# Patient Record
Sex: Male | Born: 1945
Health system: Southern US, Community
[De-identification: ages and names within clinical notes are randomized; demographics above are authoritative.]

## PROBLEM LIST (undated history)

## (undated) DIAGNOSIS — F419 Anxiety disorder, unspecified: Secondary | ICD-10-CM

## (undated) DIAGNOSIS — G4700 Insomnia, unspecified: Secondary | ICD-10-CM

## (undated) DIAGNOSIS — I10 Essential (primary) hypertension: Secondary | ICD-10-CM

## (undated) DIAGNOSIS — I509 Heart failure, unspecified: Secondary | ICD-10-CM

## (undated) DIAGNOSIS — I499 Cardiac arrhythmia, unspecified: Secondary | ICD-10-CM

## (undated) DIAGNOSIS — G4730 Sleep apnea, unspecified: Secondary | ICD-10-CM

## (undated) DIAGNOSIS — E785 Hyperlipidemia, unspecified: Secondary | ICD-10-CM

## (undated) DIAGNOSIS — R0601 Orthopnea: Secondary | ICD-10-CM

## (undated) DIAGNOSIS — E119 Type 2 diabetes mellitus without complications: Secondary | ICD-10-CM

## (undated) DIAGNOSIS — E0590 Thyrotoxicosis, unspecified without thyrotoxic crisis or storm: Secondary | ICD-10-CM

## (undated) DIAGNOSIS — K219 Gastro-esophageal reflux disease without esophagitis: Secondary | ICD-10-CM

## (undated) DIAGNOSIS — Z972 Presence of dental prosthetic device (complete) (partial): Secondary | ICD-10-CM

## (undated) DIAGNOSIS — F32A Depression, unspecified: Secondary | ICD-10-CM

## (undated) DIAGNOSIS — F329 Major depressive disorder, single episode, unspecified: Secondary | ICD-10-CM

## (undated) DIAGNOSIS — G709 Myoneural disorder, unspecified: Secondary | ICD-10-CM

## (undated) DIAGNOSIS — M1990 Unspecified osteoarthritis, unspecified site: Secondary | ICD-10-CM

## (undated) HISTORY — DX: Depression, unspecified: F32.A

## (undated) HISTORY — DX: Major depressive disorder, single episode, unspecified: F32.9

## (undated) HISTORY — DX: Anxiety disorder, unspecified: F41.9

## (undated) HISTORY — DX: Insomnia, unspecified: G47.00

## (undated) HISTORY — DX: Type 2 diabetes mellitus without complications: E11.9

## (undated) HISTORY — DX: Gastro-esophageal reflux disease without esophagitis: K21.9

## (undated) HISTORY — DX: Essential (primary) hypertension: I10

## (undated) HISTORY — PX: TONSILLECTOMY: SUR1361

## (undated) HISTORY — PX: CARDIAC CATHETERIZATION: SHX172

## (undated) HISTORY — DX: Hyperlipidemia, unspecified: E78.5

---

## 2005-07-29 ENCOUNTER — Emergency Department: Payer: Self-pay | Admitting: Emergency Medicine

## 2005-07-29 ENCOUNTER — Other Ambulatory Visit: Payer: Self-pay

## 2005-07-30 IMAGING — CT CT HEAD WITHOUT CONTRAST
2 series · 16 of 30 positions shown, 20 images · non-contrast
Comparison: none

REASON FOR EXAM: Dizziness rm1
COMMENTS:

[Series 2: without · axial · non-contrast · 0.39mm/px · z∈[+144,+270]mm · 13 of 31 slices shown, 17 images]
[im 3/31  brain]
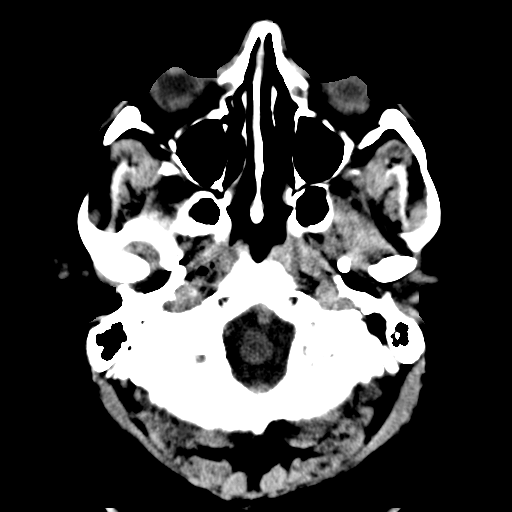
[im 3/31  bone]
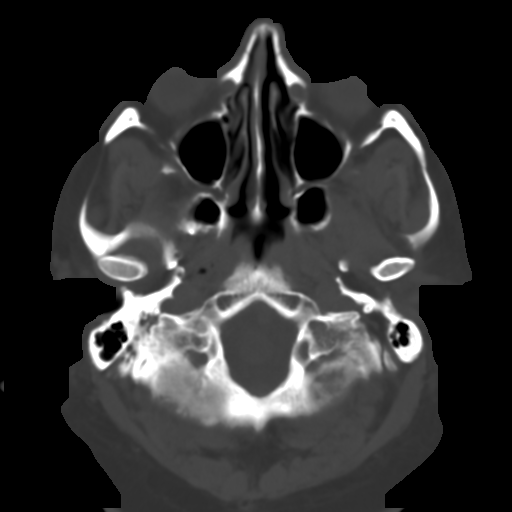
[im 5/31  brain]
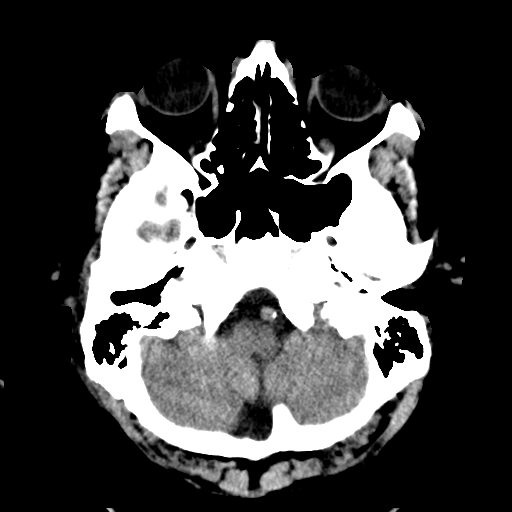
[im 7/31  brain]
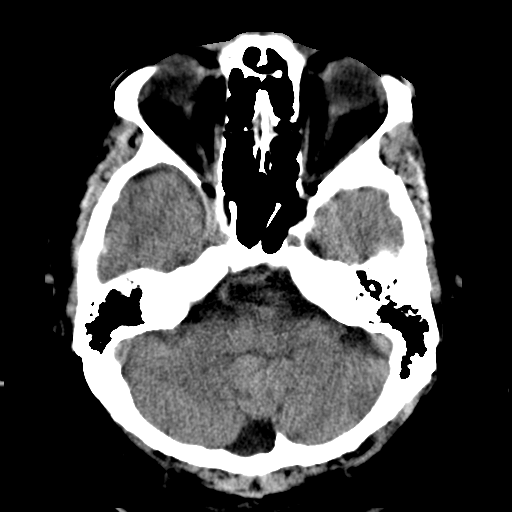
[im 9/31  brain]
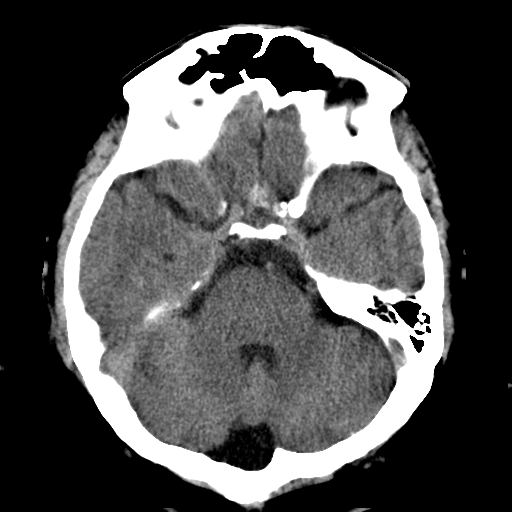
[im 11/31  brain]
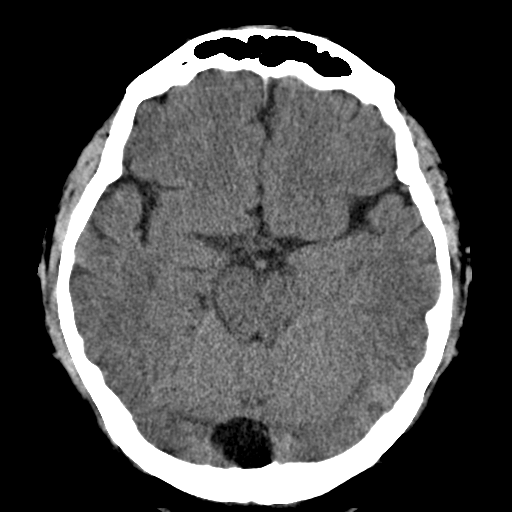
[im 11/31  bone]
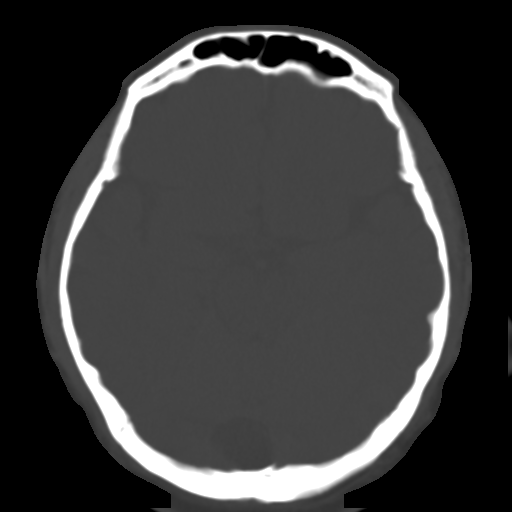
[im 13/31  brain]
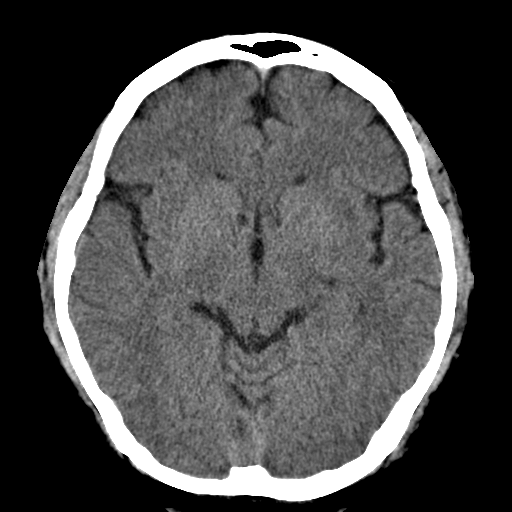
[im 16/31  brain]
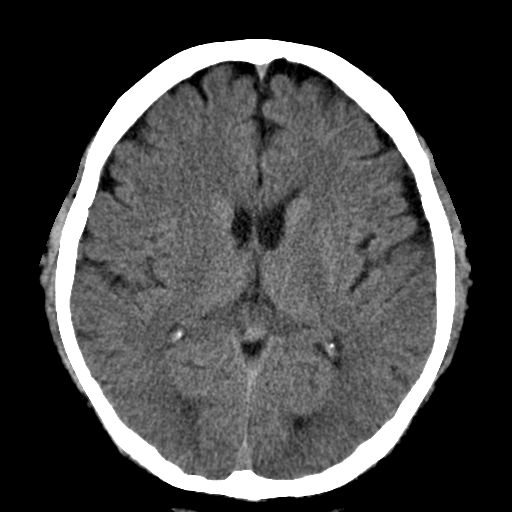
[im 18/31  brain]
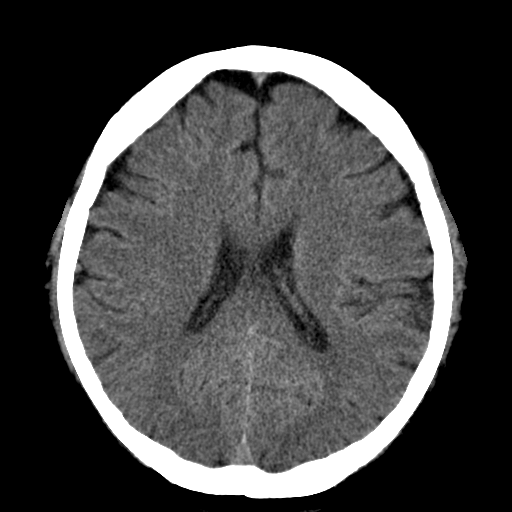
[im 20/31  brain]
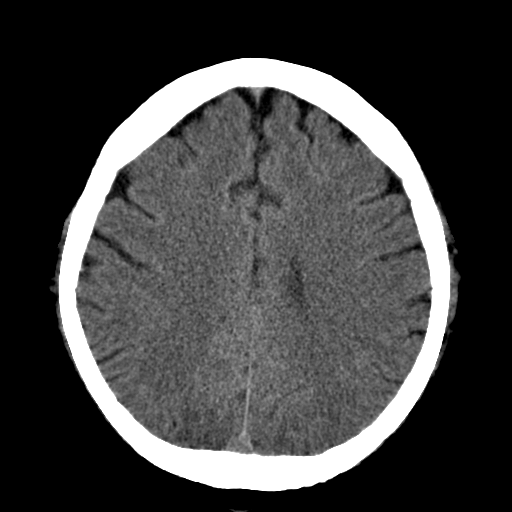
[im 20/31  bone]
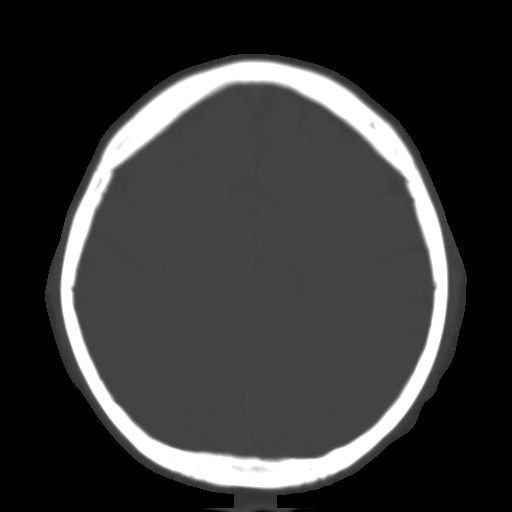
[im 22/31  brain]
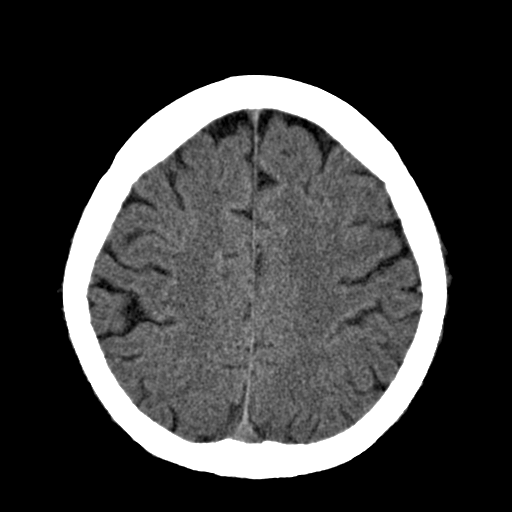
[im 24/31  brain]
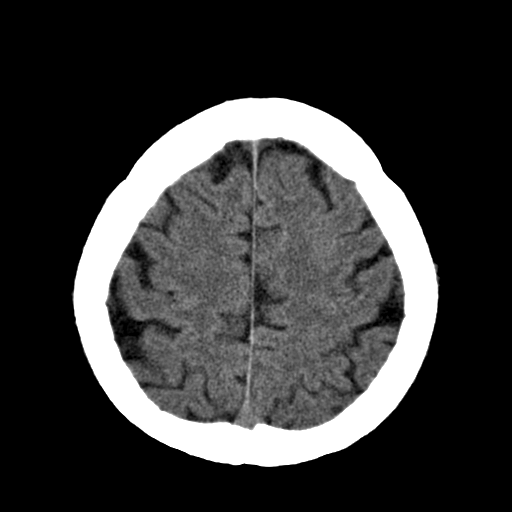
[im 26/31  brain]
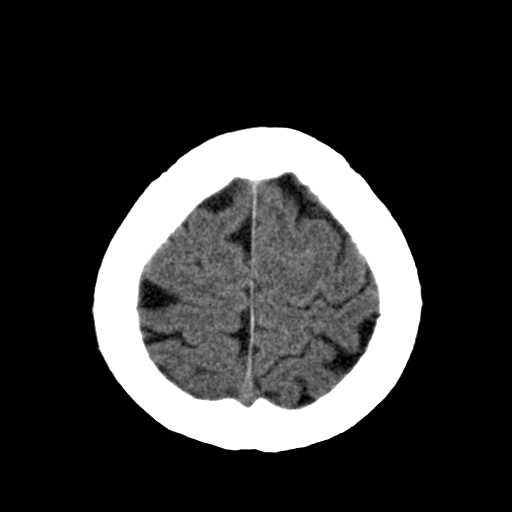
[im 28/31  brain]
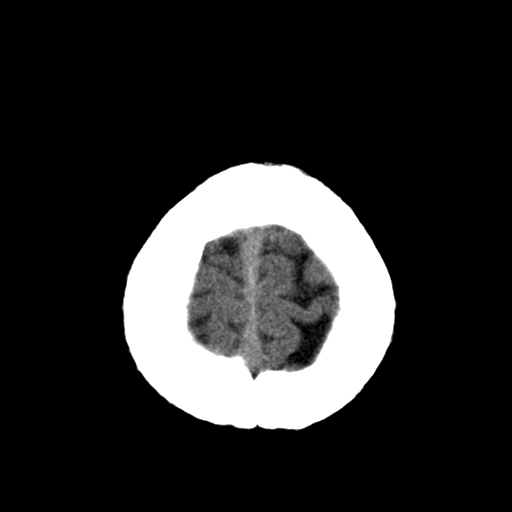
[im 28/31  bone]
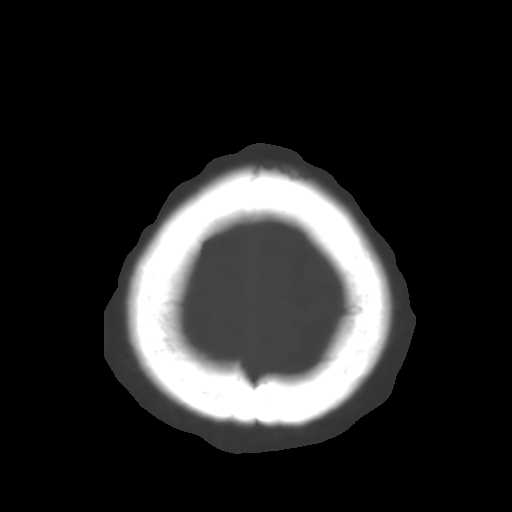

[Series 3: bone · axial · 0.39mm/px · z∈[+144,+184]mm · 3 of 31 slices shown]
[im 3/31  bone]
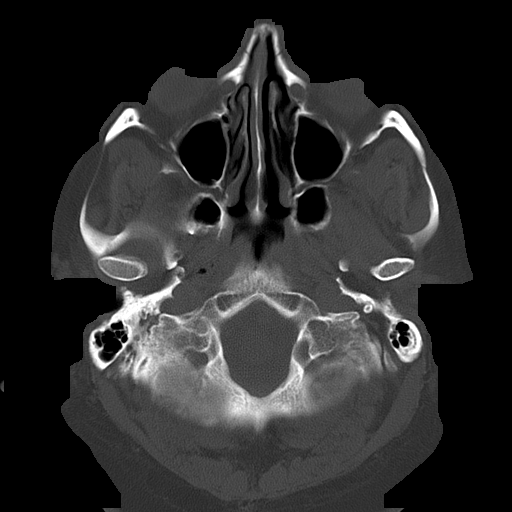
[im 7/31  bone]
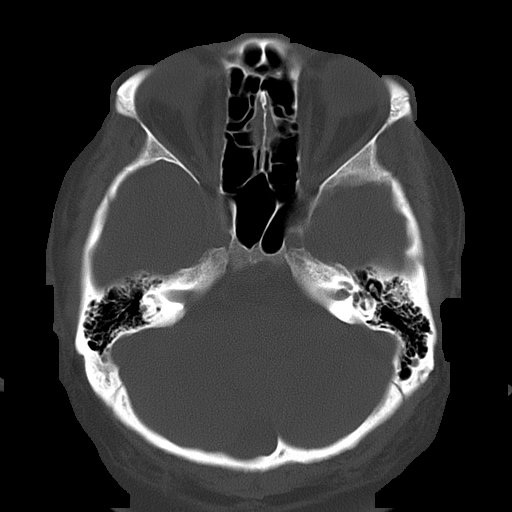
[im 11/31  bone]
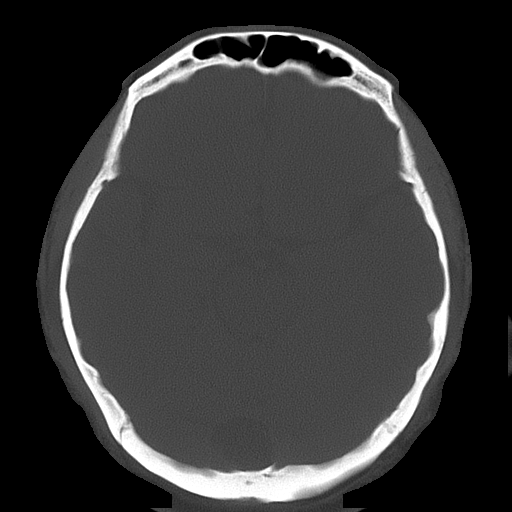

[16 of 30 positions shown; findings below may reference images not displayed]

PROCEDURE:     CT  - CT HEAD WITHOUT CONTRAST  - [DATE]  [DATE]

RESULT:       The patient has unexplained vertigo and hearing loss on the
RIGHT as well as tinnitus.

The ventricles are normal in size and position.  There is a mega cisterna
magna which is a normal variant.   I see no evidence of a mass nor mass
effect.  There is no intracranial hemorrhage.  At bone window settings, the
visualized portions of the paranasal sinuses are normal.  The mastoid air
cells appear well pneumatized.
IMPRESSION: I see no acute intracranial abnormality.   Specific attention to the
cerebellum and brainstem reveals no acute abnormality either.

A preliminary report was sent to the Emergency Room at the conclusion of the
study.

## 2005-08-09 ENCOUNTER — Ambulatory Visit: Payer: Self-pay

## 2006-12-21 IMAGING — US ABDOMEN ULTRASOUND
1 series · 17 of 25 positions shown · non-contrast
Comparison: none

REASON FOR EXAM: Epigastric pain
COMMENTS:

[Series 1: abdomen ultrasound · 17 of 63 slices shown]
[im 1/63]
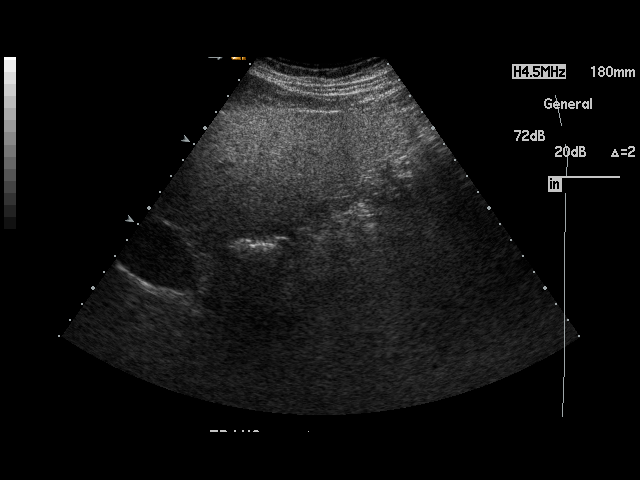
[im 6/63]
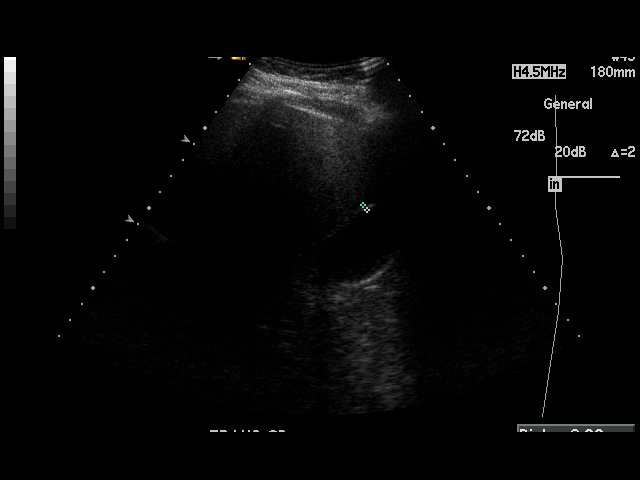
[im 8/63]
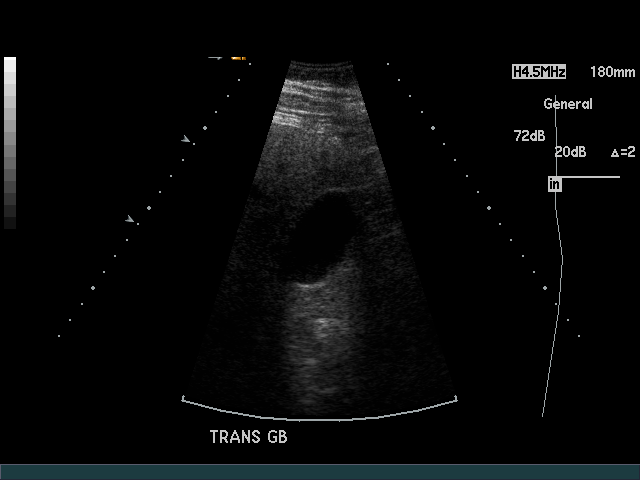
[im 13/63]
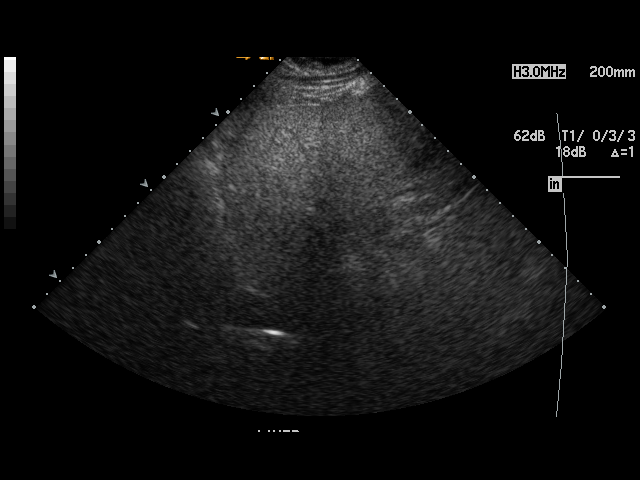
[im 16/63]
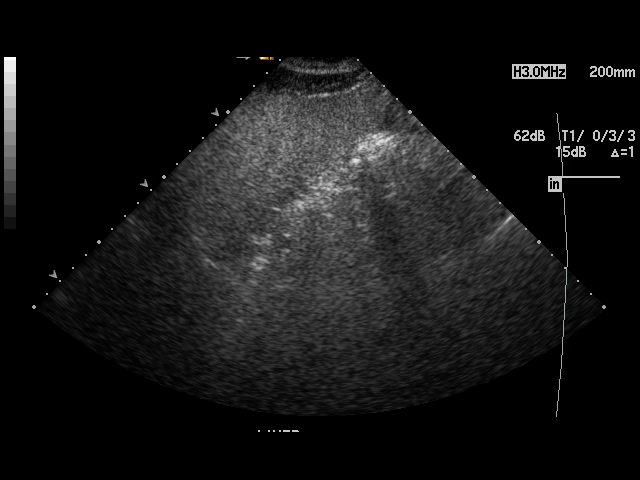
[im 21/63]
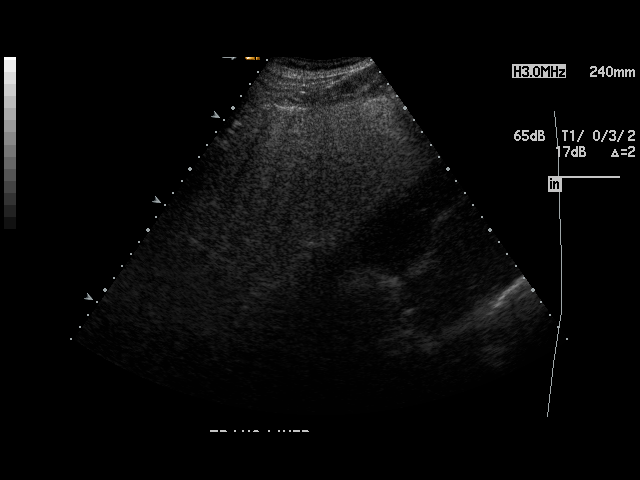
[im 24/63]
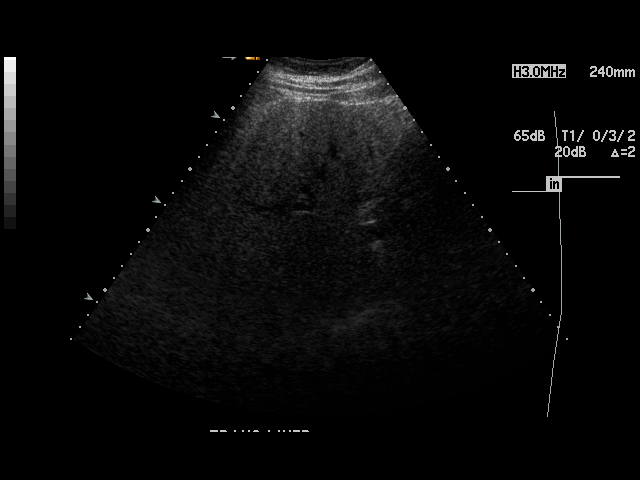
[im 29/63]
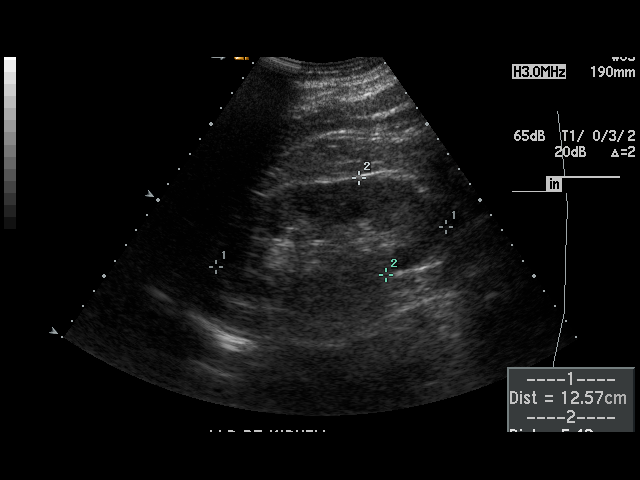
[im 32/63]
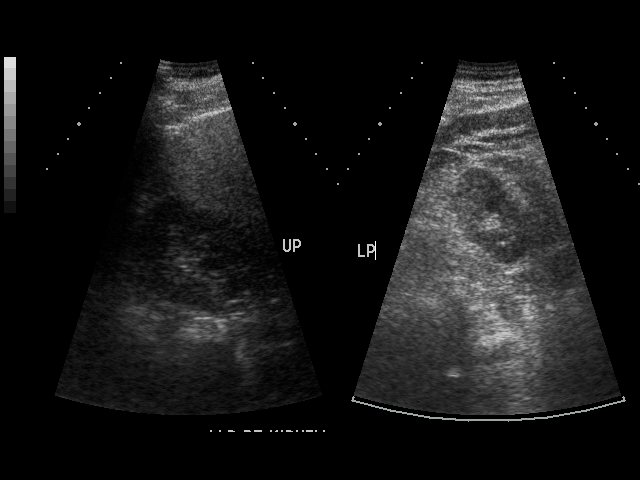
[im 34/63]
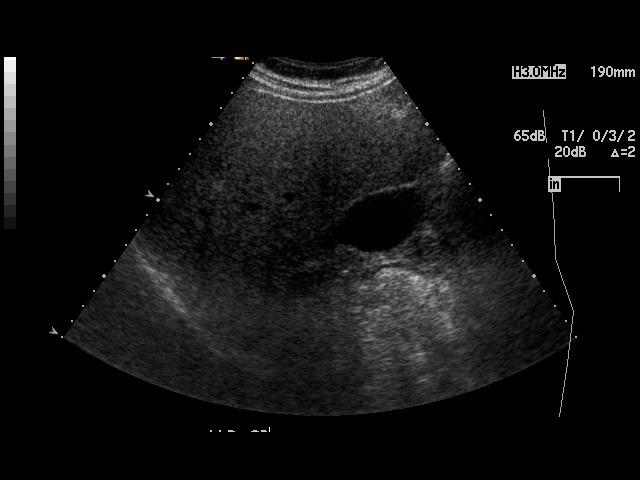
[im 39/63]
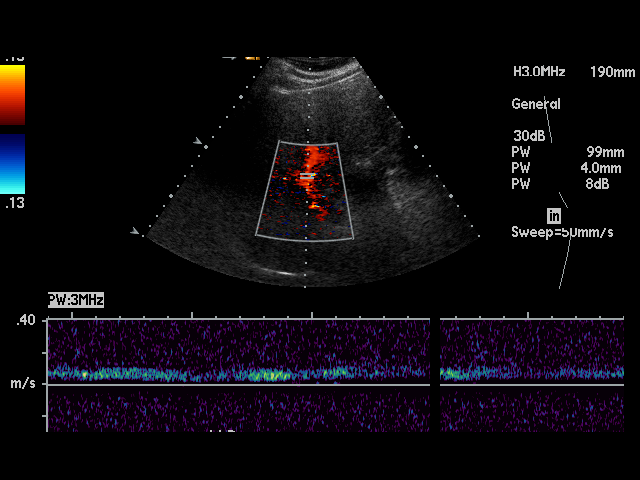
[im 42/63]
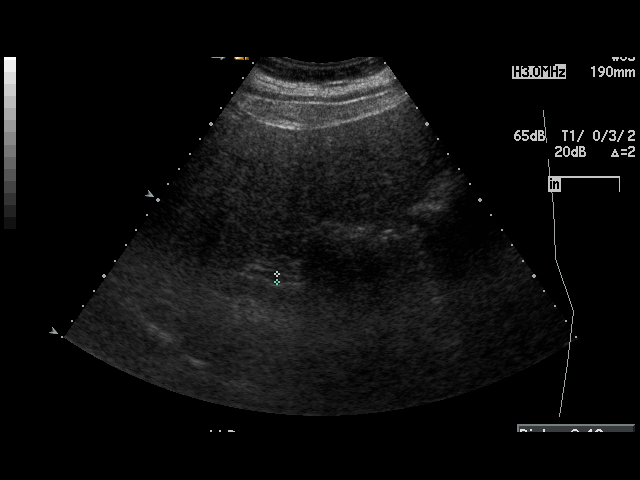
[im 47/63]
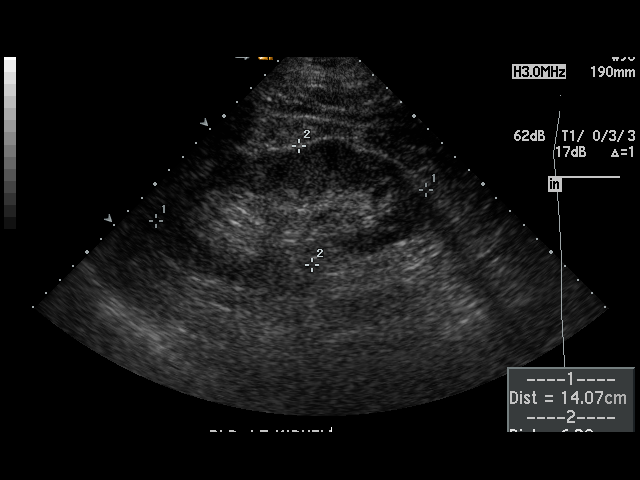
[im 50/63]
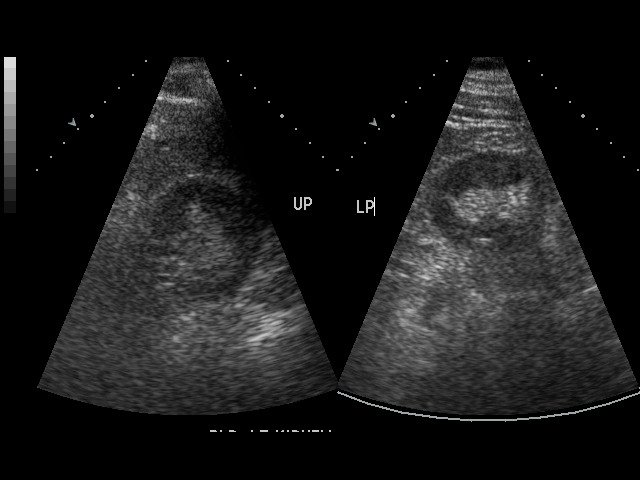
[im 55/63]
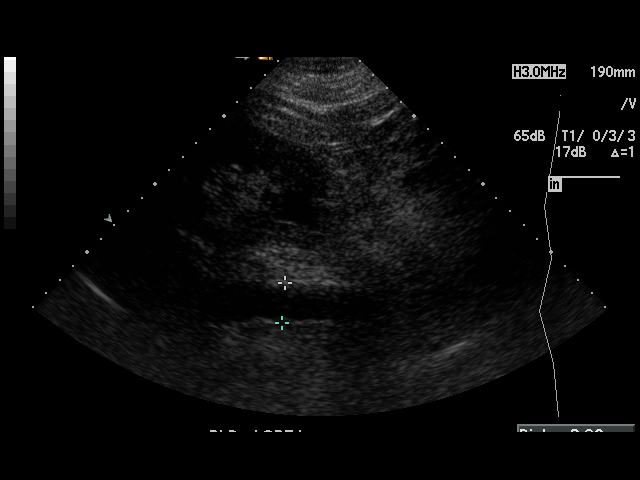
[im 57/63]
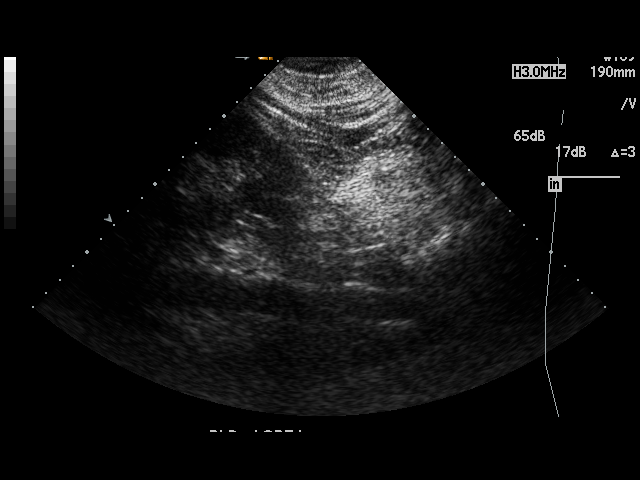
[im 63/63]
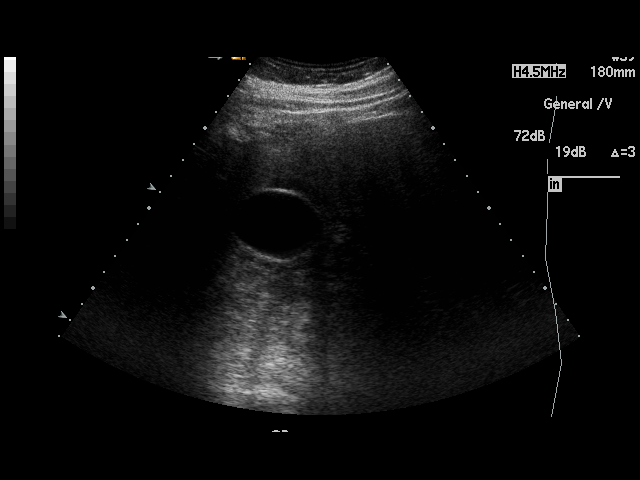

[17 of 25 positions shown; findings below may reference images not displayed]

PROCEDURE:     US  - US ABDOMEN GENERAL SURVEY  - [DATE]  [DATE]

RESULT:     The liver is dense, suspicious for fatty infiltration. No focal
hepatic mass lesions are seen. Spleen size is normal. The pancreas is not
visualized adequately for evaluation on this exam. No gallstones are seen.
No thickening of the gallbladder wall is noted. The common bile duct
measures 5.1 mm in diameter which is within normal limits. The kidneys show
no hydronephrosis. There is no ascites.
IMPRESSION: 1. Possible fatty infiltration the liver.
2. No gallstones or other acute changes identified.

## 2006-12-22 ENCOUNTER — Ambulatory Visit: Payer: Self-pay

## 2011-03-05 LAB — HM COLONOSCOPY: HM COLON: NORMAL

## 2013-08-15 ENCOUNTER — Ambulatory Visit: Payer: Self-pay | Admitting: Family Medicine

## 2013-08-15 IMAGING — CR RIGHT KNEE - 3 VIEW
1 series · 4 of 4 positions shown · non-contrast
Comparison: None.

CLINICAL DATA: Right knee pain

EXAM:
RIGHT KNEE - 3 VIEW

[Series 1: ap · 0.17mm/px · 4 of 4 slices shown]
[im 1/4]
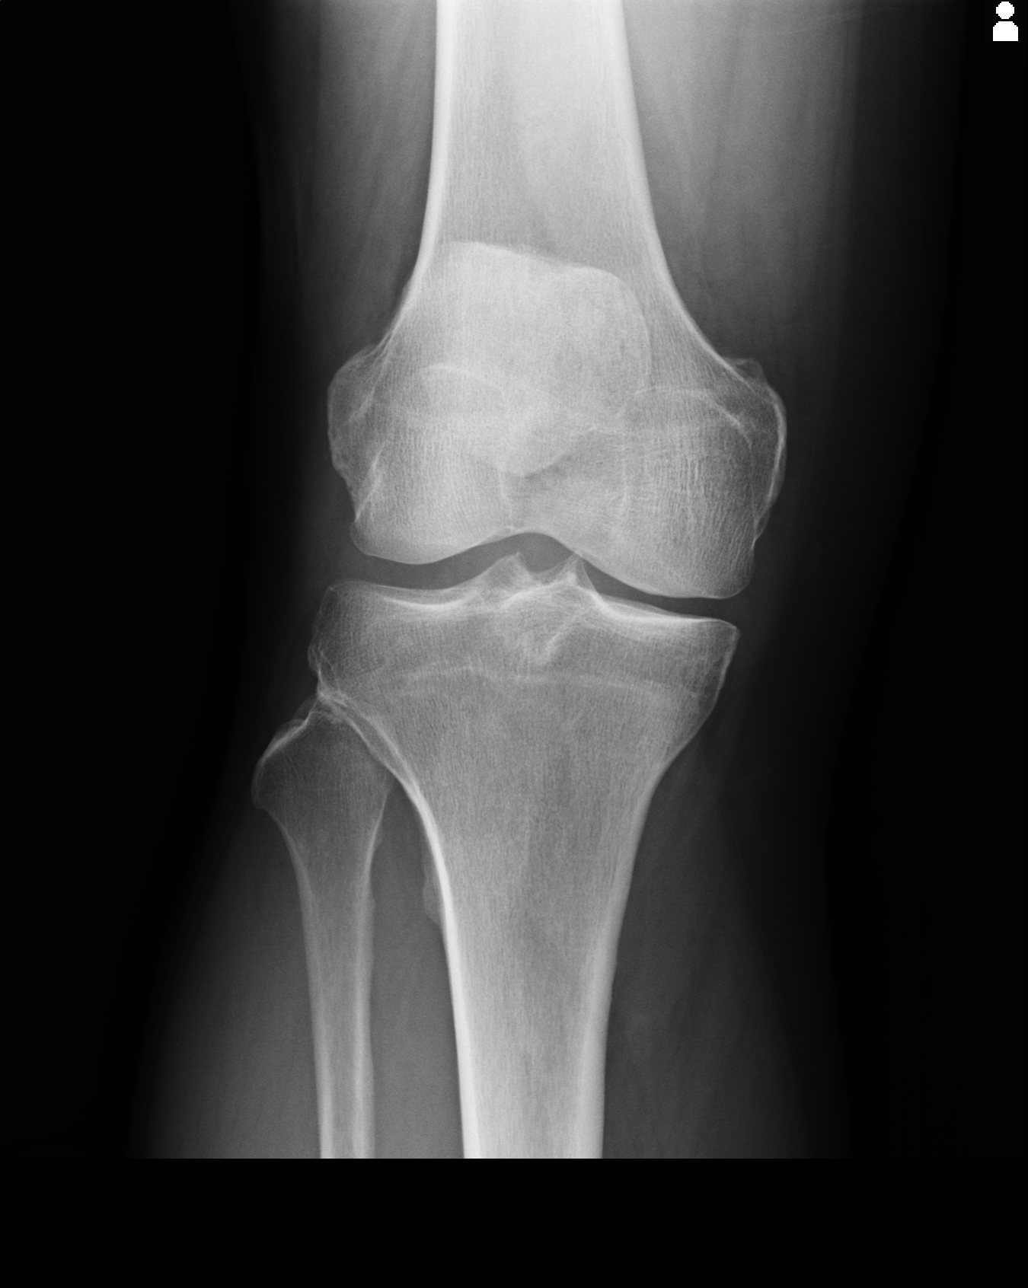
[im 2/4]
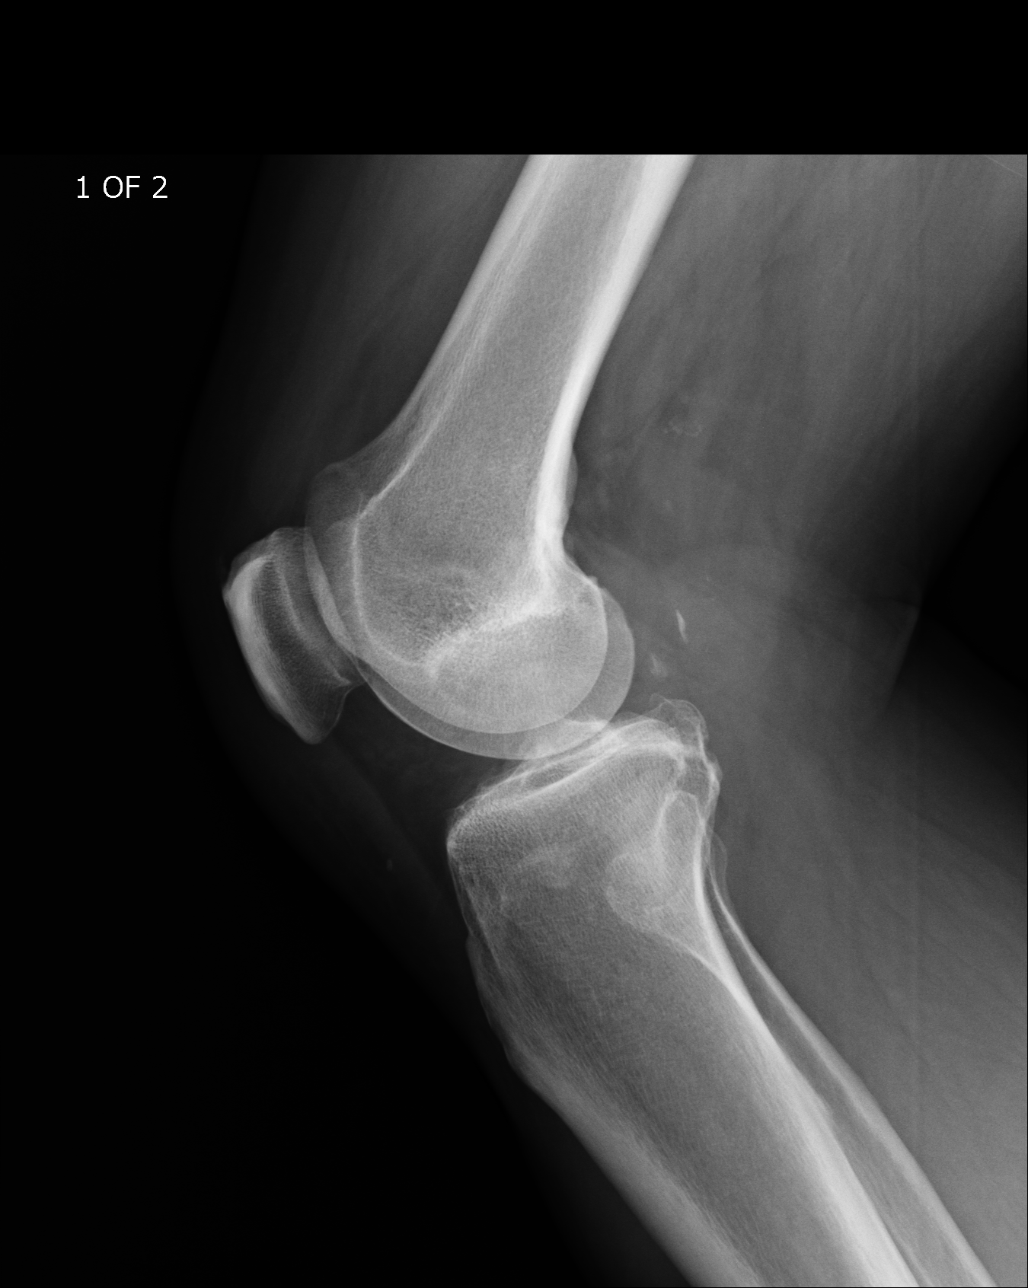
[im 3/4]
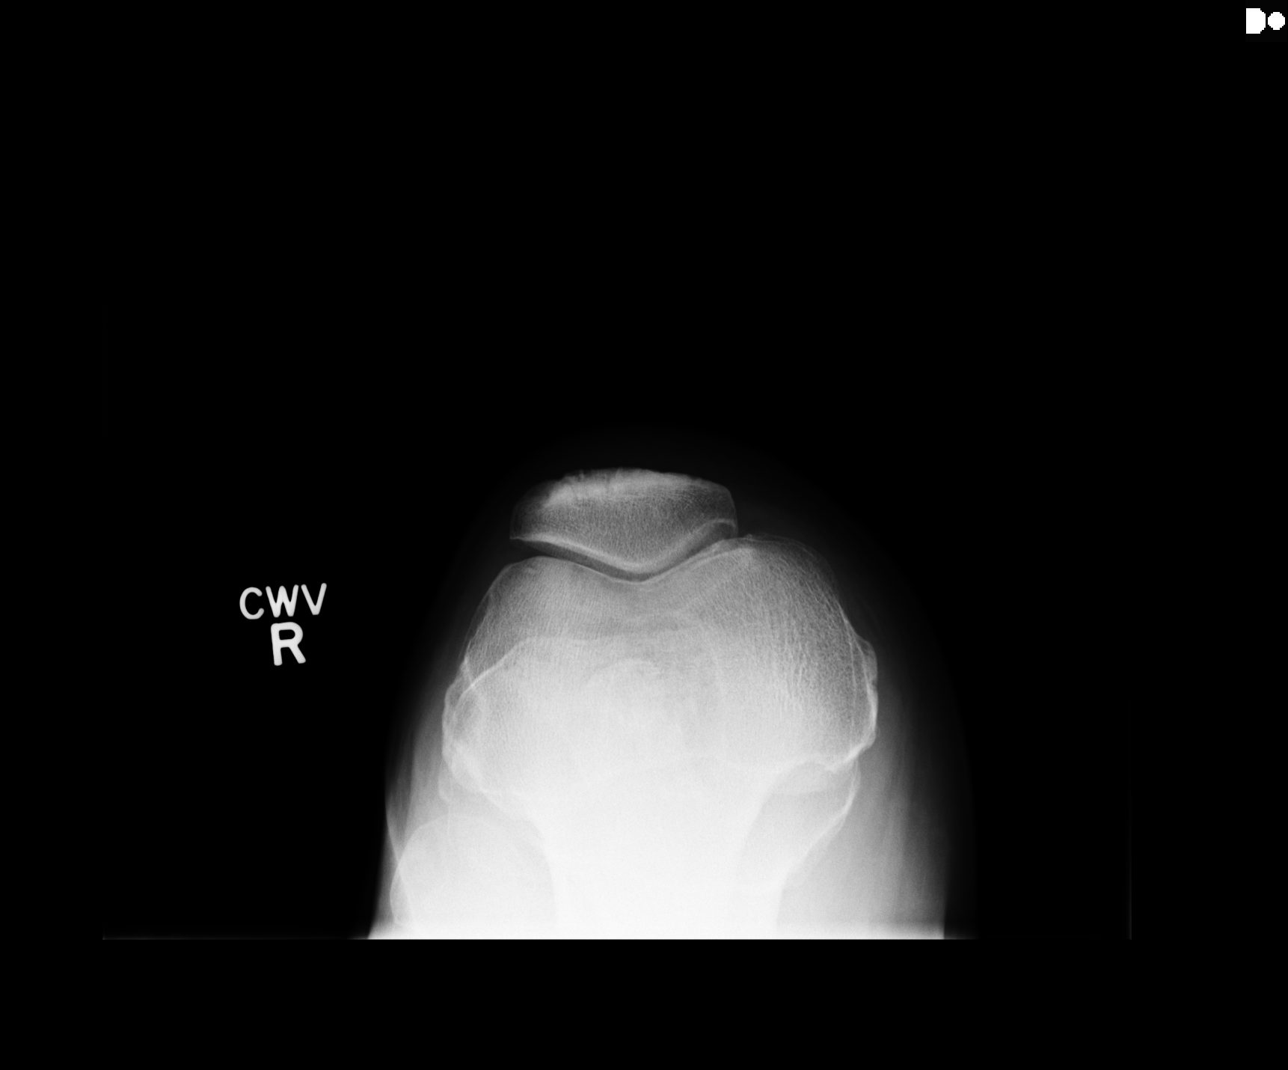
[im 4/4]
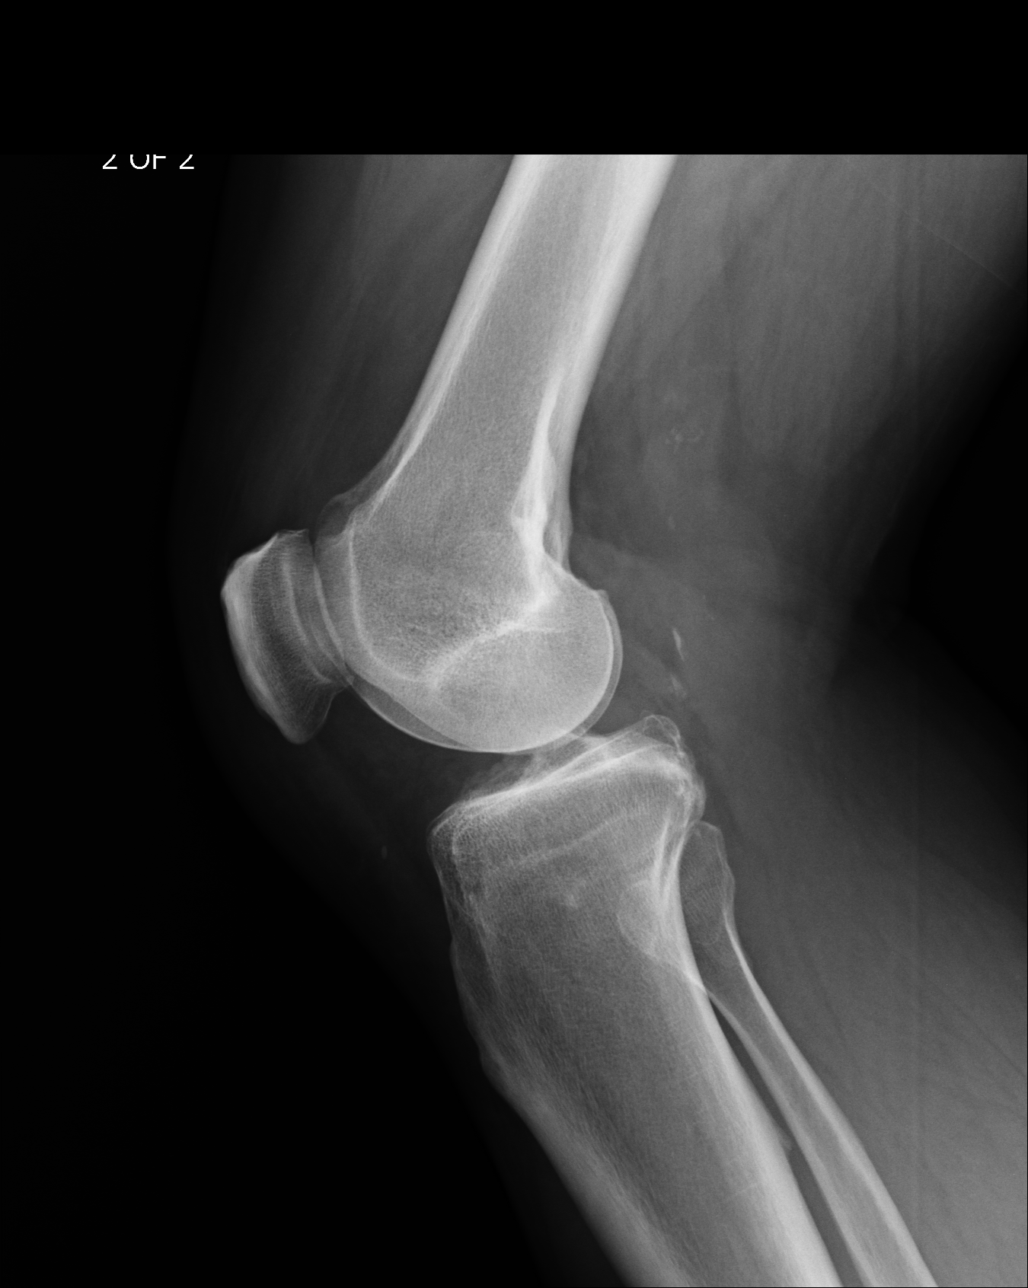

[4 of 4 positions shown; findings below may reference images not displayed]

FINDINGS: No fracture dislocation right knee. No joint effusion. Joint spaces
are well maintained. Minimal osteophytosis of the patella. .
IMPRESSION: No acute findings of the right knee. No effusion. Minimal
arthropathy.

## 2014-01-01 ENCOUNTER — Emergency Department: Payer: Self-pay | Admitting: Emergency Medicine

## 2014-01-01 LAB — BASIC METABOLIC PANEL
ANION GAP: 13 (ref 7–16)
BUN: 25 mg/dL — ABNORMAL HIGH (ref 7–18)
CREATININE: 1.47 mg/dL — AB (ref 0.60–1.30)
Calcium, Total: 8.9 mg/dL (ref 8.5–10.1)
Chloride: 99 mmol/L (ref 98–107)
Co2: 23 mmol/L (ref 21–32)
EGFR (African American): 57 — ABNORMAL LOW
EGFR (Non-African Amer.): 49 — ABNORMAL LOW
GLUCOSE: 235 mg/dL — AB (ref 65–99)
Osmolality: 282 (ref 275–301)
Potassium: 3 mmol/L — ABNORMAL LOW (ref 3.5–5.1)
Sodium: 135 mmol/L — ABNORMAL LOW (ref 136–145)

## 2014-01-01 LAB — URINALYSIS, COMPLETE
Bacteria: NONE SEEN
Bilirubin,UR: NEGATIVE
Blood: NEGATIVE
Glucose,UR: >=500 mg/dL (ref 0–75)
Hyaline Cast: 3
LEUKOCYTE ESTERASE: NEGATIVE
NITRITE: NEGATIVE
Ph: 5 (ref 4.5–8.0)
Protein: 30
RBC, UR: NONE SEEN /HPF (ref 0–5)
Specific Gravity: 1.013 (ref 1.003–1.030)
WBC UR: NONE SEEN /HPF (ref 0–5)

## 2014-01-01 LAB — CBC
HCT: 46.9 % (ref 40.0–52.0)
HGB: 15.8 g/dL (ref 13.0–18.0)
MCH: 29.9 pg (ref 26.0–34.0)
MCHC: 33.8 g/dL (ref 32.0–36.0)
MCV: 89 fL (ref 80–100)
Platelet: 144 10*3/uL — ABNORMAL LOW (ref 150–440)
RBC: 5.28 10*6/uL (ref 4.40–5.90)
RDW: 13.3 % (ref 11.5–14.5)
WBC: 9.3 10*3/uL (ref 3.8–10.6)

## 2014-01-01 LAB — TROPONIN I: Troponin-I: <0.02 ng/mL

## 2014-01-01 LAB — MAGNESIUM: Magnesium: 1.2 mg/dL — ABNORMAL LOW

## 2014-01-01 LAB — D-DIMER(ARMC): D-DIMER: 418 ng/mL

## 2014-01-01 IMAGING — CR DG CHEST 1V PORT
1 series · 1 of 1 positions shown · non-contrast
Comparison: None.

CLINICAL DATA: Chest pain

EXAM:
PORTABLE CHEST - 1 VIEW

[ap]
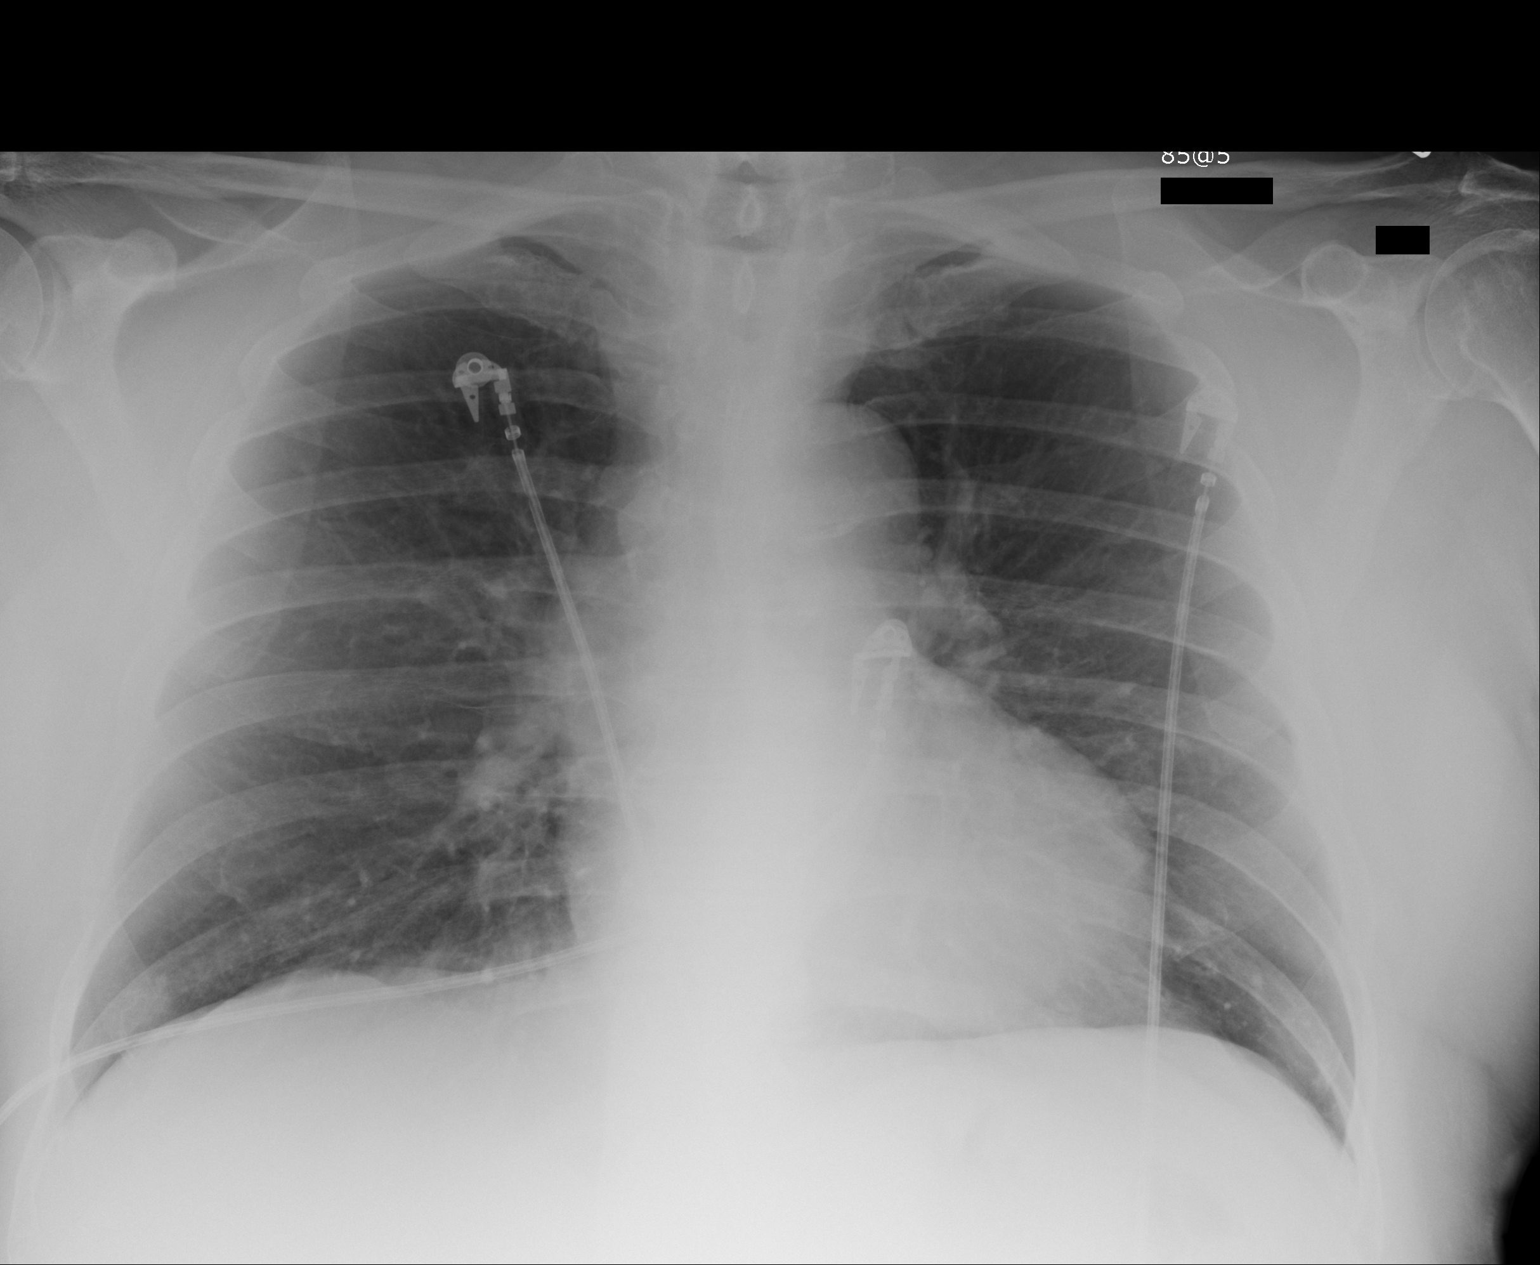

[1 of 1 positions shown; findings below may reference images not displayed]

FINDINGS: The heart size and mediastinal contours are within normal limits.
Both lungs are clear. The visualized skeletal structures are
unremarkable.
IMPRESSION: No active disease.

## 2014-09-26 ENCOUNTER — Ambulatory Visit: Payer: Self-pay | Admitting: Family Medicine

## 2014-09-26 IMAGING — US ABDOMEN ULTRASOUND
1 series · 14 of 25 positions shown · non-contrast
Comparison: None.

CLINICAL DATA: Right upper quadrant pain. Nausea, vomiting.
Symptoms since [REDACTED].

EXAM:
ULTRASOUND ABDOMEN COMPLETE

[Series 1: abdomen ultrasound · 0.31mm/px · 14 of 103 slices shown]
[im 1/103]
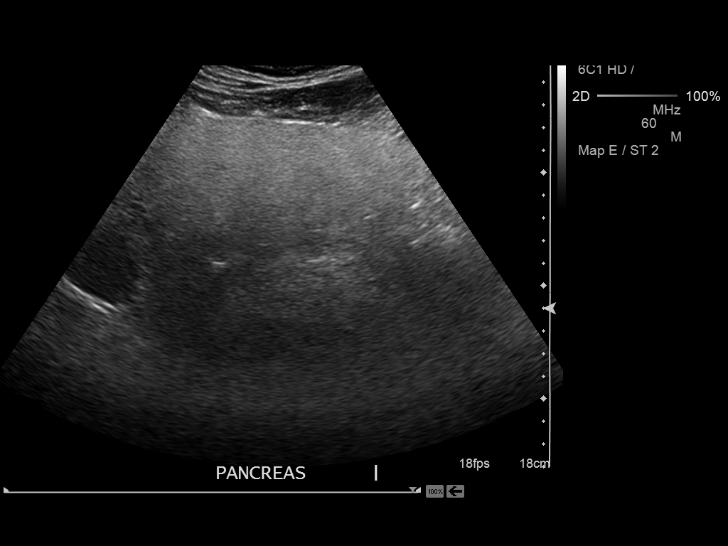
[im 9/103]
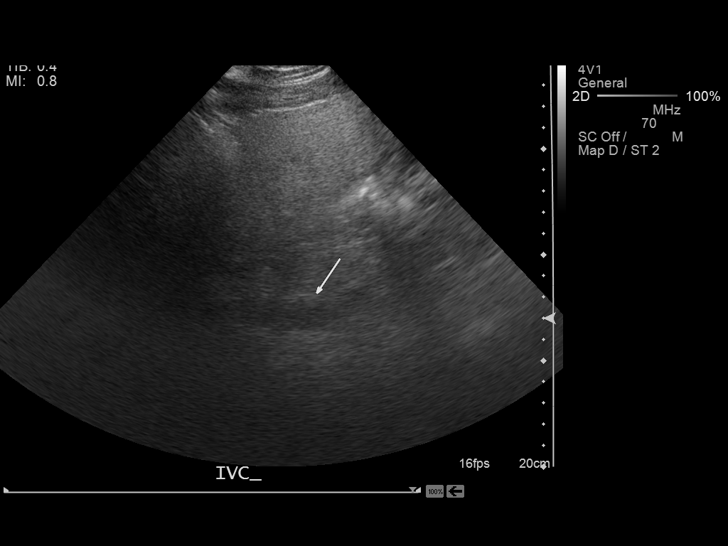
[im 18/103]
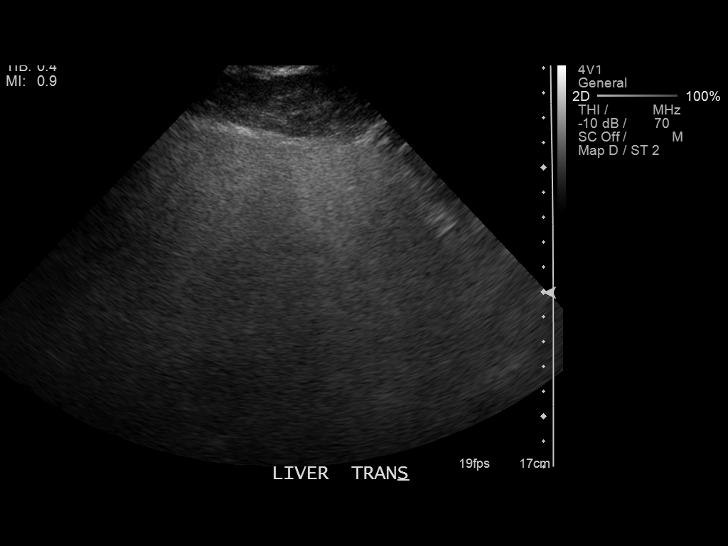
[im 26/103]
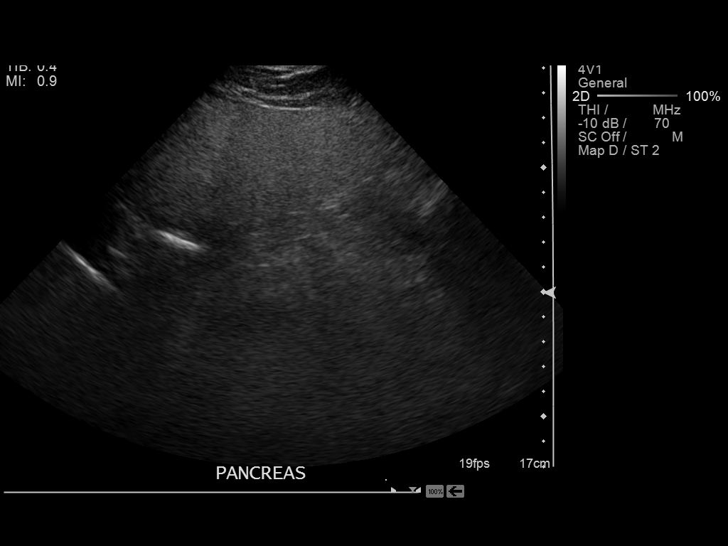
[im 35/103]
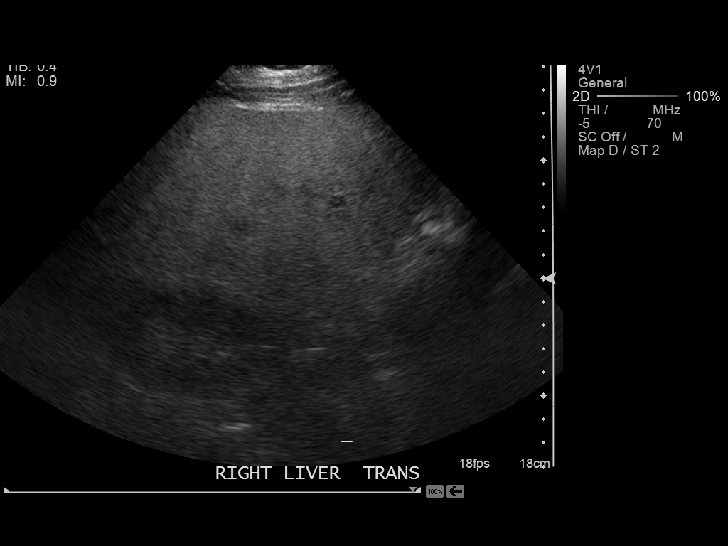
[im 39/103]
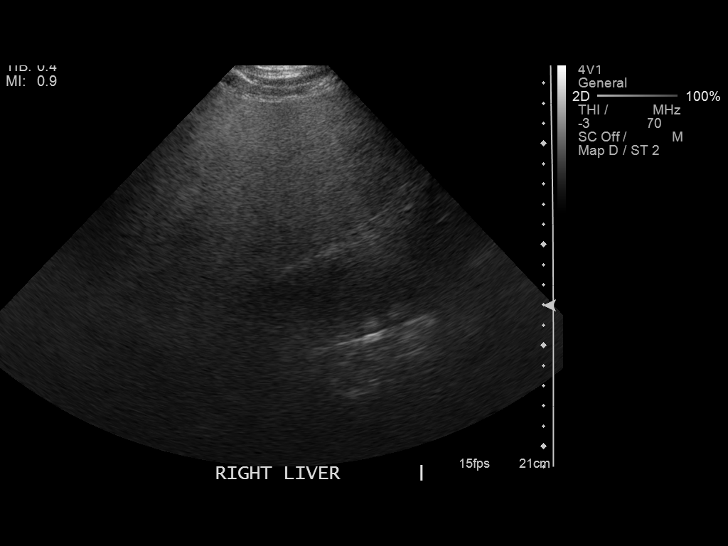
[im 47/103]
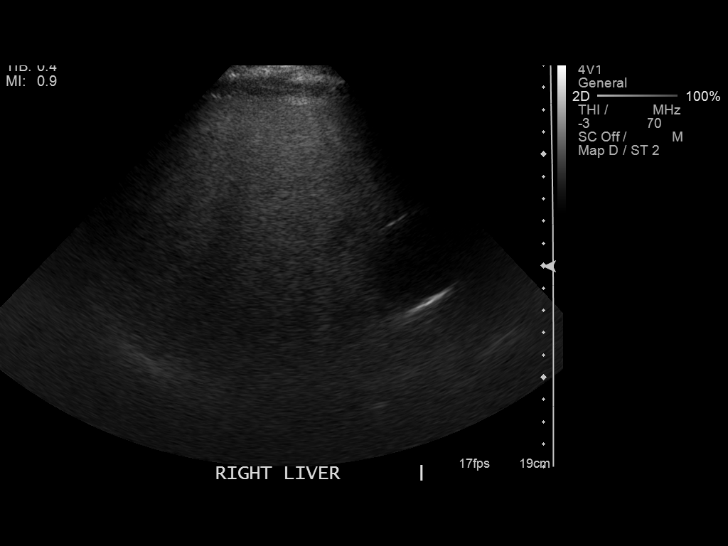
[im 56/103]
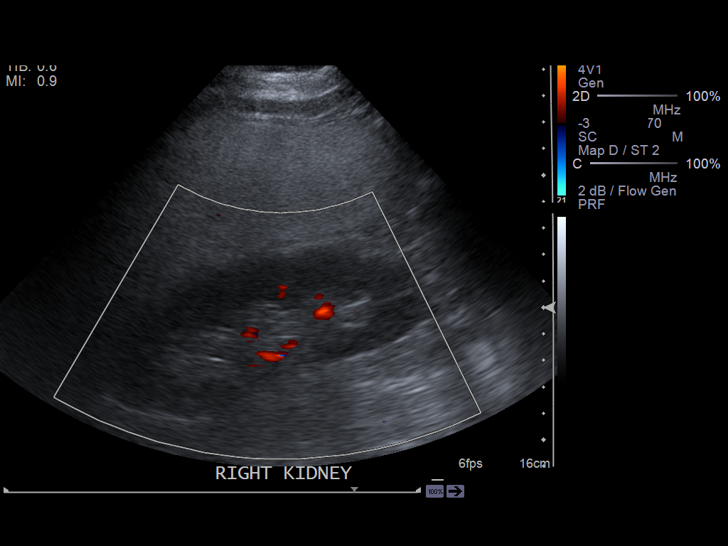
[im 64/103]
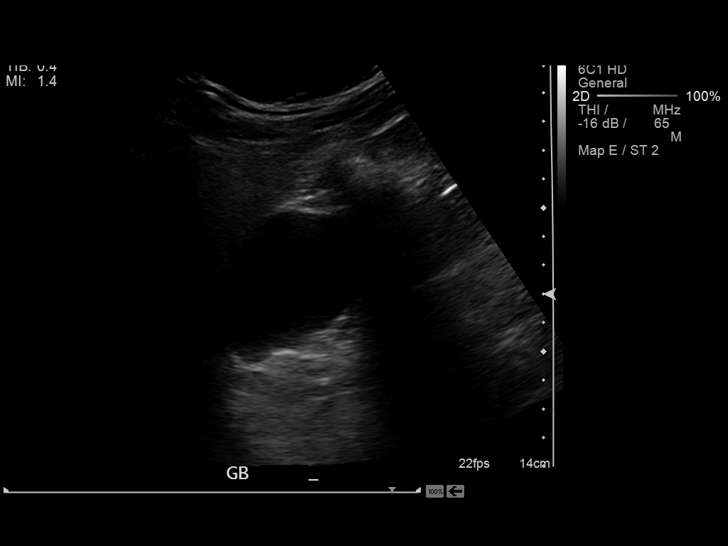
[im 69/103]
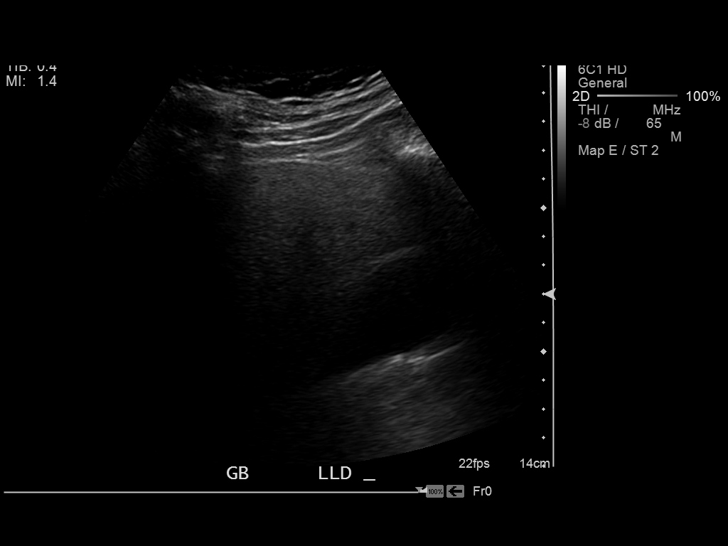
[im 77/103]
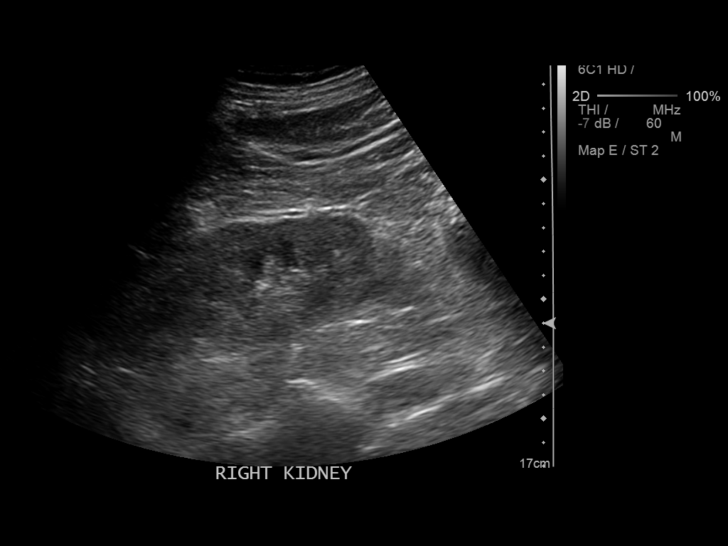
[im 86/103]
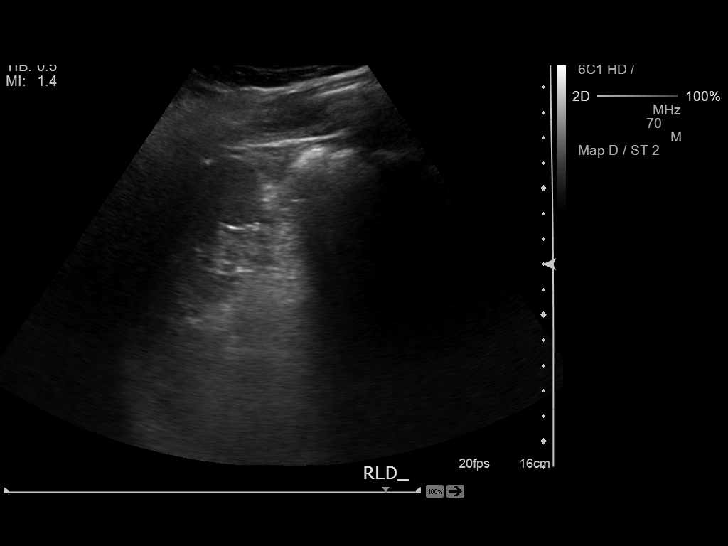
[im 94/103]
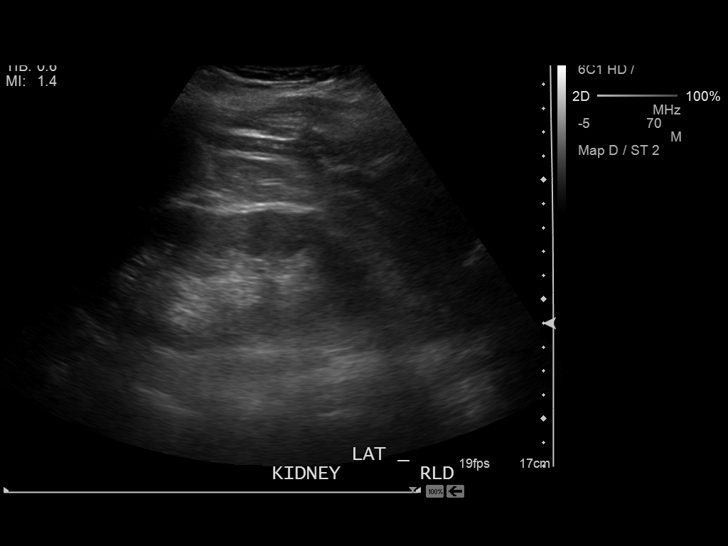
[im 103/103]
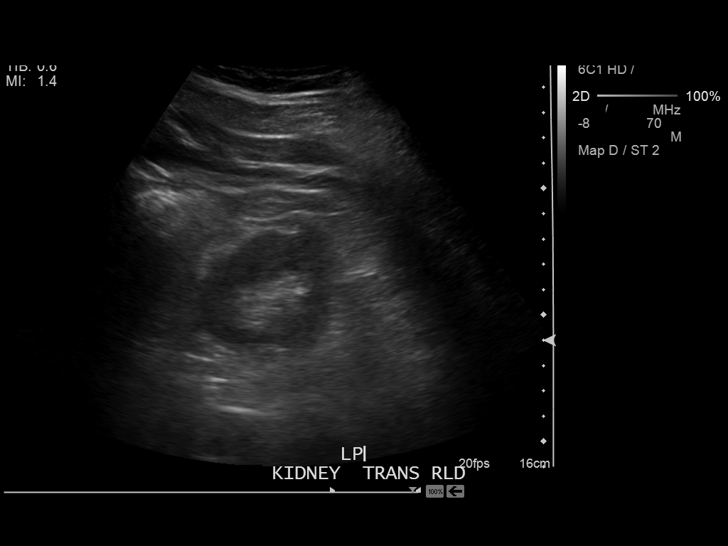

[14 of 25 positions shown; findings below may reference images not displayed]

FINDINGS: Gallbladder: No gallstones or wall thickening visualized. No
sonographic Murphy sign noted.

Common bile duct: Diameter: Difficult to visualize. Visualized
portion is normal caliber, 5 mm.

Liver: Increased echotexture compatible with fatty infiltration. No
visible focal abnormality

IVC: No abnormality visualized.

Pancreas: Visualized portion unremarkable.

Spleen: Size and appearance within normal limits.

Right Kidney: Length: 13.3 cm. Echogenicity within normal limits. No
mass or hydronephrosis visualized.

Left Kidney: Length: 13.5 cm. Echogenicity within normal limits. No
mass or hydronephrosis visualized.

Abdominal aorta: No aneurysm visualized.

Other findings: None.
IMPRESSION: Unremarkable ultrasound.

## 2015-01-31 LAB — LIPID PANEL
Cholesterol: 156 mg/dL (ref 0–200)
HDL: 50 mg/dL (ref 35–70)
LDL CALC: 72 mg/dL
Triglycerides: 170 mg/dL — AB (ref 40–160)

## 2015-01-31 LAB — BASIC METABOLIC PANEL
BUN: 20 mg/dL (ref 4–21)
Creatinine: 1 mg/dL (ref 0.6–1.3)
GLUCOSE: 220 mg/dL
Potassium: 4.7 mmol/L (ref 3.4–5.3)
Sodium: 139 mmol/L (ref 137–147)

## 2015-01-31 LAB — HEMOGLOBIN A1C: HEMOGLOBIN A1C: 9.3 % — AB (ref 4.0–6.0)

## 2015-01-31 LAB — PSA: PSA: 2

## 2015-01-31 LAB — HEPATIC FUNCTION PANEL
ALT: 28 U/L (ref 10–40)
AST: 24 U/L (ref 14–40)

## 2015-03-27 ENCOUNTER — Ambulatory Visit: Payer: Self-pay | Admitting: Family Medicine

## 2015-05-16 ENCOUNTER — Other Ambulatory Visit: Payer: Self-pay | Admitting: Family Medicine

## 2015-05-16 DIAGNOSIS — E119 Type 2 diabetes mellitus without complications: Secondary | ICD-10-CM

## 2015-05-16 MED ORDER — METFORMIN HCL 1000 MG PO TABS: 1000.0000 mg | ORAL_TABLET | Freq: Two times a day (BID) | ORAL | Status: DC

## 2015-05-16 MED ORDER — PIOGLITAZONE HCL 30 MG PO TABS: 30.0000 mg | ORAL_TABLET | Freq: Every day | ORAL | Status: DC

## 2015-05-16 NOTE — Telephone Encounter (Signed)
LMTCB  Thanks,  -Joseline 

## 2015-05-16 NOTE — Telephone Encounter (Signed)
Prescription refilled. His diabetes doctor should be taking over his diabetes medication and refills in the future.

## 2015-05-16 NOTE — Telephone Encounter (Signed)
Patient called off requesting refill for Pioglitazone HCI 30mg . Patient states that his pharmacy sent over request earlier this week and he has not heard back. Patient was last prescribed Rx on 10/29/14, patient request that rx be sent to [facility] on [address]. KW

## 2015-05-19 ENCOUNTER — Other Ambulatory Visit: Payer: Self-pay | Admitting: Family Medicine

## 2015-05-19 DIAGNOSIS — F329 Major depressive disorder, single episode, unspecified: Secondary | ICD-10-CM

## 2015-05-19 DIAGNOSIS — F419 Anxiety disorder, unspecified: Secondary | ICD-10-CM

## 2015-05-19 DIAGNOSIS — R790 Abnormal level of blood mineral: Secondary | ICD-10-CM

## 2015-05-19 DIAGNOSIS — F32A Depression, unspecified: Secondary | ICD-10-CM

## 2015-05-19 MED ORDER — SERTRALINE HCL 50 MG PO TABS: 50.0000 mg | ORAL_TABLET | Freq: Every day | ORAL | Status: DC

## 2015-05-19 MED ORDER — ALPRAZOLAM 0.5 MG PO TABS: 0.5000 mg | ORAL_TABLET | Freq: Three times a day (TID) | ORAL | Status: DC | PRN

## 2015-05-19 MED ORDER — MAGNESIUM OXIDE 400 MG PO CAPS: 400.0000 mg | ORAL_CAPSULE | Freq: Two times a day (BID) | ORAL | Status: DC

## 2015-05-19 NOTE — Telephone Encounter (Signed)
I have printed out the Xanax. If he can't pick it up we can try to call it in.

## 2015-05-19 NOTE — Telephone Encounter (Signed)
Patient has been notified about prescriptions

## 2015-05-19 NOTE — Telephone Encounter (Signed)
Patient walked in office and was advised, he is also requesting refills on the following medications.  1) Sertraline HCL 50mg  2) Mag oxide 400mg  3) Alprazolam 0.5mg  Patient request prescriptions be sent to [facility] on [address], contact number to reach patient is [phone]

## 2015-05-20 ENCOUNTER — Encounter: Payer: Self-pay | Admitting: Family Medicine

## 2015-05-20 ENCOUNTER — Ambulatory Visit (INDEPENDENT_AMBULATORY_CARE_PROVIDER_SITE_OTHER): Payer: Medicare Other | Admitting: Family Medicine

## 2015-05-20 VITALS — BP 94/66 | HR 70 | Temp 97.4°F | Resp 16 | Wt 226.2 lb

## 2015-05-20 DIAGNOSIS — M1710 Unilateral primary osteoarthritis, unspecified knee: Secondary | ICD-10-CM | POA: Insufficient documentation

## 2015-05-20 DIAGNOSIS — R0602 Shortness of breath: Secondary | ICD-10-CM | POA: Insufficient documentation

## 2015-05-20 DIAGNOSIS — F32A Depression, unspecified: Secondary | ICD-10-CM

## 2015-05-20 DIAGNOSIS — K219 Gastro-esophageal reflux disease without esophagitis: Secondary | ICD-10-CM

## 2015-05-20 DIAGNOSIS — F419 Anxiety disorder, unspecified: Secondary | ICD-10-CM | POA: Insufficient documentation

## 2015-05-20 DIAGNOSIS — R519 Headache, unspecified: Secondary | ICD-10-CM

## 2015-05-20 DIAGNOSIS — Z794 Long term (current) use of insulin: Secondary | ICD-10-CM | POA: Insufficient documentation

## 2015-05-20 DIAGNOSIS — E119 Type 2 diabetes mellitus without complications: Secondary | ICD-10-CM

## 2015-05-20 DIAGNOSIS — K853 Drug induced acute pancreatitis without necrosis or infection: Secondary | ICD-10-CM

## 2015-05-20 DIAGNOSIS — F329 Major depressive disorder, single episode, unspecified: Secondary | ICD-10-CM | POA: Insufficient documentation

## 2015-05-20 DIAGNOSIS — M1712 Unilateral primary osteoarthritis, left knee: Secondary | ICD-10-CM

## 2015-05-20 DIAGNOSIS — R7989 Other specified abnormal findings of blood chemistry: Secondary | ICD-10-CM

## 2015-05-20 DIAGNOSIS — R51 Headache: Secondary | ICD-10-CM

## 2015-05-20 DIAGNOSIS — N529 Male erectile dysfunction, unspecified: Secondary | ICD-10-CM

## 2015-05-20 DIAGNOSIS — I10 Essential (primary) hypertension: Secondary | ICD-10-CM

## 2015-05-20 DIAGNOSIS — G4700 Insomnia, unspecified: Secondary | ICD-10-CM

## 2015-05-20 DIAGNOSIS — E785 Hyperlipidemia, unspecified: Secondary | ICD-10-CM

## 2015-05-20 DIAGNOSIS — M179 Osteoarthritis of knee, unspecified: Secondary | ICD-10-CM | POA: Insufficient documentation

## 2015-05-20 DIAGNOSIS — E782 Mixed hyperlipidemia: Secondary | ICD-10-CM | POA: Insufficient documentation

## 2015-05-20 NOTE — Progress Notes (Signed)
Subjective:     Patient ID: Tom Moore, male   DOB: November 17, 1945, 69 y.o.   MRN: [id number]  HPI  Chief Complaint  Patient presents with  . Cough    Patient comes in office today with concerns of cough fore the past 3 weeks. Patient states that cough began when he traveled to [place].   States  That the cough resolved with the use of antihistamines. Since he returned from [place] he has developed a tickle at night with subsequent dry cough. Denies cold sx or feeling sick.Reports compliance with omeprazole taken at night for GERD.   Review of Systems  Constitutional: Negative for fever and chills.  HENT: Negative for congestion and sore throat.        Objective:   Physical Exam  Constitutional: He appears well-developed and well-nourished. No distress.  Pulmonary/Chest: Breath sounds normal.  Abdominal: Soft. There is no tenderness.       Assessment:    1. Gastroesophageal reflux disease, esophagitis presence not specified     Plan:    Try HOB elevation. Consider adding H2 blocker.

## 2015-05-20 NOTE — Patient Instructions (Signed)
Discussed elevating the head of your bed 6 inches on blocks to decrease reflux symptoms. Avoid extra pillows. Continue omeprazole.

## 2015-07-04 ENCOUNTER — Ambulatory Visit: Payer: Medicare Other | Admitting: Family Medicine

## 2015-07-08 ENCOUNTER — Ambulatory Visit (INDEPENDENT_AMBULATORY_CARE_PROVIDER_SITE_OTHER): Payer: Medicare Other | Admitting: Family Medicine

## 2015-07-08 ENCOUNTER — Encounter: Payer: Self-pay | Admitting: Family Medicine

## 2015-07-08 VITALS — BP 142/70 | HR 77 | Temp 97.8°F | Resp 16 | Wt 227.8 lb

## 2015-07-08 DIAGNOSIS — N529 Male erectile dysfunction, unspecified: Secondary | ICD-10-CM | POA: Diagnosis not present

## 2015-07-08 DIAGNOSIS — M25562 Pain in left knee: Secondary | ICD-10-CM

## 2015-07-08 MED ORDER — MELOXICAM 15 MG PO TABS: 15.0000 mg | ORAL_TABLET | Freq: Every day | ORAL | Status: DC

## 2015-07-08 MED ORDER — TADALAFIL 20 MG PO TABS: 20.0000 mg | ORAL_TABLET | Freq: Every day | ORAL | Status: DC | PRN

## 2015-07-08 NOTE — Patient Instructions (Signed)
Let me know how your knee is doing on meloxicam.

## 2015-07-08 NOTE — Progress Notes (Signed)
Subjective:     Patient ID: Tom Moore, male   DOB: 11/18/1945, 69 y.o.   MRN: [id number]  HPI  Chief Complaint  Patient presents with  . Knee Pain    Left  States this has been hurting him for a week with w.b.and when getting up from a sitting position. Denies specific injury but walks daily for exercise and glucose control. States he will be going to [place] November 8 and is planning to seek out electrical stimulation treatments which helped his right knee in the past.He wishes pain medication in the interim but not further workup. Also wishes Cialis 20 mg. E.D.   Review of Systems  Musculoskeletal:       Right knee x-ray 2014 with mild patellar arthritic changes.       Objective:   Physical Exam  Constitutional: He appears well-developed and well-nourished. No distress.  Musculoskeletal:  Right knee without erythema or effusion. Increased pain with flexion > 90 degrees. Mild patellar crepitus with ranging appreciated. KF/KE 5/5. Ligaments stable.       Assessment:    1. Knee pain, acute, left - meloxicam (MOBIC) 15 MG tablet; Take 1 tablet (15 mg total) by mouth daily.  Dispense: 30 tablet; Refill: 0  2. Erectile dysfunction, unspecified erectile dysfunction type - tadalafil (CIALIS) 20 MG tablet; Take 1 tablet (20 mg total) by mouth daily as needed for erectile dysfunction.  Dispense: 5 tablet; Refill: 0    Plan:    He will call if nsaid not relieving pain prior to his trip.

## 2015-07-30 ENCOUNTER — Other Ambulatory Visit: Payer: Self-pay | Admitting: Family Medicine

## 2015-07-30 NOTE — Telephone Encounter (Signed)
Patient came into office today because pharmacy would not let him fill his two scripts for Pioglitazone and Lisinopril. He stated that pharmacy said it was soon to fill and that he can get Rx filled on 08/17/15. Patient stated that he is leaving out of country on 08/05/15 for 4 months and will not be able to get his scripts, patient is afraid that he will be without medication, he request that we contact pharmacy and authorize to have medications filled. Please advise. KW

## 2015-07-31 ENCOUNTER — Telehealth: Payer: Self-pay | Admitting: Family Medicine

## 2015-07-31 ENCOUNTER — Other Ambulatory Visit: Payer: Self-pay | Admitting: Family Medicine

## 2015-07-31 MED ORDER — LISINOPRIL 20 MG PO TABS: 20.0000 mg | ORAL_TABLET | Freq: Every day | ORAL | Status: DC

## 2015-07-31 MED ORDER — PIOGLITAZONE HCL 30 MG PO TABS: 30.0000 mg | ORAL_TABLET | Freq: Every day | ORAL | Status: DC

## 2015-07-31 NOTE — Telephone Encounter (Signed)
Pt is returning call.  WU#[phone]

## 2015-07-31 NOTE — Telephone Encounter (Signed)
Pt is returning call.  HN#[phone]

## 2015-07-31 NOTE — Telephone Encounter (Signed)
Notified patient that scripts have been sent in. Tom Moore

## 2015-07-31 NOTE — Telephone Encounter (Signed)
Patient has been advised of scripts available for pick up. KW

## 2015-08-01 NOTE — Telephone Encounter (Signed)
Spoke with patient and advised prescriptions sent to pharmacy. KW

## 2015-11-27 ENCOUNTER — Other Ambulatory Visit: Payer: Self-pay | Admitting: Family Medicine

## 2015-12-01 ENCOUNTER — Ambulatory Visit (INDEPENDENT_AMBULATORY_CARE_PROVIDER_SITE_OTHER): Payer: Medicare Other | Admitting: Family Medicine

## 2015-12-01 ENCOUNTER — Encounter: Payer: Self-pay | Admitting: Family Medicine

## 2015-12-01 VITALS — BP 128/80 | HR 78 | Temp 98.4°F | Resp 16 | Wt 241.6 lb

## 2015-12-01 DIAGNOSIS — R0600 Dyspnea, unspecified: Secondary | ICD-10-CM | POA: Diagnosis not present

## 2015-12-01 DIAGNOSIS — R002 Palpitations: Secondary | ICD-10-CM | POA: Diagnosis not present

## 2015-12-01 LAB — EKG 12-LEAD

## 2015-12-01 NOTE — Progress Notes (Signed)
Subjective:     Patient ID: Tom Moore, male   DOB: Aug 24, 1946, 70 y.o.   MRN: [id number]  HPI  Chief Complaint  Patient presents with  . Follow-up    Patient comes into office today for hospital follow up. Patient reports that he has been in [place] for the past 4 months and on 10/22/15 he was seen in ER with complaints of shortness of breath. Patient reports that his labs and EKG were normal, chest x-ray was done and he states that doctor informed he he had fluid in his lungs. Patient was prescribed Furosemida 40mg  1/2 po qd, patient reports that he was taking a whole tablet daily and symptoms seem to improve but now shortness of breath has returned.   States he has been sitting up at night to sleep but still feels short of breath in the office today. He is to schedule f/u with endocrinology now that he has returned from [place]. He is not aware that is heart is racing on exam.   Review of Systems  Cardiovascular: Negative for chest pain.       Objective:   Physical Exam  Constitutional: He appears well-developed and well-nourished. No distress.  Cardiovascular:  Irregular rhythm; tachycardia.  Pulmonary/Chest: Breath sounds normal. No respiratory distress.  Musculoskeletal: He exhibits no edema (no edema of lower extremities).       Assessment:    1. Dyspnea - CBC with Differential/Platelet - B Nat Peptide  2. Palpitations - EKG 12-Lead - Comprehensive metabolic panel - TSH - T4, free - Magnesium    Plan:    Stop pioglitzone (Actos). Consider cardiology referral.

## 2015-12-01 NOTE — Patient Instructions (Signed)
Stop Actos (pioglitazone). We will call you with the lab results tomorrow.

## 2015-12-02 ENCOUNTER — Telehealth: Payer: Self-pay

## 2015-12-02 ENCOUNTER — Other Ambulatory Visit: Payer: Self-pay | Admitting: Family Medicine

## 2015-12-02 DIAGNOSIS — R0600 Dyspnea, unspecified: Secondary | ICD-10-CM

## 2015-12-02 LAB — COMPREHENSIVE METABOLIC PANEL
ALBUMIN: 4.4 g/dL (ref 3.6–4.8)
ALK PHOS: 85 IU/L (ref 39–117)
ALT: 29 IU/L (ref 0–44)
AST: 21 IU/L (ref 0–40)
Albumin/Globulin Ratio: 1.8 (ref 1.1–2.5)
BUN / CREAT RATIO: 21 (ref 10–22)
BUN: 23 mg/dL (ref 8–27)
Bilirubin Total: 0.5 mg/dL (ref 0.0–1.2)
CO2: 23 mmol/L (ref 18–29)
CREATININE: 1.1 mg/dL (ref 0.76–1.27)
Calcium: 9.4 mg/dL (ref 8.6–10.2)
Chloride: 100 mmol/L (ref 96–106)
GFR, EST AFRICAN AMERICAN: 79 mL/min/{1.73_m2} (ref >59)
GFR, EST NON AFRICAN AMERICAN: 69 mL/min/{1.73_m2} (ref >59)
GLOBULIN, TOTAL: 2.4 g/dL (ref 1.5–4.5)
Glucose: 275 mg/dL — ABNORMAL HIGH (ref 65–99)
Potassium: 4.7 mmol/L (ref 3.5–5.2)
SODIUM: 142 mmol/L (ref 134–144)
Total Protein: 6.8 g/dL (ref 6.0–8.5)

## 2015-12-02 LAB — CBC WITH DIFFERENTIAL/PLATELET
Basophils Absolute: 0 10*3/uL (ref 0.0–0.2)
Basos: 0 %
EOS (ABSOLUTE): 0.2 10*3/uL (ref 0.0–0.4)
EOS: 3 %
HEMATOCRIT: 44.3 % (ref 37.5–51.0)
HEMOGLOBIN: 14.6 g/dL (ref 12.6–17.7)
Immature Grans (Abs): 0 10*3/uL (ref 0.0–0.1)
Immature Granulocytes: 0 %
LYMPHS ABS: 1.8 10*3/uL (ref 0.7–3.1)
Lymphs: 25 %
MCH: 30.7 pg (ref 26.6–33.0)
MCHC: 33 g/dL (ref 31.5–35.7)
MCV: 93 fL (ref 79–97)
MONOCYTES: 5 %
Monocytes Absolute: 0.4 10*3/uL (ref 0.1–0.9)
NEUTROS ABS: 4.7 10*3/uL (ref 1.4–7.0)
Neutrophils: 67 %
Platelets: 148 10*3/uL — ABNORMAL LOW (ref 150–379)
RBC: 4.75 x10E6/uL (ref 4.14–5.80)
RDW: 14.4 % (ref 12.3–15.4)
WBC: 7.1 10*3/uL (ref 3.4–10.8)

## 2015-12-02 LAB — MAGNESIUM: Magnesium: 1.8 mg/dL (ref 1.6–2.3)

## 2015-12-02 LAB — TSH: TSH: 0.204 u[IU]/mL — AB (ref 0.450–4.500)

## 2015-12-02 LAB — T4, FREE: Free T4: 1.34 ng/dL (ref 0.82–1.77)

## 2015-12-02 LAB — BRAIN NATRIURETIC PEPTIDE: BNP: 463.8 pg/mL — ABNORMAL HIGH (ref 0.0–100.0)

## 2015-12-02 NOTE — Telephone Encounter (Signed)
-----   Message from Carmon Ginsberg, [place] sent at 12/02/2015  7:58 AM EST ----- Labs look ok so far-I am waiting for one more to come in. Would like to know how you feel today off pioglitazone.( Note to Kat: his first phone number did not accept incoming calls and the second was no longer his number)

## 2015-12-02 NOTE — Telephone Encounter (Signed)
Patient has been notified. KW

## 2015-12-02 NOTE — Telephone Encounter (Signed)
Spoke with patient on the phone ( I have updated number in chart) he states that he hasn't noticed any difference this morning since stopping Pioglitazone, I advised patient of lab report he wanted me to inform you that he still did not sleep well last night.

## 2015-12-02 NOTE — Telephone Encounter (Signed)
-----   Message from Carmon Ginsberg, [place] sent at 12/02/2015 12:00 PM EST ----- We have received the last lab and forwarded it to Dr. Clayborn Bigness. It suggests that you may have a component of heart failure which he will evaluate. Once again pioglitazone may have been playing a part in excess fluid.

## 2015-12-02 NOTE — Telephone Encounter (Signed)
I will start a referral to cardiology. It will take at least one day for the pioglitazone to wear off.

## 2015-12-12 ENCOUNTER — Other Ambulatory Visit: Payer: Self-pay | Admitting: Family Medicine

## 2015-12-17 ENCOUNTER — Encounter: Payer: Self-pay | Admitting: Family Medicine

## 2015-12-18 ENCOUNTER — Emergency Department: Payer: Medicare Other

## 2015-12-18 ENCOUNTER — Encounter: Payer: Self-pay | Admitting: Emergency Medicine

## 2015-12-18 ENCOUNTER — Ambulatory Visit (INDEPENDENT_AMBULATORY_CARE_PROVIDER_SITE_OTHER): Payer: Medicare Other | Admitting: Family Medicine

## 2015-12-18 ENCOUNTER — Encounter: Payer: Self-pay | Admitting: Family Medicine

## 2015-12-18 ENCOUNTER — Emergency Department
Admission: EM | Admit: 2015-12-18 | Discharge: 2015-12-18 | Disposition: A | Discharge status: Home / Self Care | Payer: Medicare Other | Attending: Emergency Medicine | Admitting: Emergency Medicine

## 2015-12-18 VITALS — BP 118/84 | HR 121 | Temp 98.3°F | Resp 20 | Wt 239.8 lb

## 2015-12-18 DIAGNOSIS — E119 Type 2 diabetes mellitus without complications: Secondary | ICD-10-CM | POA: Diagnosis not present

## 2015-12-18 DIAGNOSIS — I10 Essential (primary) hypertension: Secondary | ICD-10-CM | POA: Insufficient documentation

## 2015-12-18 DIAGNOSIS — R0600 Dyspnea, unspecified: Secondary | ICD-10-CM

## 2015-12-18 DIAGNOSIS — Z7984 Long term (current) use of oral hypoglycemic drugs: Secondary | ICD-10-CM | POA: Insufficient documentation

## 2015-12-18 DIAGNOSIS — I509 Heart failure, unspecified: Secondary | ICD-10-CM | POA: Insufficient documentation

## 2015-12-18 DIAGNOSIS — Z87891 Personal history of nicotine dependence: Secondary | ICD-10-CM | POA: Diagnosis not present

## 2015-12-18 DIAGNOSIS — Z79899 Other long term (current) drug therapy: Secondary | ICD-10-CM | POA: Insufficient documentation

## 2015-12-18 DIAGNOSIS — Z791 Long term (current) use of non-steroidal anti-inflammatories (NSAID): Secondary | ICD-10-CM | POA: Diagnosis not present

## 2015-12-18 DIAGNOSIS — R0789 Other chest pain: Secondary | ICD-10-CM

## 2015-12-18 DIAGNOSIS — R0602 Shortness of breath: Secondary | ICD-10-CM | POA: Diagnosis present

## 2015-12-18 LAB — CBC
HEMATOCRIT: 43.7 % (ref 40.0–52.0)
HEMOGLOBIN: 14.6 g/dL (ref 13.0–18.0)
MCH: 29.9 pg (ref 26.0–34.0)
MCHC: 33.3 g/dL (ref 32.0–36.0)
MCV: 89.8 fL (ref 80.0–100.0)
Platelets: 149 10*3/uL — ABNORMAL LOW (ref 150–440)
RBC: 4.87 MIL/uL (ref 4.40–5.90)
RDW: 14.1 % (ref 11.5–14.5)
WBC: 6.8 10*3/uL (ref 3.8–10.6)

## 2015-12-18 LAB — BASIC METABOLIC PANEL
ANION GAP: 8 (ref 5–15)
BUN: 23 mg/dL — ABNORMAL HIGH (ref 6–20)
CO2: 25 mmol/L (ref 22–32)
Calcium: 9.4 mg/dL (ref 8.9–10.3)
Chloride: 104 mmol/L (ref 101–111)
Creatinine, Ser: 1 mg/dL (ref 0.61–1.24)
GFR calc Af Amer: >60 mL/min (ref >60)
GLUCOSE: 127 mg/dL — AB (ref 65–99)
POTASSIUM: 3.6 mmol/L (ref 3.5–5.1)
Sodium: 137 mmol/L (ref 135–145)

## 2015-12-18 LAB — BRAIN NATRIURETIC PEPTIDE: B NATRIURETIC PEPTIDE 5: 462 pg/mL — AB (ref 0.0–100.0)

## 2015-12-18 LAB — TROPONIN I: Troponin I: <0.03 ng/mL (ref <0.031)

## 2015-12-18 IMAGING — CR DG CHEST 2V
2 series · 2 of 2 positions shown · non-contrast
Comparison: [DATE]

CLINICAL DATA: Short of breath

EXAM:
CHEST  2 VIEW

[chest pa]
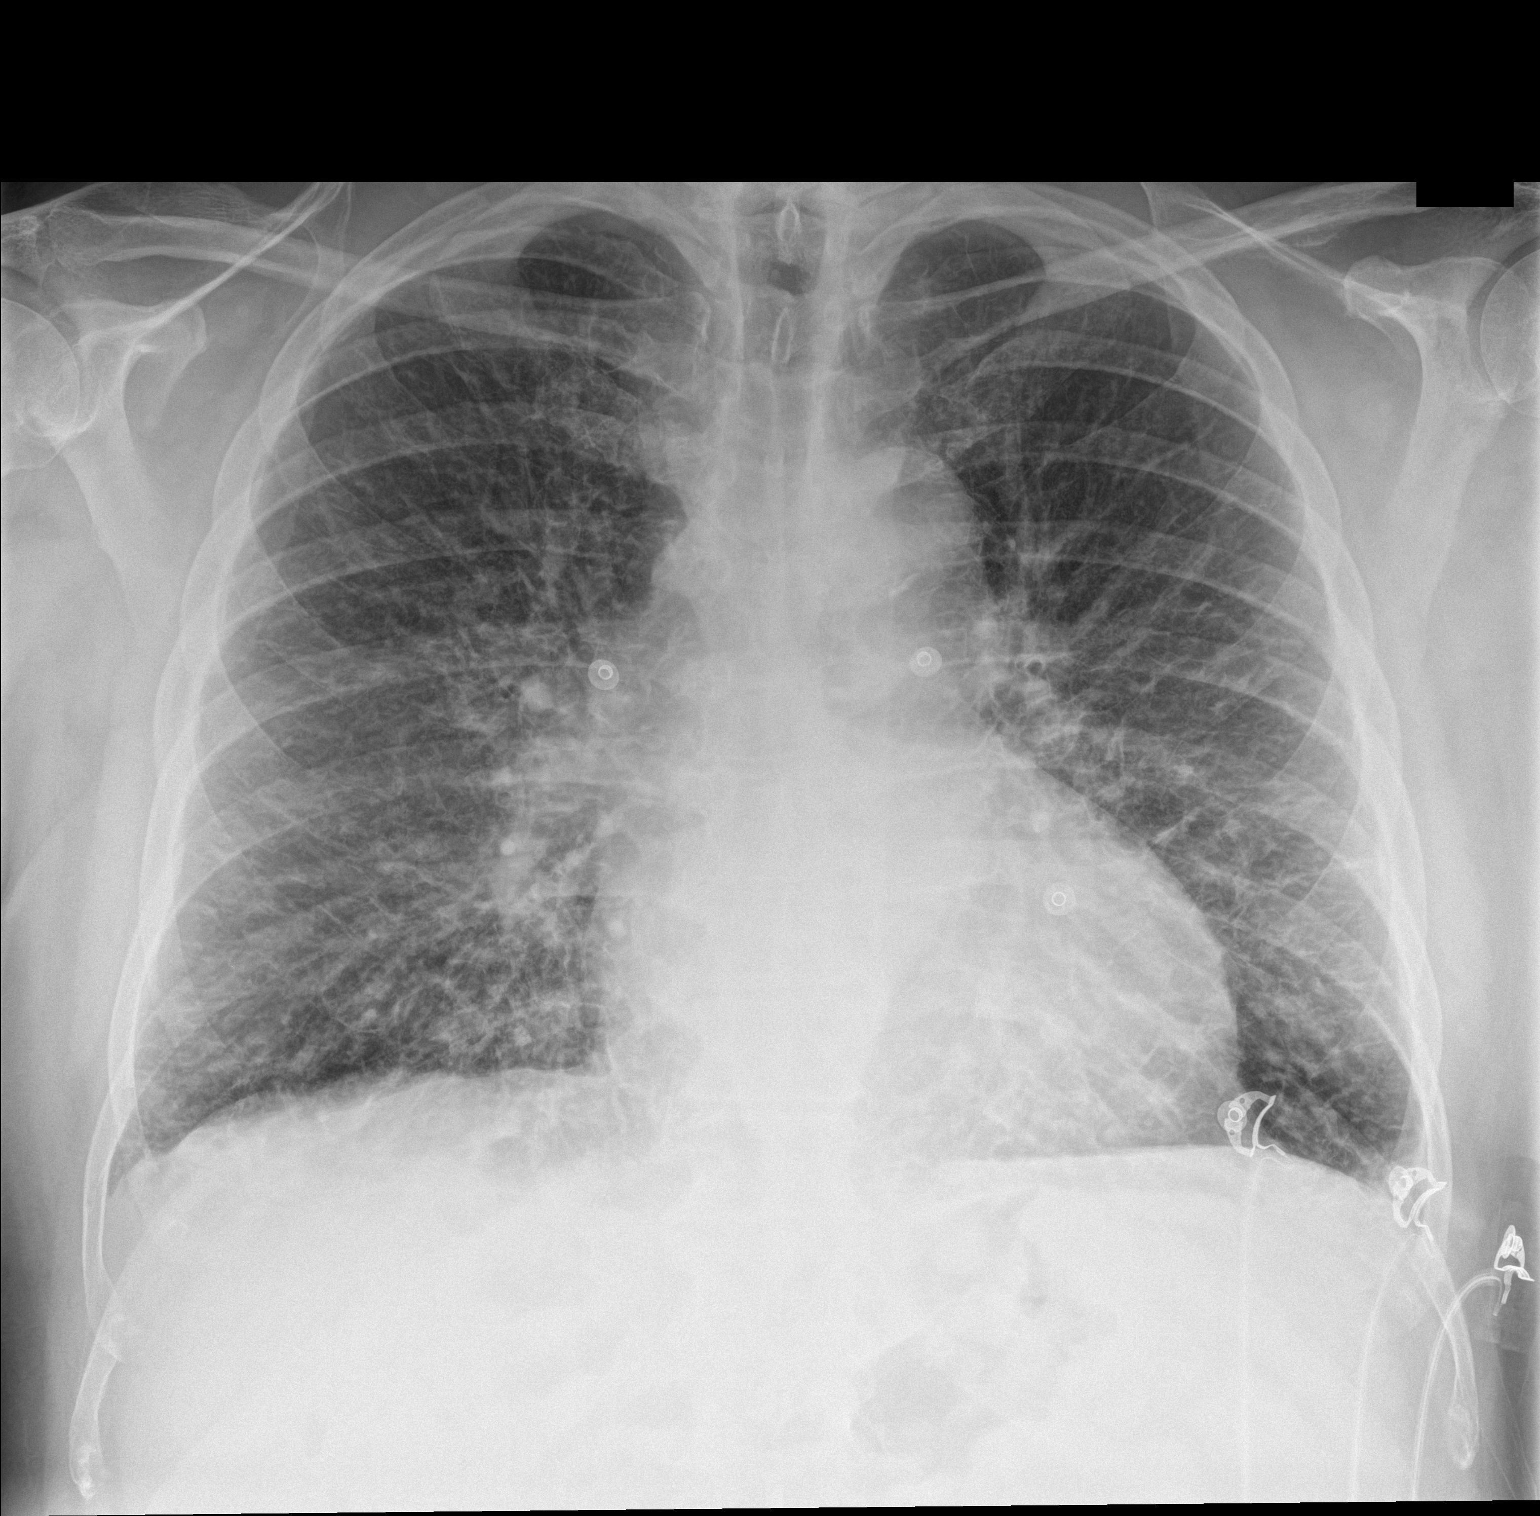

[chest lat]
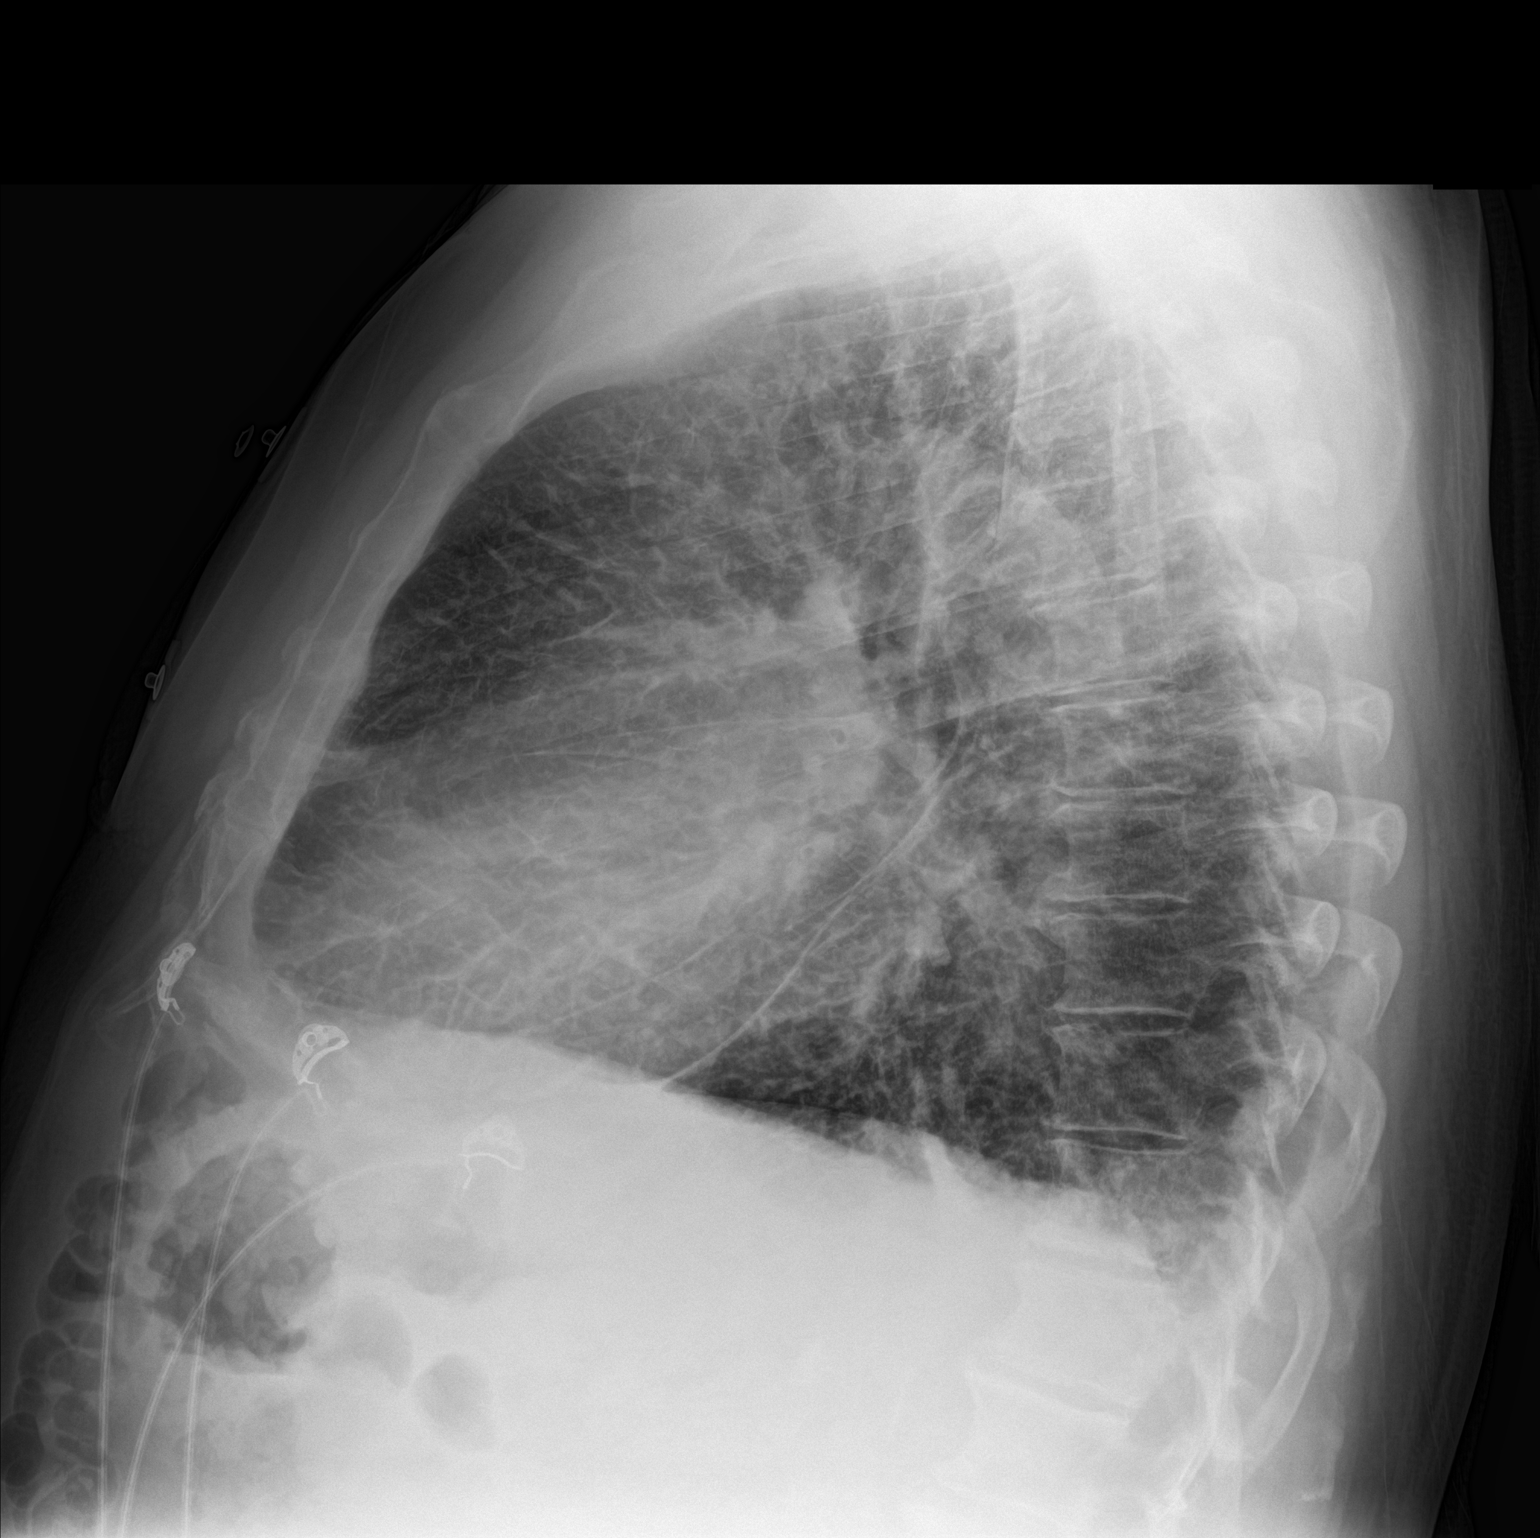

[2 of 2 positions shown; findings below may reference images not displayed]

FINDINGS: Normal heart size. Diffuse interstitial edema. No pneumothorax. No
pleural effusion.
IMPRESSION: Diffuse interstitial edema.  Normal heart size.

## 2015-12-18 MED ORDER — FUROSEMIDE 40 MG PO TABS: 40.0000 mg | ORAL_TABLET | Freq: Two times a day (BID) | ORAL | Status: DC

## 2015-12-18 MED ORDER — POTASSIUM CHLORIDE ER 10 MEQ PO TBCR: 10.0000 meq | EXTENDED_RELEASE_TABLET | Freq: Every day | ORAL | Status: DC

## 2015-12-18 NOTE — ED Notes (Signed)
Patient transported to X-ray 

## 2015-12-18 NOTE — ED Provider Notes (Signed)
[facility] Emergency Department Provider Note   ____________________________________________  Time seen: I have reviewed the triage vital signs and the triage nursing note.  HISTORY  Chief Complaint Shortness of Breath   Historian Patient  HPI Tom Moore is a 70 y.o. male who is recently been diagnosed with pulmonary edema, several months ago when he was in [place] visiting he visited in emergency Department due to shortness of breath and was placed on furosemide. After short course he had return of symptoms and when he got back to the [place] he was laced back on Lasix. Per his medication list he is on Lasix 40 mg once daily.  Patient had an appointment with Dr. Clayborn Bigness, and was evaluated for arrhythmia which it sounds like came out negative. He was scheduled for a stress test on the treadmill tomorrow.  Over the past 3-4 days he has had increasing shortness of breath with any exertion, without chest pain. No palpitations. He has shortness of breath of the past 2 nights when he is lying flat. He's had to sit upright in a chair overnight. No fever or coughing or sputum production. No pleuritic chest pain.       Past Medical History  Diagnosis Date  . Insomnia   . Depression   . Hyperlipidemia   . Anxiety   . Diabetes mellitus without complication ([place])   . Hypertension   . GERD (gastroesophageal reflux disease)     Patient Active Problem List   Diagnosis Date Noted  . Insomnia 05/20/2015  . Depression 05/20/2015  . Hyperlipidemia 05/20/2015  . Anxiety 05/20/2015  . Erectile dysfunction 05/20/2015  . Low TSH level 05/20/2015  . Hypertension 05/20/2015  . Diabetes mellitus ([place]) 05/20/2015  . GERD (gastroesophageal reflux disease) 05/20/2015  . Osteoarthritis, knee 05/20/2015    History reviewed. No pertinent past surgical history.  Current Outpatient Rx  Name  Route  Sig  Dispense  Refill  . ALPRAZolam (XANAX) 0.5 MG tablet    Oral   Take 1 tablet (0.5 mg total) by mouth 3 (three) times daily as needed for anxiety.   12 tablet   0   . furosemide (LASIX) 40 MG tablet   Oral   Take 1 tablet (40 mg total) by mouth 2 (two) times daily.   6 tablet   0   . glipiZIDE (GLUCOTROL) 10 MG tablet      TAKE ONE TABLET BY MOUTH THREE TIMES DAILY BEFORE MEALS   270 tablet   0   . hydrochlorothiazide (HYDRODIURIL) 25 MG tablet      TAKE ONE TABLET BY MOUTH ONCE DAILY   90 tablet   0   . LANTUS SOLOSTAR 100 UNIT/ML Solostar Pen            0     Dispense as written.   Marland Kitchen lisinopril (PRINIVIL,ZESTRIL) 20 MG tablet   Oral   Take 1 tablet (20 mg total) by mouth daily.   90 tablet   1   . Magnesium Oxide 400 MG CAPS   Oral   Take 1 capsule (400 mg total) by mouth 2 (two) times daily.   180 capsule   3   . meloxicam (MOBIC) 15 MG tablet   Oral   Take 1 tablet (15 mg total) by mouth daily.   30 tablet   0   . metFORMIN (GLUCOPHAGE) 1000 MG tablet   Oral   Take 1 tablet (1,000 mg total) by mouth 2 (two)  times daily with a meal.   180 tablet   3   . niacin (NIASPAN) 500 MG CR tablet      TAKE ONE TABLET BY MOUTH ONCE DAILY   90 tablet   0   . omeprazole (PRILOSEC) 40 MG capsule      TAKE ONE CAPSULE BY MOUTH ONCE DAILY   90 capsule   0   . potassium chloride (K-DUR) 10 MEQ tablet   Oral   Take 1 tablet (10 mEq total) by mouth daily.   3 tablet   0   . pravastatin (PRAVACHOL) 40 MG tablet      TAKE ONE TABLET BY MOUTH AT BEDTIME   90 tablet   0   . sertraline (ZOLOFT) 50 MG tablet   Oral   Take 1 tablet (50 mg total) by mouth daily.   90 tablet   3   . tadalafil (CIALIS) 20 MG tablet   Oral   Take 1 tablet (20 mg total) by mouth daily as needed for erectile dysfunction.   5 tablet   0     Allergies Zyrtec allergy  Family History  Problem Relation Age of Onset  . Hypertension Mother   . Heart disease Mother     Social History Social History  Substance Use Topics   . Smoking status: Former Research scientist (life sciences)  . Smokeless tobacco: None  . Alcohol Use: Yes     Comment: Ocassional alcohol use, Drinks wine.    Review of Systems  Constitutional: Negative for fever. Eyes: Negative for visual changes. ENT: Negative for sore throat. Cardiovascular: Negative for chest pain. Respiratory: Positive for shortness of breath. Gastrointestinal: Negative for abdominal pain, vomiting and diarrhea. Genitourinary: Negative for dysuria. Musculoskeletal: Negative for back pain. Skin: Negative for rash. Neurological: Negative for headache. 10 point Review of Systems otherwise negative ____________________________________________   PHYSICAL EXAM:  VITAL SIGNS: ED Triage Vitals  Enc Vitals Group     BP 12/18/15 1322 114/103 mmHg     Pulse Rate 12/18/15 1322 75     Resp 12/18/15 1322 18     Temp 12/18/15 1322 98.3 F (36.8 C)     Temp Source 12/18/15 1322 Oral     SpO2 12/18/15 1322 96 %     Weight 12/18/15 1322 239 lb (108.41 kg)     Height --      Head Cir --      Peak Flow --      Pain Score 12/18/15 1344 0     Pain Loc --      Pain Edu? --      Excl. in [place]? --      Constitutional: Alert and oriented. Well appearing and in no distress. HEENT   Head: Normocephalic and atraumatic.      Eyes: Conjunctivae are normal. PERRL. Normal extraocular movements.      Ears:         Nose: No congestion/rhinnorhea.   Mouth/Throat: Mucous membranes are moist.   Neck: No stridor. Cardiovascular/Chest: Normal rate, regular rhythm.  No murmurs, rubs, or gallops. Respiratory: Normal respiratory effort without tachypnea nor retractions. Breath sounds are clear and equal bilaterally. Mild decreased breath sounds at bases bilaterally. No wheezing.  Gastrointestinal: Soft. No distention, no guarding, no rebound. Nontender.    Genitourinary/rectal:Deferred Musculoskeletal: Nontender with normal range of motion in all extremities. No joint effusions.  No lower extremity  tenderness.  Mild/trace lower extremity edema bilaterally. Neurologic:  Normal speech and language. No gross or  focal neurologic deficits are appreciated. Skin:  Skin is warm, dry and intact. No rash noted. Psychiatric: Mood and affect are normal. Speech and behavior are normal. Patient exhibits appropriate insight and judgment.  ____________________________________________   EKG I, Lisa Roca, MD, the attending physician have personally viewed and interpreted all ECGs.  100 bpm. Sinus tachycardia with occasional PVCs. Nonspecific intraventricular conduction delay. Normal axis. Nonspecific T-wave with T-wave inversion laterally. ____________________________________________  LABS (pertinent positives/negatives)  Basic metabolic panel without significant abnormalities BNP 462 Troponin less than 0.03 White blood count 6.8, hemoglobin 14.6, platelet 149  ____________________________________________  RADIOLOGY All Xrays were viewed by me. Imaging interpreted by Radiologist.  Chest two-view: Diffuse interstitial edema. Normal heart size __________________________________________  PROCEDURES  Procedure(s) performed: None  Critical Care performed: None  ____________________________________________   ED COURSE / ASSESSMENT AND PLAN  Pertinent labs & imaging results that were available during my care of the patient were reviewed by me and considered in my medical decision making (see chart for details).   This patient sounds like he's been recently diagnosed with CHF, and has been on Lasix 40 mg daily, but has not completed his workup with the cardiologist Dr. Clayborn Bigness.  He is experiencing symptoms of a CHF exacerbation with pulmonary edema.  O2 sat is 98% on room air.  I will increase his lasix to 40mg  bid x 3 days with kcl. I was able to speak with Dr. Clayborn Bigness, but did speak with his office and canceled his stress test for tomorrow. Patient will follow next week with Dr.  Clayborn Bigness, and to reschedule his stress test.  CONSULTATIONS:   none   Patient / Family / Caregiver informed of clinical course, medical decision-making process, and agree with plan.   I discussed return precautions, follow-up instructions, and discharged instructions with patient and/or family.   ___________________________________________   FINAL CLINICAL IMPRESSION(S) / ED DIAGNOSES   Final diagnoses:  Acute on chronic congestive heart failure, unspecified congestive heart failure type [facility])              Note: This dictation was prepared with Dragon dictation. Any transcriptional errors that result from this process are unintentional   Lisa Roca, MD 12/18/15 1645

## 2015-12-18 NOTE — ED Notes (Signed)
Vital signs stable. 

## 2015-12-18 NOTE — Progress Notes (Signed)
Subjective:     Patient ID: Tom Moore, male   DOB: [birth year], 70 y.o.   MRN: [id number]  HPI  Chief Complaint  Patient presents with  . Shortness of Breath    Patient returns back to office today to address shortness of breath, patient was last seen in office 3/6. Physical exam at last visiyt showed irregular heart rhythm and tachycardia, labs were ordered and referral for cardiology was made. Patient reports that he is scheduled for stress test tomorrow but is unsure if he can do it due to the dyspnea, he states shortness of breath is making it difficult to sleep.   Recent Holter monitor c/w paroxysmal atrial fibrillation. Felt to also have component of CHF pending further evaluation: "I can't sleep lying or sitting up." Reports chest pressure over the last two days in association with his other symptoms.Review of Systems     Objective:   Physical Exam  Constitutional: He appears well-developed and well-nourished. He appears distressed (anxious).  Cardiovascular:  Tachycardic with irregular rhythm.  Pulmonary/Chest: Breath sounds normal.  Musculoskeletal: He exhibits no edema (of lower extremties).       Assessment:    1. Dyspnea: ? Ischemic EKG changes - EKG 12-Lead  2. Chest pressure    Plan:    Transport to ER via EMS.

## 2015-12-18 NOTE — Patient Instructions (Signed)
F/u with cardiology as directed.

## 2015-12-18 NOTE — Discharge Instructions (Signed)
You were evaluated for shortness of breath and found to have fluid on the lungs from presumed congestive heart failure.  You are going to increase your furosemide to 40mg  twice per day for the next 3 days and take a potassium supplement.  Return to the emergency room for any chest pain, trouble breathing, fever, passing out, or any other symptoms concerning to you. Follow-up with Dr. Clayborn Bigness.   Heart Failure Heart failure is a condition in which the heart has trouble pumping blood. This means your heart does not pump blood efficiently for your body to work well. In some cases of heart failure, fluid may back up into your lungs or you may have swelling (edema) in your lower legs. Heart failure is usually a long-term (chronic) condition. It is important for you to take good care of yourself and follow your health care provider's treatment plan. CAUSES  Some health conditions can cause heart failure. Those health conditions include:  High blood pressure (hypertension). Hypertension causes the heart muscle to work harder than normal. When pressure in the blood vessels is high, the heart needs to pump (contract) with more force in order to circulate blood throughout the body. High blood pressure eventually causes the heart to become stiff and weak.  Coronary artery disease (CAD). CAD is the buildup of cholesterol and fat (plaque) in the arteries of the heart. The blockage in the arteries deprives the heart muscle of oxygen and blood. This can cause chest pain and may lead to a heart attack. High blood pressure can also contribute to CAD.  Heart attack (myocardial infarction). A heart attack occurs when one or more arteries in the heart become blocked. The loss of oxygen damages the muscle tissue of the heart. When this happens, part of the heart muscle dies. The injured tissue does not contract as well and weakens the heart's ability to pump blood.  Abnormal heart valves. When the heart valves do not  open and close properly, it can cause heart failure. This makes the heart muscle pump harder to keep the blood flowing.  Heart muscle disease (cardiomyopathy or myocarditis). Heart muscle disease is damage to the heart muscle from a variety of causes. These can include drug or alcohol abuse, infections, or unknown reasons. These can increase the risk of heart failure.  Lung disease. Lung disease makes the heart work harder because the lungs do not work properly. This can cause a strain on the heart, leading it to fail.  Diabetes. Diabetes increases the risk of heart failure. High blood sugar contributes to high fat (lipid) levels in the blood. Diabetes can also cause slow damage to tiny blood vessels that carry important nutrients to the heart muscle. When the heart does not get enough oxygen and food, it can cause the heart to become weak and stiff. This leads to a heart that does not contract efficiently.  Other conditions can contribute to heart failure. These include abnormal heart rhythms, thyroid problems, and low blood counts (anemia). Certain unhealthy behaviors can increase the risk of heart failure, including:  Being overweight.  Smoking or chewing tobacco.  Eating foods high in fat and cholesterol.  Abusing illicit drugs or alcohol.  Lacking physical activity. SYMPTOMS  Heart failure symptoms may vary and can be hard to detect. Symptoms may include:  Shortness of breath with activity, such as climbing stairs.  Persistent cough.  Swelling of the feet, ankles, legs, or abdomen.  Unexplained weight gain.  Difficulty breathing when lying flat (orthopnea).  Waking from sleep because of the need to sit up and get more air.  Rapid heartbeat.  Fatigue and loss of energy.  Feeling light-headed, dizzy, or close to fainting.  Loss of appetite.  Nausea.  Increased urination during the night (nocturia). DIAGNOSIS  A diagnosis of heart failure is based on your history,  symptoms, physical examination, and diagnostic tests. Diagnostic tests for heart failure may include:  Echocardiography.  Electrocardiography.  Chest X-ray.  Blood tests.  Exercise stress test.  Cardiac angiography.  Radionuclide scans. TREATMENT  Treatment is aimed at managing the symptoms of heart failure. Medicines, behavioral changes, or surgical intervention may be necessary to treat heart failure.  Medicines to help treat heart failure may include:  Angiotensin-converting enzyme (ACE) inhibitors. This type of medicine blocks the effects of a blood protein called angiotensin-converting enzyme. ACE inhibitors relax (dilate) the blood vessels and help lower blood pressure.  Angiotensin receptor blockers (ARBs). This type of medicine blocks the actions of a blood protein called angiotensin. Angiotensin receptor blockers dilate the blood vessels and help lower blood pressure.  Water pills (diuretics). Diuretics cause the kidneys to remove salt and water from the blood. The extra fluid is removed through urination. This loss of extra fluid lowers the volume of blood the heart pumps.  Beta blockers. These prevent the heart from beating too fast and improve heart muscle strength.  Digitalis. This increases the force of the heartbeat.  Healthy behavior changes include:  Obtaining and maintaining a healthy weight.  Stopping smoking or chewing tobacco.  Eating heart-healthy foods.  Limiting or avoiding alcohol.  Stopping illicit drug use.  Physical activity as directed by your health care provider.  Surgical treatment for heart failure may include:  A procedure to open blocked arteries, repair damaged heart valves, or remove damaged heart muscle tissue.  A pacemaker to improve heart muscle function and control certain abnormal heart rhythms.  An internal cardioverter defibrillator to treat certain serious abnormal heart rhythms.  A left ventricular assist device (LVAD)  to assist the pumping ability of the heart. HOME CARE INSTRUCTIONS   Take medicines only as directed by your health care provider. Medicines are important in reducing the workload of your heart, slowing the progression of heart failure, and improving your symptoms.  Do not stop taking your medicine unless directed by your health care provider.  Do not skip any dose of medicine.  Refill your prescriptions before you run out of medicine. Your medicines are needed every day.  Engage in moderate physical activity if directed by your health care provider. Moderate physical activity can benefit some people. The elderly and people with severe heart failure should consult with a health care provider for physical activity recommendations.  Eat heart-healthy foods. Food choices should be free of trans fat and low in saturated fat, cholesterol, and salt (sodium). Healthy choices include fresh or frozen fruits and vegetables, fish, lean meats, legumes, fat-free or low-fat dairy products, and whole grain or high fiber foods. Talk to a dietitian to learn more about heart-healthy foods.  Limit sodium if directed by your health care provider. Sodium restriction may reduce symptoms of heart failure in some people. Talk to a dietitian to learn more about heart-healthy seasonings.  Use healthy cooking methods. Healthy cooking methods include roasting, grilling, broiling, baking, poaching, steaming, or stir-frying. Talk to a dietitian to learn more about healthy cooking methods.  Limit fluids if directed by your health care provider. Fluid restriction may reduce symptoms of heart failure  in some people.  Weigh yourself every day. Daily weights are important in the early recognition of excess fluid. You should weigh yourself every morning after you urinate and before you eat breakfast. Wear the same amount of clothing each time you weigh yourself. Record your daily weight. Provide your health care provider with your  weight record.  Monitor and record your blood pressure if directed by your health care provider.  Check your pulse if directed by your health care provider.  Lose weight if directed by your health care provider. Weight loss may reduce symptoms of heart failure in some people.  Stop smoking or chewing tobacco. Nicotine makes your heart work harder by causing your blood vessels to constrict. Do not use nicotine gum or patches before talking to your health care provider.  Keep all follow-up visits as directed by your health care provider. This is important.  Limit alcohol intake to no more than 1 drink per day for nonpregnant women and 2 drinks per day for men. One drink equals 12 ounces of beer, 5 ounces of wine, or 1 ounces of hard liquor. Drinking more than that is harmful to your heart. Tell your health care provider if you drink alcohol several times a week. Talk with your health care provider about whether alcohol is safe for you. If your heart has already been damaged by alcohol or you have severe heart failure, drinking alcohol should be stopped completely.  Stop illicit drug use.  Stay up-to-date with immunizations. It is especially important to prevent respiratory infections through current pneumococcal and influenza immunizations.  Manage other health conditions such as hypertension, diabetes, thyroid disease, or abnormal heart rhythms as directed by your health care provider.  Learn to manage stress.  Plan rest periods when fatigued.  Learn strategies to manage high temperatures. If the weather is extremely hot:  Avoid vigorous physical activity.  Use air conditioning or fans or seek a cooler location.  Avoid caffeine and alcohol.  Wear loose-fitting, lightweight, and light-colored clothing.  Learn strategies to manage cold temperatures. If the weather is extremely cold:  Avoid vigorous physical activity.  Layer clothes.  Wear mittens or gloves, a hat, and a scarf  when going outside.  Avoid alcohol.  Obtain ongoing education and support as needed.  Participate in or seek rehabilitation as needed to maintain or improve independence and quality of life. SEEK MEDICAL CARE IF:   You have a rapid weight gain.  You have increasing shortness of breath that is unusual for you.  You are unable to participate in your usual physical activities.  You tire easily.  You cough more than normal, especially with physical activity.  You have any or more swelling in areas such as your hands, feet, ankles, or abdomen.  You are unable to sleep because it is hard to breathe.  You feel like your heart is beating fast (palpitations).  You become dizzy or light-headed upon standing up. SEEK IMMEDIATE MEDICAL CARE IF:   You have difficulty breathing.  There is a change in mental status such as decreased alertness or difficulty with concentration.  You have a pain or discomfort in your chest.  You have an episode of fainting (syncope). MAKE SURE YOU:   Understand these instructions.  Will watch your condition.  Will get help right away if you are not doing well or get worse.   This information is not intended to replace advice given to you by your health care provider. Make sure you discuss  any questions you have with your health care provider.   Document Released: 09/13/2005 Document Revised: 01/28/2015 Document Reviewed: 10/13/2012 Elsevier Interactive Patient Education [employer].

## 2015-12-18 NOTE — ED Notes (Signed)
Shortness of breath, orthopnea.  Sent over from physicians office for eval.  Has history of fluid on lungs and having to take lasix.  Has stress test ordered Monday but doesn't think he can wait.  Dr. Etta Quill patient.

## 2016-01-28 ENCOUNTER — Ambulatory Visit: Payer: Medicare Other | Admitting: Family Medicine

## 2016-03-28 ENCOUNTER — Other Ambulatory Visit: Payer: Self-pay | Admitting: Family Medicine

## 2016-04-27 ENCOUNTER — Other Ambulatory Visit: Payer: Self-pay | Admitting: Family Medicine

## 2016-04-27 DIAGNOSIS — E119 Type 2 diabetes mellitus without complications: Secondary | ICD-10-CM

## 2016-11-30 ENCOUNTER — Encounter: Payer: Self-pay | Admitting: Family Medicine

## 2016-11-30 ENCOUNTER — Ambulatory Visit (INDEPENDENT_AMBULATORY_CARE_PROVIDER_SITE_OTHER): Payer: Medicare Other | Admitting: Family Medicine

## 2016-11-30 VITALS — BP 112/64 | HR 76 | Temp 98.1°F | Resp 16 | Wt 230.6 lb

## 2016-11-30 DIAGNOSIS — H60542 Acute eczematoid otitis externa, left ear: Secondary | ICD-10-CM | POA: Diagnosis not present

## 2016-11-30 DIAGNOSIS — I480 Paroxysmal atrial fibrillation: Secondary | ICD-10-CM | POA: Diagnosis not present

## 2016-11-30 DIAGNOSIS — E0590 Thyrotoxicosis, unspecified without thyrotoxic crisis or storm: Secondary | ICD-10-CM

## 2016-11-30 DIAGNOSIS — I5022 Chronic systolic (congestive) heart failure: Secondary | ICD-10-CM | POA: Diagnosis not present

## 2016-11-30 DIAGNOSIS — R143 Flatulence: Secondary | ICD-10-CM | POA: Diagnosis not present

## 2016-11-30 DIAGNOSIS — I5020 Unspecified systolic (congestive) heart failure: Secondary | ICD-10-CM | POA: Insufficient documentation

## 2016-11-30 MED ORDER — TRIAMCINOLONE ACETONIDE 0.1 % EX CREA: TOPICAL_CREAM | CUTANEOUS | 1 refills | Status: DC

## 2016-11-30 NOTE — Progress Notes (Signed)
Subjective:     Patient ID: Tom Moore, male   DOB: 1945-12-21, 71 y.o.   MRN: [id number]  HPI  Chief Complaint  Patient presents with  . GI Problem    Patient comes in office today with complaints of frequent gas for the past two weeks, paitent denies eating certain foods that could cause gas. Patient denies bloating but states tat times he has slight discomfort around his  upper abdominal area. Patient has not tried any otc medication to relieve symptoms.   . Ear Pain    Patient complains of a bump in his inner left ear that has been present for a week patient reports area is tender to the touch.   Hx of ear eczema previously treated with TAC cream. States ear began itching then became tender after scratching frequently. Reports daily formed to occasionally loose bowel movements. Reports increased flatulence. Currently followed by [facility] cardiology, [facility] endocrinology, and [facility] endocrinology.   Review of Systems     Objective:   Physical Exam  Constitutional: He appears well-developed and well-nourished. No distress.  HENT:  Left external ear with two denuded areas c/w itch/scratch cycle. Small amount of scaling noted.  Abdominal: Soft. There is no tenderness. There is no guarding.       Assessment:    1. Eczema of external ear, left: TAC cream refilled  2. Flatulence    Plan:    Provided with FODMAP food choice sheet. Discussed eliminating all foods on the high FODMAP side then if sx abate to slowly reintroduce foods one by one. May try otc products like Beano as well.

## 2016-11-30 NOTE — Patient Instructions (Addendum)
Have provided a list of foods that can promote gas. Give these food choices a try for 2 weeks. May also try Beano or similar.

## 2016-12-03 ENCOUNTER — Encounter: Payer: Self-pay | Admitting: Family Medicine

## 2016-12-03 ENCOUNTER — Ambulatory Visit (INDEPENDENT_AMBULATORY_CARE_PROVIDER_SITE_OTHER): Payer: Medicare Other | Admitting: Family Medicine

## 2016-12-03 VITALS — BP 110/56 | HR 66 | Temp 99.0°F | Resp 16 | Wt 227.6 lb

## 2016-12-03 DIAGNOSIS — K1379 Other lesions of oral mucosa: Secondary | ICD-10-CM | POA: Diagnosis not present

## 2016-12-03 DIAGNOSIS — B9789 Other viral agents as the cause of diseases classified elsewhere: Secondary | ICD-10-CM | POA: Diagnosis not present

## 2016-12-03 DIAGNOSIS — J069 Acute upper respiratory infection, unspecified: Secondary | ICD-10-CM | POA: Diagnosis not present

## 2016-12-03 DIAGNOSIS — F329 Major depressive disorder, single episode, unspecified: Secondary | ICD-10-CM | POA: Diagnosis not present

## 2016-12-03 DIAGNOSIS — F32A Depression, unspecified: Secondary | ICD-10-CM

## 2016-12-03 MED ORDER — VENLAFAXINE HCL ER 75 MG PO CP24: 75.0000 mg | ORAL_CAPSULE | Freq: Every day | ORAL | 0 refills | Status: DC

## 2016-12-03 NOTE — Patient Instructions (Addendum)
Discussed use of saline/hydrogen peroxide mouth wash (1/2 teaspoon of salt in 4 oz of warm water with few drops of hydrogen peroxide) 3-4 x day.Mucinex D or similar for sinus congestion. Delsym for cough.

## 2016-12-03 NOTE — Progress Notes (Signed)
Subjective:     Patient ID: Tom Moore, male   DOB: 1946/02/09, 71 y.o.   MRN: [id number]  HPI  Chief Complaint  Patient presents with  . Oral Pain    Patient comes in office today with complaints of bump under his tongue that he noticed yesterday. Patient states that pain is constant and he has pain radiating to right side of his face and neck. Patient states that he had broke a Aleve Capusle and placed it on area to ease pain.   . Depression    Patient would like to address his recent mood changes over the past several months. Patient states that it is hard for him to get out of bed and that he would stay in bed all day if he could. Patient states that he has very little interest in doing things and going out.   Was treated for depression at [facility] this past summer with Venlafaxine but did not return for followup/dose adjustment. Currently living with his daughter and grandchild who may be planning to move out. States he wears a prosthesis for a lower missing tooth but believes it irritated his gums. Also feels cold symptoms coming in with runny nose and sore throat.   Review of Systems     Objective:   Physical Exam  Constitutional: He appears well-developed and well-nourished.  HENT:  Tonsils absent Missing incisor in lower jaw. There is gum recession around the teeth but not swelling or drainage. He is tender in the socket area.  Lymphadenopathy:    He has no cervical adenopathy.       Assessment:    1. Depression, unspecified depression type: will increase medication - venlafaxine XR (EFFEXOR XR) 75 MG 24 hr capsule; Take 1 capsule (75 mg total) by mouth daily with breakfast.  Dispense: 30 capsule; Refill: 0  2. Oral pain  3. Viral upper respiratory tract infection    Plan:    Discussed sx treatment of cold sx. Oral rinses several x day with saline/H202 for oral irritation.

## 2016-12-17 ENCOUNTER — Ambulatory Visit (INDEPENDENT_AMBULATORY_CARE_PROVIDER_SITE_OTHER): Payer: Medicare Other | Admitting: Family Medicine

## 2016-12-17 ENCOUNTER — Encounter: Payer: Self-pay | Admitting: Family Medicine

## 2016-12-17 VITALS — BP 108/66 | HR 80 | Temp 97.8°F | Resp 16 | Wt 229.4 lb

## 2016-12-17 DIAGNOSIS — F32A Depression, unspecified: Secondary | ICD-10-CM

## 2016-12-17 DIAGNOSIS — F329 Major depressive disorder, single episode, unspecified: Secondary | ICD-10-CM | POA: Diagnosis not present

## 2016-12-17 MED ORDER — VENLAFAXINE HCL ER 150 MG PO CP24: 150.0000 mg | ORAL_CAPSULE | Freq: Every day | ORAL | 1 refills | Status: DC

## 2016-12-17 NOTE — Patient Instructions (Signed)
Let me know if you can't tolerate the medication. You can take two of the left over 75 mg. At the same time to use them up if you desire.

## 2016-12-17 NOTE — Progress Notes (Signed)
Subjective:     Patient ID: Tom Moore, male   DOB: December 17, 1945, 71 y.o.   MRN: [id number]  HPI  Chief Complaint  Patient presents with  . Depression    Patient returns to office today for two week follow up, last office visit was 12/03/16. At last visit Venlafaxine was increased to 75mg  qd. Patient reports that symptoms are still unchanged.  . Fatigue    Patient reports that he will be returning back to work on 12/27/16 and would like to discuss medication options that will help him have more energy.   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   Review of Systems     Objective:   Physical Exam  Constitutional: He appears well-developed and well-nourished. No distress.  Psychiatric: He has a normal mood and affect. His behavior is normal.       Assessment:    1. Depression, unspecified depression type: increase dose of medication - venlafaxine XR (EFFEXOR-XR) 150 MG 24 hr capsule; Take 1 capsule (150 mg total) by mouth daily with breakfast.  Dispense: 30 capsule; Refill: 1    Plan:    Call if not tolerating medication pending f/u visit. Consider psych.referral if not improving.

## 2016-12-20 LAB — HEMOGLOBIN A1C: HEMOGLOBIN A1C: 7.3

## 2016-12-22 ENCOUNTER — Other Ambulatory Visit: Payer: Self-pay | Admitting: Family Medicine

## 2016-12-22 DIAGNOSIS — E119 Type 2 diabetes mellitus without complications: Secondary | ICD-10-CM

## 2016-12-24 ENCOUNTER — Other Ambulatory Visit: Payer: Self-pay | Admitting: Family Medicine

## 2016-12-31 ENCOUNTER — Ambulatory Visit (INDEPENDENT_AMBULATORY_CARE_PROVIDER_SITE_OTHER): Payer: Medicare Other | Admitting: Family Medicine

## 2016-12-31 ENCOUNTER — Encounter: Payer: Self-pay | Admitting: Family Medicine

## 2016-12-31 VITALS — BP 120/76 | HR 68 | Temp 98.0°F | Resp 16 | Ht 69.0 in | Wt 231.0 lb

## 2016-12-31 DIAGNOSIS — R39198 Other difficulties with micturition: Secondary | ICD-10-CM

## 2016-12-31 DIAGNOSIS — F32A Depression, unspecified: Secondary | ICD-10-CM

## 2016-12-31 DIAGNOSIS — F329 Major depressive disorder, single episode, unspecified: Secondary | ICD-10-CM

## 2016-12-31 LAB — POCT URINALYSIS DIPSTICK
BILIRUBIN UA: NEGATIVE
Blood, UA: NEGATIVE
GLUCOSE UA: NEGATIVE
KETONES UA: NEGATIVE
LEUKOCYTES UA: NEGATIVE
NITRITE UA: NEGATIVE
Protein, UA: NEGATIVE
Spec Grav, UA: 1.03 (ref 1.030–1.035)
Urobilinogen, UA: 0.2 (ref ?–2.0)
pH, UA: 6.5 (ref 5.0–8.0)

## 2016-12-31 NOTE — Patient Instructions (Signed)
We will call you with the results of the prostate test.

## 2016-12-31 NOTE — Progress Notes (Signed)
Subjective:     Patient ID: NORTH ESTERLINE, male   DOB: [birth year], 71 y.o.   MRN: [id number]  HPI  Chief Complaint  Patient presents with  . Depression    Patient comes in today for a follow up. He was last seen in the office 2 weeks ago. Since then Effexor was increased to 150mg  daily. Patient reports that he is tolerating med changes well.   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 States he is no longer depressed. Reports for the last month he has had a decreased urinary stream and at times incomplete emptying. Reports nocturia x 2. Last PSA 2.0 in 2016.   Review of Systems  Endocrine:       Diabetes being followed by Dr. Nilda Simmer at [facility]. A1C last month 7.3.       Objective:   Physical Exam  Constitutional: He appears well-developed and well-nourished. No distress.  Genitourinary:  Genitourinary Comments: Rectal exam: external skin tags; prostate not well felt.       Assessment:    1. Depression, unspecified depression type: stable on current dose of venlafaxine  2. Urinary stream slowing - POCT urinalysis dipstick - PSA    Plan:    further f/u pending PSA result. Consider Flomax and/or urology referral if PSA elevated.

## 2017-01-01 LAB — PSA: Prostate Specific Ag, Serum: 0.7 ng/mL (ref 0.0–4.0)

## 2017-01-03 ENCOUNTER — Telehealth: Payer: Self-pay

## 2017-01-03 NOTE — Telephone Encounter (Signed)
[id number] 

## 2017-01-03 NOTE — Telephone Encounter (Signed)
-----   Message from Carmon Ginsberg, [place] sent at 01/03/2017  7:31 AM EDT ----- Prostate blood test ok. Do you wish to first try a medication to help you urinate better or see the urologist now? (patient gets home from work after 3 PM)

## 2017-01-04 ENCOUNTER — Other Ambulatory Visit: Payer: Self-pay | Admitting: Family Medicine

## 2017-01-04 MED ORDER — TAMSULOSIN HCL 0.4 MG PO CAPS: 0.4000 mg | ORAL_CAPSULE | Freq: Every day | ORAL | 3 refills | Status: DC

## 2017-01-04 NOTE — Telephone Encounter (Signed)
Patient would like to try the medication first

## 2017-01-04 NOTE — Telephone Encounter (Signed)
Patient advised as below.  

## 2017-01-04 NOTE — Telephone Encounter (Signed)
tamsulosin sent into the pharmacy.

## 2017-01-20 ENCOUNTER — Encounter: Payer: Self-pay | Admitting: Family Medicine

## 2017-01-20 ENCOUNTER — Ambulatory Visit (INDEPENDENT_AMBULATORY_CARE_PROVIDER_SITE_OTHER): Payer: Medicare Other | Admitting: Family Medicine

## 2017-01-20 VITALS — BP 100/70 | HR 78 | Temp 97.8°F | Resp 16 | Wt 226.0 lb

## 2017-01-20 DIAGNOSIS — G5712 Meralgia paresthetica, left lower limb: Secondary | ICD-10-CM | POA: Diagnosis not present

## 2017-01-20 MED ORDER — PREDNISONE 10 MG PO TABS: ORAL_TABLET | ORAL | 0 refills | Status: DC

## 2017-01-20 NOTE — Patient Instructions (Signed)
Quit wearing a belt and minimize activity that puts pressure in the belt area.

## 2017-01-20 NOTE — Progress Notes (Signed)
Subjective:     Patient ID: Tom Moore, male   DOB: 1946/03/27, 71 y.o.   MRN: [id number]  HPI  Chief Complaint  Patient presents with  . Leg Pain    Patient comes in office today with complaints of pain in his left thigh intermittent for the past 2 weeks. Patient states "it feels like needles keep poking," associated with pain patient reports intermittent numbness.   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 States he must bend at times. Will feel the discomfort when he is flexing his leg but it resolves. with extension. No pain on w.b.and not present at o.v.today. He wears a belt with his pants but no work belt.   Review of Systems     Objective:   Physical Exam  Constitutional: He appears well-developed and well-nourished. No distress.  Musculoskeletal:  Muscle strength in lower extremities 5/5. SLR's to 8- degrees without pain or radiation of pain. Localizes his sx to his mid to lateral thigh to just above the knee. Knee FROM/ hip ER/IR without discomfort. No tenderness over trochanteric area.       Assessment:    1. Meralgia paresthetica of left side - predniSONE (DELTASONE) 10 MG tablet; Taper daily as follows: 6 pills, 5, 4, 3, 2, 1  Dispense: 21 tablet; Refill: 0    Plan:    Discussed not wearing a belt and minimizing activity which triggers his sx ie.bending. Start prednisone if the above does not help. Consider neurology evaluation.

## 2017-02-21 ENCOUNTER — Other Ambulatory Visit: Payer: Self-pay | Admitting: Family Medicine

## 2017-02-21 DIAGNOSIS — F329 Major depressive disorder, single episode, unspecified: Secondary | ICD-10-CM

## 2017-02-21 DIAGNOSIS — F32A Depression, unspecified: Secondary | ICD-10-CM

## 2017-02-28 ENCOUNTER — Encounter: Payer: Self-pay | Admitting: Family Medicine

## 2017-02-28 ENCOUNTER — Ambulatory Visit (INDEPENDENT_AMBULATORY_CARE_PROVIDER_SITE_OTHER): Payer: Medicare Other | Admitting: Family Medicine

## 2017-02-28 VITALS — BP 100/58 | HR 69 | Temp 98.1°F | Resp 16 | Wt 224.0 lb

## 2017-02-28 DIAGNOSIS — R202 Paresthesia of skin: Secondary | ICD-10-CM

## 2017-02-28 DIAGNOSIS — J069 Acute upper respiratory infection, unspecified: Secondary | ICD-10-CM

## 2017-02-28 DIAGNOSIS — G5712 Meralgia paresthetica, left lower limb: Secondary | ICD-10-CM

## 2017-02-28 MED ORDER — HYDROCODONE-HOMATROPINE 5-1.5 MG/5ML PO SYRP: ORAL_SOLUTION | ORAL | 0 refills | Status: DC

## 2017-02-28 NOTE — Progress Notes (Signed)
Subjective:     Patient ID: Tom Moore, male   DOB: 1946-06-22, 71 y.o.   MRN: [id number]  HPI  Chief Complaint  Patient presents with  . URI    Patient comes in office today with complaints of URI symptoms since 02/25/17. Patient reports runnys nose, sore throat and dry cough, he denies taking any otc medication.   . Leg Pain    Patient would like to readdress pain in both legs and numbness in both hands that has been intermittent for over 2 months.   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 Previously diagnosed at prior clinic visit with meralgia paresthetica, Tom Moore states that wearing his pants with a loose belt did not help his sx. Notes his sx primarily when he is standing at work. Gets relief by putting one leg up on a step. Also reports paresthesias in fingers of both hands. Has been undergoing Tx with methimazole for hyperthyroidism.   Review of Systems     Objective:   Physical Exam  Constitutional: He appears well-developed and well-nourished. No distress.  Musculoskeletal:  Grip strength 5/5 symmetrically. Phalen's test negative.  Ears: T.M's intact without inflammation Throat: tonsils absent Neck: no cervical adenopathy Lungs: clear     Assessment:    1. Viral upper respiratory tract infection - HYDROcodone-homatropine (HYCODAN) 5-1.5 MG/5ML syrup; 5 ml 4-6 hours as needed for cough  Dispense: 240 mL; Refill: 0  2. Meralgia paresthetica of left side - Ambulatory referral to Neurology  3. Paresthesias in left hand - CBC with Differential/Platelet - T4, free - TSH - Folate - B12 - VITAMIN D 25 Hydroxy (Vit-D Deficiency, Fractures)  4. Paresthesias in right hand - CBC with Differential/Platelet - T4, free - TSH - Folate - B12 - VITAMIN D 25 Hydroxy (Vit-D Deficiency, Fractures)    Plan:    Discussed use on saline nasal spray, Mucinex and Delsym. Further f/u pending lab work.

## 2017-02-28 NOTE — Patient Instructions (Addendum)
Discussed use of saline nasal spray, Mucinex for congestion and Delsym for cough.

## 2017-03-01 LAB — CBC WITH DIFFERENTIAL/PLATELET
BASOS: 0 %
Basophils Absolute: 0 10*3/uL (ref 0.0–0.2)
EOS (ABSOLUTE): 0.3 10*3/uL (ref 0.0–0.4)
Eos: 5 %
HEMOGLOBIN: 12.9 g/dL — AB (ref 13.0–17.7)
Hematocrit: 38.3 % (ref 37.5–51.0)
IMMATURE GRANS (ABS): 0 10*3/uL (ref 0.0–0.1)
Immature Granulocytes: 0 %
LYMPHS: 31 %
Lymphocytes Absolute: 2.2 10*3/uL (ref 0.7–3.1)
MCH: 30.1 pg (ref 26.6–33.0)
MCHC: 33.7 g/dL (ref 31.5–35.7)
MCV: 89 fL (ref 79–97)
MONOCYTES: 6 %
Monocytes Absolute: 0.4 10*3/uL (ref 0.1–0.9)
NEUTROS ABS: 4.1 10*3/uL (ref 1.4–7.0)
Neutrophils: 58 %
Platelets: 186 10*3/uL (ref 150–379)
RBC: 4.29 x10E6/uL (ref 4.14–5.80)
RDW: 14.7 % (ref 12.3–15.4)
WBC: 7 10*3/uL (ref 3.4–10.8)

## 2017-03-01 LAB — VITAMIN D 25 HYDROXY (VIT D DEFICIENCY, FRACTURES): VIT D 25 HYDROXY: 21.2 ng/mL — AB (ref 30.0–100.0)

## 2017-03-01 LAB — T4, FREE: FREE T4: 1.28 ng/dL (ref 0.82–1.77)

## 2017-03-01 LAB — VITAMIN B12: VITAMIN B 12: 488 pg/mL (ref 232–1245)

## 2017-03-01 LAB — TSH: TSH: 2.69 u[IU]/mL (ref 0.450–4.500)

## 2017-03-01 LAB — FOLATE: Folate: >20 ng/mL (ref >3.0)

## 2017-03-02 ENCOUNTER — Telehealth: Payer: Self-pay | Admitting: Family Medicine

## 2017-03-02 NOTE — Telephone Encounter (Signed)
Discussed lab results.

## 2017-03-21 ENCOUNTER — Ambulatory Visit (INDEPENDENT_AMBULATORY_CARE_PROVIDER_SITE_OTHER): Payer: Medicare Other | Admitting: Family Medicine

## 2017-03-21 ENCOUNTER — Encounter: Payer: Self-pay | Admitting: Family Medicine

## 2017-03-21 ENCOUNTER — Other Ambulatory Visit: Payer: Self-pay | Admitting: Family Medicine

## 2017-03-21 VITALS — BP 110/74 | HR 65 | Temp 98.1°F | Resp 16 | Wt 222.2 lb

## 2017-03-21 DIAGNOSIS — J0100 Acute maxillary sinusitis, unspecified: Secondary | ICD-10-CM

## 2017-03-21 DIAGNOSIS — N451 Epididymitis: Secondary | ICD-10-CM

## 2017-03-21 LAB — POCT URINALYSIS DIPSTICK
GLUCOSE UA: NEGATIVE
Ketones, UA: NEGATIVE
Leukocytes, UA: NEGATIVE
Nitrite, UA: NEGATIVE
RBC UA: NEGATIVE
SPEC GRAV UA: 1.01 (ref 1.010–1.025)
UROBILINOGEN UA: 0.2 U/dL
pH, UA: 5 (ref 5.0–8.0)

## 2017-03-21 MED ORDER — LEVOFLOXACIN 500 MG PO TABS: 500.0000 mg | ORAL_TABLET | Freq: Every day | ORAL | 0 refills | Status: DC

## 2017-03-21 NOTE — Patient Instructions (Addendum)
Your neurology appointment is with Dr. Manuella Ghazi at [facility]. Let me know if you are not improving.

## 2017-03-21 NOTE — Progress Notes (Signed)
Subjective:     Patient ID: Tom Moore, male   DOB: March 02, 1946, 71 y.o.   MRN: [id number]  HPI  Chief Complaint  Patient presents with  . Pelvic Pain    Patient comes in office today with concerns of pelvic pain radiating down to shaft of penis since 6/22, Patient denies any injury to area or any new sexual activity. Paitent states that when clothes touch his skin in his lower region it is sensitive to the tough. Patient states hat he briefly addressed at last visit and though it was his prostate but patient reports that everything came back normal, patient complains of frequency to urinate.   . Cough    Patient complains of cough and runnys nose for the past two weeks, patient states that he was previously prescribed medicine to treat symptoms but has not noticed any change  Reports decreased stream. Not sexually active since May and reported using a condom.  Patient reports increased sinus pressure, purulent sinus drainage, post nasal drainage and accompanying cough   Review of Systems     Objective:   Physical Exam  Constitutional: He appears well-developed and well-nourished. No distress.  Genitourinary:  Genitourinary Comments: Mild suprapubic discomfort. Penis is uncircumcised. Foreskin easily retractable, no lesions noted. Epididymis bilaterally with exquisite tenderness. Testicles do not appear to be inflamed.  Ears: T.M's intact without inflammation Sinuses: mild maxillary sinus tenderness Throat: no tonsillar enlargement or exudate Neck: no cervical adenopathy Lungs: clear     Assessment:    1. Acute non-recurrent maxillary sinusitis - levofloxacin (LEVAQUIN) 500 MG tablet; Take 1 tablet (500 mg total) by mouth daily.  Dispense: 10 tablet; Refill: 0  2. Epididymitis - POCT urinalysis dipstick - levofloxacin (LEVAQUIN) 500 MG tablet; Take 1 tablet (500 mg total) by mouth daily.  Dispense: 10 tablet; Refill: 0    Plan:    Work excuse for 6/25-6/27/18. Further f/u if  not improving.

## 2017-04-08 ENCOUNTER — Encounter: Payer: Self-pay | Admitting: Family Medicine

## 2017-05-04 ENCOUNTER — Telehealth: Payer: Self-pay

## 2017-05-04 NOTE — Telephone Encounter (Signed)
LMTCB  Thanks,  -Joseline 

## 2017-05-04 NOTE — Telephone Encounter (Signed)
LMTCB, per McKenzie need to cancel his AWE appointment with [facility] that scheduled for tomorrow 05/05/17. This is Bobs patient and he does not get this done for his patients-aa

## 2017-05-05 ENCOUNTER — Other Ambulatory Visit: Payer: Self-pay | Admitting: Family Medicine

## 2017-05-05 ENCOUNTER — Ambulatory Visit (INDEPENDENT_AMBULATORY_CARE_PROVIDER_SITE_OTHER): Payer: Medicare Other | Admitting: Family Medicine

## 2017-05-05 ENCOUNTER — Encounter: Payer: Self-pay | Admitting: Family Medicine

## 2017-05-05 ENCOUNTER — Ambulatory Visit: Payer: Medicare Other

## 2017-05-05 VITALS — BP 110/70 | HR 110 | Temp 98.1°F | Resp 16 | Ht 68.0 in | Wt 230.4 lb

## 2017-05-05 DIAGNOSIS — N183 Chronic kidney disease, stage 3 unspecified: Secondary | ICD-10-CM

## 2017-05-05 DIAGNOSIS — E0590 Thyrotoxicosis, unspecified without thyrotoxic crisis or storm: Secondary | ICD-10-CM

## 2017-05-05 DIAGNOSIS — I5022 Chronic systolic (congestive) heart failure: Secondary | ICD-10-CM

## 2017-05-05 DIAGNOSIS — E118 Type 2 diabetes mellitus with unspecified complications: Secondary | ICD-10-CM

## 2017-05-05 DIAGNOSIS — K219 Gastro-esophageal reflux disease without esophagitis: Secondary | ICD-10-CM | POA: Diagnosis not present

## 2017-05-05 DIAGNOSIS — E782 Mixed hyperlipidemia: Secondary | ICD-10-CM

## 2017-05-05 DIAGNOSIS — Z0000 Encounter for general adult medical examination without abnormal findings: Secondary | ICD-10-CM

## 2017-05-05 DIAGNOSIS — R351 Nocturia: Secondary | ICD-10-CM | POA: Diagnosis not present

## 2017-05-05 DIAGNOSIS — F32A Depression, unspecified: Secondary | ICD-10-CM

## 2017-05-05 DIAGNOSIS — I480 Paroxysmal atrial fibrillation: Secondary | ICD-10-CM

## 2017-05-05 DIAGNOSIS — I10 Essential (primary) hypertension: Secondary | ICD-10-CM

## 2017-05-05 DIAGNOSIS — F329 Major depressive disorder, single episode, unspecified: Secondary | ICD-10-CM

## 2017-05-05 DIAGNOSIS — R3915 Urgency of urination: Secondary | ICD-10-CM | POA: Diagnosis not present

## 2017-05-05 DIAGNOSIS — E1122 Type 2 diabetes mellitus with diabetic chronic kidney disease: Secondary | ICD-10-CM

## 2017-05-05 LAB — POCT URINALYSIS DIPSTICK
Blood, UA: NEGATIVE
GLUCOSE UA: 100
KETONES UA: NEGATIVE
LEUKOCYTES UA: NEGATIVE
Nitrite, UA: NEGATIVE
PH UA: 5 (ref 5.0–8.0)
Protein, UA: NEGATIVE
Spec Grav, UA: 1.02 (ref 1.010–1.025)
Urobilinogen, UA: 1 E.U./dL

## 2017-05-05 MED ORDER — VENLAFAXINE HCL ER 225 MG PO TB24: 1.0000 | ORAL_TABLET | Freq: Every day | ORAL | 0 refills | Status: DC

## 2017-05-05 NOTE — Progress Notes (Addendum)
Subjective:     Patient ID: Tom Moore, male   DOB: [birth year], 71 y.o.   MRN: [id number]  HPI  Chief Complaint  Patient presents with  . Annual Exam    Patient comes in office today for his annual physical he states that he feels well today and has no concerns or complaints. Patient reports that he has a poor eating habits but is working on improving, he is not actively exercising do to his work schedule, patient averages between 6-8hrs a night of sleep waking up frequently to use restroom. Patient reports that his last Tdap, Pneumovax and Colonoscopy  was given in 2012 when he lived in [place]. Patient has declined any vaccines today.   We will review prior records for immunization documentation. States she was sick for a week after previous shots and does not want any more. Current physicians include Dr. Nilda Simmer, endocrinology; Dr. Faye Ramsay, cardiology; and Dr. Manuella Ghazi, neurology. Has had recent Met C and A1c per his endocrinologist.   Review of Systems General: Feeling well HEENT: annual eye exam at [facility]. Edentulous with dentures Cardiovascular: no chest pain, shortness of breath, or palpitations GI: Heartburn controlled by medication., no change in bowel habits  GU: nocturia x 3-4 also with urinary urgency. Does not feel one capsule of Flomax helps. He is on two diuretics. Psychiatric:does not feel current dose of venlafaxine controlling depression Neurology: followed by neuro for leg paresthesias felt c/w meralgia paresthetica. Currently on gabapentin. Musculoskeletal: no joint pain reported                                             Objective:   Physical Exam  Constitutional: He appears well-developed and well-nourished. No distress.  Eyes: PERRLA, EOMI Neck: no thyromegaly, tenderness or nodules, no cervical adenopathy or carotid bruits ENT: TM's intact without inflammation;Tonsils absent Lungs: Clear Heart : RRR without murmur or gallop Abd: bowel sounds present, soft,  non-tender, no organomegaly Rectal: Prostate firm and non-tender. Minimal enlargement Extremities: no edema     Assessment:    1. Encounter for Medicare annual wellness exam  2. Mixed hyperlipidemia - Lipid panel  3. Depression, unspecified depression type: will increase dose of medication. - Venlafaxine HCl 225 MG TB24; Take 1 tablet (225 mg total) by mouth daily.  Dispense: 30 each; Refill: 0  4. Essential hypertension: determine continued need for supplements - Magnesium  5. Chronic systolic congestive heart failure [facility]): per cardiology  6. Gastroesophageal reflux disease without esophagitis: controlled with medication  7. Paroxysmal atrial fibrillation [facility]): per cariology  8. CKD stage 3 due to type 2 diabetes mellitus ([place]): continue ACE inhibitor/monitor  9. Subclinical hyperthyroidism: per endocrine  10. Controlled type 2 diabetes mellitus with complication, without long-term current use of insulin ([place]): per endocrine   11. Urinary urgency - POCT urinalysis dipstick  12. Nocturia - PSA    Plan:    Stop potassium supplement. Try two tamsulosin daily. Further f/u pending lab work and in two weeks.

## 2017-05-05 NOTE — Telephone Encounter (Signed)
lmtcb-aa 

## 2017-05-05 NOTE — Patient Instructions (Signed)
Stop potassium supplements and Niacin if you are still taking these. We will call you about the lab results. Try taking two tamsulosin daily and see if you get up less at night.

## 2017-05-05 NOTE — Telephone Encounter (Signed)
Patient has been seen by PCP. KW

## 2017-05-06 ENCOUNTER — Other Ambulatory Visit: Payer: Self-pay | Admitting: Family Medicine

## 2017-05-06 MED ORDER — VENLAFAXINE HCL ER 75 MG PO CP24: 75.0000 mg | ORAL_CAPSULE | Freq: Every day | ORAL | 1 refills | Status: DC

## 2017-05-06 MED ORDER — VENLAFAXINE HCL ER 150 MG PO CP24
150.0000 mg | ORAL_CAPSULE | Freq: Every day | ORAL | 1 refills | Status: DC
Start: 2017-05-06 — End: 2017-09-01

## 2017-05-12 ENCOUNTER — Encounter: Payer: Medicare Other | Admitting: Family Medicine

## 2017-05-17 ENCOUNTER — Telehealth: Payer: Self-pay

## 2017-05-17 LAB — LIPID PANEL
Chol/HDL Ratio: 3.1 ratio (ref 0.0–5.0)
Cholesterol, Total: 130 mg/dL (ref 100–199)
HDL: 42 mg/dL (ref >39)
LDL Calculated: 53 mg/dL (ref 0–99)
Triglycerides: 174 mg/dL — ABNORMAL HIGH (ref 0–149)
VLDL Cholesterol Cal: 35 mg/dL (ref 5–40)

## 2017-05-17 LAB — PSA: Prostate Specific Ag, Serum: 0.7 ng/mL (ref 0.0–4.0)

## 2017-05-17 LAB — MAGNESIUM: Magnesium: 1.9 mg/dL (ref 1.6–2.3)

## 2017-05-17 NOTE — Telephone Encounter (Signed)
-----   Message from Carmon Ginsberg, [place] sent at 05/17/2017  7:41 AM EDT ----- Labs ok. PSA and magnesium normal with well controlled cholesterol.

## 2017-05-17 NOTE — Telephone Encounter (Signed)
lmtcb . KW 

## 2017-05-19 ENCOUNTER — Encounter: Payer: Self-pay | Admitting: Family Medicine

## 2017-05-19 ENCOUNTER — Ambulatory Visit (INDEPENDENT_AMBULATORY_CARE_PROVIDER_SITE_OTHER): Payer: Medicare Other | Admitting: Family Medicine

## 2017-05-19 VITALS — BP 134/60 | HR 60 | Temp 97.5°F | Resp 16 | Wt 230.0 lb

## 2017-05-19 DIAGNOSIS — F329 Major depressive disorder, single episode, unspecified: Secondary | ICD-10-CM | POA: Diagnosis not present

## 2017-05-19 DIAGNOSIS — G5713 Meralgia paresthetica, bilateral lower limbs: Secondary | ICD-10-CM | POA: Diagnosis not present

## 2017-05-19 DIAGNOSIS — R351 Nocturia: Secondary | ICD-10-CM

## 2017-05-19 DIAGNOSIS — F32A Depression, unspecified: Secondary | ICD-10-CM

## 2017-05-19 NOTE — Patient Instructions (Addendum)
For depression take the venlafaxine 150 mg and 75 mg together. For your prostate try taking two of the tamsulosin (Flomax) once a day. If you still have to get up 4 x night call me for urology appointment. We will call you about the neurology appointment. Call me in 4 weeks about how you are doing on the medication for depression.

## 2017-05-19 NOTE — Progress Notes (Signed)
Subjective:     Patient ID: Tom Moore, male   DOB: 1945-09-30, 71 y.o.   MRN: [id number]  HPI  Chief Complaint  Patient presents with  . Depression    2 week follow up. On last visit, patient was advised to take Effexor 225mg . He feels that his symptoms are controlled on this dose.   States he misunderstood instructions from last visit and did not get the additional venlafaxine capsule. Also did not increase tamsulosin for nocturia. He wishes to change neurologists: "I don't trust him." States his meralgia is not improving on the gabapentin.   Review of Systems     Objective:   Physical Exam  Constitutional: He appears well-developed and well-nourished. No distress.       Assessment:    1. Nocturia  2. Depression, unspecified depression type  3. Meralgia paresthetica of both lower extremities - Ambulatory referral to Neurology    Plan:   Discussed proper dosing of venlafaxine and tamsulosin. Will call with second neurology referral.

## 2017-05-20 NOTE — Telephone Encounter (Signed)
Patient was advised on OV yesterday.

## 2017-06-01 ENCOUNTER — Other Ambulatory Visit: Payer: Self-pay | Admitting: Family Medicine

## 2017-06-02 ENCOUNTER — Other Ambulatory Visit: Payer: Self-pay | Admitting: Family Medicine

## 2017-06-27 ENCOUNTER — Other Ambulatory Visit: Payer: Self-pay | Admitting: Family Medicine

## 2017-06-28 NOTE — Telephone Encounter (Signed)
Bob's patient. Last OV 05/19/17. Last RF 05/06/17. Please review.

## 2017-07-21 ENCOUNTER — Encounter: Payer: Self-pay | Admitting: Family Medicine

## 2017-07-21 ENCOUNTER — Ambulatory Visit (INDEPENDENT_AMBULATORY_CARE_PROVIDER_SITE_OTHER): Payer: Medicare Other | Admitting: Family Medicine

## 2017-07-21 VITALS — BP 112/62 | HR 71 | Temp 97.7°F | Resp 16 | Wt 232.2 lb

## 2017-07-21 DIAGNOSIS — I5022 Chronic systolic (congestive) heart failure: Secondary | ICD-10-CM | POA: Diagnosis not present

## 2017-07-21 DIAGNOSIS — R351 Nocturia: Secondary | ICD-10-CM

## 2017-07-21 DIAGNOSIS — I480 Paroxysmal atrial fibrillation: Secondary | ICD-10-CM | POA: Diagnosis not present

## 2017-07-21 NOTE — Progress Notes (Signed)
Subjective:     Patient ID: Tom Moore, male   DOB: 1945-10-22, 71 y.o.   MRN: [id number]  HPI  Chief Complaint  Patient presents with  . Urinary Frequency    Patient comes in office to address urinary frequency in the PM , patient reports that he has to get up at night 4-7x to void and this has been occuring >3 weeks.   Recent normal PSA and Flomax trial (patient unsure he tried two pills). Currently on both furosemide and spironolactone. Also had recent asymptomatic episode  of bradycardia in the 40's while at the neurologist.   Review of Systems     Objective:   Physical Exam  Constitutional: He appears well-developed and well-nourished. No distress.       Assessment:    1. Nocturia - Ambulatory referral to Urology  2. Chronic systolic congestive heart failure ([place]) - Ambulatory referral to Cardiology  3. Paroxysmal atrial fibrillation ([place]) - Ambulatory referral to Cardiology    Plan:    Try two Flomax daily pending urology referral. He will schedule endocrine f/u for early November.

## 2017-07-21 NOTE — Patient Instructions (Signed)
Take two Flomax (tamsulosin) daily and see if it help with your night time frequency. We will call you with the urologist and cardiology referral.

## 2017-07-29 ENCOUNTER — Telehealth: Payer: Self-pay | Admitting: Family Medicine

## 2017-07-29 NOTE — Telephone Encounter (Signed)
fyi

## 2017-07-29 NOTE — Telephone Encounter (Signed)
FYI--Pt cancelled appointment at [facility]

## 2017-08-01 ENCOUNTER — Ambulatory Visit: Payer: Medicare Other | Admitting: Neurology

## 2017-08-05 ENCOUNTER — Ambulatory Visit: Payer: Medicare Other

## 2017-08-08 ENCOUNTER — Other Ambulatory Visit: Payer: Self-pay | Admitting: Family Medicine

## 2017-08-09 ENCOUNTER — Other Ambulatory Visit: Payer: Self-pay | Admitting: Family Medicine

## 2017-08-09 MED ORDER — OMEPRAZOLE 40 MG PO CPDR: 40.0000 mg | DELAYED_RELEASE_CAPSULE | Freq: Every day | ORAL | 1 refills | Status: DC

## 2017-08-15 ENCOUNTER — Ambulatory Visit: Payer: Medicare Other | Admitting: Urology

## 2017-08-15 ENCOUNTER — Encounter: Payer: Self-pay | Admitting: Urology

## 2017-08-15 VITALS — BP 117/73 | HR 120 | Ht 69.0 in | Wt 230.0 lb

## 2017-08-15 DIAGNOSIS — R351 Nocturia: Secondary | ICD-10-CM

## 2017-08-16 LAB — URINALYSIS, COMPLETE
Bilirubin, UA: NEGATIVE
Glucose, UA: NEGATIVE
LEUKOCYTES UA: NEGATIVE
NITRITE UA: NEGATIVE
Protein, UA: NEGATIVE
RBC, UA: NEGATIVE
SPEC GRAV UA: 1.025 (ref 1.005–1.030)
Urobilinogen, Ur: 1 mg/dL (ref 0.2–1.0)
pH, UA: 5.5 (ref 5.0–7.5)

## 2017-08-16 LAB — MICROSCOPIC EXAMINATION
EPITHELIAL CELLS (NON RENAL): NONE SEEN /HPF (ref 0–10)
RBC MICROSCOPIC, UA: NONE SEEN /HPF (ref 0–?)
WBC, UA: NONE SEEN /hpf (ref 0–?)

## 2017-08-17 ENCOUNTER — Encounter: Payer: Self-pay | Admitting: Urology

## 2017-08-17 DIAGNOSIS — R351 Nocturia: Secondary | ICD-10-CM | POA: Insufficient documentation

## 2017-08-17 NOTE — Progress Notes (Signed)
08/15/2017 7:26 AM   Tom Moore 05-27-1946 [id number]  Referring provider: Carmon Ginsberg, [place], [place] [address]  Chief Complaint  Patient presents with  . Nocturia    New Patient    HPI: Tom Moore is a 71 year old male referred for evaluation of nocturia.  He presents with a 40-month history of nocturia.  He gets up 4-5 times per night and states he has a large volume voids.  He has congestive heart failure and Lasix was recently increased to 80 mg which he takes in the morning.  He also complains of urinary frequency in the morning until around noon.  Denies dysuria or gross hematuria.  Denies flank, abdominal, pelvic or scrotal pain.  He does have urgency with occasional episodes of urge incontinence.  He was initially started on tamsulosin which was titrated to 0.8 mg without any significant improvement in his symptoms.   PMH: Past Medical History:  Diagnosis Date  . Anxiety   . Depression   . Diabetes mellitus without complication ([place])   . GERD (gastroesophageal reflux disease)   . Hyperlipidemia   . Hypertension   . Insomnia     Surgical History: History reviewed. No pertinent surgical history.  Home Medications:  Allergies as of 08/15/2017      Reactions   Zyrtec Allergy [cetirizine] Diarrhea      Medication List        Accurate as of 08/15/17 11:59 PM. Always use your most recent med list.          apixaban 5 MG Tabs tablet Commonly known as:  ELIQUIS Take 5 mg by mouth.   atorvastatin 40 MG tablet Commonly known as:  LIPITOR Take 40 mg by mouth daily.   B-D UF III MINI PEN NEEDLES 31G X 5 MM Misc Generic drug:  Insulin Pen Needle U UTD   diltiazem 120 MG 24 hr capsule Commonly known as:  CARDIZEM CD Take 120 mg by mouth.   furosemide 40 MG tablet Commonly known as:  LASIX Take 1 tablet (40 mg total) by mouth 2 (two) times daily.   gabapentin 300 MG capsule Commonly known as:  NEURONTIN Take by  mouth 2 (two) times daily.   glipiZIDE 10 MG tablet Commonly known as:  GLUCOTROL TAKE ONE TABLET BY MOUTH THREE TIMES DAILY BEFORE MEALS   insulin NPH Human 100 UNIT/ML injection Commonly known as:  HUMULIN N,NOVOLIN N Take 10 units BID 12 hours apart (8 am, 8 pm approximately) *Relion brand only please*   lisinopril 20 MG tablet Commonly known as:  PRINIVIL,ZESTRIL TK 2 TS PO D   Magnesium Oxide 400 MG Caps Take 1 capsule (400 mg total) by mouth 2 (two) times daily.   metFORMIN 1000 MG tablet Commonly known as:  GLUCOPHAGE TAKE 1 TABLET BY MOUTH TWICE DAILY WITH A MEAL   methimazole 5 MG tablet Commonly known as:  TAPAZOLE Take 5 mg by mouth.   metoprolol succinate 100 MG 24 hr tablet Commonly known as:  TOPROL-XL Take 2 tablets by mouth daily.   multivitamin-iron-minerals-folic acid Tabs tablet Take 1 tablet by mouth daily.   omeprazole 40 MG capsule Commonly known as:  PRILOSEC Take 1 capsule (40 mg total) daily by mouth.   spironolactone 25 MG tablet Commonly known as:  ALDACTONE TAKE 1 TABLET BY MOUTH EVERY DAY   tamsulosin 0.4 MG Caps capsule Commonly known as:  FLOMAX TAKE 1 CAPSULE BY MOUTH ONCE DAILY   triamcinolone cream  0.1 % Commonly known as:  KENALOG Apply to left ear rash 2-3 x day as needed for itching.   venlafaxine XR 150 MG 24 hr capsule Commonly known as:  EFFEXOR-XR Take 1 capsule (150 mg total) by mouth daily with breakfast.       Allergies:  Allergies  Allergen Reactions  . Zyrtec Allergy [Cetirizine] Diarrhea    Family History: Family History  Problem Relation Age of Onset  . Hypertension Mother   . Heart disease Mother     Social History:  reports that he has quit smoking. he has never used smokeless tobacco. He reports that he drinks alcohol. His drug history is not on file.  ROS: UROLOGY Frequent Urination?: Yes Hard to postpone urination?: Yes Burning/pain with urination?: No Get up at night to urinate?:  Yes Leakage of urine?: Yes Urine stream starts and stops?: No Trouble starting stream?: No Do you have to strain to urinate?: No Blood in urine?: No Urinary tract infection?: No Sexually transmitted disease?: No Injury to kidneys or bladder?: No Painful intercourse?: No Weak stream?: Yes Erection problems?: Yes Penile pain?: No  Gastrointestinal Nausea?: No Vomiting?: No Indigestion/heartburn?: No Diarrhea?: Yes Constipation?: No  Constitutional Fever: No Night sweats?: Yes Weight loss?: No Fatigue?: No  Skin Skin rash/lesions?: No Itching?: No  Eyes Blurred vision?: No Double vision?: No  Ears/Nose/Throat Sore throat?: No Sinus problems?: No  Hematologic/Lymphatic Swollen glands?: No Easy bruising?: No  Cardiovascular Leg swelling?: No Chest pain?: No  Respiratory Cough?: No Shortness of breath?: No  Endocrine Excessive thirst?: No  Musculoskeletal Back pain?: No Joint pain?: No  Neurological Headaches?: No Dizziness?: No  Psychologic Depression?: Yes Anxiety?: Yes  Physical Exam: BP 117/73   Pulse (!) 120   Ht 5\' 9"  (1.753 m)   Wt 230 lb (104.3 kg)   BMI 33.97 kg/m   Constitutional:  Alert and oriented, No acute distress. HEENT: [place] AT, moist mucus membranes.  Trachea midline, no masses. Cardiovascular: No clubbing, cyanosis, or edema. Respiratory: Normal respiratory effort, no increased work of breathing. GI: Abdomen is soft, nontender, nondistended, no abdominal masses GU: No CVA tenderness.  Penis uncircumcised without lesions, testes descended bilaterally without masses cord/epididymes palpably normal. Skin: No rashes, bruises or suspicious lesions.  Prostate 35 g, smooth without nodules. Lymph: No cervical or inguinal adenopathy. Neurologic: Grossly intact, no focal deficits, moving all 4 extremities. Psychiatric: Normal mood and affect.  Laboratory Data: Lab Results  Component Value Date   WBC 7.0 02/28/2017   HGB 12.9 (L)  02/28/2017   HCT 38.3 02/28/2017   MCV 89 02/28/2017   PLT 186 02/28/2017    Lab Results  Component Value Date   CREATININE 1.00 12/18/2015    Lab Results  Component Value Date   PSA1 0.7 05/16/2017   PSA1 0.7 12/31/2016     Lab Results  Component Value Date   HGBA1C 7.3 12/20/2016    Urinalysis Lab Results  Component Value Date   SPECGRAV 1.025 08/15/2017   PHUR 5.5 08/15/2017   COLORU Yellow 08/15/2017   APPEARANCEUR Clear 08/15/2017   LEUKOCYTESUR Negative 08/15/2017   PROTEINUR Negative 08/15/2017   GLUCOSEU Negative 08/15/2017   KETONESU 1+ (A) 08/15/2017   RBCU Negative 08/15/2017   BILIRUBINUR Negative 08/15/2017   UUROB 1.0 08/15/2017   NITRITE Negative 08/15/2017    Lab Results  Component Value Date   LABMICR See below: 08/15/2017   WBCUA None seen 08/15/2017   RBCUA None seen 08/15/2017   LABEPIT None  seen 08/15/2017   BACTERIA Few (A) 08/15/2017     Assessment & Plan:    1. Nocturia History consistent with nocturnal polyuria.  I have initially recommended he split his Lasix dose to 40 mg in the morning and 40 mg in late afternoon.  Follow-up 2-3 weeks for symptom recheck.  - Urinalysis, Complete   Return in about 2 weeks (around 08/29/2017).    Abbie Sons, [place] [address], [place], [place] [address] [phone]

## 2017-08-19 ENCOUNTER — Encounter: Payer: Self-pay | Admitting: Family Medicine

## 2017-08-19 ENCOUNTER — Ambulatory Visit: Payer: Medicare Other | Admitting: Family Medicine

## 2017-08-19 VITALS — BP 110/80 | HR 118 | Temp 97.7°F | Resp 16 | Wt 230.0 lb

## 2017-08-19 DIAGNOSIS — S39012A Strain of muscle, fascia and tendon of lower back, initial encounter: Secondary | ICD-10-CM

## 2017-08-19 DIAGNOSIS — J069 Acute upper respiratory infection, unspecified: Secondary | ICD-10-CM

## 2017-08-19 MED ORDER — HYDROCODONE-HOMATROPINE 5-1.5 MG/5ML PO SYRP: ORAL_SOLUTION | ORAL | 0 refills | Status: DC

## 2017-08-19 NOTE — Progress Notes (Signed)
Subjective:     Patient ID: Tom Moore, male   DOB: January 18, 1946, 71 y.o.   MRN: [id number] HPI  Chief Complaint  Patient presents with  . Back Pain    Patient C/O right lower back pain and radiates to right hip area since Wednesday. Patient reports he was sitting on his bed when symptoms started,  . Cough    C/O dry cough worse at night 's 3 days.  States PND giving him a tickle cough. 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   Review of Systems     Objective:   Physical Exam  Constitutional: He appears well-developed and well-nourished. No distress.  Musculoskeletal:  Muscle strength in lower extremities 5/5. SLR's to 80 degrees without radiation of back pain. Localizes to his right paravertebral lumbar area.  Ears: T.M's intact without inflammation Throat: tonsils absent Neck: no cervical adenopathy Lungs: clear     Assessment:    1. Viral upper respiratory tract infection: rx of Hycodan cough syrup #120 ml.to help with cough and back pain  2. Strain of lumbar region, initial encounter    Plan:    Discussed use of Coricidin and saline nasal spray. May also use Tylenol up to 3000 mg/day. Work excuse for 11/23-11/25/18.

## 2017-08-19 NOTE — Patient Instructions (Signed)
Discussed use Coricidin (for high bp) and salt water nasal spray. Cough syrup should help with back pain as well. May also use Tyenol up to 3000 mg /day.

## 2017-08-31 ENCOUNTER — Other Ambulatory Visit: Payer: Self-pay | Admitting: Physician Assistant

## 2017-08-31 ENCOUNTER — Other Ambulatory Visit: Payer: Self-pay | Admitting: Family Medicine

## 2017-09-01 ENCOUNTER — Encounter: Payer: Self-pay | Admitting: Family Medicine

## 2017-09-01 ENCOUNTER — Ambulatory Visit: Payer: Medicare Other | Admitting: Family Medicine

## 2017-09-01 ENCOUNTER — Other Ambulatory Visit: Payer: Self-pay | Admitting: Family Medicine

## 2017-09-01 ENCOUNTER — Ambulatory Visit: Payer: Medicare Other | Admitting: Urology

## 2017-09-01 ENCOUNTER — Encounter: Payer: Self-pay | Admitting: Urology

## 2017-09-01 VITALS — BP 120/84 | HR 43 | Temp 98.1°F | Resp 15 | Wt 228.2 lb

## 2017-09-01 VITALS — BP 116/76 | HR 67 | Ht 69.0 in | Wt 225.8 lb

## 2017-09-01 DIAGNOSIS — R351 Nocturia: Secondary | ICD-10-CM | POA: Diagnosis not present

## 2017-09-01 DIAGNOSIS — J40 Bronchitis, not specified as acute or chronic: Secondary | ICD-10-CM | POA: Diagnosis not present

## 2017-09-01 DIAGNOSIS — R1011 Right upper quadrant pain: Secondary | ICD-10-CM | POA: Diagnosis not present

## 2017-09-01 DIAGNOSIS — R001 Bradycardia, unspecified: Secondary | ICD-10-CM

## 2017-09-01 MED ORDER — VENLAFAXINE HCL ER 150 MG PO CP24: 150.0000 mg | ORAL_CAPSULE | Freq: Every day | ORAL | 1 refills | Status: DC

## 2017-09-01 MED ORDER — DOXYCYCLINE HYCLATE 100 MG PO TABS: 100.0000 mg | ORAL_TABLET | Freq: Two times a day (BID) | ORAL | 0 refills | Status: DC

## 2017-09-01 NOTE — Patient Instructions (Signed)
Decrease Metoprolol to one pill daily.

## 2017-09-01 NOTE — Progress Notes (Signed)
Subjective:     Patient ID: Tom Moore, male   DOB: [birth year], 71 y.o.   MRN: [id number] Chief Complaint  Patient presents with  . Abdominal Pain    Patient comes into office today with complaints of RLQ pain for the past 3 days, associated symptoms include diarrhea and gas.  Marland Kitchen URI    Follow up from 08/19/17 patient states tha tsymptoms have not improved since visit, he complains of cough, runny nose, shortness of breath on exertion and fatigue.   Reports clear sinus congestion and non-productive cough. Remains on two medications for heart rate control. States he has also had abdominal pain in his RUQ occasionally associated with meals. Normal colonoscopy in 2012. Pending f/u with urology and endocrine. HPI   Review of Systems  Genitourinary: Positive for frequency (especially nocturia).       Objective:   Physical Exam  Constitutional: He appears well-developed and well-nourished. No distress.  Abdominal: Soft. Bowel sounds are normal. There is no tenderness.  Ears: T.M's intact without inflammation Throat:tonsils absent Neck: no cervical adenopathy Lungs: clear     Assessment:    1. Bronchitis - doxycycline (VIBRA-TABS) 100 MG tablet; Take 1 tablet (100 mg total) by mouth 2 (two) times daily.  Dispense: 14 tablet; Refill: 0  2. Right upper quadrant abdominal pain - US Abdomen Limited RUQ; Future  3. Bradycardia: will decrease metoprolol t one tablet daily. Continue diltiazem.    Plan:    Work excuse for 12/6-12/7/18. Decreased metoprolol and f/u in one week.

## 2017-09-02 ENCOUNTER — Telehealth: Payer: Self-pay

## 2017-09-02 LAB — SODIUM: Sodium: 145 mmol/L — ABNORMAL HIGH (ref 134–144)

## 2017-09-02 MED ORDER — NOCDURNA 55.3 MCG SL SUBL: 55.3000 ug | SUBLINGUAL_TABLET | Freq: Every day | SUBLINGUAL | 3 refills | Status: DC

## 2017-09-02 NOTE — Progress Notes (Signed)
09/01/2017 7:36 AM   Tom Moore [birth year] [id number]  Referring provider: Carmon Ginsberg, [place], [place] [address]  Chief Complaint  Patient presents with  . Nocturia    HPI: 71 y.o. male presents for follow-up of nocturia.  Refer to my prior note dated 08/15/2017.  He saw no decrease in his nocturia with splitting his furosemide dose to morning and late afternoon.  He complains of nocturia x5 with fairly significant volumes.   PMH: Past Medical History:  Diagnosis Date  . Anxiety   . Depression   . Diabetes mellitus without complication ([place])   . GERD (gastroesophageal reflux disease)   . Hyperlipidemia   . Hypertension   . Insomnia     Surgical History: History reviewed. No pertinent surgical history.  Home Medications:  Allergies as of 09/01/2017      Reactions   Zyrtec Allergy [cetirizine] Diarrhea      Medication List        Accurate as of 09/01/17 11:59 PM. Always use your most recent med list.          apixaban 5 MG Tabs tablet Commonly known as:  ELIQUIS Take 5 mg by mouth.   atorvastatin 40 MG tablet Commonly known as:  LIPITOR Take 40 mg by mouth daily.   B-D UF III MINI PEN NEEDLES 31G X 5 MM Misc Generic drug:  Insulin Pen Needle U UTD   diltiazem 120 MG 24 hr capsule Commonly known as:  CARDIZEM CD Take 120 mg by mouth.   doxycycline 100 MG tablet Commonly known as:  VIBRA-TABS Take 1 tablet (100 mg total) by mouth 2 (two) times daily.   furosemide 40 MG tablet Commonly known as:  LASIX Take 1 tablet (40 mg total) by mouth 2 (two) times daily.   gabapentin 300 MG capsule Commonly known as:  NEURONTIN Take by mouth 2 (two) times daily.   glipiZIDE 10 MG tablet Commonly known as:  GLUCOTROL TAKE ONE TABLET BY MOUTH THREE TIMES DAILY BEFORE MEALS   HYDROcodone-homatropine 5-1.5 MG/5ML syrup Commonly known as:  HYCODAN 5 ml 4-6 hours as needed for cough   insulin NPH Human 100 UNIT/ML  injection Commonly known as:  HUMULIN N,NOVOLIN N Take 10 units BID 12 hours apart (8 am, 8 pm approximately) *Relion brand only please*   lisinopril 20 MG tablet Commonly known as:  PRINIVIL,ZESTRIL TK 2 TS PO D   Magnesium Oxide 400 MG Caps Take 1 capsule (400 mg total) by mouth 2 (two) times daily.   metFORMIN 1000 MG tablet Commonly known as:  GLUCOPHAGE TAKE 1 TABLET BY MOUTH TWICE DAILY WITH A MEAL   methimazole 5 MG tablet Commonly known as:  TAPAZOLE Take 5 mg by mouth.   metoprolol succinate 100 MG 24 hr tablet Commonly known as:  TOPROL-XL Take 2 tablets by mouth daily.   multivitamin-iron-minerals-folic acid Tabs tablet Take 1 tablet by mouth daily.   omeprazole 40 MG capsule Commonly known as:  PRILOSEC Take 1 capsule (40 mg total) daily by mouth.   spironolactone 25 MG tablet Commonly known as:  ALDACTONE TAKE 1 TABLET BY MOUTH EVERY DAY   tamsulosin 0.4 MG Caps capsule Commonly known as:  FLOMAX TAKE 1 CAPSULE BY MOUTH ONCE DAILY   triamcinolone cream 0.1 % Commonly known as:  KENALOG Apply to left ear rash 2-3 x day as needed for itching.   venlafaxine XR 75 MG 24 hr capsule Commonly known as:  EFFEXOR-XR  TAKE 1 CAPSULE BY MOUTH ONCE DAILY WITH BREAKFAST. TAKE WITH 150 MG CAPSULE FOR DAILY DOSE OF 225 MG.   venlafaxine XR 150 MG 24 hr capsule Commonly known as:  EFFEXOR-XR Take 1 capsule (150 mg total) by mouth daily with breakfast.       Allergies:  Allergies  Allergen Reactions  . Zyrtec Allergy [Cetirizine] Diarrhea    Family History: Family History  Problem Relation Age of Onset  . Hypertension Mother   . Heart disease Mother     Social History:  reports that he has quit smoking. he has never used smokeless tobacco. He reports that he drinks alcohol. His drug history is not on file.  ROS: UROLOGY Frequent Urination?: Yes Hard to postpone urination?: Yes Burning/pain with urination?: No Get up at night to urinate?:  Yes Leakage of urine?: Yes Urine stream starts and stops?: Yes Trouble starting stream?: No Do you have to strain to urinate?: No Blood in urine?: No Urinary tract infection?: No Sexually transmitted disease?: No Injury to kidneys or bladder?: No Painful intercourse?: No Weak stream?: No Erection problems?: No Penile pain?: No  Gastrointestinal Nausea?: No Vomiting?: No Indigestion/heartburn?: No Diarrhea?: Yes Constipation?: No  Constitutional Fever: No Night sweats?: No Weight loss?: No Fatigue?: No  Skin Skin rash/lesions?: No Itching?: No  Eyes Blurred vision?: No Double vision?: No  Ears/Nose/Throat Sore throat?: No Sinus problems?: No  Hematologic/Lymphatic Swollen glands?: No Easy bruising?: No  Cardiovascular Leg swelling?: No Chest pain?: No  Respiratory Cough?: Yes Shortness of breath?: Yes  Endocrine Excessive thirst?: No  Musculoskeletal Back pain?: No Joint pain?: No  Neurological Headaches?: No Dizziness?: No  Psychologic Depression?: Yes Anxiety?: Yes  Physical Exam: BP 116/76 (BP Location: Right Arm, Patient Position: Sitting, Cuff Size: Normal)   Pulse 67   Ht 5\' 9"  (1.753 m)   Wt 225 lb 12.8 oz (102.4 kg)   BMI 33.34 kg/m   Constitutional:  Alert and oriented, No acute distress. HEENT: [place] AT, moist mucus membranes.  Trachea midline, no masses. Cardiovascular: No clubbing, cyanosis, or edema. Respiratory: Normal respiratory effort, no increased work of breathing. GI: Abdomen is soft, nontender, nondistended, no abdominal masses GU: No CVA tenderness.  Skin: No rashes, bruises or suspicious lesions. Lymph: No cervical or inguinal adenopathy. Neurologic: Grossly intact, no focal deficits, moving all 4 extremities. Psychiatric: Normal mood and affect.  Laboratory Data: Lab Results  Component Value Date   WBC 7.0 02/28/2017   HGB 12.9 (L) 02/28/2017   HCT 38.3 02/28/2017   MCV 89 02/28/2017   PLT 186 02/28/2017     Lab Results  Component Value Date   CREATININE 1.00 12/18/2015    Lab Results  Component Value Date   PSA1 0.7 05/16/2017   PSA1 0.7 12/31/2016    Lab Results  Component Value Date   HGBA1C 7.3 12/20/2016    Urinalysis Lab Results  Component Value Date   SPECGRAV 1.025 08/15/2017   PHUR 5.5 08/15/2017   COLORU Yellow 08/15/2017   APPEARANCEUR Clear 08/15/2017   LEUKOCYTESUR Negative 08/15/2017   PROTEINUR Negative 08/15/2017   GLUCOSEU Negative 08/15/2017   KETONESU 1+ (A) 08/15/2017   RBCU Negative 08/15/2017   BILIRUBINUR Negative 08/15/2017   UUROB 1.0 08/15/2017   NITRITE Negative 08/15/2017    Lab Results  Component Value Date   LABMICR See below: 08/15/2017   WBCUA None seen 08/15/2017   RBCUA None seen 08/15/2017   LABEPIT None seen 08/15/2017   BACTERIA Few (A) 08/15/2017  Assessment & Plan:    1. Nocturia No improvement with splitting his furosemide dose to morning and late afternoon.  He was interested in a trial of desmopressin and a serum sodium level was ordered.  If normal he will start Nocdurna 55.3 mcg sublingual 1 hour before bedtime.  A sodium level will be checked 1 week after starting therapy and he will follow-up in 1 month for symptom reassessment and repeat sodium.  He was given samples and Rx will be sent to his pharmacy.  - Sodium; Future - Sodium; Future - Sodium   Return in about 4 weeks (around 09/29/2017) for Recheck, sodium level.  Abbie Sons, [facility] [address], [place], [place] [address] [phone]

## 2017-09-02 NOTE — Telephone Encounter (Signed)
-----   Message from Abbie Sons, MD sent at 09/02/2017  8:46 AM EST ----- Sodium level was normal.  He is okay to start [place].  Please make sure he takes sublingual 1 hour prior to bedtime.  Rx has been sent to pharmacy and will probably need prior Auth.

## 2017-09-02 NOTE — Telephone Encounter (Signed)
LMOM- labs are ok and can start nocdurna. Will also send a letter.

## 2017-09-06 ENCOUNTER — Ambulatory Visit: Payer: Medicare Other

## 2017-09-08 ENCOUNTER — Encounter: Payer: Self-pay | Admitting: Family Medicine

## 2017-09-08 ENCOUNTER — Telehealth: Payer: Self-pay | Admitting: Urology

## 2017-09-08 ENCOUNTER — Ambulatory Visit: Payer: Medicare Other | Admitting: Family Medicine

## 2017-09-08 ENCOUNTER — Ambulatory Visit
Admission: RE | Admit: 2017-09-08 | Discharge: 2017-09-08 | Disposition: A | Discharge status: Home / Self Care | Payer: Medicare Other | Source: Ambulatory Visit | Attending: Family Medicine | Admitting: Family Medicine

## 2017-09-08 ENCOUNTER — Other Ambulatory Visit: Payer: Medicare Other

## 2017-09-08 VITALS — BP 106/70 | HR 69 | Temp 97.6°F | Resp 16 | Wt 228.4 lb

## 2017-09-08 DIAGNOSIS — G4710 Hypersomnia, unspecified: Secondary | ICD-10-CM | POA: Diagnosis not present

## 2017-09-08 DIAGNOSIS — R351 Nocturia: Secondary | ICD-10-CM

## 2017-09-08 DIAGNOSIS — I5022 Chronic systolic (congestive) heart failure: Secondary | ICD-10-CM

## 2017-09-08 DIAGNOSIS — R0602 Shortness of breath: Secondary | ICD-10-CM

## 2017-09-08 DIAGNOSIS — R001 Bradycardia, unspecified: Secondary | ICD-10-CM

## 2017-09-08 IMAGING — CR DG CHEST 2V
1 series · 2 of 2 positions shown · non-contrast
Comparison: [DATE]

CLINICAL DATA: Cough and congestion and shortness of breath for 2
weeks.

EXAM:
CHEST  2 VIEW

[Series 1: dg chest 2 view · 0.14mm/px · 2 of 2 slices shown]
[im 1/2]
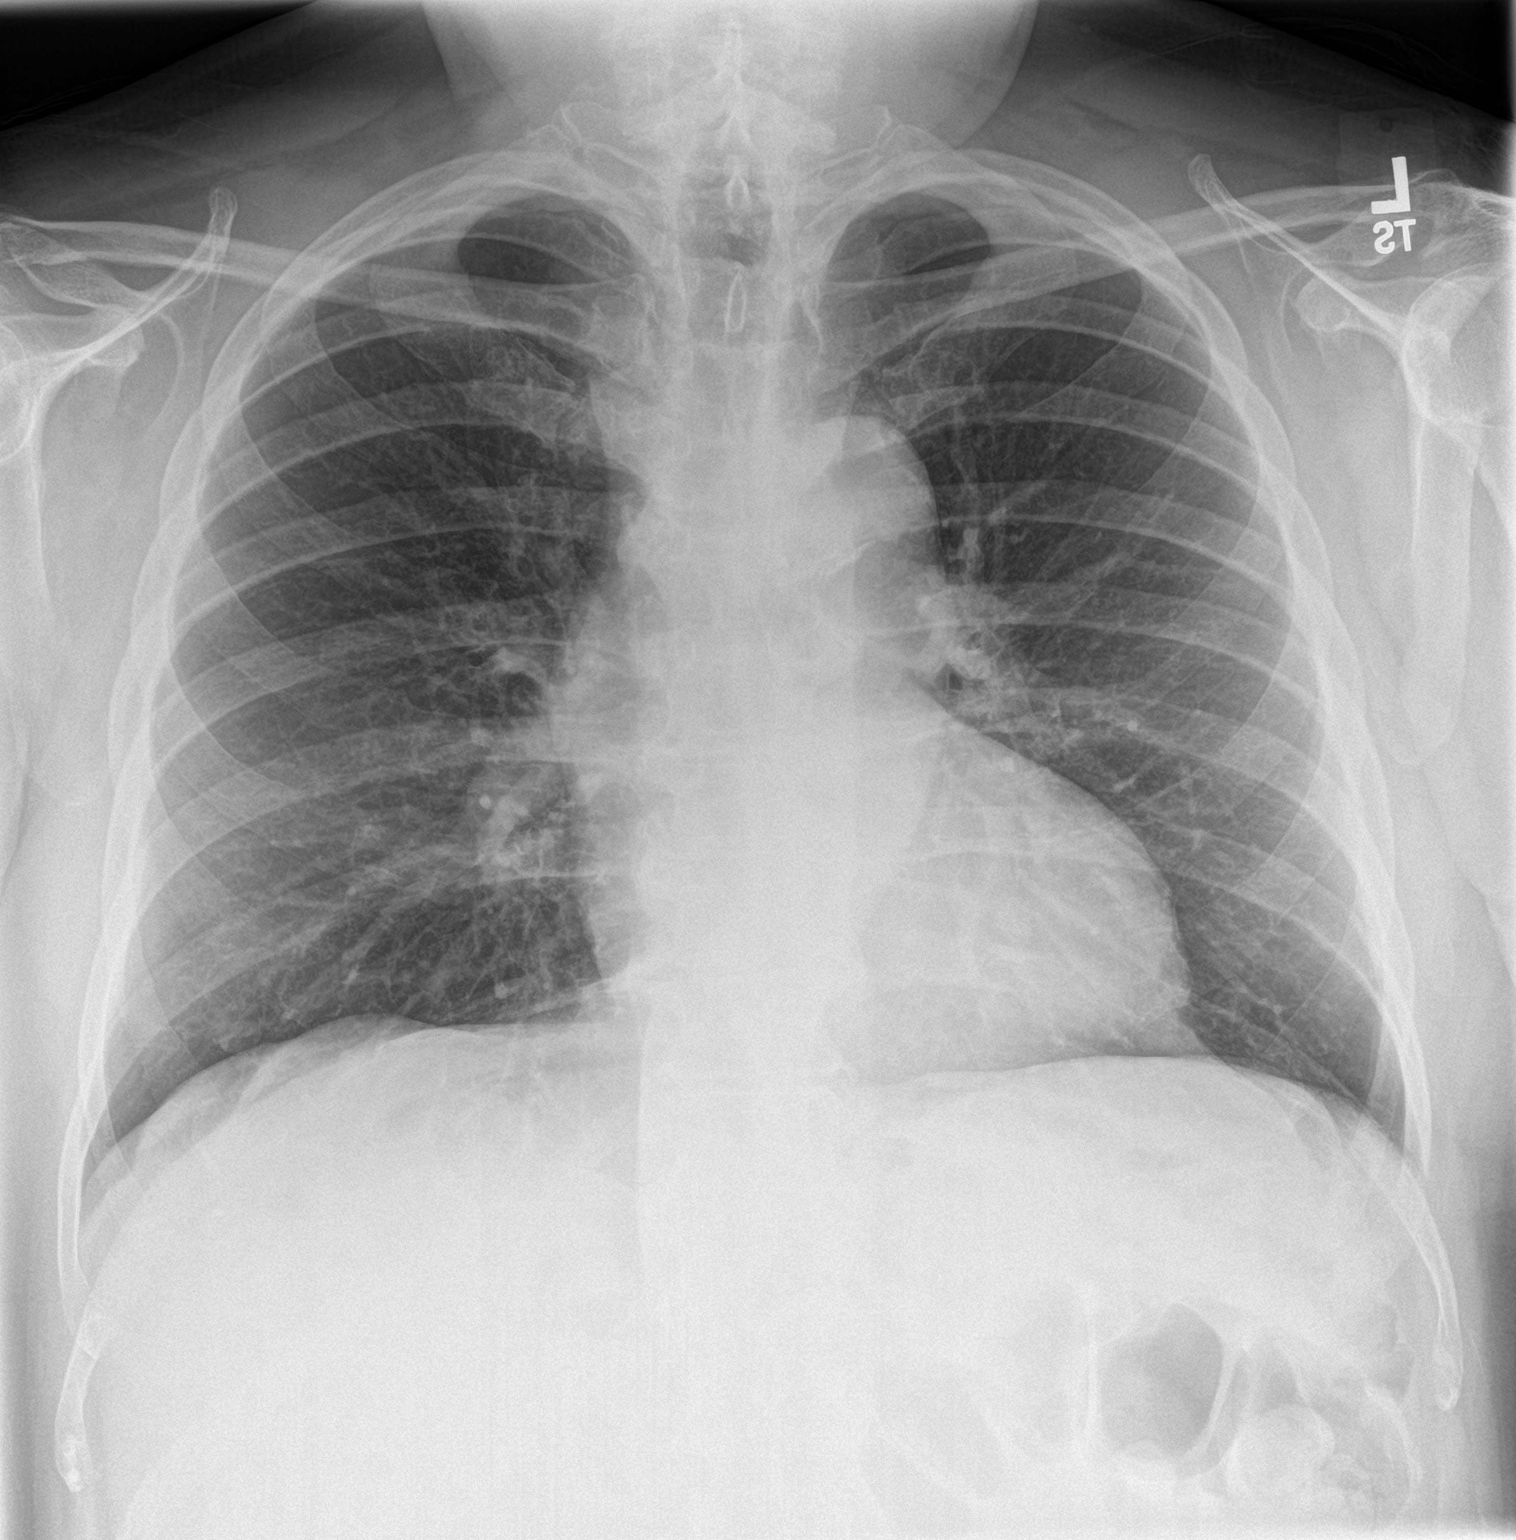
[im 2/2]
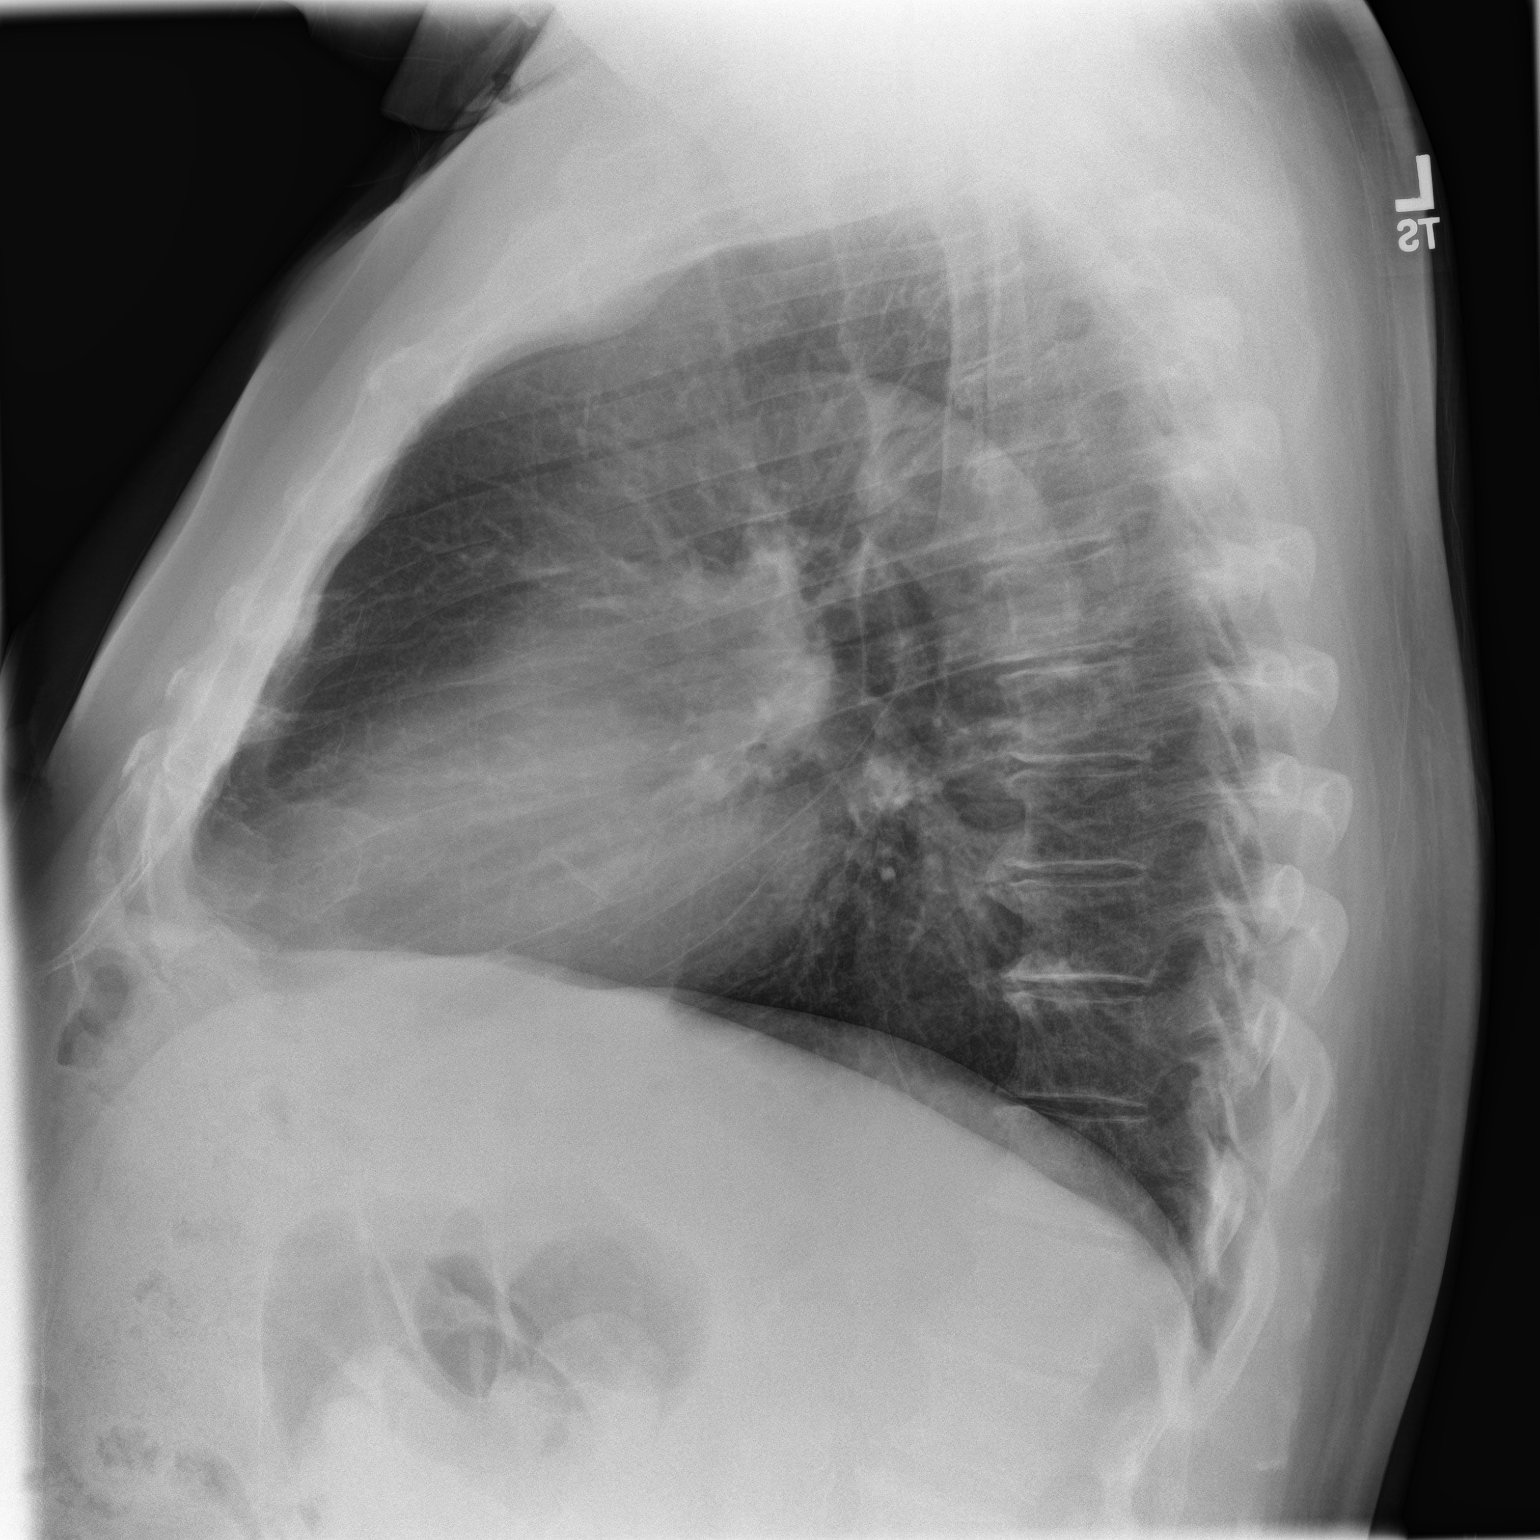

[2 of 2 positions shown; findings below may reference images not displayed]

FINDINGS: Both lungs are clear. No focal airspace disease or pulmonary edema.
Heart and mediastinum are within normal limits. Trachea is midline.
No pleural effusions. No acute bone abnormality.
IMPRESSION: No active cardiopulmonary disease.

## 2017-09-08 NOTE — Patient Instructions (Addendum)
Discontinue the 75 mg.dose of venlafaxine. We will call you with the x-ray report. Go up on the metoprolol to 1 and 1/2 pills daily.

## 2017-09-08 NOTE — Telephone Encounter (Signed)
Pt needs more samples.  He will be out Friday.  He's not sure which samples he was given.  Please give pt a call.  [phone]
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
## 2017-09-09 ENCOUNTER — Telehealth: Payer: Self-pay

## 2017-09-09 LAB — SODIUM: SODIUM: 146 mmol/L — AB (ref 134–144)

## 2017-09-09 NOTE — Telephone Encounter (Signed)
[id number] 

## 2017-09-09 NOTE — Telephone Encounter (Signed)
Patient advised, KW 

## 2017-09-09 NOTE — Telephone Encounter (Signed)
-----   Message from Abbie Sons, MD sent at 09/09/2017 10:02 AM EST ----- Sodium level was normal.  See if he has enough medication to last until his follow-up appointment.  I think I sent an Rx but not sure if it has been approved.

## 2017-09-09 NOTE — Telephone Encounter (Signed)
Spoke with pt in reference to lab results and rx sent to pharmacy. Pt voiced understanding.

## 2017-09-09 NOTE — Telephone Encounter (Signed)
-----   Message from Carmon Ginsberg, [place] sent at 09/09/2017  8:12 AM EST ----- Lungs look good. Will see how you are doing in a week with the metoprolol increased to 1.5 pills.

## 2017-09-09 NOTE — Telephone Encounter (Signed)
LMOM

## 2017-09-09 NOTE — Telephone Encounter (Signed)
See previous note

## 2017-09-12 ENCOUNTER — Ambulatory Visit: Payer: Medicare Other | Admitting: Family Medicine

## 2017-09-12 ENCOUNTER — Encounter: Payer: Self-pay | Admitting: Family Medicine

## 2017-09-12 VITALS — BP 108/72 | HR 101 | Temp 100.5°F | Resp 16 | Wt 230.8 lb

## 2017-09-12 DIAGNOSIS — J40 Bronchitis, not specified as acute or chronic: Secondary | ICD-10-CM

## 2017-09-12 MED ORDER — HYDROCODONE-HOMATROPINE 5-1.5 MG/5ML PO SYRP: ORAL_SOLUTION | ORAL | 0 refills | Status: DC

## 2017-09-12 MED ORDER — LEVOFLOXACIN 500 MG PO TABS: 500.0000 mg | ORAL_TABLET | Freq: Every day | ORAL | 0 refills | Status: DC

## 2017-09-12 NOTE — Patient Instructions (Signed)
Let me know if new symptoms or not improving. 

## 2017-09-12 NOTE — Progress Notes (Signed)
Subjective:     Patient ID: Tom Moore, male   DOB: 07-May-1946, 70 y.o.   MRN: [id number] Chief Complaint  Patient presents with  . Cough    Patient comes in office today with complaints of cough and fever for the past two days. Patient states that he has pain in his ribs and back due to cough, headache is associated with cough.    HPI Completed course of doxycycline approx. 4 days ago with little improvement in his cough. CXR clear. Now reports increased production of green sputum and mildly dizzy.  Review of Systems     Objective:   Physical Exam  Constitutional: He appears well-developed and well-nourished. No distress.  Ears: T.M's intact without inflammation Throat:tonsils absent Neck: no cervical adenopathy Lungs: clear     Assessment:    1. Bronchitis - levofloxacin (LEVAQUIN) 500 MG tablet; Take 1 tablet (500 mg total) by mouth daily.  Dispense: 7 tablet; Refill: 0 - HYDROcodone-homatropine (HYCODAN) 5-1.5 MG/5ML syrup; 5 ml 4-6 hours as needed for cough  Dispense: 120 mL; Refill: 0    Plan:    Call if new symptoms or not improving.

## 2017-09-15 ENCOUNTER — Ambulatory Visit: Payer: Self-pay | Admitting: Family Medicine

## 2017-09-22 ENCOUNTER — Ambulatory Visit: Payer: Medicare Other | Admitting: Family Medicine

## 2017-09-22 VITALS — BP 116/82 | HR 52 | Temp 98.0°F | Resp 18 | Wt 221.0 lb

## 2017-09-22 DIAGNOSIS — J40 Bronchitis, not specified as acute or chronic: Secondary | ICD-10-CM

## 2017-09-22 MED ORDER — PREDNISONE 20 MG PO TABS: ORAL_TABLET | ORAL | 0 refills | Status: DC

## 2017-09-22 NOTE — Progress Notes (Signed)
Subjective:     Patient ID: Tom Moore, male   DOB: [birth year], 71 y.o.   MRN: [id number] Chief Complaint  Patient presents with  . Cough    Dry cough, last week he was coughing up yellow/green mucus. He reports that he has been feeling bad for too long now. no fevers chills. He reports that he sometimes feels short of breath. He has been having sweats in the evenings.    HPI He is very frustrated over not feeling better and lost his job due to repeated sick absences. Reports dry cough, diminished appetite and sleepiness. Has felt wheezing on one occasion States purulent sputum cleared with Levaquin. Remains on medication including lisinopril for CHF but denies swelling  Review of Systems     Objective:   Physical Exam  Constitutional: He appears well-developed and well-nourished. No distress.  Cardiovascular:  Regularly irregular rhythm.  Pulmonary/Chest: Breath sounds normal.  Musculoskeletal: He exhibits no edema (of lower extremities).       Assessment:    1. Bronchitis: prednisone 40 mg.x 5 days - Ambulatory referral to Pulmonology    Plan:    If unable to get him in with pulmonary within a week will have M.D.here see him for a second opinion.

## 2017-09-22 NOTE — Patient Instructions (Signed)
We will call you about the pulmonary referral, If the appointment is a long way off may have one of the M.D.'s here give you a second opinion.

## 2017-09-29 ENCOUNTER — Encounter: Payer: Self-pay | Admitting: Family Medicine

## 2017-09-29 ENCOUNTER — Telehealth: Payer: Self-pay

## 2017-09-29 NOTE — Telephone Encounter (Signed)
Feels much better on prednisone. His job has been saved for him. Wrote a return to work letter today for 10/03/17.

## 2017-09-29 NOTE — Telephone Encounter (Signed)
Patient came up to the office stating that he was seen not long ago for a cold. He would like to get a note to go back to work on Monday 10/03/17. Patient is waiting in the waiting room now. Please review. Thank Edrick Kins, RMA

## 2017-10-04 ENCOUNTER — Telehealth: Payer: Self-pay | Admitting: Family Medicine

## 2017-10-04 NOTE — Telephone Encounter (Signed)
FYI--Pt cancelled and has not rescheduled appointment for abdominal ultrasound that was ordered Dec 2018

## 2017-10-04 NOTE — Telephone Encounter (Signed)
No, he is doing ok right now.

## 2017-10-04 NOTE — Telephone Encounter (Signed)
Would you like for me to contact patient? KW

## 2017-10-06 ENCOUNTER — Encounter: Payer: Self-pay | Admitting: Urology

## 2017-10-06 ENCOUNTER — Ambulatory Visit: Payer: Medicare Other | Admitting: Urology

## 2017-10-06 VITALS — BP 126/66 | HR 76 | Ht 69.0 in | Wt 218.0 lb

## 2017-10-06 DIAGNOSIS — R351 Nocturia: Secondary | ICD-10-CM | POA: Diagnosis not present

## 2017-10-07 ENCOUNTER — Telehealth: Payer: Self-pay

## 2017-10-07 ENCOUNTER — Encounter: Payer: Self-pay | Admitting: Urology

## 2017-10-07 LAB — SODIUM: Sodium: 147 mmol/L — ABNORMAL HIGH (ref 134–144)

## 2017-10-07 NOTE — Progress Notes (Signed)
10/06/2017 9:05 AM   Tom Moore 1946-03-29 [id number]  Referring provider: Carmon Ginsberg, [place], [place] [address]  Chief Complaint  Patient presents with  . Nocturia    1 month    HPI: Previously seen for high-volume nocturia 4-5 times per night which was significantly interrupting his sleep pattern.  No significant improvement in change in his diuretic to late afternoon.  He was given a trial of Nocdurna.  He has tolerated this medication well without hyponatremia or increased CHF symptoms.  He states his nocturia has decreased to 1 time per night and he is feeling much better.   PMH: Past Medical History:  Diagnosis Date  . Anxiety   . Depression   . Diabetes mellitus without complication ([place])   . GERD (gastroesophageal reflux disease)   . Hyperlipidemia   . Hypertension   . Insomnia     Surgical History: No past surgical history on file.  Home Medications:  Allergies as of 10/06/2017      Reactions   Zyrtec Allergy [cetirizine] Diarrhea      Medication List        Accurate as of 10/06/17 11:59 PM. Always use your most recent med list.          apixaban 5 MG Tabs tablet Commonly known as:  ELIQUIS Take 5 mg by mouth.   atorvastatin 40 MG tablet Commonly known as:  LIPITOR Take 40 mg by mouth daily.   B-D UF III MINI PEN NEEDLES 31G X 5 MM Misc Generic drug:  Insulin Pen Needle U UTD   diltiazem 120 MG 24 hr capsule Commonly known as:  CARDIZEM CD Take 120 mg by mouth.   furosemide 40 MG tablet Commonly known as:  LASIX Take 1 tablet (40 mg total) by mouth 2 (two) times daily.   glipiZIDE 10 MG tablet Commonly known as:  GLUCOTROL TAKE ONE TABLET BY MOUTH THREE TIMES DAILY BEFORE MEALS   HYDROcodone-homatropine 5-1.5 MG/5ML syrup Commonly known as:  HYCODAN 5 ml 4-6 hours as needed for cough   insulin NPH Human 100 UNIT/ML injection Commonly known as:  HUMULIN N,NOVOLIN N Take 10 units BID 12 hours  apart (8 am, 8 pm approximately) *Relion brand only please*   levofloxacin 500 MG tablet Commonly known as:  LEVAQUIN Take 1 tablet (500 mg total) by mouth daily.   lisinopril 20 MG tablet Commonly known as:  PRINIVIL,ZESTRIL TK 2 TS PO D   Magnesium Oxide 400 MG Caps Take 1 capsule (400 mg total) by mouth 2 (two) times daily.   metFORMIN 1000 MG tablet Commonly known as:  GLUCOPHAGE TAKE 1 TABLET BY MOUTH TWICE DAILY WITH A MEAL   methimazole 5 MG tablet Commonly known as:  TAPAZOLE Take 5 mg by mouth.   metoprolol succinate 100 MG 24 hr tablet Commonly known as:  TOPROL-XL Take 2 tablets by mouth daily.   multivitamin-iron-minerals-folic acid Tabs tablet Take 1 tablet by mouth daily.   NOCDURNA 55.3 MCG Subl Generic drug:  Desmopressin Acetate Place 55.3 mcg under the tongue at bedtime.   omeprazole 40 MG capsule Commonly known as:  PRILOSEC Take 1 capsule (40 mg total) daily by mouth.   predniSONE 20 MG tablet Commonly known as:  DELTASONE One pill twice daily for 5 days   spironolactone 25 MG tablet Commonly known as:  ALDACTONE TAKE 1 TABLET BY MOUTH EVERY DAY   tamsulosin 0.4 MG Caps capsule Commonly known as:  FLOMAX  TAKE 1 CAPSULE BY MOUTH ONCE DAILY   triamcinolone cream 0.1 % Commonly known as:  KENALOG Apply to left ear rash 2-3 x day as needed for itching.   venlafaxine XR 150 MG 24 hr capsule Commonly known as:  EFFEXOR-XR Take 1 capsule (150 mg total) by mouth daily with breakfast.       Allergies:  Allergies  Allergen Reactions  . Zyrtec Allergy [Cetirizine] Diarrhea    Family History: Family History  Problem Relation Age of Onset  . Hypertension Mother   . Heart disease Mother     Social History:  reports that he has quit smoking. he has never used smokeless tobacco. He reports that he drinks alcohol. His drug history is not on file.  ROS: UROLOGY Frequent Urination?: No Hard to postpone urination?: No Burning/pain with  urination?: No Get up at night to urinate?: No Leakage of urine?: Yes Urine stream starts and stops?: No Trouble starting stream?: No Do you have to strain to urinate?: No Blood in urine?: No Urinary tract infection?: No Sexually transmitted disease?: No Injury to kidneys or bladder?: No Painful intercourse?: No Weak stream?: No Erection problems?: Yes Penile pain?: No  Gastrointestinal Nausea?: No Vomiting?: No Indigestion/heartburn?: No Diarrhea?: No Constipation?: No  Constitutional Fever: No Night sweats?: No Weight loss?: No Fatigue?: No  Skin Skin rash/lesions?: No Itching?: No  Eyes Blurred vision?: No Double vision?: No  Ears/Nose/Throat Sore throat?: No Sinus problems?: No  Hematologic/Lymphatic Swollen glands?: No Easy bruising?: No  Cardiovascular Leg swelling?: No Chest pain?: No  Respiratory Cough?: No Shortness of breath?: No  Endocrine Excessive thirst?: No  Musculoskeletal Back pain?: No Joint pain?: No  Neurological Headaches?: No Dizziness?: No  Psychologic Depression?: Yes Anxiety?: No  Physical Exam: BP 126/66   Pulse 76   Ht 5\' 9"  (1.753 m)   Wt 218 lb (98.9 kg)   BMI 32.19 kg/m   Constitutional:  Alert and oriented, No acute distress. HEENT: [place] AT, moist mucus membranes.  Trachea midline, no masses. Cardiovascular: No clubbing, cyanosis, or edema. Respiratory: Normal respiratory effort, no increased work of breathing. GI: Abdomen is soft, nontender, nondistended, no abdominal masses GU: No CVA tenderness.  Skin: No rashes, bruises or suspicious lesions. Lymph: No cervical or inguinal adenopathy. Neurologic: Grossly intact, no focal deficits, moving all 4 extremities. Psychiatric: Normal mood and affect.  Laboratory Data: Lab Results  Component Value Date   WBC 7.0 02/28/2017   HGB 12.9 (L) 02/28/2017   HCT 38.3 02/28/2017   MCV 89 02/28/2017   PLT 186 02/28/2017    Lab Results  Component Value Date     CREATININE 1.00 12/18/2015    Lab Results  Component Value Date   PSA1 0.7 05/16/2017   PSA1 0.7 12/31/2016     Lab Results  Component Value Date   HGBA1C 7.3 12/20/2016    Urinalysis Lab Results  Component Value Date   SPECGRAV 1.025 08/15/2017   PHUR 5.5 08/15/2017   COLORU Yellow 08/15/2017   APPEARANCEUR Clear 08/15/2017   LEUKOCYTESUR Negative 08/15/2017   PROTEINUR Negative 08/15/2017   GLUCOSEU Negative 08/15/2017   KETONESU 1+ (A) 08/15/2017   RBCU Negative 08/15/2017   BILIRUBINUR Negative 08/15/2017   UUROB 1.0 08/15/2017   NITRITE Negative 08/15/2017    Lab Results  Component Value Date   LABMICR See below: 08/15/2017   WBCUA None seen 08/15/2017   RBCUA None seen 08/15/2017   LABEPIT None seen 08/15/2017   BACTERIA Few (A) 08/15/2017  Assessment & Plan:    1. Nocturia Significant improvement on Nocdurna without side effects or complications.  Repeat sodium level will be drawn today and if normal he would like to continue this medication.  - Sodium   Return in about 6 months (around 04/05/2018) for Recheck.  Abbie Sons, [place] [address] [place], [place] [address] [phone]

## 2017-10-07 NOTE — Telephone Encounter (Signed)
Patient notified on vmail 

## 2017-10-07 NOTE — Telephone Encounter (Signed)
-----   Message from Abbie Sons, MD sent at 10/07/2017  9:11 AM EST ----- Sodium level looks good at 147.

## 2017-11-01 ENCOUNTER — Telehealth: Payer: Self-pay

## 2017-11-01 NOTE — Telephone Encounter (Signed)
Patient called office requesting a refill on Omeprazole 40mg  qd, sent to [facility] on [address]. KW

## 2017-11-01 NOTE — Telephone Encounter (Signed)
Patient has been advised. KW 

## 2017-11-01 NOTE — Telephone Encounter (Signed)
Should have a refill left. 

## 2017-11-10 ENCOUNTER — Telehealth: Payer: Self-pay | Admitting: Urology

## 2017-11-10 NOTE — Telephone Encounter (Signed)
Pt needs refill ofNocdurna 27.7 mcg to [facility] on [address].  Please call pt [phone]

## 2017-11-11 MED ORDER — NOCDURNA 55.3 MCG SL SUBL: 55.3000 ug | SUBLINGUAL_TABLET | Freq: Every day | SUBLINGUAL | 3 refills | Status: DC

## 2017-11-11 NOTE — Telephone Encounter (Signed)
Medication sent to pharmacy  

## 2017-11-25 DIAGNOSIS — G5603 Carpal tunnel syndrome, bilateral upper limbs: Secondary | ICD-10-CM | POA: Insufficient documentation

## 2017-12-07 ENCOUNTER — Encounter: Payer: Self-pay | Admitting: Family Medicine

## 2017-12-13 ENCOUNTER — Telehealth: Payer: Self-pay | Admitting: Urology

## 2017-12-13 MED ORDER — NOCDURNA 55.3 MCG SL SUBL: 55.3000 ug | SUBLINGUAL_TABLET | Freq: Every day | SUBLINGUAL | 3 refills | Status: DC

## 2017-12-13 MED ORDER — OXYBUTYNIN CHLORIDE 5 MG PO TABS: ORAL_TABLET | ORAL | 0 refills | Status: DC

## 2017-12-13 NOTE — Telephone Encounter (Signed)
This is the only medication currently approved for getting up at night.  I did send in an alternative medication he can try which should be much cheaper but may not be as effective.

## 2017-12-13 NOTE — Telephone Encounter (Signed)
Pt stopped by office and wants to know if there is anything equivalent to Nocdurna?  His insurance doesn't cover this and it's over $400.  Please advise.

## 2018-01-10 ENCOUNTER — Other Ambulatory Visit: Payer: Self-pay | Admitting: Family Medicine

## 2018-01-10 ENCOUNTER — Ambulatory Visit: Payer: Medicare Other | Admitting: Family Medicine

## 2018-01-10 ENCOUNTER — Encounter: Payer: Self-pay | Admitting: Family Medicine

## 2018-01-10 VITALS — BP 122/72 | HR 72 | Temp 98.1°F | Resp 16 | Wt 225.2 lb

## 2018-01-10 DIAGNOSIS — S29012A Strain of muscle and tendon of back wall of thorax, initial encounter: Secondary | ICD-10-CM

## 2018-01-10 DIAGNOSIS — R5383 Other fatigue: Secondary | ICD-10-CM | POA: Diagnosis not present

## 2018-01-10 DIAGNOSIS — G4733 Obstructive sleep apnea (adult) (pediatric): Secondary | ICD-10-CM

## 2018-01-10 NOTE — Progress Notes (Signed)
Subjective:     Patient ID: Tom Moore, male   DOB: March 02, 1946, 72 y.o.   MRN: [id number] Chief Complaint  Patient presents with  . Fatigue    Patient comes in office today with complaints of fatigue for several months, patient states that it has been increasingly getting worse and is interferring with his daily activities. Patient reports that he quit his job 3 days because he was having hard time staying awake at work. Patient reports that he sleeps ov average 7hrs a night.   . Back Pain    Patient complaints of upper back pain on the right side radiating to the mid of his back which he deswcribes as a sharp pain.    HPI States he was raising his right arm repetitively at work to operate a machine. Previously diagnosed with sleep apnea years ago but could not tolerate the mask. Reports sugars have been under control until he ate too much in the last day or two. He reports he is pending bilateral carpal tunnel surgery.  Review of Systems     Objective:   Physical Exam  Constitutional: He appears well-developed and well-nourished. No distress.  Cardiovascular: Normal rate and regular rhythm.  Pulmonary/Chest: Breath sounds normal.  Abdominal: Soft. There is no tenderness. There is no guarding.  Musculoskeletal: He exhibits no edema (of lower extremites).  Tender over his right mid-thoracic paravertebral  Back. No rash.       Assessment:    1. Strain of thoracic back region  2. Fatigue, unspecified type - Comprehensive metabolic panel - T4, free - TSH - CBC with Differential/Platelet  3. OSA (obstructive sleep apnea)    Plan:    Further f/u pending lab work. Discussed use of Tylenol and heating pad for his back strain. Consider oral appliance for sleep apnea.

## 2018-01-10 NOTE — Patient Instructions (Signed)
Discussed use of Tylenol and heating pad for 20 minutes to your back. We will call you with the lab results. Consider an oral appliance for your sleep apnea.

## 2018-01-11 ENCOUNTER — Telehealth: Payer: Self-pay

## 2018-01-11 ENCOUNTER — Other Ambulatory Visit: Payer: Self-pay | Admitting: Family Medicine

## 2018-01-11 LAB — COMPREHENSIVE METABOLIC PANEL
A/G RATIO: 1.8 (ref 1.2–2.2)
ALT: 22 IU/L (ref 0–44)
AST: 20 IU/L (ref 0–40)
Albumin: 4.2 g/dL (ref 3.5–4.8)
Alkaline Phosphatase: 123 IU/L — ABNORMAL HIGH (ref 39–117)
BILIRUBIN TOTAL: 0.4 mg/dL (ref 0.0–1.2)
BUN/Creatinine Ratio: 16 (ref 10–24)
BUN: 22 mg/dL (ref 8–27)
CHLORIDE: 101 mmol/L (ref 96–106)
CO2: 25 mmol/L (ref 20–29)
Calcium: 9.3 mg/dL (ref 8.6–10.2)
Creatinine, Ser: 1.37 mg/dL — ABNORMAL HIGH (ref 0.76–1.27)
GFR calc Af Amer: 60 mL/min/{1.73_m2} (ref >59)
GFR calc non Af Amer: 52 mL/min/{1.73_m2} — ABNORMAL LOW (ref >59)
Globulin, Total: 2.4 g/dL (ref 1.5–4.5)
Glucose: 238 mg/dL — ABNORMAL HIGH (ref 65–99)
POTASSIUM: 5.5 mmol/L — AB (ref 3.5–5.2)
Sodium: 143 mmol/L (ref 134–144)
Total Protein: 6.6 g/dL (ref 6.0–8.5)

## 2018-01-11 LAB — CBC WITH DIFFERENTIAL/PLATELET
BASOS ABS: 0 10*3/uL (ref 0.0–0.2)
Basos: 0 %
EOS (ABSOLUTE): 0.4 10*3/uL (ref 0.0–0.4)
EOS: 8 %
HEMATOCRIT: 37.6 % (ref 37.5–51.0)
Hemoglobin: 12.3 g/dL — ABNORMAL LOW (ref 13.0–17.7)
Immature Grans (Abs): 0 10*3/uL (ref 0.0–0.1)
Immature Granulocytes: 0 %
LYMPHS ABS: 1.4 10*3/uL (ref 0.7–3.1)
Lymphs: 28 %
MCH: 29.9 pg (ref 26.6–33.0)
MCHC: 32.7 g/dL (ref 31.5–35.7)
MCV: 92 fL (ref 79–97)
MONOS ABS: 0.4 10*3/uL (ref 0.1–0.9)
Monocytes: 7 %
NEUTROS ABS: 2.9 10*3/uL (ref 1.4–7.0)
Neutrophils: 57 %
Platelets: 176 10*3/uL (ref 150–379)
RBC: 4.11 x10E6/uL — AB (ref 4.14–5.80)
RDW: 14.8 % (ref 12.3–15.4)
WBC: 5.1 10*3/uL (ref 3.4–10.8)

## 2018-01-11 LAB — T4, FREE: Free T4: 1.07 ng/dL (ref 0.82–1.77)

## 2018-01-11 LAB — TSH: TSH: 0.271 u[IU]/mL — AB (ref 0.450–4.500)

## 2018-01-11 NOTE — Telephone Encounter (Signed)
Patient declined and states that he will figure out another solution. KW

## 2018-01-11 NOTE — Telephone Encounter (Signed)
I advised patient of labs he states he refuses to do another Epworth screen, patient states that he knows he suffers from sleep apnea and was told previously that he needs a Cpap machine. Patient states that he cannot sleep with mask on face, patient reports that his problem is falling asleep during the day . Patient wants to know if there is a medication that can be prescribed for fatigue? Please advise.

## 2018-01-11 NOTE — Telephone Encounter (Signed)
I need to update his sleep apnea in some way so I can refer to a dentist for an oral device if he wishes to proceed that way. There is not a medication for this. If he wishes another opinion about this have him see Dr. Darnell Level. Or Dr. Wanda Plump.

## 2018-01-11 NOTE — Telephone Encounter (Signed)
-----   Message from Carmon Ginsberg, [place] sent at 01/11/2018  7:53 AM EDT ----- Labs are ok. I think sleep apnea may be contributing to your fatigue. I could not find your old sleep study results. Would recommend coming by and filling out the written Epworth screen for sleep apnea.

## 2018-01-31 ENCOUNTER — Other Ambulatory Visit: Payer: Self-pay | Admitting: Family Medicine

## 2018-03-07 ENCOUNTER — Telehealth: Payer: Self-pay | Admitting: Family Medicine

## 2018-03-07 NOTE — Telephone Encounter (Signed)
Received [facility] Medical Clearance form. Placed form in providers box. Thanks CC ° °

## 2018-03-07 NOTE — Telephone Encounter (Signed)
Advised patient appt made for 03/09/18. Tom Moore

## 2018-03-07 NOTE — Telephone Encounter (Signed)
He will need a pre-op exam scheduled. Will have to update his EKG as well. He has had most of the requested labs recently except for U/A and EKG.

## 2018-03-07 NOTE — Telephone Encounter (Signed)
FYI. KW 

## 2018-03-09 ENCOUNTER — Ambulatory Visit: Payer: Medicare Other | Admitting: Family Medicine

## 2018-03-09 ENCOUNTER — Encounter: Payer: Self-pay | Admitting: Family Medicine

## 2018-03-09 ENCOUNTER — Telehealth: Payer: Self-pay | Admitting: Family Medicine

## 2018-03-09 VITALS — BP 114/80 | HR 87 | Temp 97.6°F | Resp 15 | Wt 221.6 lb

## 2018-03-09 DIAGNOSIS — E118 Type 2 diabetes mellitus with unspecified complications: Secondary | ICD-10-CM | POA: Diagnosis not present

## 2018-03-09 DIAGNOSIS — Z01818 Encounter for other preprocedural examination: Secondary | ICD-10-CM

## 2018-03-09 LAB — POCT URINALYSIS DIPSTICK
BILIRUBIN UA: NEGATIVE
Blood, UA: NEGATIVE
GLUCOSE UA: NEGATIVE
KETONES UA: NEGATIVE
Leukocytes, UA: NEGATIVE
Nitrite, UA: NEGATIVE
PH UA: 8 (ref 5.0–8.0)
Protein, UA: NEGATIVE
Spec Grav, UA: <=1.005 — AB (ref 1.010–1.025)
Urobilinogen, UA: 2 E.U./dL — AB

## 2018-03-09 LAB — POCT GLYCOSYLATED HEMOGLOBIN (HGB A1C): Hemoglobin A1C: 6.8 % — AB (ref 4.0–5.6)

## 2018-03-09 NOTE — Telephone Encounter (Signed)
OPEN IN ERROR 

## 2018-03-09 NOTE — Progress Notes (Signed)
  Subjective:     Patient ID: Tom Moore, male   DOB: [birth year], 72 y.o.   MRN: [id number] Chief Complaint  Patient presents with  . Pre-op Exam    Patient comes in office today for pre-op examination, patient is scheduled to have surgery on hs right hand 04/10/18.   . Diabetes    patient returns to office today for follow up   HPI   Review of Systems  Respiratory: Negative for shortness of breath.   Cardiovascular: Negative for chest pain and palpitations.       Objective:   Physical Exam  Constitutional: He appears well-developed and well-nourished. No distress.  Cardiovascular: Normal rate and regular rhythm.  Pulmonary/Chest: Breath sounds normal.  Abdominal: Soft. Bowel sounds are normal.  Musculoskeletal: He exhibits no edema (of lower extremities).       Assessment:    1. Pre-op examination - POCT urinalysis dipstick - EKG 12-Lead: EKG changes in inferior leads - Ambulatory referral to Cardiology  2. Controlled type 2 diabetes mellitus with complication, without long-term current use of insulin (HCC) - POCT glycosylated hemoglobin (Hb A1C)    Plan:    Will get additional pre-op evaluation from his cardiologist, Dr. Clayborn Bigness. Will fax clinical note and labs to [facility] pending cardiology review.

## 2018-03-09 NOTE — Patient Instructions (Signed)
We will call you about the cardiology referral. 

## 2018-03-09 NOTE — Telephone Encounter (Signed)
Faxed Medical Clearance form to [facility].  Thanks CC

## 2018-03-22 ENCOUNTER — Other Ambulatory Visit: Payer: Self-pay | Admitting: Specialist

## 2018-04-03 ENCOUNTER — Other Ambulatory Visit: Payer: Self-pay

## 2018-04-03 ENCOUNTER — Encounter
Admission: RE | Admit: 2018-04-03 | Discharge: 2018-04-03 | Disposition: A | Discharge status: Home / Self Care | Payer: Medicare Other | Source: Ambulatory Visit | Attending: Specialist | Admitting: Specialist

## 2018-04-03 DIAGNOSIS — Z01818 Encounter for other preprocedural examination: Secondary | ICD-10-CM | POA: Insufficient documentation

## 2018-04-03 HISTORY — DX: Unspecified osteoarthritis, unspecified site: M19.90

## 2018-04-03 HISTORY — DX: Cardiac arrhythmia, unspecified: I49.9

## 2018-04-03 HISTORY — DX: Thyrotoxicosis, unspecified without thyrotoxic crisis or storm: E05.90

## 2018-04-03 HISTORY — DX: Sleep apnea, unspecified: G47.30

## 2018-04-03 NOTE — Pre-Procedure Instructions (Signed)
Patient notified to hold gabapentin the day of surgery since there is an order to give it before surgery

## 2018-04-03 NOTE — Patient Instructions (Addendum)
Your procedure is scheduled on: Mon 7/15 Report to Day Surgery. To find out your arrival time please call [phone] between 1PM - 3PM on Fri. 7/12  Remember: Instructions that are not followed completely may result in serious medical risk,  up to and including death, or upon the discretion of your surgeon and anesthesiologist your  surgery may need to be rescheduled.     _X__ 1. Do not eat food after midnight the night before your procedure.                 No gum chewing or hard candies. You may drink clear liquids up to 2 hours                 before you are scheduled to arrive for your surgery- DO not drink clear                 liquids within 2 hours of the start of your surgery.                 Clear Liquids include:  water, apple juice without pulp, clear carbohydrate                 drink such as Clearfast of Gatorade, Black Coffee or Tea (Do not add                 anything to coffee or tea).  __X__2.  On the morning of surgery brush your teeth with toothpaste and water, you                may rinse your mouth with mouthwash if you wish.  Do not swallow any toothpaste of mouthwash.     _X__ 3.  No Alcohol for 24 hours before or after surgery.   ___ 4.  Do Not Smoke or use e-cigarettes For 24 Hours Prior to Your Surgery.                 Do not use any chewable tobacco products for at least 6 hours prior to                 surgery.  ____  5.  Bring all medications with you on the day of surgery if instructed.   _x___  6.  Notify your doctor if there is any change in your medical condition      (cold, fever, infections).     Do not wear jewelry, make-up, hairpins, clips or nail polish. Do not wear lotions, powders, or perfumes. You may wear deodorant. Do not shave 48 hours prior to surgery. Men may shave face and neck. Do not bring valuables to the hospital.    [facility] is not responsible for any belongings or valuables.  Contacts, dentures or  bridgework may not be worn into surgery. Leave your suitcase in the car. After surgery it may be brought to your room. For patients admitted to the hospital, discharge time is determined by your treatment team.   Patients discharged the day of surgery will not be allowed to drive home.   Please read over the following fact sheets that you were given:     _x___ Take these medicines the morning of surgery with A SIP OF WATER:    1. diltiazem (CARDIZEM CD) 120 MG 24 hr capsule  2. gabapentin (NEURONTIN) 100 MG capsule  3. methimazole (TAPAZOLE) 5 MG tablet  4.metoprolol succinate (TOPROL-XL) 100 MG 24 hr tablet  5.omeprazole (PRILOSEC)  40 MG capsule  6.venlafaxine XR (EFFEXOR-XR) 150 MG 24 hr capsule  ____ Fleet Enema (as directed)   __x__ Use CHG Soap as directed  ____ Use inhalers on the day of surgery  ___x_ Stop metformin 2 days prior to surgery last dose on Friday    __x__ Take 1/2 of usual insulin dose the night before surgery. No insulin the morning          of surgery.   ___x_ Stop apixaban (ELIQUIS) 5 MG TABS tablet per cardiologist recommendation.  Call Dr. Donnamarie Rossetti office   ____ Stop Anti-inflammatories on    __x__ Stop supplements until after surgery.  May continue  ____ Bring C-Pap to the hospital.

## 2018-04-06 ENCOUNTER — Encounter: Payer: Self-pay | Admitting: Family Medicine

## 2018-04-10 ENCOUNTER — Ambulatory Visit
Admission: RE | Admit: 2018-04-10 | Discharge: 2018-04-10 | Disposition: A | Discharge status: Home / Self Care | Payer: Medicare Other | Source: Ambulatory Visit | Attending: Specialist | Admitting: Specialist

## 2018-04-10 ENCOUNTER — Encounter
Admission: RE | Disposition: A | Discharge status: Home / Self Care | Payer: Self-pay | Source: Ambulatory Visit | Attending: Specialist

## 2018-04-10 ENCOUNTER — Ambulatory Visit: Payer: Medicare Other | Admitting: Certified Registered Nurse Anesthetist

## 2018-04-10 ENCOUNTER — Encounter: Payer: Self-pay | Admitting: Specialist

## 2018-04-10 DIAGNOSIS — G5601 Carpal tunnel syndrome, right upper limb: Secondary | ICD-10-CM | POA: Insufficient documentation

## 2018-04-10 DIAGNOSIS — Z794 Long term (current) use of insulin: Secondary | ICD-10-CM | POA: Insufficient documentation

## 2018-04-10 DIAGNOSIS — K219 Gastro-esophageal reflux disease without esophagitis: Secondary | ICD-10-CM | POA: Insufficient documentation

## 2018-04-10 DIAGNOSIS — Z87891 Personal history of nicotine dependence: Secondary | ICD-10-CM | POA: Insufficient documentation

## 2018-04-10 DIAGNOSIS — G4730 Sleep apnea, unspecified: Secondary | ICD-10-CM | POA: Diagnosis not present

## 2018-04-10 DIAGNOSIS — I10 Essential (primary) hypertension: Secondary | ICD-10-CM | POA: Diagnosis not present

## 2018-04-10 DIAGNOSIS — F329 Major depressive disorder, single episode, unspecified: Secondary | ICD-10-CM | POA: Insufficient documentation

## 2018-04-10 DIAGNOSIS — E119 Type 2 diabetes mellitus without complications: Secondary | ICD-10-CM | POA: Diagnosis not present

## 2018-04-10 DIAGNOSIS — F419 Anxiety disorder, unspecified: Secondary | ICD-10-CM | POA: Insufficient documentation

## 2018-04-10 DIAGNOSIS — Z79899 Other long term (current) drug therapy: Secondary | ICD-10-CM | POA: Diagnosis not present

## 2018-04-10 HISTORY — PX: CARPAL TUNNEL RELEASE: SHX101

## 2018-04-10 LAB — GLUCOSE, CAPILLARY
Glucose-Capillary: 190 mg/dL — ABNORMAL HIGH (ref 70–99)
Glucose-Capillary: 174 mg/dL — ABNORMAL HIGH (ref 70–99)

## 2018-04-10 SURGERY — CARPAL TUNNEL RELEASE
Anesthesia: General | Site: Hand | Laterality: Right | Wound class: Clean

## 2018-04-10 MED ORDER — CEFAZOLIN SODIUM-DEXTROSE 2-4 GM/100ML-% IV SOLN
INTRAVENOUS | Status: AC
  Filled 2018-04-10: qty 100

## 2018-04-10 MED ORDER — CHLORHEXIDINE GLUCONATE CLOTH 2 % EX PADS: 6.0000 | MEDICATED_PAD | Freq: Once | CUTANEOUS | Status: DC

## 2018-04-10 MED ORDER — MIDAZOLAM HCL 2 MG/2ML IJ SOLN
INTRAMUSCULAR | Status: DC | PRN
  Administered 2018-04-10: 2 mg via INTRAVENOUS

## 2018-04-10 MED ORDER — FENTANYL CITRATE (PF) 100 MCG/2ML IJ SOLN
INTRAMUSCULAR | Status: AC
  Filled 2018-04-10: qty 2

## 2018-04-10 MED ORDER — BUPIVACAINE HCL (PF) 0.5 % IJ SOLN
INTRAMUSCULAR | Status: AC
  Filled 2018-04-10: qty 30

## 2018-04-10 MED ORDER — EPHEDRINE SULFATE 50 MG/ML IJ SOLN
INTRAMUSCULAR | Status: DC | PRN
  Administered 2018-04-10 (×3): 10 mg via INTRAVENOUS

## 2018-04-10 MED ORDER — ONDANSETRON HCL 4 MG/2ML IJ SOLN
INTRAMUSCULAR | Status: DC | PRN
  Administered 2018-04-10: 4 mg via INTRAVENOUS

## 2018-04-10 MED ORDER — MIDAZOLAM HCL 2 MG/2ML IJ SOLN
INTRAMUSCULAR | Status: AC
  Filled 2018-04-10: qty 2

## 2018-04-10 MED ORDER — PROPOFOL 10 MG/ML IV BOLUS
INTRAVENOUS | Status: AC
  Filled 2018-04-10: qty 20

## 2018-04-10 MED ORDER — ACETAMINOPHEN 10 MG/ML IV SOLN
INTRAVENOUS | Status: DC | PRN
  Administered 2018-04-10: 1000 mg via INTRAVENOUS

## 2018-04-10 MED ORDER — ONDANSETRON HCL 4 MG/2ML IJ SOLN: 4.0000 mg | Freq: Once | INTRAMUSCULAR | Status: DC | PRN

## 2018-04-10 MED ORDER — DEXAMETHASONE SODIUM PHOSPHATE 10 MG/ML IJ SOLN
INTRAMUSCULAR | Status: DC | PRN
  Administered 2018-04-10: 5 mg via INTRAVENOUS

## 2018-04-10 MED ORDER — LIDOCAINE HCL (PF) 2 % IJ SOLN
INTRAMUSCULAR | Status: AC
  Filled 2018-04-10: qty 10

## 2018-04-10 MED ORDER — HYDROCODONE-ACETAMINOPHEN 5-325 MG PO TABS: 1.0000 | ORAL_TABLET | Freq: Four times a day (QID) | ORAL | 0 refills | Status: DC | PRN

## 2018-04-10 MED ORDER — FENTANYL CITRATE (PF) 100 MCG/2ML IJ SOLN
INTRAMUSCULAR | Status: DC | PRN
  Administered 2018-04-10: 50 ug via INTRAVENOUS

## 2018-04-10 MED ORDER — DEXAMETHASONE SODIUM PHOSPHATE 10 MG/ML IJ SOLN
INTRAMUSCULAR | Status: AC
  Filled 2018-04-10: qty 1

## 2018-04-10 MED ORDER — ONDANSETRON HCL 4 MG/2ML IJ SOLN
INTRAMUSCULAR | Status: AC
  Filled 2018-04-10: qty 2

## 2018-04-10 MED ORDER — GABAPENTIN 400 MG PO CAPS: 400.0000 mg | ORAL_CAPSULE | Freq: Two times a day (BID) | ORAL | 3 refills | Status: DC

## 2018-04-10 MED ORDER — ACETAMINOPHEN 10 MG/ML IV SOLN
INTRAVENOUS | Status: AC
  Filled 2018-04-10: qty 100

## 2018-04-10 MED ORDER — CEFAZOLIN SODIUM-DEXTROSE 2-4 GM/100ML-% IV SOLN
2.0000 g | INTRAVENOUS | Status: AC
  Administered 2018-04-10: 2 g via INTRAVENOUS

## 2018-04-10 MED ORDER — MELOXICAM 7.5 MG PO TABS
ORAL_TABLET | ORAL | Status: AC
  Administered 2018-04-10: 15 mg via ORAL
  Filled 2018-04-10: qty 2

## 2018-04-10 MED ORDER — GABAPENTIN 300 MG PO CAPS
300.0000 mg | ORAL_CAPSULE | ORAL | Status: AC
  Administered 2018-04-10: 300 mg via ORAL

## 2018-04-10 MED ORDER — CLINDAMYCIN PHOSPHATE 600 MG/50ML IV SOLN
600.0000 mg | INTRAVENOUS | Status: AC
  Administered 2018-04-10: 600 mg via INTRAVENOUS

## 2018-04-10 MED ORDER — BUPIVACAINE HCL (PF) 0.5 % IJ SOLN
INTRAMUSCULAR | Status: DC | PRN
  Administered 2018-04-10: 12 mL

## 2018-04-10 MED ORDER — PROPOFOL 10 MG/ML IV BOLUS
INTRAVENOUS | Status: DC | PRN
  Administered 2018-04-10: 150 mg via INTRAVENOUS

## 2018-04-10 MED ORDER — PHENYLEPHRINE HCL 10 MG/ML IJ SOLN
INTRAMUSCULAR | Status: DC | PRN
  Administered 2018-04-10 (×3): 200 ug via INTRAVENOUS

## 2018-04-10 MED ORDER — FENTANYL CITRATE (PF) 100 MCG/2ML IJ SOLN: 25.0000 ug | INTRAMUSCULAR | Status: DC | PRN

## 2018-04-10 MED ORDER — MELOXICAM 7.5 MG PO TABS
15.0000 mg | ORAL_TABLET | ORAL | Status: AC
  Administered 2018-04-10: 15 mg via ORAL

## 2018-04-10 MED ORDER — LIDOCAINE HCL (CARDIAC) PF 100 MG/5ML IV SOSY
PREFILLED_SYRINGE | INTRAVENOUS | Status: DC | PRN
  Administered 2018-04-10: 80 mg via INTRAVENOUS

## 2018-04-10 MED ORDER — CLINDAMYCIN PHOSPHATE 600 MG/50ML IV SOLN
INTRAVENOUS | Status: AC
  Filled 2018-04-10: qty 50

## 2018-04-10 MED ORDER — SODIUM CHLORIDE 0.9 % IV SOLN
INTRAVENOUS | Status: DC
  Administered 2018-04-10: 11:00:00 via INTRAVENOUS

## 2018-04-10 MED ORDER — GABAPENTIN 300 MG PO CAPS
ORAL_CAPSULE | ORAL | Status: AC
  Administered 2018-04-10: 300 mg via ORAL
  Filled 2018-04-10: qty 1

## 2018-04-10 MED ORDER — MELOXICAM 15 MG PO TABS: 15.0000 mg | ORAL_TABLET | Freq: Every day | ORAL | 3 refills | Status: DC

## 2018-04-10 SURGICAL SUPPLY — 27 items
BLADE SURG MINI STRL (BLADE) ×2 IMPLANT
BNDG ESMARK 4X12 TAN STRL LF (GAUZE/BANDAGES/DRESSINGS) ×2 IMPLANT
CANISTER SUCT 1200ML W/VALVE (MISCELLANEOUS) ×2 IMPLANT
CHLORAPREP W/TINT 26ML (MISCELLANEOUS) ×2 IMPLANT
CUFF TOURN 18 STER (MISCELLANEOUS) ×1 IMPLANT
DRSG GAUZE FLUFF 36X18 (GAUZE/BANDAGES/DRESSINGS) ×2 IMPLANT
ELECT REM PT RETURN 9FT ADLT (ELECTROSURGICAL) ×2
ELECTRODE REM PT RTRN 9FT ADLT (ELECTROSURGICAL) ×1 IMPLANT
GAUZE PETRO XEROFOAM 1X8 (MISCELLANEOUS) ×2 IMPLANT
GLOVE BIO SURGEON STRL SZ8 (GLOVE) ×2 IMPLANT
GOWN STRL REUS W/ TWL LRG LVL3 (GOWN DISPOSABLE) ×1 IMPLANT
GOWN STRL REUS W/TWL LRG LVL3 (GOWN DISPOSABLE) ×2
GOWN STRL REUS W/TWL LRG LVL4 (GOWN DISPOSABLE) ×2 IMPLANT
KIT TURNOVER KIT A (KITS) ×2 IMPLANT
NS IRRIG 500ML POUR BTL (IV SOLUTION) ×2 IMPLANT
PACK EXTREMITY ARMC (MISCELLANEOUS) ×2 IMPLANT
PAD PREP 24X41 OB/GYN DISP (PERSONAL CARE ITEMS) ×2 IMPLANT
PADDING CAST 4IN STRL (MISCELLANEOUS) ×1
PADDING CAST BLEND 4X4 STRL (MISCELLANEOUS) ×1 IMPLANT
SPLINT CAST 1 STEP 3X12 (MISCELLANEOUS) ×2 IMPLANT
STOCKINETTE 48X4 2 PLY STRL (GAUZE/BANDAGES/DRESSINGS) ×1 IMPLANT
STOCKINETTE BIAS CUT 4 980044 (GAUZE/BANDAGES/DRESSINGS) ×2 IMPLANT
STOCKINETTE STRL 4IN 9604848 (GAUZE/BANDAGES/DRESSINGS) ×2 IMPLANT
SUT ETHILON 4-0 (SUTURE) ×2
SUT ETHILON 4-0 FS2 18XMFL BLK (SUTURE) ×1
SUT ETHILON 5-0 FS-2 18 BLK (SUTURE) ×2 IMPLANT
SUTURE ETHLN 4-0 FS2 18XMF BLK (SUTURE) ×1 IMPLANT

## 2018-04-10 NOTE — H&P (Signed)
THE PATIENT WAS SEEN PRIOR TO SURGERY TODAY.  HISTORY, ALLERGIES, HOME MEDICATIONS AND OPERATIVE PROCEDURE WERE REVIEWED. RISKS AND BENEFITS OF SURGERY DISCUSSED WITH PATIENT AGAIN.  NO CHANGES FROM INITIAL HISTORY AND PHYSICAL NOTED.    

## 2018-04-10 NOTE — Op Note (Signed)
04/10/2018  11:47 AM  PATIENT:  Tom Moore    PRE-OPERATIVE DIAGNOSIS: RIGHT CARPAL TUNNEL SYNDROME POST-OPERATIVE DIAGNOSIS: RIGHT CARPAL TUNNEL SYNDROME  PROCEDURE:  RIGHT CARPAL TUNNEL RELEASE  SURGEON: Park Breed, MD    ANESTHESIA:   General  TOURNIQUET TIME: 81   MIN  PREOPERATIVE INDICATIONS:  Tom Moore is a  72 y.o. male with a diagnosis of right carpal tunnel syndrome who failed conservative measures and elected for surgical management.    The risks benefits and alternatives were discussed with the patient preoperatively including but not limited to the risks of infection, bleeding, nerve injury, incomplete relief of symptoms, pillar pain, cardiopulmonary complications, the need for revision surgery, among others, and the patient was willing to proceed.  OPERATIVE FINDINGS: Thickened volar ligament and nerve compression.  OPERATIVE PROCEDURE: The patient is brought to the operating room placed in the supine position. General anesthesia was administered. The right upper extremity was prepped and draped in usual sterile fashion. Time out was performed. The arm was elevated and exsanguinated and the tourniquet was inflated. Incision was made in line with the radial border of the ring finger. The carpal tunnel transverse fascia was identified, cleaned, and incised sharply. The common sensory branches were visualized along with the superficial palmar arch and protected.  The median nerve was protected below  A Kelly clamp was  placed underneath the transverse carpal ligament, protecting the nerve. I released the ligament completely, and then released the proximal distal volar forearm fascia. The nerve was identified, and visualized, and protected throughout the case. The motor branch was intact upon inspection.  No masses or abnormalities were identified in ulnar bursa.  The wounds were irrigated copiously, and the skin closed with nylon. The wound was injected with 1/2%  marcaine followed by a sterile dressing and  volar splint .  Tourniquet was deflated with good return of blood flow to all fingers. Sponge and needle counts were correct.  The patient tolerated this well, with no complications. The patient was awakened and taken to recovery in good condition.

## 2018-04-10 NOTE — Discharge Instructions (Signed)

## 2018-04-10 NOTE — OR Nursing (Signed)
Discharge instructions discussed with pt and daughter. Both voice understanding. 

## 2018-04-10 NOTE — Anesthesia Post-op Follow-up Note (Signed)
Anesthesia QCD[facility] form completed.        

## 2018-04-10 NOTE — Anesthesia Procedure Notes (Signed)
Procedure Name: LMA Insertion Date/Time: 04/10/2018 11:01 AM Performed by: Johnna Acosta, CRNA Pre-anesthesia Checklist: Patient identified, Emergency Drugs available, Suction available, Patient being monitored and Timeout performed Patient Re-evaluated:Patient Re-evaluated prior to induction Oxygen Delivery Method: Circle system utilized Preoxygenation: Pre-oxygenation with 100% oxygen Induction Type: IV induction LMA: LMA inserted LMA Size: 4.0 Tube type: Oral Tube size: 4.0 mm Number of attempts: 1 Placement Confirmation: positive ETCO2 and breath sounds checked- equal and bilateral Tube secured with: Tape Dental Injury: Teeth and Oropharynx as per pre-operative assessment

## 2018-04-10 NOTE — Transfer of Care (Signed)
Immediate Anesthesia Transfer of Care Note  Patient: Tom Moore  Procedure(s) Performed: RELEASE MEDIAN NERVE AT CARPAL TUNNEL (Right Hand)  Patient Location: PACU  Anesthesia Type:General  Level of Consciousness: sedated  Airway & Oxygen Therapy: Patient Spontanous Breathing and Patient connected to face mask oxygen  Post-op Assessment: Report given to RN and Post -op Vital signs reviewed and stable  Post vital signs: Reviewed and stable  Last Vitals:  Vitals Value Taken Time  BP 120/70 04/10/2018 11:56 AM  Temp 36.1 C 04/10/2018 11:56 AM  Pulse 53 04/10/2018 12:00 PM  Resp 16 04/10/2018 12:00 PM  SpO2 99 % 04/10/2018 12:00 PM  Vitals shown include unvalidated device data.  Last Pain:  Vitals:   04/10/18 1156  TempSrc:   PainSc: Asleep         Complications: No apparent anesthesia complications

## 2018-04-10 NOTE — Anesthesia Preprocedure Evaluation (Signed)
Anesthesia Evaluation  Patient identified by MRN, date of birth, ID band Patient awake    Reviewed: Allergy & Precautions, H&P , NPO status , Patient's Chart, lab work & pertinent test results, reviewed documented beta blocker date and time   Airway Mallampati: III  TM Distance: >3 FB Neck ROM: full    Dental  (+) Teeth Intact   Pulmonary neg pulmonary ROS, sleep apnea and Continuous Positive Airway Pressure Ventilation , former smoker,    Pulmonary exam normal        Cardiovascular Exercise Tolerance: Good hypertension, On Medications negative cardio ROS Normal cardiovascular exam+ dysrhythmias  Rate:Normal     Neuro/Psych PSYCHIATRIC DISORDERS Anxiety Depression  Neuromuscular disease negative neurological ROS  negative psych ROS   GI/Hepatic negative GI ROS, Neg liver ROS, GERD  ,  Endo/Other  negative endocrine ROSdiabetesHyperthyroidism   Renal/GU Renal diseasenegative Renal ROS  negative genitourinary   Musculoskeletal   Abdominal   Peds  Hematology negative hematology ROS (+)   Anesthesia Other Findings   Reproductive/Obstetrics negative OB ROS                             Anesthesia Physical Anesthesia Plan  ASA: III  Anesthesia Plan: General LMA   Post-op Pain Management:    Induction:   PONV Risk Score and Plan: 3  Airway Management Planned:   Additional Equipment:   Intra-op Plan:   Post-operative Plan:   Informed Consent: I have reviewed the patients History and Physical, chart, labs and discussed the procedure including the risks, benefits and alternatives for the proposed anesthesia with the patient or authorized representative who has indicated his/her understanding and acceptance.     Plan Discussed with: CRNA  Anesthesia Plan Comments:         Anesthesia Quick Evaluation

## 2018-04-11 NOTE — Anesthesia Postprocedure Evaluation (Signed)
Anesthesia Post Note  Patient: Tom Moore  Procedure(s) Performed: RELEASE MEDIAN NERVE AT CARPAL TUNNEL (Right Hand)  Patient location during evaluation: PACU Anesthesia Type: General Level of consciousness: awake and alert Pain management: pain level controlled Vital Signs Assessment: post-procedure vital signs reviewed and stable Respiratory status: spontaneous breathing, nonlabored ventilation, respiratory function stable and patient connected to nasal cannula oxygen Cardiovascular status: blood pressure returned to baseline and stable Postop Assessment: no apparent nausea or vomiting Anesthetic complications: no     Last Vitals:  Vitals:   04/10/18 1253 04/10/18 1256  BP: 111/76 111/60  Pulse: (!) 51 (!) 59  Resp: 18 16  Temp: 36.4 C   SpO2: 99% 97%    Last Pain:  Vitals:   04/10/18 1253  TempSrc:   PainSc: 0-No pain                 Molli Barrows

## 2018-04-12 ENCOUNTER — Ambulatory Visit: Payer: Medicare Other | Admitting: Urology

## 2018-04-26 ENCOUNTER — Other Ambulatory Visit: Payer: Self-pay | Admitting: Specialist

## 2018-05-01 ENCOUNTER — Other Ambulatory Visit: Payer: Self-pay

## 2018-05-01 ENCOUNTER — Encounter
Admission: RE | Admit: 2018-05-01 | Discharge: 2018-05-01 | Disposition: A | Discharge status: Home / Self Care | Payer: Medicare Other | Source: Ambulatory Visit | Attending: Specialist | Admitting: Specialist

## 2018-05-01 DIAGNOSIS — Z01812 Encounter for preprocedural laboratory examination: Secondary | ICD-10-CM | POA: Insufficient documentation

## 2018-05-01 LAB — BASIC METABOLIC PANEL
ANION GAP: 14 (ref 5–15)
BUN: 27 mg/dL — ABNORMAL HIGH (ref 8–23)
CALCIUM: 9.3 mg/dL (ref 8.9–10.3)
CO2: 24 mmol/L (ref 22–32)
Chloride: 100 mmol/L (ref 98–111)
Creatinine, Ser: 1.61 mg/dL — ABNORMAL HIGH (ref 0.61–1.24)
GFR calc Af Amer: 48 mL/min — ABNORMAL LOW (ref >60)
GFR calc non Af Amer: 41 mL/min — ABNORMAL LOW (ref >60)
Glucose, Bld: 224 mg/dL — ABNORMAL HIGH (ref 70–99)
Potassium: 4.7 mmol/L (ref 3.5–5.1)
Sodium: 138 mmol/L (ref 135–145)

## 2018-05-01 NOTE — Patient Instructions (Addendum)
Your procedure is scheduled on: Mon. 8/12 Report to Day Surgery. To find out your arrival time please call [phone] between 1PM - 3PM on Friday.8/9  Remember: Instructions that are not followed completely may result in serious medical risk,  up to and including death, or upon the discretion of your surgeon and anesthesiologist your  surgery may need to be rescheduled.     _X__ 1. Do not eat food after midnight the night before your procedure.                 No gum chewing or hard candies. You may drink clear liquids up to 2 hours                 before you are scheduled to arrive for your surgery- DO not drink clear                 liquids within 2 hours of the start of your surgery.                 Clear Liquids include:  water, apple juice without pulp, clear carbohydrate                 drink such as Clearfast of Gatorade, Black Coffee or Tea (Do not add                 anything to coffee or tea).  __X__2.  On the morning of surgery brush your teeth with toothpaste and water, you                may rinse your mouth with mouthwash if you wish.  Do not swallow any toothpaste of mouthwash.     _X__ 3.  No Alcohol for 24 hours before or after surgery.   _X__ 4.  Do Not Smoke or use e-cigarettes For 24 Hours Prior to Your Surgery.                 Do not use any chewable tobacco products for at least 6 hours prior to                 surgery.  ____  5.  Bring all medications with you on the day of surgery if instructed.   __x__  6.  Notify your doctor if there is any change in your medical condition      (cold, fever, infections).     Do not wear jewelry, make-up, hairpins, clips or nail polish. Do not wear lotions, powders, or perfumes. You may wear deodorant. Do not shave 48 hours prior to surgery. Men may shave face and neck. Do not bring valuables to the hospital.    [facility] is not responsible for any belongings or valuables.  Contacts, dentures  or bridgework may not be worn into surgery. Leave your suitcase in the car. After surgery it may be brought to your room. For patients admitted to the hospital, discharge time is determined by your treatment team.   Patients discharged the day of surgery will not be allowed to drive home.   Please read over the following fact sheets that you were given:    __x__ Take these medicines the morning of surgery with A SIP OF WATER:    1. diltiazem (CARDIZEM CD) 120 MG 24 hr capsule    3. metoprolol succinate (TOPROL-XL) 100 MG 24 hr tablet  4.omeprazole (PRILOSEC) 40 MG capsule take extra dose the night before and dose the day  of surgery  5.venlafaxine XR (EFFEXOR-XR) 150 MG 24 hr capsule  6.  ____ Fleet Enema (as directed)   _x___ Use CHG Soap as directed  ____ Use inhalers on the day of surgery  _x___ Stop metformin 2 days prior to surgery Last dose on Friday 8/9    __x__ Take 1/2 of usual insulin dose the night before surgery. No insulin the morning          of surgery.   __x__ Stop   apixaban (ELIQUIS) 5 MG TABS tablet last dose tomorrow start back 3 days after  _x___ Stop Anti-inflammatories   May take tylenol   ____ Stop supplements until after surgery.    ____ Bring C-Pap to the hospital.

## 2018-05-01 NOTE — Pre-Procedure Instructions (Signed)
MET B FAXED TO DR Ammie Ferrier

## 2018-05-07 MED ORDER — CLINDAMYCIN PHOSPHATE 600 MG/50ML IV SOLN
600.0000 mg | INTRAVENOUS | Status: AC
  Administered 2018-05-08: 600 mg via INTRAVENOUS

## 2018-05-07 MED ORDER — CEFAZOLIN SODIUM-DEXTROSE 2-4 GM/100ML-% IV SOLN
2.0000 g | INTRAVENOUS | Status: AC
  Administered 2018-05-08: 2 g via INTRAVENOUS

## 2018-05-08 ENCOUNTER — Ambulatory Visit
Admission: RE | Admit: 2018-05-08 | Discharge: 2018-05-08 | Disposition: A | Discharge status: Home / Self Care | Payer: Medicare Other | Source: Ambulatory Visit | Attending: Specialist | Admitting: Specialist

## 2018-05-08 ENCOUNTER — Encounter: Payer: Self-pay | Admitting: *Deleted

## 2018-05-08 ENCOUNTER — Encounter
Admission: RE | Disposition: A | Discharge status: Home / Self Care | Payer: Self-pay | Source: Ambulatory Visit | Attending: Specialist

## 2018-05-08 ENCOUNTER — Ambulatory Visit: Payer: Medicare Other | Admitting: Anesthesiology

## 2018-05-08 ENCOUNTER — Other Ambulatory Visit: Payer: Self-pay

## 2018-05-08 DIAGNOSIS — G5602 Carpal tunnel syndrome, left upper limb: Secondary | ICD-10-CM | POA: Insufficient documentation

## 2018-05-08 DIAGNOSIS — Z87891 Personal history of nicotine dependence: Secondary | ICD-10-CM | POA: Diagnosis not present

## 2018-05-08 DIAGNOSIS — K219 Gastro-esophageal reflux disease without esophagitis: Secondary | ICD-10-CM | POA: Diagnosis not present

## 2018-05-08 DIAGNOSIS — F329 Major depressive disorder, single episode, unspecified: Secondary | ICD-10-CM | POA: Insufficient documentation

## 2018-05-08 DIAGNOSIS — Z79899 Other long term (current) drug therapy: Secondary | ICD-10-CM | POA: Insufficient documentation

## 2018-05-08 DIAGNOSIS — G4730 Sleep apnea, unspecified: Secondary | ICD-10-CM | POA: Insufficient documentation

## 2018-05-08 DIAGNOSIS — I10 Essential (primary) hypertension: Secondary | ICD-10-CM | POA: Diagnosis not present

## 2018-05-08 HISTORY — PX: CARPAL TUNNEL RELEASE: SHX101

## 2018-05-08 LAB — GLUCOSE, CAPILLARY
Glucose-Capillary: 114 mg/dL — ABNORMAL HIGH (ref 70–99)
Glucose-Capillary: 131 mg/dL — ABNORMAL HIGH (ref 70–99)

## 2018-05-08 SURGERY — CARPAL TUNNEL RELEASE
Anesthesia: General | Site: Arm Lower | Laterality: Left | Wound class: Clean

## 2018-05-08 MED ORDER — BUPIVACAINE HCL (PF) 0.5 % IJ SOLN
INTRAMUSCULAR | Status: AC
  Filled 2018-05-08: qty 30

## 2018-05-08 MED ORDER — ONDANSETRON HCL 4 MG/2ML IJ SOLN
INTRAMUSCULAR | Status: DC | PRN
  Administered 2018-05-08: 4 mg via INTRAVENOUS

## 2018-05-08 MED ORDER — SEVOFLURANE IN SOLN
RESPIRATORY_TRACT | Status: AC
  Filled 2018-05-08: qty 250

## 2018-05-08 MED ORDER — PROPOFOL 10 MG/ML IV BOLUS
INTRAVENOUS | Status: AC
  Filled 2018-05-08: qty 20

## 2018-05-08 MED ORDER — ONDANSETRON HCL 4 MG/2ML IJ SOLN: 4.0000 mg | Freq: Once | INTRAMUSCULAR | Status: DC | PRN

## 2018-05-08 MED ORDER — LIDOCAINE HCL (CARDIAC) PF 100 MG/5ML IV SOSY
PREFILLED_SYRINGE | INTRAVENOUS | Status: DC | PRN
  Administered 2018-05-08: 80 mg via INTRAVENOUS

## 2018-05-08 MED ORDER — PROPOFOL 10 MG/ML IV BOLUS
INTRAVENOUS | Status: DC | PRN
  Administered 2018-05-08: 150 mg via INTRAVENOUS
  Administered 2018-05-08: 50 mg via INTRAVENOUS

## 2018-05-08 MED ORDER — FENTANYL CITRATE (PF) 100 MCG/2ML IJ SOLN
INTRAMUSCULAR | Status: AC
  Filled 2018-05-08: qty 2

## 2018-05-08 MED ORDER — CHLORHEXIDINE GLUCONATE CLOTH 2 % EX PADS: 6.0000 | MEDICATED_PAD | Freq: Once | CUTANEOUS | Status: DC

## 2018-05-08 MED ORDER — FENTANYL CITRATE (PF) 100 MCG/2ML IJ SOLN
INTRAMUSCULAR | Status: DC | PRN
  Administered 2018-05-08 (×2): 50 ug via INTRAVENOUS

## 2018-05-08 MED ORDER — CEFAZOLIN SODIUM-DEXTROSE 2-4 GM/100ML-% IV SOLN
INTRAVENOUS | Status: AC
  Filled 2018-05-08: qty 100

## 2018-05-08 MED ORDER — FENTANYL CITRATE (PF) 100 MCG/2ML IJ SOLN: 25.0000 ug | INTRAMUSCULAR | Status: DC | PRN

## 2018-05-08 MED ORDER — CLINDAMYCIN PHOSPHATE 600 MG/50ML IV SOLN
INTRAVENOUS | Status: AC
  Filled 2018-05-08: qty 50

## 2018-05-08 MED ORDER — EPHEDRINE SULFATE 50 MG/ML IJ SOLN
INTRAMUSCULAR | Status: DC | PRN
  Administered 2018-05-08 (×4): 5 mg via INTRAVENOUS

## 2018-05-08 MED ORDER — BUPIVACAINE HCL 0.5 % IJ SOLN
INTRAMUSCULAR | Status: DC | PRN
  Administered 2018-05-08: 16 mL

## 2018-05-08 MED ORDER — HYDROCODONE-ACETAMINOPHEN 5-325 MG PO TABS: 1.0000 | ORAL_TABLET | Freq: Four times a day (QID) | ORAL | 0 refills | Status: DC | PRN

## 2018-05-08 MED ORDER — SODIUM CHLORIDE 0.9 % IV SOLN
INTRAVENOUS | Status: DC
  Administered 2018-05-08: 09:00:00 via INTRAVENOUS

## 2018-05-08 MED ORDER — GABAPENTIN 300 MG PO CAPS
300.0000 mg | ORAL_CAPSULE | ORAL | Status: AC
  Administered 2018-05-08: 300 mg via ORAL

## 2018-05-08 MED ORDER — GABAPENTIN 300 MG PO CAPS
ORAL_CAPSULE | ORAL | Status: AC
Start: 2018-05-08 — End: 2018-05-08
  Administered 2018-05-08: 300 mg via ORAL
  Filled 2018-05-08: qty 1

## 2018-05-08 SURGICAL SUPPLY — 27 items
BLADE SURG MINI STRL (BLADE) ×2 IMPLANT
BNDG ESMARK 4X12 TAN STRL LF (GAUZE/BANDAGES/DRESSINGS) ×2 IMPLANT
CANISTER SUCT 1200ML W/VALVE (MISCELLANEOUS) ×2 IMPLANT
CHLORAPREP W/TINT 26ML (MISCELLANEOUS) ×2 IMPLANT
CUFF TOURN 18 STER (MISCELLANEOUS) ×1 IMPLANT
DRSG GAUZE FLUFF 36X18 (GAUZE/BANDAGES/DRESSINGS) ×3 IMPLANT
ELECT REM PT RETURN 9FT ADLT (ELECTROSURGICAL) ×2
ELECTRODE REM PT RTRN 9FT ADLT (ELECTROSURGICAL) ×1 IMPLANT
GAUZE PETRO XEROFOAM 1X8 (MISCELLANEOUS) ×2 IMPLANT
GLOVE BIO SURGEON STRL SZ8 (GLOVE) ×2 IMPLANT
GOWN STRL REUS W/ TWL LRG LVL3 (GOWN DISPOSABLE) ×1 IMPLANT
GOWN STRL REUS W/TWL LRG LVL3 (GOWN DISPOSABLE) ×2
GOWN STRL REUS W/TWL LRG LVL4 (GOWN DISPOSABLE) ×2 IMPLANT
KIT TURNOVER KIT A (KITS) ×2 IMPLANT
NS IRRIG 500ML POUR BTL (IV SOLUTION) ×2 IMPLANT
PACK EXTREMITY ARMC (MISCELLANEOUS) ×2 IMPLANT
PAD PREP 24X41 OB/GYN DISP (PERSONAL CARE ITEMS) ×2 IMPLANT
PADDING CAST 4IN STRL (MISCELLANEOUS) ×1
PADDING CAST BLEND 4X4 STRL (MISCELLANEOUS) ×1 IMPLANT
SPLINT CAST 1 STEP 3X12 (MISCELLANEOUS) ×2 IMPLANT
STOCKINETTE 48X4 2 PLY STRL (GAUZE/BANDAGES/DRESSINGS) ×1 IMPLANT
STOCKINETTE BIAS CUT 4 980044 (GAUZE/BANDAGES/DRESSINGS) ×2 IMPLANT
STOCKINETTE STRL 4IN 9604848 (GAUZE/BANDAGES/DRESSINGS) ×2 IMPLANT
SUT ETHILON 4-0 (SUTURE)
SUT ETHILON 4-0 FS2 18XMFL BLK (SUTURE)
SUT ETHILON 5-0 FS-2 18 BLK (SUTURE) ×2 IMPLANT
SUTURE ETHLN 4-0 FS2 18XMF BLK (SUTURE) ×1 IMPLANT

## 2018-05-08 NOTE — Anesthesia Post-op Follow-up Note (Signed)
Anesthesia QCD[facility] form completed.        

## 2018-05-08 NOTE — Discharge Instructions (Signed)
AMBULATORY SURGERY  DISCHARGE INSTRUCTIONS   1) The drugs that you were given will stay in your system until tomorrow so for the next 24 hours you should not:  A) Drive an automobile B) Make any legal decisions C) Drink any alcoholic beverage   2) You may resume regular meals tomorrow.  Today it is better to start with liquids and gradually work up to solid foods.  You may eat anything you prefer, but it is better to start with liquids, then soup and crackers, and gradually work up to solid foods.   3) Please notify your doctor immediately if you have any unusual bleeding, trouble breathing, redness and pain at the surgery site, drainage, fever, or pain not relieved by medication.    4) Additional Instructions: Keep post op appt for Weds the 13 th at 87, May restart ELoqusit tommorow.       Please contact your physician with any problems or Same Day Surgery at [phone], Monday through Friday 6 am to 4 pm, or [place] at [facility] number at [phone].

## 2018-05-08 NOTE — H&P (Signed)
THE PATIENT WAS SEEN PRIOR TO SURGERY TODAY.  HISTORY, ALLERGIES, HOME MEDICATIONS AND OPERATIVE PROCEDURE WERE REVIEWED. RISKS AND BENEFITS OF SURGERY DISCUSSED WITH PATIENT AGAIN.  NO CHANGES FROM INITIAL HISTORY AND PHYSICAL NOTED.    

## 2018-05-08 NOTE — Progress Notes (Signed)
Circulation positive to left hand   Can wiggle fingers

## 2018-05-08 NOTE — Op Note (Signed)
05/08/2018  10:54 AM  PATIENT:  Tom Moore    PRE-OPERATIVE DIAGNOSIS: LEFT CARPAL TUNNEL SYNDROME  POST-OPERATIVE DIAGNOSIS: LEFT CARPAL TUNNEL SYNDROME  PROCEDURE:  LEFT CARPAL TUNNEL RELEASE  SURGEON: Park Breed, MD  TOURNIQUET TIME: 30  MIN   ANESTHESIA:   General  PREOPERATIVE INDICATIONS:  IZICK GASBARRO is a  72 y.o. male with a diagnosis of left carpal tunnel syndrome who failed conservative measures and elected for surgical management.    The risks benefits and alternatives were discussed with the patient preoperatively including but not limited to the risks of infection, bleeding, nerve injury, incomplete relief of symptoms, pillar pain, cardiopulmonary complications, the need for revision surgery, among others, and the patient was willing to proceed.  OPERATIVE FINDINGS: Thickened volar ligament and nerve compression.  OPERATIVE PROCEDURE: The patient is brought to the operating room placed in the supine position. General anesthesia was administered. The left upper extremity was prepped and draped in usual sterile fashion. Time out was performed. The arm was elevated and exsanguinated and the tourniquet was inflated. Incision was made in line with the radial border of the ring finger. The carpal tunnel transverse fascia was identified, cleaned, and incised sharply. The common sensory branches were visualized along with the superficial palmar arch and protected.  The median nerve was protected below. A Kelly clamp was  placed underneath the transverse carpal ligament, protecting the nerve. I released the ligament completely, and then released the proximal distal volar forearm fascia. The nerve was identified, and visualized, and protected throughout the case. The motor branch was intact upon inspection. No masses or abnormalities were identified in the ulnar bursa.  The wounds were irrigated copiously and the skin closed with nylon. The wound was injected with 1/2 %  marcaine followed by a sterile dressing and volar splint. Tourniquet was deflated with good return of blood flow to all fingers. Sponge and needle counts were correct.  The patient tolerated this well, with no complications. The patient was awakened and taken to recovery in good condition.

## 2018-05-08 NOTE — Transfer of Care (Signed)
Immediate Anesthesia Transfer of Care Note  Patient: Tom Moore  Procedure(s) Performed: CARPAL TUNNEL RELEASE (Left Arm Lower)  Patient Location: PACU  Anesthesia Type:General  Level of Consciousness: awake  Airway & Oxygen Therapy: Patient connected to nasal cannula oxygen  Post-op Assessment: Report given to RN  Post vital signs: stable  Last Vitals:  Vitals Value Taken Time  BP 123/84 05/08/2018 11:02 AM  Temp    Pulse 71 05/08/2018 11:04 AM  Resp 26 05/08/2018 11:04 AM  SpO2 96 % 05/08/2018 11:04 AM  Vitals shown include unvalidated device data.  Last Pain:  Vitals:   05/08/18 1100  TempSrc:   PainSc: (P) 0-No pain         Complications: No apparent anesthesia complications

## 2018-05-08 NOTE — Anesthesia Preprocedure Evaluation (Signed)
Anesthesia Evaluation  Patient identified by MRN, date of birth, ID band Patient awake    Reviewed: Allergy & Precautions, H&P , NPO status , Patient's Chart, lab work & pertinent test results, reviewed documented beta blocker date and time   History of Anesthesia Complications Negative for: history of anesthetic complications  Airway Mallampati: III  TM Distance: >3 FB Neck ROM: full    Dental  (+) Dental Advidsory Given, Caps, Edentulous Upper, Poor Dentition   Pulmonary neg pulmonary ROS, neg shortness of breath, sleep apnea and Continuous Positive Airway Pressure Ventilation , neg recent URI, former smoker,    Pulmonary exam normal        Cardiovascular Exercise Tolerance: Good hypertension, On Medications (-) angina(-) CAD, (-) Past MI, (-) Cardiac Stents and (-) CABG negative cardio ROS Normal cardiovascular exam+ dysrhythmias Atrial Fibrillation (-) Valvular Problems/Murmurs Rate:Normal     Neuro/Psych PSYCHIATRIC DISORDERS Anxiety Depression  Neuromuscular disease negative neurological ROS  negative psych ROS   GI/Hepatic negative GI ROS, Neg liver ROS, GERD  ,  Endo/Other  diabetesHyperthyroidism   Renal/GU Renal disease  negative genitourinary   Musculoskeletal   Abdominal   Peds  Hematology negative hematology ROS (+)   Anesthesia Other Findings Past Medical History: No date: Anxiety No date: Arthritis No date: Depression No date: Diabetes mellitus without complication (HCC) No date: Dysrhythmia     Comment:  atrial fib No date: GERD (gastroesophageal reflux disease) No date: Hyperlipidemia No date: Hypertension No date: Hyperthyroidism No date: Insomnia No date: Sleep apnea     Comment:  unable to tolerater CPAP   Reproductive/Obstetrics negative OB ROS                             Anesthesia Physical  Anesthesia Plan  ASA: III  Anesthesia Plan: General    Post-op Pain Management:    Induction: Intravenous  PONV Risk Score and Plan: 3 and Ondansetron and Dexamethasone  Airway Management Planned: LMA  Additional Equipment:   Intra-op Plan:   Post-operative Plan: Extubation in OR  Informed Consent: I have reviewed the patients History and Physical, chart, labs and discussed the procedure including the risks, benefits and alternatives for the proposed anesthesia with the patient or authorized representative who has indicated his/her understanding and acceptance.   Dental Advisory Given  Plan Discussed with: CRNA  Anesthesia Plan Comments:         Anesthesia Quick Evaluation

## 2018-05-08 NOTE — Anesthesia Procedure Notes (Signed)
Procedure Name: LMA Insertion Date/Time: 05/08/2018 9:54 AM Performed by: Zetta Bills, CRNA Pre-anesthesia Checklist: Patient identified, Emergency Drugs available, Suction available and Patient being monitored Patient Re-evaluated:Patient Re-evaluated prior to induction Oxygen Delivery Method: Circle system utilized Preoxygenation: Pre-oxygenation with 100% oxygen Induction Type: IV induction Ventilation: Mask ventilation without difficulty LMA: LMA inserted LMA Size: 5.0 Number of attempts: 2 Tube secured with: Tape Dental Injury: Teeth and Oropharynx as per pre-operative assessment

## 2018-05-10 NOTE — Anesthesia Postprocedure Evaluation (Signed)
Anesthesia Post Note  Patient: Tom Moore  Procedure(s) Performed: CARPAL TUNNEL RELEASE (Left Arm Lower)  Patient location during evaluation: PACU Anesthesia Type: General Level of consciousness: awake and alert Pain management: pain level controlled Vital Signs Assessment: post-procedure vital signs reviewed and stable Respiratory status: spontaneous breathing, nonlabored ventilation, respiratory function stable and patient connected to nasal cannula oxygen Cardiovascular status: blood pressure returned to baseline and stable Postop Assessment: no apparent nausea or vomiting Anesthetic complications: no     Last Vitals:  Vitals:   05/08/18 1143 05/08/18 1217  BP: 120/79 123/82  Pulse: 68 63  Resp: 16 16  Temp: 36.4 C   SpO2: 95% 99%    Last Pain:  Vitals:   05/08/18 1217  TempSrc:   PainSc: 0-No pain                 Martha Clan

## 2018-05-12 ENCOUNTER — Ambulatory Visit: Payer: Medicare Other | Admitting: Family Medicine

## 2018-05-12 ENCOUNTER — Encounter: Payer: Self-pay | Admitting: Family Medicine

## 2018-05-12 VITALS — BP 112/60 | HR 77 | Temp 98.0°F | Resp 16 | Wt 222.8 lb

## 2018-05-12 DIAGNOSIS — R0602 Shortness of breath: Secondary | ICD-10-CM

## 2018-05-12 DIAGNOSIS — R42 Dizziness and giddiness: Secondary | ICD-10-CM

## 2018-05-12 NOTE — Progress Notes (Signed)
  Subjective:     Patient ID: Tom Moore, male   DOB: 28-Feb-1946, 72 y.o.   MRN: [id number] Chief Complaint  Patient presents with  . Fatigue    Patient comes in office today with concerns of feeling light headed and weak, patient states that yesterday evening he felt very tired and lightheaded to the point patient thouht he would pass out. Patient reports this lasted a few minutes and went away but again today he had same symptoms.    HPI States that he was sitting on the couch watching tv when it occurred. He put his head down and took deep breathes and felt better. He subsequently fell asleep on the couch. This AM transiently felt mildly short of breath. Has had recent carpal tunnel release surgery on 8/12.  Review of Systems     Objective:   Physical Exam  Constitutional: He appears well-developed and well-nourished. No distress.  Neck: Carotid bruit is not present.  Cardiovascular: Normal rate.  Irregular rhythm  Pulmonary/Chest: Breath sounds normal.  Musculoskeletal: He exhibits no edema (of lower extremities).       Assessment:    1. Shortness of breath: transient - CBC with Differential/Platelet - Renal function panel  2. Episode of dizziness: transient - CBC with Differential/Platelet - Renal function panel    Plan:    Taper back gabapentin for a week then stop if no longer needed. Further f/u pending lab results.

## 2018-05-12 NOTE — Patient Instructions (Addendum)
Decrease gabapentin to one pill daily for a week then stop if no longer needed. Stop prednisone if no longer needed. We will call you with the lab results.

## 2018-05-13 LAB — CBC WITH DIFFERENTIAL/PLATELET
BASOS: 1 %
Basophils Absolute: 0 10*3/uL (ref 0.0–0.2)
EOS (ABSOLUTE): 0.3 10*3/uL (ref 0.0–0.4)
EOS: 5 %
HEMATOCRIT: 41.3 % (ref 37.5–51.0)
Hemoglobin: 13.5 g/dL (ref 13.0–17.7)
IMMATURE GRANS (ABS): 0 10*3/uL (ref 0.0–0.1)
IMMATURE GRANULOCYTES: 0 %
LYMPHS: 30 %
Lymphocytes Absolute: 1.7 10*3/uL (ref 0.7–3.1)
MCH: 29.6 pg (ref 26.6–33.0)
MCHC: 32.7 g/dL (ref 31.5–35.7)
MCV: 91 fL (ref 79–97)
MONOCYTES: 7 %
Monocytes Absolute: 0.4 10*3/uL (ref 0.1–0.9)
NEUTROS ABS: 3.3 10*3/uL (ref 1.4–7.0)
NEUTROS PCT: 57 %
PLATELETS: 209 10*3/uL (ref 150–450)
RBC: 4.56 x10E6/uL (ref 4.14–5.80)
RDW: 14.2 % (ref 12.3–15.4)
WBC: 5.8 10*3/uL (ref 3.4–10.8)

## 2018-05-13 LAB — RENAL FUNCTION PANEL
ALBUMIN: 4.5 g/dL (ref 3.5–4.8)
BUN/Creatinine Ratio: 14 (ref 10–24)
BUN: 24 mg/dL (ref 8–27)
CO2: 19 mmol/L — ABNORMAL LOW (ref 20–29)
CREATININE: 1.72 mg/dL — AB (ref 0.76–1.27)
Calcium: 10 mg/dL (ref 8.6–10.2)
Chloride: 104 mmol/L (ref 96–106)
GFR calc Af Amer: 45 mL/min/{1.73_m2} — ABNORMAL LOW (ref >59)
GFR, EST NON AFRICAN AMERICAN: 39 mL/min/{1.73_m2} — AB (ref >59)
Glucose: 215 mg/dL — ABNORMAL HIGH (ref 65–99)
PHOSPHORUS: 3.5 mg/dL (ref 2.5–4.5)
Potassium: 5.2 mmol/L (ref 3.5–5.2)
SODIUM: 142 mmol/L (ref 134–144)

## 2018-05-15 ENCOUNTER — Telehealth: Payer: Self-pay

## 2018-05-15 NOTE — Telephone Encounter (Signed)
-----   Message from Carmon Ginsberg, [place] sent at 05/15/2018  7:32 AM EDT ----- No anemia, kidney function is stable. Sugar is in the 200's.

## 2018-05-15 NOTE — Telephone Encounter (Signed)
[id number] 

## 2018-05-19 NOTE — Telephone Encounter (Signed)
Pt advised.   Thanks,   -Laura  

## 2018-05-26 ENCOUNTER — Other Ambulatory Visit: Payer: Self-pay | Admitting: Family Medicine

## 2018-06-07 ENCOUNTER — Ambulatory Visit: Payer: Medicare Other | Admitting: Urology

## 2018-06-07 ENCOUNTER — Encounter: Payer: Self-pay | Admitting: Urology

## 2018-06-08 ENCOUNTER — Other Ambulatory Visit (INDEPENDENT_AMBULATORY_CARE_PROVIDER_SITE_OTHER): Payer: Self-pay | Admitting: Vascular Surgery

## 2018-06-08 DIAGNOSIS — M79605 Pain in left leg: Secondary | ICD-10-CM

## 2018-06-08 DIAGNOSIS — M79604 Pain in right leg: Secondary | ICD-10-CM

## 2018-06-09 ENCOUNTER — Encounter (INDEPENDENT_AMBULATORY_CARE_PROVIDER_SITE_OTHER): Payer: Self-pay | Admitting: Vascular Surgery

## 2018-06-09 ENCOUNTER — Ambulatory Visit (INDEPENDENT_AMBULATORY_CARE_PROVIDER_SITE_OTHER): Payer: Medicare Other | Admitting: Vascular Surgery

## 2018-06-09 ENCOUNTER — Ambulatory Visit (INDEPENDENT_AMBULATORY_CARE_PROVIDER_SITE_OTHER): Payer: Medicare Other

## 2018-06-09 VITALS — BP 105/65 | HR 75 | Resp 15 | Ht 69.0 in | Wt 228.0 lb

## 2018-06-09 DIAGNOSIS — E1122 Type 2 diabetes mellitus with diabetic chronic kidney disease: Secondary | ICD-10-CM

## 2018-06-09 DIAGNOSIS — M79604 Pain in right leg: Secondary | ICD-10-CM | POA: Diagnosis not present

## 2018-06-09 DIAGNOSIS — N183 Chronic kidney disease, stage 3 (moderate): Secondary | ICD-10-CM

## 2018-06-09 DIAGNOSIS — I10 Essential (primary) hypertension: Secondary | ICD-10-CM

## 2018-06-09 DIAGNOSIS — I872 Venous insufficiency (chronic) (peripheral): Secondary | ICD-10-CM

## 2018-06-09 DIAGNOSIS — M79605 Pain in left leg: Secondary | ICD-10-CM

## 2018-06-09 DIAGNOSIS — E782 Mixed hyperlipidemia: Secondary | ICD-10-CM | POA: Diagnosis not present

## 2018-06-09 NOTE — Progress Notes (Signed)
Patient ID: Tom Moore, male   DOB: [birth year], 72 y.o.   MRN: [id number]  Chief Complaint  Patient presents with  . New Patient (Initial Visit)    ABI consult     HPI Tom Moore is a 72 y.o. male.  I am asked to see the patient by Dr. Cleda Mccreedy for evaluation of PAD.  The patient reports going to see his podiatrist for toenail care but given the dark discoloration of his feet and his multiple atherosclerotic risk factors, assessment for PAD was suggested prior to any work being done on his foot.  He has had prominent varicose veins for many years.  These are not particularly painful.  He does not have a history of DVT or superficial thrombophlebitis to his knowledge.  He says his legs really do not hurt much.  He has some mild swelling but that is not overly bothersome to him. ABIs were done today to evaluate his arterial perfusion.  He was found to have brisk triphasic waveforms in both lower extremities with ABIs of 1.25 on the right and 1.17 on the left consistent with no arterial insufficiency in either lower extremity   Past Medical History:  Diagnosis Date  . Anxiety   . Arthritis   . Depression   . Diabetes mellitus without complication ([place])   . Dysrhythmia    atrial fib  . GERD (gastroesophageal reflux disease)   . Hyperlipidemia   . Hypertension   . Hyperthyroidism   . Insomnia   . Sleep apnea    unable to tolerater CPAP    Past Surgical History:  Procedure Laterality Date  . CARPAL TUNNEL RELEASE Right 04/10/2018   Procedure: RELEASE MEDIAN NERVE AT CARPAL TUNNEL;  Surgeon: Earnestine Leys, MD;  Location: [facility] ORS;  Service: Orthopedics;  Laterality: Right;  . CARPAL TUNNEL RELEASE Left 05/08/2018   Procedure: CARPAL TUNNEL RELEASE;  Surgeon: Earnestine Leys, MD;  Location: [facility] ORS;  Service: Orthopedics;  Laterality: Left;  . TONSILLECTOMY      Family History  Problem Relation Age of Onset  . Hypertension Mother   . Heart disease Mother      Social  History Social History   Tobacco Use  . Smoking status: Former Smoker    Last attempt to quit: 04/03/1988    Years since quitting: 30.2  . Smokeless tobacco: Never Used  Substance Use Topics  . Alcohol use: Yes    Comment: Ocassional alcohol use, Drinks wine.  . Drug use: Never     Allergies  Allergen Reactions  . Zyrtec Allergy [Cetirizine] Diarrhea    Current Outpatient Medications  Medication Sig Dispense Refill  . apixaban (ELIQUIS) 5 MG TABS tablet Take 5 mg by mouth 2 (two) times daily.     Marland Kitchen atorvastatin (LIPITOR) 40 MG tablet Take 40 mg by mouth daily.    . furosemide (LASIX) 40 MG tablet Take 1 tablet (40 mg total) by mouth 2 (two) times daily. 6 tablet 0  . gabapentin (NEURONTIN) 400 MG capsule Take 1 capsule (400 mg total) by mouth 2 (two) times daily. 60 capsule 3  . glipiZIDE (GLUCOTROL) 10 MG tablet TAKE ONE TABLET BY MOUTH THREE TIMES DAILY BEFORE MEALS (Patient taking differently: TAKE ONE TABLET BY MOUTH TWO TIMES DAILY BEFORE MEALS) 270 tablet 0  . glucose blood (ONE TOUCH ULTRA TEST) test strip Use 3 (three) times daily One touch ultra test strips e11.29    . insulin NPH Human (HUMULIN N,NOVOLIN N) 100  UNIT/ML injection Take 15 units BID 12 hours apart (8 am, 8 pm approximately) *[facility]ion brand only please*    . lisinopril (PRINIVIL,ZESTRIL) 20 MG tablet 1 tab daily  3  . Magnesium Oxide 400 MG CAPS Take 1 capsule (400 mg total) by mouth 2 (two) times daily. 180 capsule 3  . metFORMIN (GLUCOPHAGE) 1000 MG tablet TAKE 1 TABLET BY MOUTH TWICE DAILY WITH A MEAL 180 tablet 0  . methimazole (TAPAZOLE) 5 MG tablet Take 5 mg by mouth.    . metoprolol succinate (TOPROL-XL) 100 MG 24 hr tablet Take 1 tablet by mouth daily.   0  . multivitamin-iron-minerals-folic acid (THERAPEUTIC-M) TABS tablet Take 1 tablet by mouth daily.    Marland Kitchen omeprazole (PRILOSEC) 40 MG capsule TAKE 1 CAPSULE(40 MG) BY MOUTH DAILY 90 capsule 0  . oxybutynin (DITROPAN) 5 MG tablet 1 tablet at bedtime 30  tablet 0  . predniSONE (DELTASONE) 20 MG tablet prednisone 20 mg tablet    . spironolactone (ALDACTONE) 25 MG tablet TAKE 1 TABLET BY MOUTH EVERY DAY    . tamsulosin (FLOMAX) 0.4 MG CAPS capsule TAKE 1 CAPSULE BY MOUTH ONCE DAILY 90 capsule 1  . triamcinolone cream (KENALOG) 0.1 % Apply to left ear rash 2-3 x day as needed for itching. 30 g 1  . venlafaxine XR (EFFEXOR-XR) 150 MG 24 hr capsule Take 1 capsule (150 mg total) by mouth daily with breakfast. 90 capsule 1  . diltiazem (CARDIZEM CD) 120 MG 24 hr capsule Take 120 mg by mouth daily.     Marland Kitchen HYDROcodone-acetaminophen (NORCO) 5-325 MG tablet Take 1 tablet by mouth every 6 (six) hours as needed. (Patient not taking: Reported on 06/09/2018) 40 tablet 0   No current facility-administered medications for this visit.       REVIEW OF SYSTEMS (Negative unless checked)  Constitutional: '[]'$ Weight loss  '[]'$ Fever  '[]'$ Chills Cardiac: '[]'$ Chest pain   '[]'$ Chest pressure   '[x]'$ Palpitations   '[]'$ Shortness of breath when laying flat   '[]'$ Shortness of breath at rest   '[]'$ Shortness of breath with exertion. Vascular:  '[]'$ Pain in legs with walking   '[]'$ Pain in legs at rest   '[]'$ Pain in legs when laying flat   '[]'$ Claudication   '[]'$ Pain in feet when walking  '[]'$ Pain in feet at rest  '[]'$ Pain in feet when laying flat   '[]'$ History of DVT   '[]'$ Phlebitis   '[x]'$ Swelling in legs   '[x]'$ Varicose veins   '[]'$ Non-healing ulcers Pulmonary:   '[]'$ Uses home oxygen   '[]'$ Productive cough   '[]'$ Hemoptysis   '[]'$ Wheeze  '[]'$ COPD   '[]'$ Asthma Neurologic:  '[]'$ Dizziness  '[]'$ Blackouts   '[]'$ Seizures   '[]'$ History of stroke   '[]'$ History of TIA  '[]'$ Aphasia   '[]'$ Temporary blindness   '[]'$ Dysphagia   '[]'$ Weakness or numbness in arms   '[]'$ Weakness or numbness in legs Musculoskeletal:  '[x]'$ Arthritis   '[]'$ Joint swelling   '[]'$ Joint pain   '[]'$ Low back pain Hematologic:  '[]'$ Easy bruising  '[]'$ Easy bleeding   '[]'$ Hypercoagulable state   '[]'$ Anemic  '[]'$ Hepatitis Gastrointestinal:  '[]'$ Blood in stool   '[]'$ Vomiting blood  '[x]'$ Gastroesophageal reflux/heartburn    '[]'$ Abdominal pain Genitourinary:  '[]'$ Chronic kidney disease   '[]'$ Difficult urination  '[]'$ Frequent urination  '[]'$ Burning with urination   '[]'$ Hematuria Skin:  '[]'$ Rashes   '[]'$ Ulcers   '[]'$ Wounds Psychological:  '[x]'$ History of anxiety   '[]'$  History of major depression.    Physical Exam BP 105/65 (BP Location: Right Arm, Patient Position: Sitting)   Pulse 75   Resp 15   Ht '5\' 9"'$  (1.753 m)  Wt 228 lb (103.4 kg)   BMI 33.67 kg/m  Gen:  WD/WN, NAD Head: [place]/AT, No temporalis wasting.  Ear/Nose/Throat: Hearing grossly intact, nares w/o erythema or drainage, oropharynx w/o Erythema/Exudate Eyes: Conjunctiva clear, sclera non-icteric  Neck: trachea midline.   Pulmonary:  Good air movement, no use of accessory muscles Cardiac: RRR, no JVD Vascular:  Vessel Right Left  Radial Palpable Palpable                          PT 1+ Palpable 1+ Palpable  DP Palpable Palpable    Musculoskeletal: M/S 5/5 throughout.  Extremities without ischemic changes.  No deformity or atrophy.  Trace right lower extremity edema, trace to 1+ left lower extremity edema.  Stasis dermatitis changes are present bilaterally a little worse on the left than the right.  Prominent varicose veins are seen more on the left and the right Neurologic: Sensation grossly intact in extremities.  Symmetrical.  Speech is fluent. Motor exam as listed above. Psychiatric: Judgment intact, Mood & affect appropriate for pt's clinical situation. Dermatologic: No rashes or ulcers noted.  No cellulitis or open wounds.   Radiology No results found.  Labs Recent Results (from the past 2160 hour(s))  Glucose, capillary     Status: Abnormal   Collection Time: 04/10/18  9:56 AM  Result Value Ref Range   Glucose-Capillary 190 (H) 70 - 99 mg/dL  Glucose, capillary     Status: Abnormal   Collection Time: 04/10/18 12:06 PM  Result Value Ref Range   Glucose-Capillary 174 (H) 70 - 99 mg/dL  Basic metabolic panel     Status: Abnormal   Collection  Time: 05/01/18  9:54 AM  Result Value Ref Range   Sodium 138 135 - 145 mmol/L   Potassium 4.7 3.5 - 5.1 mmol/L   Chloride 100 98 - 111 mmol/L   CO2 24 22 - 32 mmol/L   Glucose, Bld 224 (H) 70 - 99 mg/dL   BUN 27 (H) 8 - 23 mg/dL   Creatinine, Ser 1.61 (H) 0.61 - 1.24 mg/dL   Calcium 9.3 8.9 - 10.3 mg/dL   GFR calc non Af Amer 41 (L) >60 mL/min   GFR calc Af Amer 48 (L) >60 mL/min    Comment: (NOTE) The eGFR has been calculated using the CKD EPI equation. This calculation has not been validated in all clinical situations. eGFR's persistently <60 mL/min signify possible Chronic Kidney Disease.    Anion gap 14 5 - 15    Comment: Performed at [facility], [address] [place], [place] [address]  Glucose, capillary     Status: Abnormal   Collection Time: 05/08/18  8:57 AM  Result Value Ref Range   Glucose-Capillary 114 (H) 70 - 99 mg/dL  Glucose, capillary     Status: Abnormal   Collection Time: 05/08/18 11:05 AM  Result Value Ref Range   Glucose-Capillary 131 (H) 70 - 99 mg/dL  CBC with Differential/Platelet     Status: None   Collection Time: 05/12/18 12:34 PM  Result Value Ref Range   WBC 5.8 3.4 - 10.8 x10E3/uL   RBC 4.56 4.14 - 5.80 x10E6/uL   Hemoglobin 13.5 13.0 - 17.7 g/dL   Hematocrit 41.3 37.5 - 51.0 %   MCV 91 79 - 97 fL   MCH 29.6 26.6 - 33.0 pg   MCHC 32.7 31.5 - 35.7 g/dL   RDW 14.2 12.3 - 15.4 %   Platelets 209  150 - 450 x10E3/uL   Neutrophils 57 Not Estab. %   Lymphs 30 Not Estab. %   Monocytes 7 Not Estab. %   Eos 5 Not Estab. %   Basos 1 Not Estab. %   Neutrophils Absolute 3.3 1.4 - 7.0 x10E3/uL   Lymphocytes Absolute 1.7 0.7 - 3.1 x10E3/uL   Monocytes Absolute 0.4 0.1 - 0.9 x10E3/uL   EOS (ABSOLUTE) 0.3 0.0 - 0.4 x10E3/uL   Basophils Absolute 0.0 0.0 - 0.2 x10E3/uL   Immature Granulocytes 0 Not Estab. %   Immature Grans (Abs) 0.0 0.0 - 0.1 x10E3/uL  Renal function panel     Status: Abnormal   Collection Time: 05/12/18 12:34 PM  Result  Value Ref Range   Glucose 215 (H) 65 - 99 mg/dL   BUN 24 8 - 27 mg/dL   Creatinine, Ser 1.72 (H) 0.76 - 1.27 mg/dL   GFR calc non Af Amer 39 (L) >59 mL/min/1.73   GFR calc Af Amer 45 (L) >59 mL/min/1.73   BUN/Creatinine Ratio 14 10 - 24   Sodium 142 134 - 144 mmol/L   Potassium 5.2 3.5 - 5.2 mmol/L   Chloride 104 96 - 106 mmol/L   CO2 19 (L) 20 - 29 mmol/L   Calcium 10.0 8.6 - 10.2 mg/dL   Phosphorus 3.5 2.5 - 4.5 mg/dL   Albumin 4.5 3.5 - 4.8 g/dL    Assessment/Plan:  Hypertension blood pressure control important in reducing the progression of atherosclerotic disease. On appropriate oral medications.   Hyperlipidemia lipid control important in reducing the progression of atherosclerotic disease. Continue statin therapy   Stasis dermatitis of both legs ABIs were done today to evaluate his arterial perfusion.  He was found to have brisk triphasic waveforms in both lower extremities with ABIs of 1.25 on the right and 1.17 on the left consistent with no arterial insufficiency in either lower extremity. He has adequate arterial perfusion to do any necessary procedures to his feet.  Even his multiple atherosclerotic risk factors and the appearance of his feet, I think it was very prudent to assess his arterial perfusion prior to any procedures.   He says the varicose veins and the stasis dermatitis are not that bothersome to him so no surgery or procedures will be planned at this time.  He may benefit from compression stockings and elevation.  I will see him back as needed      Leotis Pain 06/09/2018, 10:53 AM   This note was created with Dragon medical transcription system.  Any errors from dictation are unintentional.

## 2018-06-09 NOTE — Assessment & Plan Note (Signed)
lipid control important in reducing the progression of atherosclerotic disease. Continue statin therapy  

## 2018-06-09 NOTE — Assessment & Plan Note (Addendum)
ABIs were done today to evaluate his arterial perfusion.  He was found to have brisk triphasic waveforms in both lower extremities with ABIs of 1.25 on the right and 1.17 on the left consistent with no arterial insufficiency in either lower extremity. He has adequate arterial perfusion to do any necessary procedures to his feet.  Even his multiple atherosclerotic risk factors and the appearance of his feet, I think it was very prudent to assess his arterial perfusion prior to any procedures.   He says the varicose veins and the stasis dermatitis are not that bothersome to him so no surgery or procedures will be planned at this time.  He may benefit from compression stockings and elevation.  I will see him back as needed

## 2018-06-09 NOTE — Assessment & Plan Note (Signed)
blood pressure control important in reducing the progression of atherosclerotic disease. On appropriate oral medications.  

## 2018-06-19 ENCOUNTER — Ambulatory Visit: Payer: Medicare Other | Admitting: Physician Assistant

## 2018-06-19 ENCOUNTER — Encounter: Payer: Self-pay | Admitting: Physician Assistant

## 2018-06-19 VITALS — BP 100/74 | HR 72 | Temp 97.8°F | Resp 16 | Wt 230.0 lb

## 2018-06-19 DIAGNOSIS — F339 Major depressive disorder, recurrent, unspecified: Secondary | ICD-10-CM

## 2018-06-19 DIAGNOSIS — R0602 Shortness of breath: Secondary | ICD-10-CM | POA: Diagnosis not present

## 2018-06-19 DIAGNOSIS — R5383 Other fatigue: Secondary | ICD-10-CM

## 2018-06-19 DIAGNOSIS — E118 Type 2 diabetes mellitus with unspecified complications: Secondary | ICD-10-CM | POA: Diagnosis not present

## 2018-06-19 DIAGNOSIS — R400 Somnolence: Secondary | ICD-10-CM

## 2018-06-19 DIAGNOSIS — I5189 Other ill-defined heart diseases: Secondary | ICD-10-CM

## 2018-06-19 MED ORDER — GLIPIZIDE 10 MG PO TABS: ORAL_TABLET | ORAL | 1 refills | Status: DC

## 2018-06-19 MED ORDER — FUROSEMIDE 40 MG PO TABS: 40.0000 mg | ORAL_TABLET | Freq: Two times a day (BID) | ORAL | 1 refills | Status: DC

## 2018-06-19 MED ORDER — SERTRALINE HCL 50 MG PO TABS: 50.0000 mg | ORAL_TABLET | Freq: Every day | ORAL | 1 refills | Status: DC

## 2018-06-19 NOTE — Progress Notes (Signed)
Patient: Tom Moore Male    DOB: 07-Feb-1946   72 y.o.   MRN: [id number] Visit Date: 06/19/2018  Today's Provider: Mar Daring, PA-C   Chief Complaint  Patient presents with  . Fatigue  . Shortness of Breath   Subjective:    HPI Patient here today C/O fatigue and shortness of breath x's yesterday. Patient reports that 2 years ago he had similar symptoms and was seen in [facility] ER. Patient had extensive work up and was told his heart was normal. Patient reports he was told he had a lot of fluid in his body. Patient reports cough since yesterday and denies any swelling.     Allergies  Allergen Reactions  . Zyrtec Allergy [Cetirizine] Diarrhea     Current Outpatient Medications:  .  apixaban (ELIQUIS) 5 MG TABS tablet, Take 5 mg by mouth 2 (two) times daily. , Disp: , Rfl:  .  atorvastatin (LIPITOR) 40 MG tablet, Take 40 mg by mouth daily., Disp: , Rfl:  .  diltiazem (CARDIZEM CD) 120 MG 24 hr capsule, Take 120 mg by mouth daily. , Disp: , Rfl:  .  furosemide (LASIX) 40 MG tablet, Take 1 tablet (40 mg total) by mouth 2 (two) times daily., Disp: 6 tablet, Rfl: 0 .  gabapentin (NEURONTIN) 400 MG capsule, Take 1 capsule (400 mg total) by mouth 2 (two) times daily., Disp: 60 capsule, Rfl: 3 .  glipiZIDE (GLUCOTROL) 10 MG tablet, TAKE ONE TABLET BY MOUTH THREE TIMES DAILY BEFORE MEALS (Patient taking differently: TAKE ONE TABLET BY MOUTH TWO TIMES DAILY BEFORE MEALS), Disp: 270 tablet, Rfl: 0 .  glucose blood (ONE TOUCH ULTRA TEST) test strip, Use 3 (three) times daily One touch ultra test strips e11.29, Disp: , Rfl:  .  insulin NPH Human (HUMULIN N,NOVOLIN N) 100 UNIT/ML injection, Take 15 units BID 12 hours apart (8 am, 8 pm approximately) *Relion brand only please*, Disp: , Rfl:  .  lisinopril (PRINIVIL,ZESTRIL) 20 MG tablet, Take 40 mg by mouth daily. , Disp: , Rfl: 3 .  Magnesium Oxide 400 MG CAPS, Take 1 capsule (400 mg total) by mouth 2 (two) times daily., Disp: 180  capsule, Rfl: 3 .  metFORMIN (GLUCOPHAGE) 1000 MG tablet, TAKE 1 TABLET BY MOUTH TWICE DAILY WITH A MEAL, Disp: 180 tablet, Rfl: 0 .  methimazole (TAPAZOLE) 5 MG tablet, Take 5 mg by mouth., Disp: , Rfl:  .  metoprolol succinate (TOPROL-XL) 100 MG 24 hr tablet, Take 200 mg by mouth daily. , Disp: , Rfl: 0 .  multivitamin-iron-minerals-folic acid (THERAPEUTIC-M) TABS tablet, Take 1 tablet by mouth daily., Disp: , Rfl:  .  omeprazole (PRILOSEC) 40 MG capsule, TAKE 1 CAPSULE(40 MG) BY MOUTH DAILY, Disp: 90 capsule, Rfl: 0 .  oxybutynin (DITROPAN) 5 MG tablet, 1 tablet at bedtime, Disp: 30 tablet, Rfl: 0 .  spironolactone (ALDACTONE) 25 MG tablet, TAKE 1 TABLET BY MOUTH EVERY DAY, Disp: , Rfl:  .  tamsulosin (FLOMAX) 0.4 MG CAPS capsule, TAKE 1 CAPSULE BY MOUTH ONCE DAILY, Disp: 90 capsule, Rfl: 1 .  triamcinolone cream (KENALOG) 0.1 %, Apply to left ear rash 2-3 x day as needed for itching., Disp: 30 g, Rfl: 1 .  venlafaxine XR (EFFEXOR-XR) 150 MG 24 hr capsule, Take 1 capsule (150 mg total) by mouth daily with breakfast., Disp: 90 capsule, Rfl: 1  Review of Systems  Constitutional: Positive for activity change and fatigue.  HENT: Negative.  Negative for congestion,  postnasal drip, rhinorrhea, sinus pressure, sinus pain and sore throat.   Respiratory: Positive for cough and shortness of breath.   Cardiovascular: Negative for chest pain, palpitations and leg swelling.  Neurological: Negative.     Social History   Tobacco Use  . Smoking status: Former Smoker    Last attempt to quit: 04/03/1988    Years since quitting: 30.2  . Smokeless tobacco: Never Used  Substance Use Topics  . Alcohol use: Yes    Comment: Ocassional alcohol use, Drinks wine.   Objective:   BP 100/74 (BP Location: Left Arm, Patient Position: Sitting, Cuff Size: Large)   Pulse 72   Temp 97.8 F (36.6 C) (Oral)   Resp 16   Wt 230 lb (104.3 kg)   SpO2 96%   BMI 33.97 kg/m  Vitals:   06/19/18 1550  BP: 100/74    Pulse: 72  Resp: 16  Temp: 97.8 F (36.6 C)  TempSrc: Oral  SpO2: 96%  Weight: 230 lb (104.3 kg)     Physical Exam  Constitutional: He appears well-developed and well-nourished. No distress.  HENT:  Head: Normocephalic and atraumatic.  Right Ear: Hearing, tympanic membrane and external ear normal.  Left Ear: Hearing, tympanic membrane and external ear normal.  Nose: Nose normal.  Mouth/Throat: Oropharynx is clear and moist. No oropharyngeal exudate.  Eyes: Pupils are equal, round, and reactive to light. Conjunctivae and EOM are normal. Right eye exhibits no discharge. Left eye exhibits no discharge.  Neck: Normal range of motion. Neck supple. No JVD present. No tracheal deviation present. No Brudzinski's sign and no Kernig's sign noted. No thyromegaly present.  Cardiovascular: Normal rate, regular rhythm and normal heart sounds. Exam reveals no gallop and no friction rub.  No murmur heard. Pulmonary/Chest: Effort normal and breath sounds normal. No stridor. No respiratory distress. He has no wheezes. He has no rales. He exhibits no tenderness.  Musculoskeletal:       Right lower leg: He exhibits edema (1+ pitting edema).  Lymphadenopathy:    He has no cervical adenopathy.  Skin: Skin is warm and dry.        Assessment & Plan:     1. Controlled type 2 diabetes mellitus with complication, without long-term current use of insulin (HCC) A1c up slightly from 6.8 to 7.4. Discussed lifestyle modifications. Diagnosis pulled for medication refill. Continue current medical treatment plan. - POCT glycosylated hemoglobin (Hb A1C) - glipiZIDE (GLUCOTROL) 10 MG tablet; TAKE ONE TABLET BY MOUTH THREE TIMES DAILY BEFORE MEALS  Dispense: 270 tablet; Refill: 1  2. SOB (shortness of breath) on exertion Unsure of cause. Will check labs to see if patient having exacerbation of heart failure. CXR also ordered to r/o pneumonia. No adventitious sounds or consolidation heard today on auscultation. I  will f/u pending results.  - CBC w/Diff/Platelet - B Nat Peptide - Basic Metabolic Panel (BMET) - DG Chest 2 View; Future  3. Fatigue, unspecified type See above medical treatment plan. - CBC w/Diff/Platelet - B Nat Peptide - Basic Metabolic Panel (BMET) - DG Chest 2 View; Future  4. Daytime somnolence See above medical treatment plan. - CBC w/Diff/Platelet - B Nat Peptide - Basic Metabolic Panel (BMET) - DG Chest 2 View; Future  5. Diastolic dysfunction Increase furosemide as below for extra fluid noted. F/U in 6 weeks to recheck labs. Check weight daily and add extra furosemide if weight up 2 pounds in one day.  - CBC w/Diff/Platelet - B Nat Peptide - Basic Metabolic  Panel (BMET) - furosemide (LASIX) 40 MG tablet; Take 1 tablet (40 mg total) by mouth 2 (two) times daily.  Dispense: 180 tablet; Refill: 1  6. Depression, recurrent ([place]) Also mentions having worsening mood with more agitation and easily crying. Will start sertraline as below. Return in 6 weeks fofr re-evaluation and labs as noted above.  - sertraline (ZOLOFT) 50 MG tablet; Take 1 tablet (50 mg total) by mouth at bedtime.  Dispense: 30 tablet; Refill: [place], PA-C  [facility]

## 2018-06-19 NOTE — Patient Instructions (Signed)
Heart Failure Heart failure is a condition in which the heart has trouble pumping blood because it has become weak or stiff. This means that the heart does not pump blood efficiently for the body to work well. For some people with heart failure, fluid may back up into the lungs and there may be swelling (edema) in the lower legs. Heart failure is usually a long-term (chronic) condition. It is important for you to take good care of yourself and follow the treatment plan from your health care provider. What are the causes? This condition is caused by some health problems, including:  High blood pressure (hypertension). Hypertension causes the heart muscle to work harder than normal. High blood pressure eventually causes the heart to become stiff and weak.  Coronary artery disease (CAD). CAD is the buildup of cholesterol and fat (plaques) in the arteries of the heart.  Heart attack (myocardial infarction). Injured tissue, which is caused by the heart attack, does not contract as well and the heart's ability to pump blood is weakened.  Abnormal heart valves. When the heart valves do not open and close properly, the heart muscle must pump harder to keep the blood flowing.  Heart muscle disease (cardiomyopathy or myocarditis). Heart muscle disease is damage to the heart muscle from a variety of causes, such as drug or alcohol abuse, infections, or unknown causes. These can increase the risk of heart failure.  Lung disease. When the lungs do not work properly, the heart must work harder.  What increases the risk? Risk of heart failure increases as a person ages. This condition is also more likely to develop in people who:  Are overweight.  Are male.  Smoke or chew tobacco.  Abuse alcohol or illegal drugs.  Have taken medicines that can damage the heart, such as chemotherapy drugs.  Have diabetes. ? High blood sugar (glucose) is associated with high fat (lipid) levels in the blood. ? Diabetes  can also damage tiny blood vessels that carry nutrients to the heart muscle.  Have abnormal heart rhythms.  Have thyroid problems.  Have low blood counts (anemia).  What are the signs or symptoms? Symptoms of this condition include:  Shortness of breath with activity, such as when climbing stairs.  Persistent cough.  Swelling of the feet, ankles, legs, or abdomen.  Unexplained weight gain.  Difficulty breathing when lying flat (orthopnea).  Waking from sleep because of the need to sit up and get more air.  Rapid heartbeat.  Fatigue and loss of energy.  Feeling light-headed, dizzy, or close to fainting.  Loss of appetite.  Nausea.  Increased urination during the night (nocturia).  Confusion.  How is this diagnosed? This condition is diagnosed based on:  Medical history, symptoms, and a physical exam.  Diagnostic tests, which may include: ? Echocardiogram. ? Electrocardiogram (ECG). ? Chest X-ray. ? Blood tests. ? Exercise stress test. ? Radionuclide scans. ? Cardiac catheterization and angiogram.  How is this treated? Treatment for this condition is aimed at managing the symptoms of heart failure. Medicines, behavioral changes, or other treatments may be necessary to treat heart failure. Medicines These may include:  Angiotensin-converting enzyme (ACE) inhibitors. This type of medicine blocks the effects of a blood protein called angiotensin-converting enzyme. ACE inhibitors relax (dilate) the blood vessels and help to lower blood pressure.  Angiotensin receptor blockers (ARBs). This type of medicine blocks the actions of a blood protein called angiotensin. ARBs dilate the blood vessels and help to lower blood pressure.  Water   pills (diuretics). Diuretics cause the kidneys to remove salt and water from the blood. The extra fluid is removed through urination, leaving a lower volume of blood that the heart has to pump.  Beta blockers. These improve heart  muscle strength and they prevent the heart from beating too quickly.  Digoxin. This increases the force of the heartbeat.  Healthy behavior changes These may include:  Reaching and maintaining a healthy weight.  Stopping smoking or chewing tobacco.  Eating heart-healthy foods.  Limiting or avoiding alcohol.  Stopping use of street drugs (illegal drugs).  Physical activity.  Other treatments These may include:  Surgery to open blocked coronary arteries or repair damaged heart valves.  Placement of a biventricular pacemaker to improve heart muscle function (cardiac resynchronization therapy). This device paces both the right ventricle and left ventricle.  Placement of a device to treat serious abnormal heart rhythms (implantable cardioverter defibrillator, or ICD).  Placement of a device to improve the pumping ability of the heart (left ventricular assist device, or LVAD).  Heart transplant. This can cure heart failure, and it is considered for certain patients who do not improve with other therapies.  Follow these instructions at home: Medicines  Take over-the-counter and prescription medicines only as told by your health care provider. Medicines are important in reducing the workload of your heart, slowing the progression of heart failure, and improving your symptoms. ? Do not stop taking your medicine unless your health care provider told you to do that. ? Do not skip any dose of medicine. ? Refill your prescriptions before you run out of medicine. You need your medicines every day. Eating and drinking   Eat heart-healthy foods. Talk with a dietitian to make an eating plan that is right for you. ? Choose foods that contain no trans fat and are low in saturated fat and cholesterol. Healthy choices include fresh or frozen fruits and vegetables, fish, lean meats, legumes, fat-free or low-fat dairy products, and whole-grain or high-fiber foods. ? Limit salt (sodium) if  directed by your health care provider. Sodium restriction may reduce symptoms of heart failure. Ask a dietitian to recommend heart-healthy seasonings. ? Use healthy cooking methods instead of frying. Healthy methods include roasting, grilling, broiling, baking, poaching, steaming, and stir-frying.  Limit your fluid intake if directed by your health care provider. Fluid restriction may reduce symptoms of heart failure. Lifestyle  Stop smoking or using chewing tobacco. Nicotine and tobacco can damage your heart and your blood vessels. Do not use nicotine gum or patches before talking to your health care provider.  Limit alcohol intake to no more than 1 drink per day for non-pregnant women and 2 drinks per day for men. One drink equals 12 oz of beer, 5 oz of wine, or 1 oz of hard liquor. ? Drinking more than that is harmful to your heart. Tell your health care provider if you drink alcohol several times a week. ? Talk with your health care provider about whether any level of alcohol use is safe for you. ? If your heart has already been damaged by alcohol or you have severe heart failure, drinking alcohol should be stopped completely.  Stop use of illegal drugs.  Lose weight if directed by your health care provider. Weight loss may reduce symptoms of heart failure.  Do moderate physical activity if directed by your health care provider. People who are elderly and people with severe heart failure should consult with a health care provider for physical activity recommendations.   Monitor important information  Weigh yourself every day. Keeping track of your weight daily helps you to notice excess fluid sooner. ? Weigh yourself every morning after you urinate and before you eat breakfast. ? Wear the same amount of clothing each time you weigh yourself. ? Record your daily weight. Provide your health care provider with your weight record.  Monitor and record your blood pressure as told by your health  care provider.  Check your pulse as told by your health care provider. Dealing with extreme temperatures  If the weather is extremely hot: ? Avoid vigorous physical activity. ? Use air conditioning or fans or seek a cooler location. ? Avoid caffeine and alcohol. ? Wear loose-fitting, lightweight, and light-colored clothing.  If the weather is extremely cold: ? Avoid vigorous physical activity. ? Layer your clothes. ? Wear mittens or gloves, a hat, and a scarf when you go outside. ? Avoid alcohol. General instructions  Manage other health conditions such as hypertension, diabetes, thyroid disease, or abnormal heart rhythms as told by your health care provider.  Learn to manage stress. If you need help to do this, ask your health care provider.  Plan rest periods when fatigued.  Get ongoing education and support as needed.  Participate in or seek rehabilitation as needed to maintain or improve independence and quality of life.  Stay up to date with immunizations. Keeping current on pneumococcal and influenza immunizations is especially important to prevent respiratory infections.  Keep all follow-up visits as told by your health care provider. This is important. Contact a health care provider if:  You have a rapid weight gain.  You have increasing shortness of breath that is unusual for you.  You are unable to participate in your usual physical activities.  You tire easily.  You cough more than normal, especially with physical activity.  You have any swelling or more swelling in areas such as your hands, feet, ankles, or abdomen.  You are unable to sleep because it is hard to breathe.  You feel like your heart is beating quickly (palpitations).  You become dizzy or light-headed when you stand up. Get help right away if:  You have difficulty breathing.  You notice or your family notices a change in your awareness, such as having trouble staying awake or having  difficulty with concentration.  You have pain or discomfort in your chest.  You have an episode of fainting (syncope). This information is not intended to replace advice given to you by your health care provider. Make sure you discuss any questions you have with your health care provider. Document Released: 09/13/2005 Document Revised: 05/18/2016 Document Reviewed: 04/07/2016 Elsevier Interactive Patient Education  2018 [employer].   Sertraline tablets What is this medicine? SERTRALINE (SER tra leen) is used to treat depression. It may also be used to treat obsessive compulsive disorder, panic disorder, post-trauma stress, premenstrual dysphoric disorder (PMDD) or social anxiety. This medicine may be used for other purposes; ask your health care provider or pharmacist if you have questions. COMMON BRAND NAME(S): Zoloft What should I tell my health care provider before I take this medicine? They need to know if you have any of these conditions: -bleeding disorders -bipolar disorder or a family history of bipolar disorder -glaucoma -heart disease -high blood pressure -history of irregular heartbeat -history of low levels of calcium, magnesium, or potassium in the blood -if you often drink alcohol -liver disease -receiving electroconvulsive therapy -seizures -suicidal thoughts, plans, or attempt; a previous suicide  attempt by you or a family member -take medicines that treat or prevent blood clots -thyroid disease -an unusual or allergic reaction to sertraline, other medicines, foods, dyes, or preservatives -pregnant or trying to get pregnant -breast-feeding How should I use this medicine? Take this medicine by mouth with a glass of water. Follow the directions on the prescription label. You can take it with or without food. Take your medicine at regular intervals. Do not take your medicine more often than directed. Do not stop taking this medicine suddenly except upon the advice  of your doctor. Stopping this medicine too quickly may cause serious side effects or your condition may worsen. A special MedGuide will be given to you by the pharmacist with each prescription and refill. Be sure to read this information carefully each time. Talk to your pediatrician regarding the use of this medicine in children. While this drug may be prescribed for children as young as 7 years for selected conditions, precautions do apply. Overdosage: If you think you have taken too much of this medicine contact a poison control center or emergency room at once. NOTE: This medicine is only for you. Do not share this medicine with others. What if I miss a dose? If you miss a dose, take it as soon as you can. If it is almost time for your next dose, take only that dose. Do not take double or extra doses. What may interact with this medicine? Do not take this medicine with any of the following medications: -cisapride -dofetilide -dronedarone -linezolid -MAOIs like Carbex, Eldepryl, Marplan, Nardil, and Parnate -methylene blue (injected into a vein) -pimozide -thioridazine This medicine may also interact with the following medications: -alcohol -amphetamines -aspirin and aspirin-like medicines -certain medicines for depression, anxiety, or psychotic disturbances -certain medicines for fungal infections like ketoconazole, fluconazole, posaconazole, and itraconazole -certain medicines for irregular heart beat like flecainide, quinidine, propafenone -certain medicines for migraine headaches like almotriptan, eletriptan, frovatriptan, naratriptan, rizatriptan, sumatriptan, zolmitriptan -certain medicines for sleep -certain medicines for seizures like carbamazepine, valproic acid, phenytoin -certain medicines that treat or prevent blood clots like warfarin, enoxaparin, dalteparin -cimetidine -digoxin -diuretics -fentanyl -isoniazid -lithium -NSAIDs, medicines for pain and inflammation,  like ibuprofen or naproxen -other medicines that prolong the QT interval (cause an abnormal heart rhythm) -rasagiline -safinamide -supplements like St. John's wort, kava kava, valerian -tolbutamide -tramadol -tryptophan This list may not describe all possible interactions. Give your health care provider a list of all the medicines, herbs, non-prescription drugs, or dietary supplements you use. Also tell them if you smoke, drink alcohol, or use illegal drugs. Some items may interact with your medicine. What should I watch for while using this medicine? Tell your doctor if your symptoms do not get better or if they get worse. Visit your doctor or health care professional for regular checks on your progress. Because it may take several weeks to see the full effects of this medicine, it is important to continue your treatment as prescribed by your doctor. Patients and their families should watch out for new or worsening thoughts of suicide or depression. Also watch out for sudden changes in feelings such as feeling anxious, agitated, panicky, irritable, hostile, aggressive, impulsive, severely restless, overly excited and hyperactive, or not being able to sleep. If this happens, especially at the beginning of treatment or after a change in dose, call your health care professional. Dennis Bast may get drowsy or dizzy. Do not drive, use machinery, or do anything that needs mental alertness until you know  how this medicine affects you. Do not stand or sit up quickly, especially if you are an older patient. This reduces the risk of dizzy or fainting spells. Alcohol may interfere with the effect of this medicine. Avoid alcoholic drinks. Your mouth may get dry. Chewing sugarless gum or sucking hard candy, and drinking plenty of water may help. Contact your doctor if the problem does not go away or is severe. What side effects may I notice from receiving this medicine? Side effects that you should report to your doctor  or health care professional as soon as possible: -allergic reactions like skin rash, itching or hives, swelling of the face, lips, or tongue -anxious -black, tarry stools -changes in vision -confusion -elevated mood, decreased need for sleep, racing thoughts, impulsive behavior -eye pain -fast, irregular heartbeat -feeling faint or lightheaded, falls -feeling agitated, angry, or irritable -hallucination, loss of contact with reality -loss of balance or coordination -loss of memory -painful or prolonged erections -restlessness, pacing, inability to keep still -seizures -stiff muscles -suicidal thoughts or other mood changes -trouble sleeping -unusual bleeding or bruising -unusually weak or tired -vomiting Side effects that usually do not require medical attention (report to your doctor or health care professional if they continue or are bothersome): -change in appetite or weight -change in sex drive or performance -diarrhea -increased sweating -indigestion, nausea -tremors This list may not describe all possible side effects. Call your doctor for medical advice about side effects. You may report side effects to FDA at 1-800-FDA-1088. Where should I keep my medicine? Keep out of the reach of children. Store at room temperature between 15 and 30 degrees C (59 and 86 degrees F). Throw away any unused medicine after the expiration date. NOTE: This sheet is a summary. It may not cover all possible information. If you have questions about this medicine, talk to your doctor, pharmacist, or health care provider.  2018 Elsevier/Gold Standard (2016-09-17 14:17:49)

## 2018-06-20 LAB — BASIC METABOLIC PANEL
BUN/Creatinine Ratio: 18 (ref 10–24)
BUN: 29 mg/dL — ABNORMAL HIGH (ref 8–27)
CALCIUM: 9.7 mg/dL (ref 8.6–10.2)
CO2: 20 mmol/L (ref 20–29)
Chloride: 101 mmol/L (ref 96–106)
Creatinine, Ser: 1.65 mg/dL — ABNORMAL HIGH (ref 0.76–1.27)
GFR calc non Af Amer: 41 mL/min/{1.73_m2} — ABNORMAL LOW (ref >59)
GFR, EST AFRICAN AMERICAN: 48 mL/min/{1.73_m2} — AB (ref >59)
Glucose: 189 mg/dL — ABNORMAL HIGH (ref 65–99)
POTASSIUM: 5.1 mmol/L (ref 3.5–5.2)
Sodium: 137 mmol/L (ref 134–144)

## 2018-06-20 LAB — POCT GLYCOSYLATED HEMOGLOBIN (HGB A1C): Hemoglobin A1C: 7.4 % — AB (ref 4.0–5.6)

## 2018-06-20 LAB — CBC WITH DIFFERENTIAL/PLATELET
BASOS: 1 %
Basophils Absolute: 0 10*3/uL (ref 0.0–0.2)
EOS (ABSOLUTE): 0.5 10*3/uL — ABNORMAL HIGH (ref 0.0–0.4)
EOS: 7 %
HEMOGLOBIN: 12.2 g/dL — AB (ref 13.0–17.7)
Hematocrit: 37 % — ABNORMAL LOW (ref 37.5–51.0)
IMMATURE GRANS (ABS): 0 10*3/uL (ref 0.0–0.1)
IMMATURE GRANULOCYTES: 1 %
LYMPHS: 27 %
Lymphocytes Absolute: 1.8 10*3/uL (ref 0.7–3.1)
MCH: 29.4 pg (ref 26.6–33.0)
MCHC: 33 g/dL (ref 31.5–35.7)
MCV: 89 fL (ref 79–97)
MONOCYTES: 8 %
Monocytes Absolute: 0.5 10*3/uL (ref 0.1–0.9)
NEUTROS PCT: 56 %
Neutrophils Absolute: 3.6 10*3/uL (ref 1.4–7.0)
Platelets: 184 10*3/uL (ref 150–450)
RBC: 4.15 x10E6/uL (ref 4.14–5.80)
RDW: 13.2 % (ref 12.3–15.4)
WBC: 6.5 10*3/uL (ref 3.4–10.8)

## 2018-06-20 LAB — BRAIN NATRIURETIC PEPTIDE: BNP: 146.4 pg/mL — ABNORMAL HIGH (ref 0.0–100.0)

## 2018-06-21 ENCOUNTER — Telehealth: Payer: Self-pay | Admitting: Physician Assistant

## 2018-06-21 NOTE — Telephone Encounter (Signed)
Called [facility] to add additional test

## 2018-06-21 NOTE — Telephone Encounter (Signed)
Please call to notify patient that kidney function is stable, lab for heart failure is ok (no exacerbations). However hemoglobin has decreased since last check just one month ago. I would like to add on an iron panel to see if it is iron def. I will f/u pending those results.

## 2018-06-21 NOTE — Telephone Encounter (Signed)
Patient advised as below. Patient verbalizes understanding and is in agreement with treatment plan.  

## 2018-06-23 ENCOUNTER — Ambulatory Visit (INDEPENDENT_AMBULATORY_CARE_PROVIDER_SITE_OTHER): Payer: Medicare Other | Admitting: Vascular Surgery

## 2018-06-23 ENCOUNTER — Encounter (INDEPENDENT_AMBULATORY_CARE_PROVIDER_SITE_OTHER): Payer: Medicare Other

## 2018-06-23 ENCOUNTER — Telehealth: Payer: Self-pay

## 2018-06-23 ENCOUNTER — Encounter

## 2018-06-23 NOTE — Telephone Encounter (Signed)
-----   Message from Mar Daring, PA-C sent at 06/22/2018  1:53 PM EDT ----- Iron stores appear to be ok. I would recommend to have CBC checked in 4 weeks to see if still decreasing. How is his cough and SOB?

## 2018-06-23 NOTE — Telephone Encounter (Signed)
Patient advised, he states that cough and shortness of breath has improved. KW

## 2018-06-24 LAB — IRON AND TIBC
IRON SATURATION: 23 % (ref 15–55)
Iron: 79 ug/dL (ref 38–169)
Total Iron Binding Capacity: 344 ug/dL (ref 250–450)
UIBC: 265 ug/dL (ref 111–343)

## 2018-06-24 LAB — SPECIMEN STATUS REPORT

## 2018-07-12 ENCOUNTER — Ambulatory Visit: Payer: Medicare Other | Admitting: Family Medicine

## 2018-07-12 ENCOUNTER — Encounter: Payer: Self-pay | Admitting: Family Medicine

## 2018-07-12 VITALS — BP 118/64 | HR 59 | Temp 97.4°F | Resp 16 | Wt 231.8 lb

## 2018-07-12 DIAGNOSIS — H938X2 Other specified disorders of left ear: Secondary | ICD-10-CM | POA: Diagnosis not present

## 2018-07-12 MED ORDER — MUPIROCIN CALCIUM 2 % EX CREA: 1.0000 "application " | TOPICAL_CREAM | Freq: Three times a day (TID) | CUTANEOUS | 0 refills | Status: DC

## 2018-07-12 NOTE — Patient Instructions (Signed)
Call me if ear not improving in the next week for dermatology referral.

## 2018-07-12 NOTE — Progress Notes (Signed)
  Subjective:     Patient ID: Tom Moore, male   DOB: 1945-11-15, 72 y.o.   MRN: [id number] Chief Complaint  Patient presents with  . Ear Pain    Patient comes in office today with concerns of a sore on his left ear lobe and tenderness inside ear canal for the past 2 weeks.    HPI Denies injury or jewelry use in this area.  Review of Systems     Objective:   Physical Exam  Constitutional: He appears well-developed and well-nourished. No distress.  HENT:  Left Ear: External ear normal.  Skin:  Left earl lobe with annular area of erythema without drainage, crusting, or vesicles.       Assessment:    1. Irritation of left ear: try mupirocin ointment.    Plan:    To call in 1-2 weeks if no improvement for referral to dermatology for probable bx.

## 2018-07-17 ENCOUNTER — Ambulatory Visit: Payer: Self-pay | Admitting: Physician Assistant

## 2018-07-18 ENCOUNTER — Ambulatory Visit: Payer: Medicare Other | Admitting: Family Medicine

## 2018-07-18 ENCOUNTER — Encounter: Payer: Self-pay | Admitting: Family Medicine

## 2018-07-18 VITALS — BP 100/64 | HR 90 | Temp 98.2°F | Resp 16 | Wt 225.6 lb

## 2018-07-18 DIAGNOSIS — B349 Viral infection, unspecified: Secondary | ICD-10-CM | POA: Diagnosis not present

## 2018-07-18 MED ORDER — HYDROCODONE-HOMATROPINE 5-1.5 MG/5ML PO SYRP: 5.0000 mL | ORAL_SOLUTION | Freq: Four times a day (QID) | ORAL | 0 refills | Status: DC | PRN

## 2018-07-18 NOTE — Patient Instructions (Signed)
Try Mucinex for chest congestion and Delsym for cough. Salt water irrigation for your sinuses. Use prescription cough syrup to help you sleep.

## 2018-07-18 NOTE — Progress Notes (Signed)
  Subjective:     Patient ID: Tom Moore, male   DOB: Mar 21, 1946, 72 y.o.   MRN: [id number] Chief Complaint  Patient presents with  . Cough    Patient comes in office today with complaints of cough and chest congestion for the past 4 days. Patient reports pain in his shoulders, fatigue and pressure around head due to symptoms.   HPI Reports sinus congestion and body aches but no fever. Cough is not productive. Review of Systems  Skin:       Left ear lobe irritation appears to be healing well with abx ointment.       Objective:   Physical Exam  Constitutional: He appears well-developed and well-nourished. No distress.  Ears: T.M's intact without inflammatio Throat: tonsils absent Neck: no cervical adenopathy Lungs: clear     Assessment:    1. Acute viral syndrome - HYDROcodone-homatropine (HYCODAN) 5-1.5 MG/5ML syrup; Take 5 mLs by mouth every 6 (six) hours as needed for up to 5 days. 5 ml 4-6 hours as needed for cough  Dispense: 100 mL; Refill: 0        PLan: Saline irrigation and expectorants (samples provided). May also use Delsym for cough.

## 2018-07-25 LAB — HM DIABETES EYE EXAM

## 2018-07-27 ENCOUNTER — Encounter: Payer: Self-pay | Admitting: Family Medicine

## 2018-08-07 ENCOUNTER — Other Ambulatory Visit: Payer: Self-pay | Admitting: Physician Assistant

## 2018-08-07 ENCOUNTER — Other Ambulatory Visit: Payer: Self-pay | Admitting: Family Medicine

## 2018-08-07 DIAGNOSIS — F339 Major depressive disorder, recurrent, unspecified: Secondary | ICD-10-CM

## 2018-08-08 ENCOUNTER — Other Ambulatory Visit: Payer: Self-pay | Admitting: Family Medicine

## 2018-08-08 DIAGNOSIS — F419 Anxiety disorder, unspecified: Secondary | ICD-10-CM

## 2018-08-08 MED ORDER — ALPRAZOLAM 0.5 MG PO TBDP: 0.5000 mg | ORAL_TABLET | Freq: Two times a day (BID) | ORAL | 0 refills | Status: DC | PRN

## 2018-08-08 MED ORDER — ALPRAZOLAM 0.5 MG PO TABS: 0.5000 mg | ORAL_TABLET | Freq: Three times a day (TID) | ORAL | 0 refills | Status: DC | PRN

## 2018-08-14 ENCOUNTER — Other Ambulatory Visit: Payer: Self-pay | Admitting: Family Medicine

## 2018-08-14 ENCOUNTER — Ambulatory Visit: Payer: Medicare Other | Admitting: Urology

## 2018-08-14 MED ORDER — OXYBUTYNIN CHLORIDE 5 MG PO TABS: ORAL_TABLET | ORAL | 0 refills | Status: DC

## 2018-08-17 ENCOUNTER — Other Ambulatory Visit: Payer: Self-pay | Admitting: Family Medicine

## 2018-08-22 ENCOUNTER — Ambulatory Visit (INDEPENDENT_AMBULATORY_CARE_PROVIDER_SITE_OTHER): Payer: Medicare Other | Admitting: Urology

## 2018-08-22 ENCOUNTER — Encounter: Payer: Self-pay | Admitting: Urology

## 2018-08-22 VITALS — BP 114/69 | HR 91 | Ht 69.0 in | Wt 232.5 lb

## 2018-08-22 DIAGNOSIS — R351 Nocturia: Secondary | ICD-10-CM

## 2018-08-22 DIAGNOSIS — N5201 Erectile dysfunction due to arterial insufficiency: Secondary | ICD-10-CM | POA: Diagnosis not present

## 2018-08-22 NOTE — Progress Notes (Signed)
08/22/2018 5:17 PM   Tom Moore 07/22/1946 [id number]  Referring provider: Carmon Ginsberg, [place], [place] [address]  Chief Complaint  Patient presents with  . Follow-up  . Nocturia    HPI: 72 year old male presents for follow-up of nocturia.  He was initially seen November 2018 with nocturia x4-5.  He had marked improvement in his symptoms on Nocdurna however it was too expensive and insurance would not cover.  He is currently taking a nighttime dose of immediate release oxybutynin and states this has worked well.  His nocturia is currently 1 time per night and not bothersome.  He also wanted to discuss ED today.  He has difficulty achieving an erection and states he has partial erections which are not firm enough for penetration.  He does have significant organic risk factors including diabetes, hyperlipidemia, hypertension, congestive heart failure and medications including diltiazem, lisinopril, metoprolol and Effexor.   PMH: Past Medical History:  Diagnosis Date  . Anxiety   . Arthritis   . Depression   . Diabetes mellitus without complication ([place])   . Dysrhythmia    atrial fib  . GERD (gastroesophageal reflux disease)   . Hyperlipidemia   . Hypertension   . Hyperthyroidism   . Insomnia   . Sleep apnea    unable to tolerater CPAP    Surgical History: Past Surgical History:  Procedure Laterality Date  . CARPAL TUNNEL RELEASE Right 04/10/2018   Procedure: RELEASE MEDIAN NERVE AT CARPAL TUNNEL;  Surgeon: Earnestine Leys, MD;  Location: [facility] ORS;  Service: Orthopedics;  Laterality: Right;  . CARPAL TUNNEL RELEASE Left 05/08/2018   Procedure: CARPAL TUNNEL RELEASE;  Surgeon: Earnestine Leys, MD;  Location: [facility] ORS;  Service: Orthopedics;  Laterality: Left;  . TONSILLECTOMY      Home Medications:  Allergies as of 08/22/2018      Reactions   Zyrtec Allergy [cetirizine] Diarrhea      Medication List        Accurate as of  08/22/18  5:17 PM. Always use your most recent med list.          ALPRAZolam 0.5 MG tablet Commonly known as:  XANAX Take 1 tablet (0.5 mg total) by mouth 3 (three) times daily as needed for anxiety.   apixaban 5 MG Tabs tablet Commonly known as:  ELIQUIS Take 5 mg by mouth 2 (two) times daily.   atorvastatin 40 MG tablet Commonly known as:  LIPITOR Take 40 mg by mouth daily.   diltiazem 120 MG 24 hr capsule Commonly known as:  CARDIZEM CD Take 120 mg by mouth daily.   furosemide 40 MG tablet Commonly known as:  LASIX Take 1 tablet (40 mg total) by mouth 2 (two) times daily.   gabapentin 400 MG capsule Commonly known as:  NEURONTIN Take 1 capsule (400 mg total) by mouth 2 (two) times daily.   glipiZIDE 10 MG tablet Commonly known as:  GLUCOTROL TAKE ONE TABLET BY MOUTH THREE TIMES DAILY BEFORE MEALS   insulin NPH Human 100 UNIT/ML injection Commonly known as:  HUMULIN N,NOVOLIN N Take 15 units BID 12 hours apart (8 am, 8 pm approximately) *Relion brand only please*   lisinopril 20 MG tablet Commonly known as:  PRINIVIL,ZESTRIL Take 40 mg by mouth daily.   Magnesium Oxide 400 MG Caps Take 1 capsule (400 mg total) by mouth 2 (two) times daily.   metFORMIN 1000 MG tablet Commonly known as:  GLUCOPHAGE TAKE 1 TABLET BY MOUTH  TWICE DAILY WITH A MEAL   methimazole 5 MG tablet Commonly known as:  TAPAZOLE Take 5 mg by mouth.   metoprolol succinate 100 MG 24 hr tablet Commonly known as:  TOPROL-XL Take 200 mg by mouth daily.   multivitamin-iron-minerals-folic acid Tabs tablet Take 1 tablet by mouth daily.   mupirocin cream 2 % Commonly known as:  BACTROBAN Apply 1 application topically 3 (three) times daily.   omeprazole 40 MG capsule Commonly known as:  PRILOSEC TAKE 1 CAPSULE(40 MG) BY MOUTH DAILY   omeprazole 40 MG capsule Commonly known as:  PRILOSEC TAKE 1 CAPSULE BY MOUTH ONCE DAILY   ONE TOUCH ULTRA TEST test strip Generic drug:  glucose  blood Use 3 (three) times daily One touch ultra test strips e11.29   oxybutynin 5 MG tablet Commonly known as:  DITROPAN 1 tablet at bedtime   sertraline 50 MG tablet Commonly known as:  ZOLOFT TAKE 1 TABLET BY MOUTH AT BEDTIME   spironolactone 25 MG tablet Commonly known as:  ALDACTONE TAKE 1 TABLET BY MOUTH EVERY DAY   tamsulosin 0.4 MG Caps capsule Commonly known as:  FLOMAX TAKE 1 CAPSULE BY MOUTH ONCE DAILY   triamcinolone cream 0.1 % Commonly known as:  KENALOG Apply to left ear rash 2-3 x day as needed for itching.   venlafaxine XR 150 MG 24 hr capsule Commonly known as:  EFFEXOR-XR TAKE 1 CAPSULE BY MOUTH ONCE DAILY WITH BREAKFAST (TAKE  WITH  75MG   FOR  A  TOTAL  DAILY  DOSE  OF  225  MG)       Allergies:  Allergies  Allergen Reactions  . Zyrtec Allergy [Cetirizine] Diarrhea    Family History: Family History  Problem Relation Age of Onset  . Hypertension Mother   . Heart disease Mother     Social History:  reports that he quit smoking about 30 years ago. He has never used smokeless tobacco. He reports that he drinks alcohol. He reports that he does not use drugs.  ROS: UROLOGY Frequent Urination?: Yes Hard to postpone urination?: No Burning/pain with urination?: No Get up at night to urinate?: Yes Leakage of urine?: No Urine stream starts and stops?: Yes Trouble starting stream?: No Do you have to strain to urinate?: No Blood in urine?: No Urinary tract infection?: No Sexually transmitted disease?: No Injury to kidneys or bladder?: No Painful intercourse?: No Weak stream?: No Erection problems?: Yes Penile pain?: No  Gastrointestinal Nausea?: No Vomiting?: No Indigestion/heartburn?: No Diarrhea?: No Constipation?: No  Constitutional Fever: No Night sweats?: No Weight loss?: No Fatigue?: No  Skin Skin rash/lesions?: No Itching?: No  Eyes Blurred vision?: No Double vision?: No  Ears/Nose/Throat Sinus problems?:  No  Hematologic/Lymphatic Swollen glands?: No Easy bruising?: No  Cardiovascular Leg swelling?: No Chest pain?: No  Respiratory Cough?: No Shortness of breath?: No  Endocrine Excessive thirst?: No  Musculoskeletal Back pain?: No Joint pain?: No  Neurological Headaches?: No Dizziness?: No  Psychologic Depression?: No Anxiety?: Yes  Physical Exam: BP 114/69 (BP Location: Left Arm, Patient Position: Sitting, Cuff Size: Normal)   Pulse 91   Ht 5\' 9"  (1.753 m)   Wt 232 lb 8 oz (105.5 kg)   BMI 34.33 kg/m   Constitutional:  Alert and oriented, No acute distress. HEENT: [place] AT, moist mucus membranes.  Trachea midline, no masses. Cardiovascular: No clubbing, cyanosis, or edema. Respiratory: Normal respiratory effort, no increased work of breathing. GI: Abdomen is soft, nontender, nondistended, no abdominal masses  GU: No CVA tenderness Lymph: No cervical or inguinal lymphadenopathy. Skin: No rashes, bruises or suspicious lesions. Neurologic: Grossly intact, no focal deficits, moving all 4 extremities. Psychiatric: Normal mood and affect.   Assessment & Plan:   72 year old male with stable nocturia on a nighttime dose of immediate release oxybutynin.  His oxybutynin was refilled.  He also inquired about ED treatments.  Will check with his cardiologist to make sure there is no contraindication regarding a PDE 5 inhibitor and if he was healthy enough for the exertion of sexual activity.  Return in about 1 year (around 08/23/2019) for Recheck.   Abbie Sons, [place] [address] [place], [place] [address] [phone]

## 2018-08-23 ENCOUNTER — Encounter: Payer: Self-pay | Admitting: Urology

## 2018-08-28 ENCOUNTER — Encounter: Payer: Self-pay | Admitting: Family Medicine

## 2018-08-28 ENCOUNTER — Other Ambulatory Visit: Payer: Self-pay

## 2018-08-28 ENCOUNTER — Ambulatory Visit: Payer: Medicare Other | Admitting: Family Medicine

## 2018-08-28 VITALS — BP 128/62 | HR 42 | Temp 97.7°F | Ht 69.0 in | Wt 233.4 lb

## 2018-08-28 DIAGNOSIS — J069 Acute upper respiratory infection, unspecified: Secondary | ICD-10-CM | POA: Diagnosis not present

## 2018-08-28 NOTE — Patient Instructions (Signed)
Use salt water irrigation, Mucinex DM and left over cough syrup. Let me know if sinuses not improving by the end of the week.

## 2018-08-28 NOTE — Progress Notes (Signed)
  Subjective:     Patient ID: Tom Moore, male   DOB: 1946/08/23, 72 y.o.   MRN: [id number] Chief Complaint  Patient presents with  . chest cold    congestion with green mucus, cough, sinus pressure started saturday11/30/19   HPI Has been taking left over cough syrup for his sx  Review of Systems     Objective:   Physical Exam  Constitutional: He appears well-developed and well-nourished. No distress.  Ears: T.M's intact without inflammation Throat: tonsils absent Neck: no cervical adenopathy Lungs: clear     Assessment:    1. URI, acute     Plan:    Samples of Mucinex DM and saline irrigation. He is to call if sinuses not improving over the course of the week.

## 2018-08-30 ENCOUNTER — Telehealth: Payer: Self-pay | Admitting: Family Medicine

## 2018-08-30 DIAGNOSIS — B349 Viral infection, unspecified: Secondary | ICD-10-CM

## 2018-08-30 MED ORDER — HYDROCODONE-HOMATROPINE 5-1.5 MG/5ML PO SYRP: 5.0000 mL | ORAL_SOLUTION | Freq: Four times a day (QID) | ORAL | 0 refills | Status: AC | PRN

## 2018-08-30 NOTE — Telephone Encounter (Signed)
Pt needing a call back to discuss some medication he and Mikki Santee talked about. Please call pt back.  Thanks, [employer]

## 2018-08-30 NOTE — Telephone Encounter (Signed)
Have sent prescription to [facility] 

## 2018-08-30 NOTE — Telephone Encounter (Signed)
Patient called office with complaint of cough, patient states that he was seen by Mikki Santee on 08/28/18 for URI and states that he had been taking left over Hycodan that he previously prescribed for cough. Patient states that he let Mikki Santee know this at time of visit ( see last office note), he states that he finished the prescription but cough is still present and wants to know if Hycodan can be filled again? Medication was previously prescribed back on 07/18/18, I advised patient that Mikki Santee is out of office this afternoon and will address when he returns but patient is asking that it be filled this evening. Please review chart and advise. KW

## 2018-08-31 NOTE — Telephone Encounter (Signed)
Patient notified that prescription has been sent in. Tom Moore

## 2018-09-05 ENCOUNTER — Telehealth: Payer: Self-pay

## 2018-09-05 ENCOUNTER — Other Ambulatory Visit: Payer: Self-pay | Admitting: Family Medicine

## 2018-09-05 MED ORDER — AMOXICILLIN-POT CLAVULANATE 875-125 MG PO TABS: 1.0000 | ORAL_TABLET | Freq: Two times a day (BID) | ORAL | 0 refills | Status: DC

## 2018-09-05 NOTE — Telephone Encounter (Signed)
Pt called reporting that he saw you recently for chest cold and congestion and he is still having a lot of congestion in his chest and nose and asking if he may need more medication that will work.better.  You only gave him cough medication.   He uses [facility] [address].  Call back # [phone].

## 2018-09-05 NOTE — Telephone Encounter (Signed)
I have sent in Augmentin to [facility]

## 2018-09-05 NOTE — Telephone Encounter (Signed)
Pt advised of abx sent to pharmacy.  dbs

## 2018-09-11 ENCOUNTER — Other Ambulatory Visit: Payer: Self-pay

## 2018-09-11 MED ORDER — OXYBUTYNIN CHLORIDE 5 MG PO TABS: ORAL_TABLET | ORAL | 11 refills | Status: DC

## 2018-09-16 ENCOUNTER — Telehealth: Payer: Self-pay | Admitting: Urology

## 2018-09-16 NOTE — Telephone Encounter (Signed)
Yolonda Kida, MD  Abbie Sons, MD        I see no contraindications . Dwayne   Previous Messages    ----- Message -----  From: Abbie Sons, MD  Sent: 08/23/2018  8:10 AM EST  To: Yolonda Kida, MD   Hi Dwayne,   I saw this patient for routine follow-up yesterday and he requested treatment for ED. Any contraindication to PDE 5 inhibitors or exertion of sexual activity in this patient in your opinion?   Thanks,  [employer]

## 2018-09-28 DIAGNOSIS — Z794 Long term (current) use of insulin: Secondary | ICD-10-CM | POA: Diagnosis not present

## 2018-09-28 DIAGNOSIS — E0590 Thyrotoxicosis, unspecified without thyrotoxic crisis or storm: Secondary | ICD-10-CM | POA: Diagnosis not present

## 2018-09-28 DIAGNOSIS — E1122 Type 2 diabetes mellitus with diabetic chronic kidney disease: Secondary | ICD-10-CM | POA: Diagnosis not present

## 2018-09-28 DIAGNOSIS — N183 Chronic kidney disease, stage 3 (moderate): Secondary | ICD-10-CM | POA: Diagnosis not present

## 2018-10-03 ENCOUNTER — Other Ambulatory Visit: Payer: Self-pay | Admitting: Physician Assistant

## 2018-10-03 DIAGNOSIS — F339 Major depressive disorder, recurrent, unspecified: Secondary | ICD-10-CM

## 2018-10-05 DIAGNOSIS — Z794 Long term (current) use of insulin: Secondary | ICD-10-CM | POA: Diagnosis not present

## 2018-10-05 DIAGNOSIS — E1122 Type 2 diabetes mellitus with diabetic chronic kidney disease: Secondary | ICD-10-CM | POA: Diagnosis not present

## 2018-10-05 DIAGNOSIS — E0590 Thyrotoxicosis, unspecified without thyrotoxic crisis or storm: Secondary | ICD-10-CM | POA: Diagnosis not present

## 2018-10-05 DIAGNOSIS — N183 Chronic kidney disease, stage 3 (moderate): Secondary | ICD-10-CM | POA: Diagnosis not present

## 2018-10-12 ENCOUNTER — Other Ambulatory Visit: Payer: Self-pay

## 2018-10-12 ENCOUNTER — Encounter: Payer: Self-pay | Admitting: Family Medicine

## 2018-10-12 ENCOUNTER — Ambulatory Visit (INDEPENDENT_AMBULATORY_CARE_PROVIDER_SITE_OTHER): Payer: PPO | Admitting: Family Medicine

## 2018-10-12 VITALS — BP 130/78 | HR 70 | Temp 97.9°F | Ht 69.0 in | Wt 237.0 lb

## 2018-10-12 DIAGNOSIS — J40 Bronchitis, not specified as acute or chronic: Secondary | ICD-10-CM | POA: Diagnosis not present

## 2018-10-12 DIAGNOSIS — F32A Depression, unspecified: Secondary | ICD-10-CM

## 2018-10-12 DIAGNOSIS — F329 Major depressive disorder, single episode, unspecified: Secondary | ICD-10-CM | POA: Diagnosis not present

## 2018-10-12 DIAGNOSIS — N529 Male erectile dysfunction, unspecified: Secondary | ICD-10-CM | POA: Diagnosis not present

## 2018-10-12 MED ORDER — TADALAFIL 20 MG PO TABS: 20.0000 mg | ORAL_TABLET | Freq: Every day | ORAL | 0 refills | Status: DC | PRN

## 2018-10-12 MED ORDER — SERTRALINE HCL 100 MG PO TABS: 100.0000 mg | ORAL_TABLET | Freq: Every day | ORAL | 3 refills | Status: DC

## 2018-10-12 MED ORDER — DOXYCYCLINE HYCLATE 100 MG PO TABS: 100.0000 mg | ORAL_TABLET | Freq: Two times a day (BID) | ORAL | 0 refills | Status: DC

## 2018-10-12 MED ORDER — HYDROCODONE-HOMATROPINE 5-1.5 MG/5ML PO SYRP: ORAL_SOLUTION | ORAL | 0 refills | Status: DC

## 2018-10-12 NOTE — Progress Notes (Signed)
  Subjective:     Patient ID: Tom Moore, male   DOB: 12-31-1945, 73 y.o.   MRN: [id number] Chief Complaint  Patient presents with  . Cough    since 09/28/18 dry cough not productive   HPI States he does not feel particularly sick. No sinus congestion or sore throat but states his tongue is sore (like he burned it with coffee) Reflux controlled with medication. Remains on an ACE inhibitor. Also states he is lacking in motivation-"I watch tv all day"-and depression not controlled with medication. States he is dating a new person and wishes to start Cialis again.  Review of Systems     Objective:   Physical Exam Constitutional:      General: He is not in acute distress.    Appearance: He is not ill-appearing.  HENT:     Mouth/Throat:     Comments: Tongue without lesions  Pulmonary:     Effort: No respiratory distress.     Breath sounds: Normal breath sounds.  Neurological:     Mental Status: He is alert.  Psychiatric:        Mood and Affect: Mood normal.        Behavior: Behavior normal.        Assessment:    1. Depression, unspecified depression type - sertraline (ZOLOFT) 100 MG tablet; Take 1 tablet (100 mg total) by mouth daily.  Dispense: 30 tablet; Refill: 3  2. Bronchitis: oximetry at 92% - doxycycline (VIBRA-TABS) 100 MG tablet; Take 1 tablet (100 mg total) by mouth 2 (two) times daily.  Dispense: 20 tablet; Refill: 0 - HYDROcodone-homatropine (HYCODAN) 5-1.5 MG/5ML syrup; 5 ml 6 hours as needed for cough  Dispense: 100 mL; Refill: 0  3. Erectile dysfunction, unspecified erectile dysfunction type - tadalafil (ADCIRCA/CIALIS) 20 MG tablet; Take 1 tablet (20 mg total) by mouth daily as needed for erectile dysfunction.  Dispense: 10 tablet; Refill: 0    Plan:    Follow up in two weeks. Consider trial off ACE inhibitor.

## 2018-10-12 NOTE — Patient Instructions (Signed)
We will follow up with you in two weeks. If new symptoms let me know before your office visit.

## 2018-10-26 ENCOUNTER — Encounter: Payer: Self-pay | Admitting: Family Medicine

## 2018-10-26 ENCOUNTER — Ambulatory Visit (INDEPENDENT_AMBULATORY_CARE_PROVIDER_SITE_OTHER): Payer: PPO | Admitting: Family Medicine

## 2018-10-26 VITALS — BP 112/70 | HR 76 | Temp 97.6°F | Resp 16 | Wt 230.8 lb

## 2018-10-26 DIAGNOSIS — F329 Major depressive disorder, single episode, unspecified: Secondary | ICD-10-CM

## 2018-10-26 DIAGNOSIS — F32A Depression, unspecified: Secondary | ICD-10-CM

## 2018-10-26 MED ORDER — SERTRALINE HCL 100 MG PO TABS: 100.0000 mg | ORAL_TABLET | Freq: Every day | ORAL | 1 refills | Status: DC

## 2018-10-26 NOTE — Patient Instructions (Addendum)
Continue sertraline at current dose. Do schedule a f/u with Dr. Rosanna Randy in a month or two to get re established.

## 2018-10-26 NOTE — Progress Notes (Signed)
  Subjective:     Patient ID: Tom Moore, male   DOB: [birth year], 73 y.o.   MRN: [id number] Chief Complaint  Patient presents with  . Bronchitis    Patient returns to office today for two week follow up, at last office visit o2 was 92%. Patient was started on Doxcycline 100mg  and Hycodan. Patient reports good compliance on medication and states that cough has cleared.  . Depression    Patient returns for 2 week follow up, at last visit we increased Sertraline to 100mg . Patient reports good compliance and symptom control.    HPI He is accompanied today by a friend, Tom Moore.  Review of Systems     Objective:   Physical Exam Constitutional:      General: He is not in acute distress.    Appearance: He is not ill-appearing.  Pulmonary:     Effort: Pulmonary effort is normal.     Breath sounds: Normal breath sounds. No wheezing.  Neurological:     Mental Status: He is alert.        Assessment:    1. Depression, unspecified depression type - sertraline (ZOLOFT) 100 MG tablet; Take 1 tablet (100 mg total) by mouth daily.  Dispense: 90 tablet; Refill: 1    Plan:    Will reestablish with Tom Moore due to my retirement.

## 2018-10-30 ENCOUNTER — Ambulatory Visit (INDEPENDENT_AMBULATORY_CARE_PROVIDER_SITE_OTHER): Payer: PPO | Admitting: Family Medicine

## 2018-10-30 ENCOUNTER — Encounter: Payer: Self-pay | Admitting: Family Medicine

## 2018-10-30 VITALS — BP 90/50 | HR 81 | Temp 98.0°F | Resp 16 | Wt 277.0 lb

## 2018-10-30 DIAGNOSIS — N183 Chronic kidney disease, stage 3 (moderate): Secondary | ICD-10-CM | POA: Diagnosis not present

## 2018-10-30 DIAGNOSIS — E1122 Type 2 diabetes mellitus with diabetic chronic kidney disease: Secondary | ICD-10-CM

## 2018-10-30 DIAGNOSIS — I5022 Chronic systolic (congestive) heart failure: Secondary | ICD-10-CM

## 2018-10-30 DIAGNOSIS — I480 Paroxysmal atrial fibrillation: Secondary | ICD-10-CM

## 2018-10-30 DIAGNOSIS — I10 Essential (primary) hypertension: Secondary | ICD-10-CM | POA: Diagnosis not present

## 2018-10-30 DIAGNOSIS — R42 Dizziness and giddiness: Secondary | ICD-10-CM

## 2018-10-30 NOTE — Progress Notes (Signed)
Patient: Tom Moore Male    DOB: 1945/11/14   73 y.o.   MRN: [id number] Visit Date: 10/30/2018  Today's Provider: Vernie Murders, PA   Chief Complaint  Patient presents with  . Dizziness   Subjective:     Dizziness  This is a new problem. The current episode started 1 to 4 weeks ago (2 weeks). The problem occurs intermittently. Associated symptoms include congestion, coughing, diaphoresis, fatigue, headaches, vertigo, a visual change and weakness. Pertinent negatives include no abdominal pain, anorexia, arthralgias, change in bowel habit, chest pain, chills, fever, joint swelling, myalgias, nausea, neck pain, numbness, rash, sore throat, swollen glands, urinary symptoms or vomiting. The symptoms are aggravated by bending, exertion and walking.     Patient states he has had intermittent dizziness for 2 weeks. Patient states when dizziness occurs he feels a vertigo sensation, off-balance, and have blurry vision. Dizziness can occur when he bends down, gets up out of a chair or while walking. Patient also has symptoms of sweating, headaches, weakness, cough, a choking sensation, and congestion.   Past Medical History:  Diagnosis Date  . Anxiety   . Arthritis   . Depression   . Diabetes mellitus without complication ([place])   . Dysrhythmia    atrial fib  . GERD (gastroesophageal reflux disease)   . Hyperlipidemia   . Hypertension   . Hyperthyroidism   . Insomnia   . Sleep apnea    unable to tolerater CPAP   Past Surgical History:  Procedure Laterality Date  . CARPAL TUNNEL RELEASE Right 04/10/2018   Procedure: RELEASE MEDIAN NERVE AT CARPAL TUNNEL;  Surgeon: Earnestine Leys, MD;  Location: [facility] ORS;  Service: Orthopedics;  Laterality: Right;  . CARPAL TUNNEL RELEASE Left 05/08/2018   Procedure: CARPAL TUNNEL RELEASE;  Surgeon: Earnestine Leys, MD;  Location: [facility] ORS;  Service: Orthopedics;  Laterality: Left;  . TONSILLECTOMY     Family History  Problem Relation Age of  Onset  . Hypertension Mother   . Heart disease Mother    Allergies  Allergen Reactions  . Zyrtec Allergy [Cetirizine] Diarrhea    Current Outpatient Medications:  .  ALPRAZolam (XANAX) 0.5 MG tablet, Take 1 tablet (0.5 mg total) by mouth 3 (three) times daily as needed for anxiety., Disp: 12 tablet, Rfl: 0 .  apixaban (ELIQUIS) 5 MG TABS tablet, Take 5 mg by mouth 2 (two) times daily. , Disp: , Rfl:  .  atorvastatin (LIPITOR) 40 MG tablet, Take 40 mg by mouth daily., Disp: , Rfl:  .  diltiazem (CARDIZEM CD) 120 MG 24 hr capsule, Take 120 mg by mouth daily. , Disp: , Rfl:  .  furosemide (LASIX) 40 MG tablet, Take 1 tablet (40 mg total) by mouth 2 (two) times daily., Disp: 180 tablet, Rfl: 1 .  gabapentin (NEURONTIN) 400 MG capsule, Take 1 capsule (400 mg total) by mouth 2 (two) times daily., Disp: 60 capsule, Rfl: 3 .  glipiZIDE (GLUCOTROL) 10 MG tablet, TAKE ONE TABLET BY MOUTH THREE TIMES DAILY BEFORE MEALS, Disp: 270 tablet, Rfl: 1 .  glucose blood (ONE TOUCH ULTRA TEST) test strip, Use 3 (three) times daily One touch ultra test strips e11.29, Disp: , Rfl:  .  insulin NPH Human (HUMULIN N,NOVOLIN N) 100 UNIT/ML injection, Take 15 units BID 12 hours apart (8 am, 8 pm approximately) *Relion brand only please*, Disp: , Rfl:  .  lisinopril (PRINIVIL,ZESTRIL) 20 MG tablet, Take 40 mg by mouth daily. , Disp: ,  Rfl: 3 .  Magnesium Oxide 400 MG CAPS, Take 1 capsule (400 mg total) by mouth 2 (two) times daily., Disp: 180 capsule, Rfl: 3 .  metFORMIN (GLUCOPHAGE) 1000 MG tablet, TAKE 1 TABLET BY MOUTH TWICE DAILY WITH A MEAL, Disp: 180 tablet, Rfl: 0 .  methimazole (TAPAZOLE) 5 MG tablet, Take 5 mg by mouth., Disp: , Rfl:  .  metoprolol succinate (TOPROL-XL) 100 MG 24 hr tablet, Take 200 mg by mouth daily. , Disp: , Rfl: 0 .  multivitamin-iron-minerals-folic acid (THERAPEUTIC-M) TABS tablet, Take 1 tablet by mouth daily., Disp: , Rfl:  .  omeprazole (PRILOSEC) 40 MG capsule, TAKE 1 CAPSULE(40 MG) BY  MOUTH DAILY, Disp: 90 capsule, Rfl: 0 .  oxybutynin (DITROPAN) 5 MG tablet, 1 tablet at bedtime, Disp: 30 tablet, Rfl: 11 .  sertraline (ZOLOFT) 100 MG tablet, Take 1 tablet (100 mg total) by mouth daily., Disp: 90 tablet, Rfl: 1 .  spironolactone (ALDACTONE) 25 MG tablet, TAKE 1 TABLET BY MOUTH EVERY DAY, Disp: , Rfl:  .  tamsulosin (FLOMAX) 0.4 MG CAPS capsule, TAKE 1 CAPSULE BY MOUTH ONCE DAILY, Disp: 90 capsule, Rfl: 1 .  venlafaxine XR (EFFEXOR-XR) 150 MG 24 hr capsule, TAKE 1 CAPSULE BY MOUTH ONCE DAILY WITH BREAKFAST (TAKE  WITH  75MG   FOR  A  TOTAL  DAILY  DOSE  OF  225  MG), Disp: 90 capsule, Rfl: 1 .  Dulaglutide 1.5 MG/0.5ML SOPN, Inject into the skin., Disp: , Rfl:  .  omeprazole (PRILOSEC) 40 MG capsule, TAKE 1 CAPSULE BY MOUTH ONCE DAILY (Patient not taking: Reported on 10/30/2018), Disp: 90 capsule, Rfl: 1 .  tadalafil (ADCIRCA/CIALIS) 20 MG tablet, Take 1 tablet (20 mg total) by mouth daily as needed for erectile dysfunction. (Patient not taking: Reported on 10/30/2018), Disp: 10 tablet, Rfl: 0 .  triamcinolone cream (KENALOG) 0.1 %, Apply to left ear rash 2-3 x day as needed for itching. (Patient not taking: Reported on 10/30/2018), Disp: 30 g, Rfl: 1  Review of Systems  Constitutional: Positive for diaphoresis and fatigue. Negative for appetite change, chills and fever.  HENT: Positive for congestion. Negative for sore throat.   Respiratory: Positive for cough and choking. Negative for chest tightness, shortness of breath and wheezing.   Cardiovascular: Negative for chest pain and palpitations.  Gastrointestinal: Negative for abdominal pain, anorexia, change in bowel habit, nausea and vomiting.  Musculoskeletal: Negative for arthralgias, joint swelling, myalgias and neck pain.  Skin: Negative for rash.  Neurological: Positive for dizziness, vertigo, weakness and headaches. Negative for numbness.   Social History   Tobacco Use  . Smoking status: Former Smoker    Last attempt to  quit: 04/03/1988    Years since quitting: 30.5  . Smokeless tobacco: Never Used  Substance Use Topics  . Alcohol use: Not Currently    Comment: Ocassional alcohol use, Drinks wine.     Objective:   BP (!) 90/50 (BP Location: Right Arm, Patient Position: Sitting, Cuff Size: Large)   Pulse 81   Temp 98 F (36.7 C) (Oral)   Resp 16   Wt 277 lb (125.6 kg)   SpO2 97%   BMI 40.91 kg/m  Vitals:   10/30/18 0959  BP: (!) 90/50  Pulse: 81  Resp: 16  Temp: 98 F (36.7 C)  TempSrc: Oral  SpO2: 97%  Weight: 277 lb (125.6 kg)   Physical Exam Constitutional:      General: He is not in acute distress.    Appearance:  Normal appearance. He is well-developed.  HENT:     Head: Normocephalic and atraumatic.     Right Ear: Hearing and tympanic membrane normal.     Left Ear: Hearing and tympanic membrane normal.     Nose: Nose normal. No congestion or rhinorrhea.     Mouth/Throat:     Pharynx: Oropharynx is clear. No posterior oropharyngeal erythema.  Eyes:     General: Lids are normal. No scleral icterus.       Right eye: No discharge.        Left eye: No discharge.     Conjunctiva/sclera: Conjunctivae normal.  Neck:     Musculoskeletal: Neck supple.  Cardiovascular:     Rate and Rhythm: Normal rate. Rhythm irregular.     Heart sounds: Normal heart sounds.  Pulmonary:     Effort: Pulmonary effort is normal. No respiratory distress.     Breath sounds: Normal breath sounds. No wheezing, rhonchi or rales.  Abdominal:     General: Bowel sounds are normal.     Palpations: Abdomen is soft.  Musculoskeletal: Normal range of motion.  Lymphadenopathy:     Cervical: No cervical adenopathy.  Skin:    Findings: No lesion or rash.  Neurological:     Mental Status: He is alert and oriented to person, place, and time.  Psychiatric:        Speech: Speech normal.        Behavior: Behavior normal.        Thought Content: Thought content normal.       Assessment & Plan    1.  Dizziness Over the past couple weeks, he has had 2 episodes of near-syncope. Endocrinologist added Trulicity 1.5 mg q week on 10-05-18 with Metformin, Glipizide and NPH 100 Insulin. Some frequent PVC's on EKG that are the same as the last tracing done on 03-09-18. Will check CBC and CMP and recommend he hold his Lisinopril 40 mg for 1 day and decrease Glipizide to 10 mg qd only (was on 10 mg BID). Should check blood sugar and BP if any return of dizziness or near-syncope. Follow up pending lab reports. - EKG 12-Lead - CBC with Differential/Platelet - Comprehensive metabolic panel  2. Controlled type 2 diabetes mellitus with stage 3 chronic kidney disease, unspecified whether long term insulin use (HCC) States FBS 85 this morning but average 80-135 since starting Trulicity 1.5 mg q week on 10-05-18 by Dr. Gabriel Carina (endocrinologist). Still taking NPH 100 Insulin 28 units BID, Metformin 1000 mg BID and Glipizide 10 mg BID. Couple episodes of fainting and some intermittent dizzinessmay be due to BS drops. Recommend getting labs, check random BS if feeling dizzy, eat all meals, drink extra fluids and decrease Glipizide to only 1 10 mg tablet daily. Keep follow up with Dr. Gabriel Carina as planned. May need earlier follow up with Dr. Gabriel Carina pending lab reports. - CBC with Differential/Platelet - Comprehensive metabolic panel  3. Essential hypertension BP a little low today. Has been on Spironolactone, Metoprolol Succinate, Lisinopril (40 mg qd) and Diltiazem regularly. Will recheck labs and advised to hold Lisinopril for 1 day. Must drink extra fluids and recheck BP at home if any further dizziness. Follow up pending reports. - CBC with Differential/Platelet  4. Paroxysmal atrial fibrillation (HCC) EKG shows sinus rhythm with frequent PVC's similar to last tracing in June 2019. Still taking Metoprolol Succinate and Diltiazem-XR daily. Continue follow up with cardiologist and recheck labs. - EKG 12-Lead - CBC with  Differential/Platelet -  Comprehensive metabolic panel  5. Chronic systolic heart failure (HCC) No dyspnea, palpitations or edema recently. Followed by Dr. Clayborn Bigness (cardiologist - last visit 08-28-18) and presently on Spironolactone 25 mg qd, Diltiazem-XR 129 mg qd. Lasix 40 mg qd prn, KCL 10 meq qd and Metoprolol Succinate 100 mg qd. States he is feeling well today without further syncopal episodes. Check labs for dehydration and electrolyte imbalance. - EKG 12-Lead - CBC with Differential/Platelet - Comprehensive metabolic panel     Vernie Murders, PA  [facility]

## 2018-10-31 LAB — CBC WITH DIFFERENTIAL/PLATELET
Basophils Absolute: 0.1 10*3/uL (ref 0.0–0.2)
Basos: 1 %
EOS (ABSOLUTE): 0.5 10*3/uL — ABNORMAL HIGH (ref 0.0–0.4)
Eos: 6 %
Hematocrit: 42.7 % (ref 37.5–51.0)
Hemoglobin: 14.7 g/dL (ref 13.0–17.7)
IMMATURE GRANULOCYTES: 1 %
Immature Grans (Abs): 0.1 10*3/uL (ref 0.0–0.1)
Lymphocytes Absolute: 2.3 10*3/uL (ref 0.7–3.1)
Lymphs: 28 %
MCH: 30.4 pg (ref 26.6–33.0)
MCHC: 34.4 g/dL (ref 31.5–35.7)
MCV: 88 fL (ref 79–97)
Monocytes Absolute: 0.7 10*3/uL (ref 0.1–0.9)
Monocytes: 8 %
NEUTROS PCT: 56 %
Neutrophils Absolute: 4.7 10*3/uL (ref 1.4–7.0)
Platelets: 190 10*3/uL (ref 150–450)
RBC: 4.84 x10E6/uL (ref 4.14–5.80)
RDW: 14.5 % (ref 11.6–15.4)
WBC: 8.3 10*3/uL (ref 3.4–10.8)

## 2018-10-31 LAB — COMPREHENSIVE METABOLIC PANEL
ALT: 48 IU/L — ABNORMAL HIGH (ref 0–44)
AST: 36 IU/L (ref 0–40)
Albumin/Globulin Ratio: 1.8 (ref 1.2–2.2)
Albumin: 4.7 g/dL (ref 3.7–4.7)
Alkaline Phosphatase: 141 IU/L — ABNORMAL HIGH (ref 39–117)
BUN/Creatinine Ratio: 18 (ref 10–24)
BUN: 30 mg/dL — ABNORMAL HIGH (ref 8–27)
Bilirubin Total: 0.4 mg/dL (ref 0.0–1.2)
CALCIUM: 10.6 mg/dL — AB (ref 8.6–10.2)
CO2: 23 mmol/L (ref 20–29)
Chloride: 98 mmol/L (ref 96–106)
Creatinine, Ser: 1.63 mg/dL — ABNORMAL HIGH (ref 0.76–1.27)
GFR calc Af Amer: 48 mL/min/{1.73_m2} — ABNORMAL LOW (ref >59)
GFR, EST NON AFRICAN AMERICAN: 42 mL/min/{1.73_m2} — AB (ref >59)
Globulin, Total: 2.6 g/dL (ref 1.5–4.5)
Glucose: 94 mg/dL (ref 65–99)
Potassium: 5.4 mmol/L — ABNORMAL HIGH (ref 3.5–5.2)
Sodium: 141 mmol/L (ref 134–144)
Total Protein: 7.3 g/dL (ref 6.0–8.5)

## 2018-11-22 ENCOUNTER — Other Ambulatory Visit: Payer: Self-pay | Admitting: Family Medicine

## 2018-11-22 NOTE — Telephone Encounter (Signed)
Please review. KW 

## 2018-12-05 ENCOUNTER — Ambulatory Visit (INDEPENDENT_AMBULATORY_CARE_PROVIDER_SITE_OTHER): Payer: PPO | Admitting: Family Medicine

## 2018-12-05 ENCOUNTER — Encounter: Payer: Self-pay | Admitting: Family Medicine

## 2018-12-05 VITALS — BP 120/80 | HR 109 | Temp 97.5°F | Resp 16 | Wt 227.0 lb

## 2018-12-05 DIAGNOSIS — R413 Other amnesia: Secondary | ICD-10-CM

## 2018-12-05 DIAGNOSIS — E11649 Type 2 diabetes mellitus with hypoglycemia without coma: Secondary | ICD-10-CM

## 2018-12-05 NOTE — Progress Notes (Signed)
Patient: Tom Moore Male    DOB: 1945/10/20   73 y.o.   MRN: [id number] Visit Date: 12/05/2018  Today's Provider: Vernie Murders, PA    Subjective:     HPI   Patient states after he started Trulicity 3 weeks ago, he has been seeing things. Also patient has been forgetting things.   Allergies  Allergen Reactions  . Zyrtec Allergy [Cetirizine] Diarrhea   Past Medical History:  Diagnosis Date  . Anxiety   . Arthritis   . Depression   . Diabetes mellitus without complication ([place])   . Dysrhythmia    atrial fib  . GERD (gastroesophageal reflux disease)   . Hyperlipidemia   . Hypertension   . Hyperthyroidism   . Insomnia   . Sleep apnea    unable to tolerater CPAP   Past Surgical History:  Procedure Laterality Date  . CARPAL TUNNEL RELEASE Right 04/10/2018   Procedure: RELEASE MEDIAN NERVE AT CARPAL TUNNEL;  Surgeon: Earnestine Leys, MD;  Location: [facility] ORS;  Service: Orthopedics;  Laterality: Right;  . CARPAL TUNNEL RELEASE Left 05/08/2018   Procedure: CARPAL TUNNEL RELEASE;  Surgeon: Earnestine Leys, MD;  Location: [facility] ORS;  Service: Orthopedics;  Laterality: Left;  . TONSILLECTOMY     Family History  Problem Relation Age of Onset  . Hypertension Mother   . Heart disease Mother     Current Outpatient Medications:  .  ALPRAZolam (XANAX) 0.5 MG tablet, Take 1 tablet (0.5 mg total) by mouth 3 (three) times daily as needed for anxiety., Disp: 12 tablet, Rfl: 0 .  apixaban (ELIQUIS) 5 MG TABS tablet, Take 5 mg by mouth 2 (two) times daily. , Disp: , Rfl:  .  atorvastatin (LIPITOR) 40 MG tablet, Take 40 mg by mouth daily., Disp: , Rfl:  .  diltiazem (CARDIZEM CD) 120 MG 24 hr capsule, Take 120 mg by mouth daily. , Disp: , Rfl:  .  Dulaglutide 1.5 MG/0.5ML SOPN, Inject into the skin., Disp: , Rfl:  .  furosemide (LASIX) 40 MG tablet, Take 1 tablet (40 mg total) by mouth 2 (two) times daily., Disp: 180 tablet, Rfl: 1 .  gabapentin (NEURONTIN) 400 MG capsule, Take  1 capsule (400 mg total) by mouth 2 (two) times daily., Disp: 60 capsule, Rfl: 3 .  glipiZIDE (GLUCOTROL) 10 MG tablet, TAKE ONE TABLET BY MOUTH THREE TIMES DAILY BEFORE MEALS, Disp: 270 tablet, Rfl: 1 .  glucose blood (ONE TOUCH ULTRA TEST) test strip, Use 3 (three) times daily One touch ultra test strips e11.29, Disp: , Rfl:  .  insulin NPH Human (HUMULIN N,NOVOLIN N) 100 UNIT/ML injection, Take 15 units BID 12 hours apart (8 am, 8 pm approximately) *Relion brand only please*, Disp: , Rfl:  .  Magnesium Oxide 400 MG CAPS, Take 1 capsule (400 mg total) by mouth 2 (two) times daily., Disp: 180 capsule, Rfl: 3 .  metFORMIN (GLUCOPHAGE) 1000 MG tablet, TAKE 1 TABLET BY MOUTH TWICE DAILY WITH A MEAL, Disp: 180 tablet, Rfl: 0 .  methimazole (TAPAZOLE) 5 MG tablet, Take 5 mg by mouth., Disp: , Rfl:  .  metoprolol succinate (TOPROL-XL) 100 MG 24 hr tablet, Take 200 mg by mouth daily. , Disp: , Rfl: 0 .  multivitamin-iron-minerals-folic acid (THERAPEUTIC-M) TABS tablet, Take 1 tablet by mouth daily., Disp: , Rfl:  .  oxybutynin (DITROPAN) 5 MG tablet, 1 tablet at bedtime, Disp: 30 tablet, Rfl: 11 .  sertraline (ZOLOFT) 100 MG tablet, Take 1  tablet (100 mg total) by mouth daily., Disp: 90 tablet, Rfl: 1 .  spironolactone (ALDACTONE) 25 MG tablet, TAKE 1 TABLET BY MOUTH EVERY DAY, Disp: , Rfl:  .  tamsulosin (FLOMAX) 0.4 MG CAPS capsule, TAKE 1 CAPSULE BY MOUTH ONCE DAILY, Disp: 90 capsule, Rfl: 1 .  venlafaxine XR (EFFEXOR-XR) 150 MG 24 hr capsule, TAKE 1 CAPSULE BY MOUTH ONCE DAILY WITH BREAKFAST (TAKE  WITH  75MG   FOR  A  TOTAL  DAILY  DOSE  OF  225  MG), Disp: 90 capsule, Rfl: 1 .  lisinopril (PRINIVIL,ZESTRIL) 20 MG tablet, Take 40 mg by mouth daily. , Disp: , Rfl: 3 .  omeprazole (PRILOSEC) 40 MG capsule, TAKE 1 CAPSULE(40 MG) BY MOUTH DAILY (Patient not taking: Reported on 12/05/2018), Disp: 90 capsule, Rfl: 0 .  omeprazole (PRILOSEC) 40 MG capsule, TAKE 1 CAPSULE BY MOUTH ONCE DAILY (Patient not  taking: Reported on 10/30/2018), Disp: 90 capsule, Rfl: 1 .  tadalafil (ADCIRCA/CIALIS) 20 MG tablet, Take 1 tablet (20 mg total) by mouth daily as needed for erectile dysfunction. (Patient not taking: Reported on 10/30/2018), Disp: 10 tablet, Rfl: 0 .  triamcinolone cream (KENALOG) 0.1 %, Apply to left ear rash 2-3 x day as needed for itching. (Patient not taking: Reported on 10/30/2018), Disp: 30 g, Rfl: 1  Review of Systems  Constitutional: Negative for appetite change, chills and fever.  Respiratory: Negative for chest tightness, shortness of breath and wheezing.   Cardiovascular: Negative for chest pain and palpitations.  Gastrointestinal: Negative for abdominal pain, nausea and vomiting.   Social History   Tobacco Use  . Smoking status: Former Smoker    Last attempt to quit: 04/03/1988    Years since quitting: 30.6  . Smokeless tobacco: Never Used  Substance Use Topics  . Alcohol use: Not Currently    Comment: Ocassional alcohol use, Drinks wine.     Objective:   BP 120/80 (BP Location: Right Arm, Patient Position: Sitting, Cuff Size: Large)   Pulse (!) 109   Temp (!) 97.5 F (36.4 C) (Oral)   Resp 16   Wt 227 lb (103 kg)   SpO2 97%   BMI 33.52 kg/m    Pulse Readings from Last 3 Encounters:  12/05/18 (!) 109  10/30/18 81  10/26/18 76   Vitals:   12/05/18 1017  BP: 120/80  Pulse: (!) 109  Resp: 16  Temp: (!) 97.5 F (36.4 C)  TempSrc: Oral  SpO2: 97%  Weight: 227 lb (103 kg)   Cognitive Testing - 6-CIT  Correct? Score   What year is it? yes 0 0 or 4  What month is it? yes 0 0 or 3  Memorize:    Pia Mau,  43,  [address]  [place],      What time is it? (within 1 hour) yes 0 0 or 3  Count backwards from 20 yes 0 0, 2, or 4  Name the months of the year yes 0 0, 2, or 4  Repeat name & address above yes 0 0, 2, 4, 6, 8, or 10       TOTAL SCORE  0/28   Interpretation:  Normal  Normal (0-7) Abnormal (8-28)   Physical Exam Constitutional:      General: He is  not in acute distress.    Appearance: He is well-developed.  HENT:     Head: Normocephalic and atraumatic.     Right Ear: Hearing and tympanic membrane normal.  Left Ear: Hearing and tympanic membrane normal.     Nose: Nose normal.  Eyes:     General: Lids are normal. No scleral icterus.       Right eye: No discharge.        Left eye: No discharge.     Conjunctiva/sclera: Conjunctivae normal.  Neck:     Musculoskeletal: Neck supple.  Cardiovascular:     Rate and Rhythm: Regular rhythm. Tachycardia present.     Heart sounds: Normal heart sounds. No murmur. No gallop.   Pulmonary:     Effort: Pulmonary effort is normal. No respiratory distress.  Abdominal:     General: Bowel sounds are normal.     Palpations: Abdomen is soft.  Musculoskeletal: Normal range of motion.  Skin:    Findings: No lesion or rash.  Neurological:     Mental Status: He is alert and oriented to person, place, and time.  Psychiatric:        Speech: Speech normal.        Behavior: Behavior normal.        Thought Content: Thought content normal.    Depression screen [facility] 2/9 12/21/2018 10/12/2018 08/28/2018 05/05/2017 05/05/2017  Decreased Interest 0 2 3 3  0  Down, Depressed, Hopeless 3 2 3 2  0  PHQ - 2 Score 3 4 6 5  0  Altered sleeping 3 - - 2 -  Tired, decreased energy 3 - - 3 -  Change in appetite 3 - - 1 -  Feeling bad or failure about yourself  3 - - 0 -  Trouble concentrating 3 - - 1 -  Moving slowly or fidgety/restless 0 - - 0 -  Suicidal thoughts 0 - - 0 -  PHQ-9 Score 18 - - 12 -  Difficult doing work/chores Very difficult - - Somewhat difficult -       Assessment & Plan    1. Hypoglycemic episode in patient with diabetes mellitus ([place]) Started Trulicity 3 weeks ago. Has had some episodes of floaters in vision that he describes as "ants". No dizziness, unilateral weakness or other neurologic deficits. Still taking Metformin 1000 mg BID, Insulin NPH 15 units BID and Glipizide 10 mg qd. Recommend  he hold the Glipizide as this seems to be hypoglycemic episodes. Counseled regarding checking FBS daily and RBS anytime he has symptoms. Also, discussed the use of protein snacks to stabilize sugar if it is below 70 or having hypoglycemic symptoms. He should continue follow up with Dr. Gabriel Carina (endocrinologist) regularly.  2. Memory difficulties Long history of some recall issues with occasional need for prompting. Normal MMSE. No neurologic deficits. A little anxious today. Family with him today and does not seem to have significant concerns.     Vernie Murders, PA  [facility]

## 2018-12-11 DIAGNOSIS — B351 Tinea unguium: Secondary | ICD-10-CM | POA: Diagnosis not present

## 2018-12-11 DIAGNOSIS — M79675 Pain in left toe(s): Secondary | ICD-10-CM | POA: Diagnosis not present

## 2018-12-11 DIAGNOSIS — M79674 Pain in right toe(s): Secondary | ICD-10-CM | POA: Diagnosis not present

## 2018-12-21 ENCOUNTER — Other Ambulatory Visit: Payer: Self-pay

## 2018-12-21 NOTE — Patient Outreach (Signed)
[facility]) Care Management  12/21/2018  Tom Moore May 25, 1946 [id number]  Nurse Call Line Referral Date: 12/21/18 Reason for Referral: feeling dizzy Nurse call line recommendation: see primary MD within 24 hours  Received incoming call from patient.  HIPAA verified.  Explained to patient reason for initial outreach call.  Patient states he called the nurse call line due to feeling dizzy.  Patient states he is feeling much better today.  Patient states he has reported to his doctor in the past about the dizzy feelings he gets.  Patient states his doctor has instructed him to check his blood pressure and blood sugar. Patient states his blood pressure was fine.  Patient states his blood sugar on yesterday was 97. He states he ate a piece of chocolate and felt better.  Patient reports today's blood sugar in 114 and he feels fine.   Patient states he is on 2 insulin medications daily and takes trulicity 1 time per week.  He reports he checks his blood sugar daily. Patient states his most recent A1c is 7.2.  He reports having diabetes for approximately 20 years.  Patient states he feels he manages his blood sugar ok but states he gets dizzy sometimes in the evening.   Patient states he has depression symptoms.  He reports taking medication for anxiety/ depression. He denies having a psychiatrist.  Patient states he only sees his primary MD for his depression symptoms. Patient reports taking approximately 20 medications. Patient states he feels tired all the time and states he feels he sleeps too much.  RNCM discussed and offered [facility] care management services. Patient verbally agreed.   ASSESSMENT:  PHQ2 - 3  PHQ9 - 18   PLAN: RNCM will refer patient to health coach, pharmacist, and social worker  Quinn Plowman RN,BSN,CCM [facility] Telephonic  [phone]

## 2018-12-21 NOTE — Patient Outreach (Signed)
[name]) Care Management  12/21/2018  Tom Moore September 30, 1945 [id number]  Nurse Call Line Referral Date: 12/21/18 Reason for Referral: feeling dizzy Nurse call line recommendation: see primary MD within 24 hours  Attempt #1  Telephone call to patient regarding nurse call line referral utilizing pacific interpreter.  Interpreter's ID number [id number].  Unable to reach patient. HIPAA compliant message left with call back phone number.   PLAN: RNCM will attempt 2nd telephone call to patient within 4 business days.  RNCM will send outreach letter to attempt contact  Quinn Plowman RN,BSN,CCM [facility] Telephonic  [phone]

## 2018-12-22 ENCOUNTER — Ambulatory Visit: Payer: Self-pay

## 2018-12-25 ENCOUNTER — Other Ambulatory Visit: Payer: Self-pay | Admitting: *Deleted

## 2018-12-25 NOTE — Patient Outreach (Signed)
[facility]) Care Management  [facility] Social Work  12/25/2018  Tom Moore [birth year] [id number]  Subjective:  "I am depressed"  Objective: [facility] CSW to assist patient and family with community based resources to aide in their well-being, quality of life and overall safety and needs.    Encounter Medications:  Outpatient Encounter Medications as of 12/25/2018  Medication Sig  . ALPRAZolam (XANAX) 0.5 MG tablet Take 1 tablet (0.5 mg total) by mouth 3 (three) times daily as needed for anxiety.  Marland Kitchen apixaban (ELIQUIS) 5 MG TABS tablet Take 5 mg by mouth 2 (two) times daily.   Marland Kitchen atorvastatin (LIPITOR) 40 MG tablet Take 40 mg by mouth daily.  Marland Kitchen diltiazem (CARDIZEM CD) 120 MG 24 hr capsule Take 120 mg by mouth daily.   . Dulaglutide 1.5 MG/0.5ML SOPN Inject into the skin.  . furosemide (LASIX) 40 MG tablet Take 1 tablet (40 mg total) by mouth 2 (two) times daily.  Marland Kitchen gabapentin (NEURONTIN) 400 MG capsule Take 1 capsule (400 mg total) by mouth 2 (two) times daily.  Marland Kitchen glipiZIDE (GLUCOTROL) 10 MG tablet TAKE ONE TABLET BY MOUTH THREE TIMES DAILY BEFORE MEALS  . glucose blood (ONE TOUCH ULTRA TEST) test strip Use 3 (three) times daily One touch ultra test strips e11.29  . insulin NPH Human (HUMULIN N,NOVOLIN N) 100 UNIT/ML injection Take 15 units BID 12 hours apart (8 am, 8 pm approximately) *Relion brand only please*  . lisinopril (PRINIVIL,ZESTRIL) 20 MG tablet Take 40 mg by mouth daily.   . Magnesium Oxide 400 MG CAPS Take 1 capsule (400 mg total) by mouth 2 (two) times daily.  . metFORMIN (GLUCOPHAGE) 1000 MG tablet TAKE 1 TABLET BY MOUTH TWICE DAILY WITH A MEAL  . methimazole (TAPAZOLE) 5 MG tablet Take 5 mg by mouth.  . metoprolol succinate (TOPROL-XL) 100 MG 24 hr tablet Take 200 mg by mouth daily.   . multivitamin-iron-minerals-folic acid (THERAPEUTIC-M) TABS tablet Take 1 tablet by mouth daily.  Marland Kitchen omeprazole (PRILOSEC) 40 MG capsule TAKE 1 CAPSULE(40 MG) BY MOUTH DAILY  (Patient not taking: Reported on 12/05/2018)  . omeprazole (PRILOSEC) 40 MG capsule TAKE 1 CAPSULE BY MOUTH ONCE DAILY (Patient not taking: Reported on 10/30/2018)  . oxybutynin (DITROPAN) 5 MG tablet 1 tablet at bedtime  . sertraline (ZOLOFT) 100 MG tablet Take 1 tablet (100 mg total) by mouth daily.  Marland Kitchen spironolactone (ALDACTONE) 25 MG tablet TAKE 1 TABLET BY MOUTH EVERY DAY  . tadalafil (ADCIRCA/CIALIS) 20 MG tablet Take 1 tablet (20 mg total) by mouth daily as needed for erectile dysfunction. (Patient not taking: Reported on 10/30/2018)  . tamsulosin (FLOMAX) 0.4 MG CAPS capsule TAKE 1 CAPSULE BY MOUTH ONCE DAILY  . triamcinolone cream (KENALOG) 0.1 % Apply to left ear rash 2-3 x day as needed for itching. (Patient not taking: Reported on 10/30/2018)  . venlafaxine XR (EFFEXOR-XR) 150 MG 24 hr capsule TAKE 1 CAPSULE BY MOUTH ONCE DAILY WITH BREAKFAST (TAKE  WITH  75MG   FOR  A  TOTAL  DAILY  DOSE  OF  225  MG)   No facility-administered encounter medications on file as of 12/25/2018.     Functional Status:  In your present state of health, do you have any difficulty performing the following activities: 05/01/2018 04/03/2018  Hearing? N N  Vision? N N  Difficulty concentrating or making decisions? N N  Walking or climbing stairs? N N  Dressing or bathing? N N  Doing errands, shopping? N N  Some recent data might be hidden    Fall/Depression Screening:  PHQ 2/9 Scores 12/25/2018 12/21/2018 10/12/2018 08/28/2018 05/05/2017 05/05/2017 12/17/2016  PHQ - 2 Score 4 3 4 6 5  0 6  PHQ- 9 Score 18 18 - - 12 - 19    Assessment: CSW made initial phone contact with pt today. Pt's identity was confirmed and CSW introduced self and reason for call. Pt admits to being depressed and is wanting help.  He reports being on an anti-depressant for about 3 years and feels it is not working well anymore. He denies any history or current SI/HI. He scored high on PHQ-9 Depression screening and feels today that he is a "7" with 10  the worst.  He agrees for CSW to contact his PCP regarding RX for depression. He also shared with CSW that he was not able to pay ($214) for his "Shot" (Trulicity?) and have made him aware [facility] should be contacting him to assess and assist further.     Plan: CSW will communicate with pt's PCP the concerns related to his depression and RX treatment plans.   Eduard Clos, MSW, [place] Worker  [place]  [phone]

## 2018-12-28 ENCOUNTER — Telehealth: Payer: Self-pay

## 2018-12-28 ENCOUNTER — Ambulatory Visit: Payer: Self-pay | Admitting: *Deleted

## 2018-12-28 NOTE — Telephone Encounter (Signed)
Marcie Bal with [facility] had called wanting to speak with doctor about patients mental health. Marcie Bal states that patient depression seems to be getting worse and wanted to discuss with Dr. Rosanna Randy about adjusting dose of medications. KW

## 2018-12-29 NOTE — Telephone Encounter (Signed)
Can we arrange Web ex with pt?

## 2018-12-29 NOTE — Telephone Encounter (Signed)
Spoke to pt and he scheduled e-visit for 01/01/19 at 1:20pm

## 2019-01-01 ENCOUNTER — Ambulatory Visit (INDEPENDENT_AMBULATORY_CARE_PROVIDER_SITE_OTHER): Payer: PPO | Admitting: Family Medicine

## 2019-01-01 ENCOUNTER — Ambulatory Visit: Payer: Self-pay | Admitting: Pharmacist

## 2019-01-01 ENCOUNTER — Other Ambulatory Visit: Payer: Self-pay

## 2019-01-01 ENCOUNTER — Encounter: Payer: Self-pay | Admitting: Family Medicine

## 2019-01-01 VITALS — BP 122/70 | HR 72 | Temp 98.3°F | Resp 20 | Wt 223.0 lb

## 2019-01-01 DIAGNOSIS — E1122 Type 2 diabetes mellitus with diabetic chronic kidney disease: Secondary | ICD-10-CM

## 2019-01-01 DIAGNOSIS — I480 Paroxysmal atrial fibrillation: Secondary | ICD-10-CM | POA: Diagnosis not present

## 2019-01-01 DIAGNOSIS — F321 Major depressive disorder, single episode, moderate: Secondary | ICD-10-CM | POA: Diagnosis not present

## 2019-01-01 DIAGNOSIS — J9601 Acute respiratory failure with hypoxia: Secondary | ICD-10-CM | POA: Diagnosis not present

## 2019-01-01 DIAGNOSIS — F419 Anxiety disorder, unspecified: Secondary | ICD-10-CM

## 2019-01-01 DIAGNOSIS — N183 Chronic kidney disease, stage 3 unspecified: Secondary | ICD-10-CM

## 2019-01-01 DIAGNOSIS — G4733 Obstructive sleep apnea (adult) (pediatric): Secondary | ICD-10-CM | POA: Diagnosis not present

## 2019-01-01 MED ORDER — BUPROPION HCL 75 MG PO TABS: 75.0000 mg | ORAL_TABLET | Freq: Two times a day (BID) | ORAL | 5 refills | Status: DC

## 2019-01-01 NOTE — Patient Instructions (Signed)
Tom Moore was given information about Chronic Care Management services today including:  1. CCM service includes personalized support from designated clinical staff supervised by his physician, including individualized plan of care and coordination with other care providers 2. 24/7 contact phone numbers for assistance for urgent and routine care needs. 3. Service will only be billed when office clinical staff spend 20 minutes or more in a month to coordinate care. 4. Only one practitioner may furnish and bill the service in a calendar month. 5. The patient may stop CCM services at any time (effective at the end of the month) by phone call to the office staff. 6. The patient will be responsible for cost sharing (co-pay) of up to 20% of the service fee (after annual deductible is met).  Patient agreed to services and verbal consent obtained.    Please bring all medications to your appointment! Initial appointment scheduled for: Wednesday, April 8 at 1:30 pm  Please call a member of the CCM (Chronic Care Management) Team with any questions or case management needs:   Vanetta Mulders, BSN Nurse Care Coordinator  [phone]  [name], PharmD  Clinical Pharmacist  [phone]  Elliot Gurney, LCSW Clinical Social Worker [phone]

## 2019-01-01 NOTE — Progress Notes (Signed)
Patient: Tom Moore Male    DOB: 22-Feb-1946   73 y.o.   MRN: [id number] Visit Date: 01/01/2019  Today's Provider: [name], MD   Chief Complaint  Patient presents with   Depression   Subjective:     HPI  Depression, Follow-up  Hewas last seen for this 3 monthsago. Changes made at last visit include continue Zoloft 100mg . Hereports (excellent)compliance with treatment. He (is not)having side effects  Hereports (good)tolerance of treatment. Current symptoms include: depressed mood,crying episode  Patient reports poor symptom control on medication.   He  reports his depression started getting worse at the first of the year.  He is not suicidal or homicidal. He has never had counseling for his depression.  He is willing to consider this.  He has anhedonia and he can sleep all day according to the patient. His diabetes and thyroid disease are followed by Dr. Mickie Kay from endocrinology.  He has no other complaints. I have not seen the patient in at least 25 years.  He has been followed by Mariel Sleet PAC.  Allergies  Allergen Reactions   Zyrtec Allergy [Cetirizine] Diarrhea     Current Outpatient Medications:    ALPRAZolam (XANAX) 0.5 MG tablet, Take 1 tablet (0.5 mg total) by mouth 3 (three) times daily as needed for anxiety., Disp: 12 tablet, Rfl: 0   apixaban (ELIQUIS) 5 MG TABS tablet, Take 5 mg by mouth 2 (two) times daily. , Disp: , Rfl:    atorvastatin (LIPITOR) 40 MG tablet, Take 40 mg by mouth daily., Disp: , Rfl:    Dulaglutide 1.5 MG/0.5ML SOPN, Inject into the skin., Disp: , Rfl:    furosemide (LASIX) 40 MG tablet, Take 1 tablet (40 mg total) by mouth 2 (two) times daily., Disp: 180 tablet, Rfl: 1   gabapentin (NEURONTIN) 400 MG capsule, Take 1 capsule (400 mg total) by mouth 2 (two) times daily., Disp: 60 capsule, Rfl: 3   glipiZIDE (GLUCOTROL) 10 MG tablet, TAKE ONE TABLET BY MOUTH THREE TIMES DAILY BEFORE MEALS, Disp: 270  tablet, Rfl: 1   glucose blood (ONE TOUCH ULTRA TEST) test strip, Use 3 (three) times daily One touch ultra test strips e11.29, Disp: , Rfl:    insulin NPH Human (HUMULIN N,NOVOLIN N) 100 UNIT/ML injection, Take 15 units BID 12 hours apart (8 am, 8 pm approximately) *Relion brand only please*, Disp: , Rfl:    lisinopril (PRINIVIL,ZESTRIL) 20 MG tablet, Take 40 mg by mouth daily. , Disp: , Rfl: 3   Magnesium Oxide 400 MG CAPS, Take 1 capsule (400 mg total) by mouth 2 (two) times daily., Disp: 180 capsule, Rfl: 3   metFORMIN (GLUCOPHAGE) 1000 MG tablet, TAKE 1 TABLET BY MOUTH TWICE DAILY WITH A MEAL, Disp: 180 tablet, Rfl: 0   methimazole (TAPAZOLE) 5 MG tablet, Take 5 mg by mouth., Disp: , Rfl:    metoprolol succinate (TOPROL-XL) 100 MG 24 hr tablet, Take 200 mg by mouth daily. , Disp: , Rfl: 0   multivitamin-iron-minerals-folic acid (THERAPEUTIC-M) TABS tablet, Take 1 tablet by mouth daily., Disp: , Rfl:    omeprazole (PRILOSEC) 40 MG capsule, TAKE 1 CAPSULE(40 MG) BY MOUTH DAILY, Disp: 90 capsule, Rfl: 0   omeprazole (PRILOSEC) 40 MG capsule, TAKE 1 CAPSULE BY MOUTH ONCE DAILY, Disp: 90 capsule, Rfl: 1   oxybutynin (DITROPAN) 5 MG tablet, 1 tablet at bedtime, Disp: 30 tablet, Rfl: 11   Semaglutide,0.25 or 0.5MG /DOS, 2 MG/1.5ML SOPN, Inject into the  skin., Disp: , Rfl:    sertraline (ZOLOFT) 100 MG tablet, Take 1 tablet (100 mg total) by mouth daily., Disp: 90 tablet, Rfl: 1   spironolactone (ALDACTONE) 25 MG tablet, TAKE 1 TABLET BY MOUTH EVERY DAY, Disp: , Rfl:    tadalafil (ADCIRCA/CIALIS) 20 MG tablet, Take 1 tablet (20 mg total) by mouth daily as needed for erectile dysfunction., Disp: 10 tablet, Rfl: 0   tamsulosin (FLOMAX) 0.4 MG CAPS capsule, TAKE 1 CAPSULE BY MOUTH ONCE DAILY, Disp: 90 capsule, Rfl: 1   triamcinolone cream (KENALOG) 0.1 %, Apply to left ear rash 2-3 x day as needed for itching., Disp: 30 g, Rfl: 1   venlafaxine XR (EFFEXOR-XR) 150 MG 24 hr capsule, TAKE 1  CAPSULE BY MOUTH ONCE DAILY WITH BREAKFAST (TAKE  WITH  75MG   FOR  A  TOTAL  DAILY  DOSE  OF  225  MG), Disp: 90 capsule, Rfl: 1   diltiazem (CARDIZEM CD) 120 MG 24 hr capsule, Take 120 mg by mouth daily. , Disp: , Rfl:   Review of Systems  Constitutional: Positive for fatigue.  HENT: Negative.   Eyes: Negative.   Respiratory: Negative.   Cardiovascular: Negative.   Gastrointestinal: Negative.   Endocrine: Negative.   Genitourinary: Negative.   Musculoskeletal: Negative.   Allergic/Immunologic: Negative.   Neurological: Negative.   Psychiatric/Behavioral: Positive for behavioral problems, decreased concentration, dysphoric mood and sleep disturbance. The patient is nervous/anxious.     Social History   Tobacco Use   Smoking status: Former Smoker    Last attempt to quit: 04/03/1988    Years since quitting: 30.7   Smokeless tobacco: Never Used  Substance Use Topics   Alcohol use: Not Currently    Comment: Ocassional alcohol use, Drinks wine.      Objective:   BP 122/70 (BP Location: Right Arm, Patient Position: Sitting, Cuff Size: Large)    Pulse 72    Temp 98.3 F (36.8 C)    Resp 20    Wt 223 lb (101.2 kg) Comment: patient reports from scale at home   SpO2 99%    BMI 32.93 kg/m  Vitals:   01/01/19 0935  BP: 122/70  Pulse: 72  Resp: 20  Temp: 98.3 F (36.8 C)  SpO2: 99%  Weight: 223 lb (101.2 kg)     Physical Exam Vitals signs reviewed.  Constitutional:      Appearance: Normal appearance.  HENT:     Head: Normocephalic and atraumatic.     Right Ear: External ear normal.     Left Ear: External ear normal.     Nose: Nose normal.     Mouth/Throat:     Pharynx: Oropharynx is clear.  Eyes:     General: No scleral icterus. Cardiovascular:     Rate and Rhythm: Normal rate and regular rhythm.     Pulses: Normal pulses.     Heart sounds: Normal heart sounds.  Pulmonary:     Effort: Pulmonary effort is normal.     Breath sounds: Normal breath sounds.    Abdominal:     General: Abdomen is flat.     Palpations: Abdomen is soft.  Musculoskeletal:     Right lower leg: No edema.     Left lower leg: No edema.  Skin:    General: Skin is dry.  Neurological:     General: No focal deficit present.     Mental Status: He is alert and oriented to person, place, and time. Mental status  is at baseline.  Psychiatric:        Mood and Affect: Mood normal.        Behavior: Behavior normal.        Thought Content: Thought content normal.        Judgment: Judgment normal.         Assessment & Plan    1. Depression, major, single episode, moderate ([place]) Patient's depression is worsening.  He is not suicidal.  At this time refer to CCM for counseling.  I will see him back in 1 month on new medication Wellbutrin.  May need psychiatric referral.  Please 50% of this 25-minute visit is spent in counseling and coordination of care for this patient who have not seen in more than 25 years. - Ambulatory referral to Chronic Care Management Services  2. Anxiety Consider a mood stabilizer on the next visit.  3. CKD stage 3 due to type 2 diabetes mellitus ([place]) Needs routine lab work in the near future.  Diabetes mellitus followed by Dr. Elisabeth Cara Will discuss lipids on next visit. 4. Paroxysmal atrial fibrillation (HCC)   5. OSA (obstructive sleep apnea) Patient has OSA but cannot tolerate CPAP.  Will address this on the next visit.  May repeat sleep study and try BiPAP.     Patryk Conant Cranford Mon, MD  [facility] I,Rachelle L Pressley,acting as a scribe for [name], MD.,have documented all relevant documentation on the behalf of [name], MD,as directed by  [name], MD while in the presence of [name], MD.

## 2019-01-01 NOTE — Chronic Care Management (AMB) (Signed)
  Chronic Care Management   Note  01/01/2019 Name: Tom Moore MRN: [id number] DOB: December 29, 1945  Tom Moore is a 73 y.o. year old male who sees Jerrol Banana., MD for primary care. Dr. Rosanna Randy asked the CCM team to consult the patient for assistance with chronic disease management related to recurrent depression, diabeted. Referral was placed 01/01/19. Telephone outreach to patient today to introduce CCM services.   Of note, patient recently engaged with [place] team: health coach [place], LCSW Eduard Clos, and PharmD Ralene Bathe. Will message these providers to facilitate transition to embedded CCM team.   Plan: [name] agreed to services and verbal consent obtained. I have scheduled an appointment for Tom Moore for Wednesday, April 8.    Tom Moore was given information about Chronic Care Management services today including:  1. CCM service includes personalized support from designated clinical staff supervised by her physician, including individualized plan of care and coordination with other care providers 2. 24/7 contact phone numbers for assistance for urgent and routine care needs. 3. Service will only be billed when office clinical staff spend 20 minutes or more in a month to coordinate care. 4. Only one practitioner may furnish and bill the service in a calendar month. 5. The patient may stop CCM services at any time (effective at the end of the month) by phone call to the office staff. 6. The patient will be responsible for cost sharing (co-pay) of up to 20% of the service fee (after annual deductible is met).  Patient agreed to services and verbal consent obtained.     Tom Moore, PharmD Clinical Pharmacist [place] [phone]

## 2019-01-02 ENCOUNTER — Other Ambulatory Visit: Payer: Self-pay | Admitting: *Deleted

## 2019-01-02 ENCOUNTER — Ambulatory Visit: Payer: Self-pay | Admitting: *Deleted

## 2019-01-02 NOTE — Patient Outreach (Signed)
[facility]) Care Management  01/02/2019  JAYMIR STRUBLE 03/31/1946 [id number]   CSW spoke with Chrystal Land, LCSW, [facility] Embedded CSW, to brief and pass patient case for follow up in PCP's office.  This CSW will sign off and attempt to communicate with pt above plans. Voice message left for pt today.   Eduard Clos, MSW, [place] Worker  [place] [phone]

## 2019-01-03 ENCOUNTER — Ambulatory Visit: Payer: PPO | Admitting: *Deleted

## 2019-01-03 ENCOUNTER — Other Ambulatory Visit: Payer: Self-pay

## 2019-01-03 ENCOUNTER — Encounter: Payer: Self-pay | Admitting: Pharmacist

## 2019-01-03 ENCOUNTER — Telehealth: Payer: Self-pay | Admitting: *Deleted

## 2019-01-03 DIAGNOSIS — F419 Anxiety disorder, unspecified: Secondary | ICD-10-CM

## 2019-01-03 DIAGNOSIS — F321 Major depressive disorder, single episode, moderate: Secondary | ICD-10-CM

## 2019-01-03 NOTE — Patient Instructions (Signed)
Thank you allowing the Chronic Care Management Team to be a part of your care! It was a pleasure speaking with you today!  1. Please continue to consider follow up with a psychiatrist as well as counselor 2. Please continue to engage with CCM Social Worker and CCM Pharmacist.  CCM (Chronic Care Management) Team   Trish Fountain RN, BSN Nurse Care Coordinator  [phone]  [name] PharmD  Clinical Pharmacist  [phone]   [place], [place] Worker [phone]  Goals Addressed            This Visit's Progress   . " I would like to feel better (pt-stated)       Current Barriers:  . Financial constraints . Mental Health Concerns   Clinical Social Work Clinical Goal(s):  Marland Kitchen Over the next 30  days, client will work with SW to address concerns related to his symptoms of depression  Interventions: . Patient interviewed and appropriate assessments performed . Provided mental health counseling and emotional support with regard to depressed mood (mental health diagnosis or concern) . Discussed plans with patient for ongoing care management follow up and provided patient with direct contact information for care management team . Advised patient that a referral will be made for a psychiatric consult  Patient Self Care Activities:  . Self administers medications as prescribed . Attends all scheduled provider appointments . Performs ADL's independently . Performs IADL's independently  Initial goal documentation         The patient verbalized understanding of instructions provided today and declined a print copy of patient instruction materials.   The CM team will reach out to the patient again over the next 7 days.

## 2019-01-03 NOTE — Chronic Care Management (AMB) (Signed)
Initial goal documentation Chronic Care Management    Clinical Social Work INITIAL Behavioral Health Screening Note  01/03/2019 Name: Tom Moore MRN: [id number] DOB: 1945/11/24  Tom Moore is a 73 y.o. year old male who sees Tom Moore., MD for primary care. Dr. Rosanna Randy asked the CCM team to consult the patient for assistance with Mental Health Counseling and Resources.  Patient agreed to services and verbal consent obtained by CCM Pharmacist [name].  Referred by: PCP, Dr.Gilbert  Review of patient status, including review of consultants reports, relevant laboratory and other test results, and collaboration with appropriate care team members and the patient's provider was performed as part of comprehensive patient evaluation and provision of chronic care management services.   Patient and/or legal guardian verbally consented to meet with LCSW about presenting concerns.  OBJECTIVE:  Depression screen [facility] 2/9 01/03/2019 12/25/2018 12/21/2018  Decreased Interest 3 2 0  Down, Depressed, Hopeless 3 2 3   PHQ - 2 Score 6 4 3   Altered sleeping 3 3 3   Tired, decreased energy 3 3 3   Change in appetite 2 2 3   Feeling bad or failure about yourself  2 2 3   Trouble concentrating 3 3 3   Moving slowly or fidgety/restless 1 1 0  Suicidal thoughts 0 0 0  PHQ-9 Score 20 18 18   Difficult doing work/chores Very difficult Very difficult Very difficult     No flowsheet data found.   Current and history of substance use:  None   Family/Social Information:  Housing Arrangement: Patient lives alone      Family members/support persons in your life? Patient has a son and daughter as well as access to family and friends but tend to isolate himself due to his depression.    Transportation to appointments provided by patient.  Financial concerns: Per patient, he is having trouble affording his medications. Referral has been placed for CCM Pharmacist to assess for any possible  assistance.  Food Security: None  Access to Dental Care: yes  Medication Concerns: Per patient, he is having trouble affording his medications. Referral has been placed for CCM Pharmacist to assess for any possible assistance.  Services Currently in place:  None  Patient enjoys: Patient unable to identify enjoyable activities.  Patient reported stressors: Patient states that he is unable to identify specific triggers or stressors related to his depression.  Caregiving Needs Primary Caregiver? None  Caregiver availability:   Caregiver Stress Assessment:   Health Status of Caregiver:    Physical Limitations of Caregiver:     Goals Addressed            This Visit's Progress   . " I would like to feel better (pt-stated)       This social worker contacted patient today by phone. Patient described symptoms of depression and feeling unable to identify a particular trigger. Patient discussed a decrease in appetite, sleep, and lack of interest/enjoyment in social activities. Per patient, it is a struggle to get out of bed in the morning to complete his ADL's. Patient reports a long history of depression, however his symptoms have seemed to increase since he stopped working in 2018. Per patient, when he did work his schedule was 9 hours a day, 7 days a week. With this schedule, his mind remained occupied. Patient denied any current thoughts of self harm or harm to others.  Current Barriers:  . Financial constraints . Mental Health Concerns   Clinical Social Work Clinical Goal(s):  .  Over the next 30  days, client will work with SW to address concerns related to his symptoms of depression  Interventions: . Patient interviewed and appropriate assessments performed . Provided mental health counseling with regard to depressed mood (mental health diagnosis or concern) . Discussed plans with patient for ongoing care management follow up and provided patient with direct contact  information for care management team . Advised patient that a referral will be made for a psychiatric consult  Patient Self Care Activities:  . Self administers medications as prescribed . Attends all scheduled provider appointments . Performs ADL's independently . Performs IADL's independently  Initial goal documentation         Follow Up Plan: SW will follow up with patient by phone over the next week   Chrystal Land, [place] Worker  [place] Management [phone]

## 2019-01-10 ENCOUNTER — Ambulatory Visit: Payer: Self-pay | Admitting: *Deleted

## 2019-01-10 DIAGNOSIS — F419 Anxiety disorder, unspecified: Secondary | ICD-10-CM

## 2019-01-10 DIAGNOSIS — F329 Major depressive disorder, single episode, unspecified: Secondary | ICD-10-CM

## 2019-01-10 NOTE — Chronic Care Management (AMB) (Signed)
  Chronic Care Management    Clinical Social Work Follow Up Note  01/10/2019 Name: Tom Moore MRN: [id number] DOB: 1945-12-06  Tom Moore is a 73 y.o. year old male who is a primary care patient of Jerrol Banana., MD. The CCM team was consulted for assistance with Mental Health Counseling and Resources.   Review of patient status, including review of consultants reports, other relevant assessments, and collaboration with appropriate care team members and the patient's provider was performed as part of comprehensive patient evaluation and provision of chronic care management services.     Goals Addressed            This Visit's Progress   . " I would like to feel better (pt-stated)       This social worker spoke to patient today by phone who reported an improvement in mood since the last phone call with this Education officer, museum. Per patient, he feels that the new medication he was prescribed is beginning to take effect. Patient described an increase in energy and motivation. This Education officer, museum discussed the importance of developing a plan to be used to maintain his symptoms and to be used in the event symptoms re-surface. Patient continues to agree with referral to a Psychiatrist for a psychiatric evaluation and ongoing mental health follow up.  Current Barriers:  . Financial constraints . Mental Health Concerns   Clinical Social Work Clinical Goal(s):  Marland Kitchen Over the next 30  days, client will work with SW to address concerns related to his symptoms of depression  Interventions: . Patient interviewed and appropriate assessments performed . Continued to provide mental health counseling and emotional support with regard to patient's sumptoms of depression (mental health diagnosis or concern) . Discussed plans with patient for ongoing care management follow up and provided patient with direct contact information for care management team . Advised patient on status of referral that  will be made for a psychiatric consult . Emotional support provided, emphasizing self care and positive coping  Patient Self Care Activities:  . Self administers medications as prescribed . Attends all scheduled provider appointments . Performs ADL's independently . Performs IADL's independently  Please see past updates related to this goal by clicking on the "Past Updates" button in the selected goal          Follow Up Plan: SW will follow up with patient by phone over the next 2 weeks    [place], [place] Worker  [facility] [phone]

## 2019-01-10 NOTE — Patient Instructions (Signed)
Thank you allowing the Chronic Care Management Team to be a part of your care! It was a pleasure speaking with you today!  1. Please continue to utilize positive coping to address symptoms of depression and anxiety. 2. Please continue to take medication as prescribed.     CCM (Chronic Care Management) Team   Trish Fountain RN, BSN Nurse Care Coordinator  [phone]  [name] PharmD  Clinical Pharmacist  [phone]   [place], LCSW Clinical Social Worker [phone]  Goals Addressed            This Visit's Progress   . " I would like to feel better (pt-stated)       Current Barriers:  . Financial constraints . Mental Health Concerns   Clinical Social Work Clinical Goal(s):  Marland Kitchen Over the next 30  days, client will work with SW to address concerns related to his symptoms of depression  Interventions: . Patient interviewed and appropriate assessments performed . Continued to provide mental health counseling and emotional support with regard to patient's sumptoms of depression (mental health diagnosis or concern) . Discussed plans with patient for ongoing care management follow up and provided patient with direct contact information for care management team . Advised patient on status of referral that will be made for a psychiatric consult . Emotional support provided, emphasizing self care and positive coping  Patient Self Care Activities:  . Self administers medications as prescribed . Attends all scheduled provider appointments . Performs ADL's independently . Performs IADL's independently  Please see past updates related to this goal by clicking on the "Past Updates" button in the selected goal          The patient verbalized understanding of instructions provided today and declined a print copy of patient instruction materials.   The CM team will reach out to the patient again over the next 14 days.

## 2019-01-16 ENCOUNTER — Ambulatory Visit: Payer: Self-pay | Admitting: *Deleted

## 2019-01-16 ENCOUNTER — Ambulatory Visit (INDEPENDENT_AMBULATORY_CARE_PROVIDER_SITE_OTHER): Payer: PPO | Admitting: Pharmacist

## 2019-01-16 DIAGNOSIS — E1122 Type 2 diabetes mellitus with diabetic chronic kidney disease: Secondary | ICD-10-CM | POA: Diagnosis not present

## 2019-01-16 DIAGNOSIS — F419 Anxiety disorder, unspecified: Secondary | ICD-10-CM

## 2019-01-16 DIAGNOSIS — N183 Chronic kidney disease, stage 3 unspecified: Secondary | ICD-10-CM

## 2019-01-16 DIAGNOSIS — F329 Major depressive disorder, single episode, unspecified: Secondary | ICD-10-CM

## 2019-01-16 DIAGNOSIS — E11649 Type 2 diabetes mellitus with hypoglycemia without coma: Secondary | ICD-10-CM

## 2019-01-16 NOTE — Chronic Care Management (AMB) (Signed)
  Chronic Care Management   Social Work Note  01/16/2019 Name: [name] MRN: [id number] DOB: 11-28-1945  [name] is a 73 y.o. year old male who sees Jerrol Banana., MD for primary care. The CCM team was consulted for assistance with Mental Health Counseling and Resources.   Phone call to [facility] [phone] to discuss patient referral. Voicemail message left for a return call.  Phone call to [facility] [phone] to discuss referral. They are not taking new patient's for therapy, however they have capacity for medication management. This Education officer, museum to discuss above option with patient.  Goals Addressed   None     Follow Up Plan: SW will follow up with patient by phone over the next 2 weeks   Ayano Douthitt, [place] Worker  [facility] [phone]

## 2019-01-16 NOTE — Chronic Care Management (AMB) (Signed)
Chronic Care Management   Note  01/16/2019 Name: Tom Moore MRN: [id number] DOB: 01-03-1946  Subjective:    Does the patient  feel that his/her medications are working for him/her?  Yes  Has the patient been experiencing any side effects to the medications prescribed?  no  Does the patient measure his/her own blood glucose at home?  yes - [employer] covers diabetic testing supplies with $0 copay  Does the patient measure his/her own blood pressure at home? no - but does check at pharmacy/grocery store when able   Does the patient have any problems obtaining medications due to transportation or finances?   Yes- Trulicity  Understanding of regimen: good Understanding of indications: good Potential of compliance: excellent  Objective: Lab Results  Component Value Date   CREATININE 1.63 (H) 10/30/2018   CREATININE 1.65 (H) 06/19/2018   CREATININE 1.72 (H) 05/12/2018    Lab Results  Component Value Date   HGBA1C 7.4 (A) 06/20/2018    Lipid Panel     Component Value Date/Time   CHOL 130 05/16/2017 0808   TRIG 174 (H) 05/16/2017 0808   HDL 42 05/16/2017 0808   CHOLHDL 3.1 05/16/2017 0808   LDLCALC 53 05/16/2017 0808    BP Readings from Last 3 Encounters:  01/01/19 122/70  12/05/18 120/80  10/30/18 (!) 90/50    Allergies  Allergen Reactions  . Zyrtec Allergy [Cetirizine] Diarrhea    Medications Reviewed Today    Reviewed by Tom Moore, CMA (Certified Medical Assistant) on 01/01/19 at [facility] List Status: <None>  Medication Order Taking? Sig Documenting Provider Last Dose Status Informant  ALPRAZolam (XANAX) 0.5 MG tablet [id number]  Take 1 tablet (0.5 mg total) by mouth 3 (three) times daily as needed for anxiety. Tom Ginsberg, PA  Active   apixaban (ELIQUIS) 5 MG TABS tablet [id number]  Take 5 mg by mouth 2 (two) times daily.  Provider, Historical, Moore  Active   atorvastatin (LIPITOR) 40 MG tablet [id number]  Take 40 mg by mouth daily. Provider,  Historical, Moore  Active   diltiazem (CARDIZEM CD) 120 MG 24 hr capsule [id number]  Take 120 mg by mouth daily.  Provider, Historical, Moore  Expired 12/05/18 2359   Dulaglutide 1.5 MG/0.5ML SOPN [id number]  Inject into the skin. Provider, Historical, Moore  Active   furosemide (LASIX) 40 MG tablet [id number]  Take 1 tablet (40 mg total) by mouth 2 (two) times daily. Tom Moore  Active   gabapentin (NEURONTIN) 400 MG capsule [id number]  Take 1 capsule (400 mg total) by mouth 2 (two) times daily. Tom Leys, Moore  Active   glipiZIDE (GLUCOTROL) 10 MG tablet [id number]  TAKE ONE TABLET BY MOUTH THREE TIMES DAILY BEFORE MEALS Tom Moore  Active   glucose blood (ONE TOUCH ULTRA TEST) test strip [id number]  Use 3 (three) times daily One touch ultra test strips e11.29 Provider, Historical, Moore  Active   insulin NPH Human (HUMULIN N,NOVOLIN N) 100 UNIT/ML injection [id number]  Take 15 units BID 12 hours apart (8 am, 8 pm approximately) *[employer]ion brand only please* Provider, Historical, Moore  Active   lisinopril (PRINIVIL,ZESTRIL) 20 MG tablet [id number]  Take 40 mg by mouth daily.  Provider, Historical, Moore  Active   Magnesium Oxide 400 MG CAPS [id number]  Take 1 capsule (400 mg total) by mouth 2 (two) times daily. Tom Moore, Moore  Active   metFORMIN (GLUCOPHAGE) 1000 MG tablet [id number]  TAKE 1 TABLET BY MOUTH TWICE DAILY  WITH A MEAL Tom Moore  Active   methimazole (TAPAZOLE) 5 MG tablet [id number]  Take 5 mg by mouth. Provider, Historical, Moore  Active   metoprolol succinate (TOPROL-XL) 100 MG 24 hr tablet [id number]  Take 200 mg by mouth daily.  Provider, Historical, Moore  Active   multivitamin-iron-minerals-folic acid (THERAPEUTIC-Moore) TABS tablet [id number]  Take 1 tablet by mouth daily. Provider, Historical, Moore  Active   omeprazole (PRILOSEC) 40 MG capsule [id number]  TAKE 1 CAPSULE(40 MG) BY MOUTH DAILY  Patient not taking:  Reported on 12/05/2018   Tom Moore, Moore  Active    omeprazole (PRILOSEC) 40 MG capsule [id number]  TAKE 1 CAPSULE BY MOUTH ONCE DAILY  Patient not taking:  Reported on 10/30/2018   Tom Moore, Moore  Active   oxybutynin (DITROPAN) 5 MG tablet [id number]  1 tablet at bedtime Tom Moore  Active   Semaglutide,0.25 or 0.5MG /DOS, 2 MG/1.5ML SOPN [id number] Yes Inject into the skin. Provider, Historical, Moore  Active   sertraline (ZOLOFT) 100 MG tablet [id number]  Take 1 tablet (100 mg total) by mouth daily. Tom Ginsberg, PA  Active   spironolactone (ALDACTONE) 25 MG tablet [id number]  TAKE 1 TABLET BY MOUTH EVERY DAY Provider, Historical, Moore  Active   tadalafil (ADCIRCA/CIALIS) 20 MG tablet [id number]  Take 1 tablet (20 mg total) by mouth daily as needed for erectile dysfunction.  Patient not taking:  Reported on 10/30/2018   Tom Moore, Moore  Active   tamsulosin [facility]) 0.4 MG CAPS capsule [id number]  TAKE 1 CAPSULE BY MOUTH ONCE DAILY Tom Moore, Moore  Active   triamcinolone cream (KENALOG) 0.1 % [id number]  Apply to left ear rash 2-3 x day as needed for itching.  Patient not taking:  Reported on 10/30/2018   Tom Moore, Moore  Active   venlafaxine XR (EFFEXOR-XR) 150 MG 24 hr capsule [id number]  TAKE 1 CAPSULE BY MOUTH ONCE DAILY WITH BREAKFAST (TAKE  WITH  75MG   FOR  A  TOTAL  DAILY  DOSE  OF  225  MG) Tom Ginsberg, PA  Active            Assessment:   #DM: well controlled; checks FBG once daily in the AM; readings in the past week range from 114 -150 mg/dL; most recent A1c 7.2%;    Goals Addressed            This Visit's Progress   . I can't afford my Trulicity (pt-stated)       Current Barriers:  . financial  Pharmacist Clinical Goal(s): Over the next 14 days, Mr.. Tom Moore will provide the necessary supplementary documents (proof of out of pocket prescription expenditure, proof of household income) needed for medication assistance applications to CCM pharmacist.   Interventions: . CCM pharmacist will apply for  medication assistance program for Trulicity made by [employer] and prescribed by Tom Moore.   Patient Self Care Activities:  Marland Kitchen Gather necessary documents needed to apply for medication assistance  Initial goal documentation         Time:  Time spent counseling patient: 23 Additional time spent on charting: 13 Review of patient status, including review of consultants reports, laboratory and other test data, was performed as part of comprehensive evaluation and provision of chronic care management services.   Plan: Recommendations discussed with provider - collaborate with Dr. Gabriel Carina on medication assistance application  Recommendations discussed with patient - Please bring 2020 Social Security benefit letter to the practice  Follow up: The CM team will reach out to the patient again over the next 3-5  days.    [name], PharmD Clinical Pharmacist [place] [phone]

## 2019-01-16 NOTE — Patient Instructions (Signed)
1. Please bring copy of Social Security benefit letter by the [facility] office for Trulicity medication assistance application.     Thank you allowing the Chronic Care Management Team to be a part of your care!   Please call a member of the CCM (Chronic Care Management) Team with any questions or case management needs:   Vanetta Mulders, BSN Nurse Care Coordinator  [phone]  [name], PharmD  Clinical Pharmacist  [phone]  Elliot Gurney, LCSW Clinical Social Worker [phone]  Goals Addressed            This Visit's Progress   . I can't afford my Trulicity (pt-stated)       Current Barriers:  . financial  Pharmacist Clinical Goal(s): Over the next 14 days, Mr.. [place] will provide the necessary supplementary documents (proof of out of pocket prescription expenditure, proof of household income) needed for medication assistance applications to CCM pharmacist.   Interventions: . CCM pharmacist will apply for medication assistance program for Trulicity made by [employer] and prescribed by Dr. Lucilla Lame.   Patient Self Care Activities:  Marland Kitchen Gather necessary documents needed to apply for medication assistance  Initial goal documentation         The patient verbalized understanding of instructions provided today and declined a print copy of patient instruction materials.

## 2019-01-17 ENCOUNTER — Ambulatory Visit: Payer: Self-pay | Admitting: Pharmacist

## 2019-01-17 ENCOUNTER — Other Ambulatory Visit: Payer: Self-pay

## 2019-01-17 DIAGNOSIS — E1122 Type 2 diabetes mellitus with diabetic chronic kidney disease: Secondary | ICD-10-CM | POA: Diagnosis not present

## 2019-01-17 DIAGNOSIS — N183 Chronic kidney disease, stage 3 unspecified: Secondary | ICD-10-CM

## 2019-01-17 DIAGNOSIS — E11649 Type 2 diabetes mellitus with hypoglycemia without coma: Secondary | ICD-10-CM | POA: Diagnosis not present

## 2019-01-17 DIAGNOSIS — F419 Anxiety disorder, unspecified: Secondary | ICD-10-CM

## 2019-01-17 NOTE — Chronic Care Management (AMB) (Signed)
  Chronic Care Management   Follow Up Note   01/17/2019 Name: Tom Moore MRN: [id number] DOB: 06-06-1946  Referred by: Jerrol Banana., MD Reason for referral : Chronic Care Management (Medication assistance)   Tom Moore is a 73 y.o. year old male who is a primary care patient of Jerrol Banana., MD. The CCM team was consulted for assistance with chronic disease management and care coordination needs.    Review of patient status, including review of consultants reports, relevant laboratory and other test results, and collaboration with appropriate care team members and the patient's provider was performed as part of comprehensive patient evaluation and provision of chronic care management services.    Assessment: #Medication assistance: Patient provided proof of household income and signed application. CCM pharmacist submitted provider portion to Dr. Lucilla Lame of [facility] and will submit completed application to [facility] once all portions of application are returned.   Goals Addressed            This Visit's Progress   . I can't afford my Trulicity (pt-stated)       Current Barriers:  . financial  Pharmacist Clinical Goal(s): Over the next 14 days, Tom Moore will provide the necessary supplementary documents (proof of out of pocket prescription expenditure, proof of household income) needed for medication assistance applications to CCM pharmacist.   Updated 01/17/19: Tom Moore provided household financial statement and signed application. CCM pharmacist submitted provider portion of application to Dr. Lucilla Lame   Interventions: . CCM pharmacist will apply for medication assistance program for Trulicity made by [employer] and prescribed by Dr. Lucilla Lame.   Patient Self Care Activities:  Marland Kitchen Gather necessary documents needed to apply for medication assistance  Please see past updates related to this goal by clicking on the "Past Updates" button in the  selected goal          Plan: Submit Trulicity application to [employer] following return of provider portion by Dr. Lucilla Lame   The CM team will reach out to the patient again over the next 7-10 days.  or sooner pending return fax to CCM pharmacist.    [name], PharmD Clinical Pharmacist [place] [phone]

## 2019-01-18 DIAGNOSIS — E1122 Type 2 diabetes mellitus with diabetic chronic kidney disease: Secondary | ICD-10-CM | POA: Diagnosis not present

## 2019-01-18 DIAGNOSIS — Z794 Long term (current) use of insulin: Secondary | ICD-10-CM | POA: Diagnosis not present

## 2019-01-18 DIAGNOSIS — N183 Chronic kidney disease, stage 3 (moderate): Secondary | ICD-10-CM | POA: Diagnosis not present

## 2019-01-23 DIAGNOSIS — E0590 Thyrotoxicosis, unspecified without thyrotoxic crisis or storm: Secondary | ICD-10-CM | POA: Diagnosis not present

## 2019-01-23 DIAGNOSIS — Z794 Long term (current) use of insulin: Secondary | ICD-10-CM | POA: Diagnosis not present

## 2019-01-23 DIAGNOSIS — N183 Chronic kidney disease, stage 3 (moderate): Secondary | ICD-10-CM | POA: Diagnosis not present

## 2019-01-23 DIAGNOSIS — E1122 Type 2 diabetes mellitus with diabetic chronic kidney disease: Secondary | ICD-10-CM | POA: Diagnosis not present

## 2019-01-24 ENCOUNTER — Telehealth: Payer: Self-pay

## 2019-01-24 ENCOUNTER — Ambulatory Visit: Payer: Self-pay | Admitting: Pharmacist

## 2019-01-24 DIAGNOSIS — E1122 Type 2 diabetes mellitus with diabetic chronic kidney disease: Secondary | ICD-10-CM

## 2019-01-24 DIAGNOSIS — N183 Chronic kidney disease, stage 3 unspecified: Secondary | ICD-10-CM

## 2019-01-24 NOTE — Chronic Care Management (AMB) (Signed)
  Chronic Care Management   Follow Up Note   01/24/2019 Name: RUPERT AZZARA MRN: [id number] DOB: May 22, 1946  Referred by: Jerrol Banana., MD Reason for referral : Care Coordination (Trulcity medication assistance)   SYDNEY AZURE is a 73 y.o. year old male who is a primary care patient of Jerrol Banana., MD. The CCM team was consulted for assistance with chronic disease management and care coordination needs.    Review of patient status, including review of consultants reports, relevant laboratory and other test results, and collaboration with appropriate care team members and the patient's provider was performed as part of comprehensive patient evaluation and provision of chronic care management services.    Goals Addressed            This Visit's Progress   . I can't afford my Trulicity (pt-stated)   On track    Current Barriers:  . financial  Pharmacist Clinical Goal(s): Over the next 14 days, Mr.. [place] will provide the necessary supplementary documents (proof of out of pocket prescription expenditure, proof of household income) needed for medication assistance applications to CCM pharmacist.   Updated 01/17/19: Mr. Ericksen provided household financial statement and signed application. CCM pharmacist submitted provider portion of application to Dr. Lucilla Lame    Updated 01/24/19: Received fax from Dr. Lucilla Lame with provider portion of [facility] application. Submitted completed application to [employer] and uploaded to media tab.  Interventions: . CCM pharmacist will apply for medication assistance program for Trulicity made by [employer] and prescribed by Dr. Lucilla Lame.   Patient Self Care Activities:  Marland Kitchen Gather necessary documents needed to apply for medication assistance  Please see past updates related to this goal by clicking on the "Past Updates" button in the selected goal           Telephone follow up appointment with CCM team member scheduled  for: 1 week, follow up with [employer] and [facility].   [name], PharmD Clinical Pharmacist [place] [phone]

## 2019-01-25 ENCOUNTER — Ambulatory Visit: Payer: Self-pay | Admitting: Family Medicine

## 2019-01-26 ENCOUNTER — Ambulatory Visit: Payer: Self-pay | Admitting: *Deleted

## 2019-01-26 DIAGNOSIS — F329 Major depressive disorder, single episode, unspecified: Secondary | ICD-10-CM

## 2019-01-26 DIAGNOSIS — F419 Anxiety disorder, unspecified: Secondary | ICD-10-CM

## 2019-01-26 NOTE — Chronic Care Management (AMB) (Signed)
  Chronic Care Management    Clinical Social Work Follow Up Note  01/26/2019 Name: Tom Moore MRN: [id number] DOB: [birth year]  Tom Moore is a 73 y.o. year old male who is a primary care patient of Jerrol Banana., MD. The CCM team was consulted for assistance with Mental Health Counseling and Resources.   Patient discussed improvement in mood since the start of new medication. Patient able to verbalize positive coping strategies to manage depression. Patient agreeable to referral for ongoing mental health counseling.  Review of patient status, including review of consultants reports, other relevant assessments, and collaboration with appropriate care team members and the patient's provider was performed as part of comprehensive patient evaluation and provision of chronic care management services.     Goals Addressed            This Visit's Progress   . " I would like to feel better (pt-stated)       Current Barriers:  . Financial constraints . Mental Health Concerns   Clinical Social Work Clinical Goal(s):  Marland Kitchen Over the next 30  days, client will work with SW to address concerns related to his symptoms of depression  Interventions: . Patient interviewed and appropriate assessments performed . Continued to provide mental health counseling and emotional support with regard to patient's symptoms of depression  . Emphasized self care and utilizing healthy coping . Discussed plans with patient for ongoing care management follow up and provided patient with direct contact information for care management team . Advised patient on status of referral that will be made for a psychiatric consult   Patient Self Care Activities:  . Self administers medications as prescribed . Attends all scheduled provider appointments . Performs ADL's independently . Performs IADL's independently  Please see past updates related to this goal by clicking on the "Past Updates" button in the  selected goal          Follow Up Plan: SW will follow up with patient by phone over the next 2 weeks regardng referral for ongoing mental health follow up    Elliot Gurney, [place] Worker  [place] [phone]

## 2019-01-26 NOTE — Patient Instructions (Signed)
Thank you allowing the Chronic Care Management Team to be a part of your care! It was a pleasure speaking with you today!  1. Please continue to utilize healthy coping to manage depression symptoms. 2. Please continue to engage with this social worker if there are any questions, concerns, or needs.     CCM (Chronic Care Management) Team   Trish Fountain RN, BSN Nurse Care Coordinator  [phone]  [name] PharmD  Clinical Pharmacist  [phone]   [place], LCSW Clinical Social Worker [phone]  Goals Addressed            This Visit's Progress   . " I would like to feel better (pt-stated)       Current Barriers:  . Financial constraints . Mental Health Concerns   Clinical Social Work Clinical Goal(s):  Marland Kitchen Over the next 30  days, client will work with SW to address concerns related to his symptoms of depression  Interventions: . Patient interviewed and appropriate assessments performed . Continued to provide mental health counseling and emotional support with regard to patient's symptoms of depression  . Emphasized self care and utilizing healthy coping . Discussed plans with patient for ongoing care management follow up and provided patient with direct contact information for care management team . Advised patient on status of referral that will be made for a psychiatric consult   Patient Self Care Activities:  . Self administers medications as prescribed . Attends all scheduled provider appointments . Performs ADL's independently . Performs IADL's independently  Please see past updates related to this goal by clicking on the "Past Updates" button in the selected goal          The patient verbalized understanding of instructions provided today and declined a print copy of patient instruction materials.   The CM team will reach out to the patient again over the next 14  days.

## 2019-01-31 ENCOUNTER — Ambulatory Visit: Payer: Self-pay | Admitting: Pharmacist

## 2019-01-31 ENCOUNTER — Other Ambulatory Visit: Payer: Self-pay

## 2019-01-31 ENCOUNTER — Encounter: Payer: Self-pay | Admitting: Family Medicine

## 2019-01-31 ENCOUNTER — Ambulatory Visit (INDEPENDENT_AMBULATORY_CARE_PROVIDER_SITE_OTHER): Payer: PPO | Admitting: Family Medicine

## 2019-01-31 VITALS — BP 118/70 | HR 100 | Temp 97.8°F | Resp 16 | Ht 69.0 in | Wt 217.0 lb

## 2019-01-31 DIAGNOSIS — G4733 Obstructive sleep apnea (adult) (pediatric): Secondary | ICD-10-CM | POA: Diagnosis not present

## 2019-01-31 DIAGNOSIS — F329 Major depressive disorder, single episode, unspecified: Principal | ICD-10-CM

## 2019-01-31 DIAGNOSIS — F324 Major depressive disorder, single episode, in partial remission: Secondary | ICD-10-CM

## 2019-01-31 DIAGNOSIS — E1122 Type 2 diabetes mellitus with diabetic chronic kidney disease: Secondary | ICD-10-CM | POA: Diagnosis not present

## 2019-01-31 DIAGNOSIS — N183 Chronic kidney disease, stage 3 unspecified: Secondary | ICD-10-CM

## 2019-01-31 NOTE — Chronic Care Management (AMB) (Signed)
  Chronic Care Management   Follow Up Note   01/31/2019 Name: Tom Moore MRN: [id number] DOB: [birth year]  Referred by: Jerrol Banana., MD Reason for referral : Chronic Care Management (Trulicity follow up)   Tom Moore is a 73 y.o. year old male who is a primary care patient of Jerrol Banana., MD. The CCM team was consulted for assistance with chronic disease management and care coordination needs.    Review of patient status, including review of consultants reports, relevant laboratory and other test results, and collaboration with appropriate care team members and the patient's provider was performed as part of comprehensive patient evaluation and provision of chronic care management services.    Medication Assistance: Patient is has been approved for Trulicity made by [employer] and prescribed by Dr. Lucilla Lame. Approved 01/28/2019 and expires 09/27/2019.   Patient should schedule medication delivery to home via mail by calling [facility] [phone]).   Goals Addressed            This Visit's Progress   . COMPLETED: I can't afford my Trulicity (pt-stated)       Current Barriers:  . financial  Pharmacist Clinical Goal(s): Over the next 14 days, Tom Moore will provide the necessary supplementary documents (proof of out of pocket prescription expenditure, proof of household income) needed for medication assistance applications to CCM pharmacist.   Updated 01/17/19: Tom Moore provided household financial statement and signed application. CCM pharmacist submitted provider portion of application to Dr. Lucilla Lame    Updated 01/24/19: Received fax from Dr. Lucilla Lame with provider portion of [facility] application. Submitted completed application to [employer] and uploaded to media tab.  Interventions: . CCM pharmacist will apply for medication assistance program for Trulicity made by [employer] and prescribed by Dr. Lucilla Lame.   Patient Self Care Activities:  Marland Kitchen  Gather necessary documents needed to apply for medication assistance  Please see past updates related to this goal by clicking on the "Past Updates" button in the selected goal           The patient will call Dr. Gabriel Carina* as advised to when due for refills as new prescription will need to be sent in..  The patient has been provided with contact information for the chronic care management team and has been advised to call with any health related questions or concerns.    [name], PharmD Clinical Pharmacist [place] [phone]

## 2019-01-31 NOTE — Progress Notes (Signed)
Patient: Tom Moore Male    DOB: 1945-11-24   73 y.o.   MRN: [id number] Visit Date: 01/31/2019  Today's Provider: [name], MD   Chief Complaint  Patient presents with  . Depression   Subjective:     HPI  Patient comes in today for a follow up. He was last seen in the office 1 month ago. He was started on bupropion 75mg  twice daily. He reports that he can tell a big improvement in his mood.  He is also getting help from CCM. He scores a 9 on Epworth today. Depression screen [facility] 2/9 01/31/2019 01/03/2019 12/25/2018  Decreased Interest 2 3 2   Down, Depressed, Hopeless 1 3 2   PHQ - 2 Score 3 6 4   Altered sleeping 2 3 3   Tired, decreased energy 1 3 3   Change in appetite 2 2 2   Feeling bad or failure about yourself  1 2 2   Trouble concentrating 1 3 3   Moving slowly or fidgety/restless 1 1 1   Suicidal thoughts 0 0 0  PHQ-9 Score 11 20 18   Difficult doing work/chores Not difficult at all Very difficult Very difficult   Patient also mentions that he has trouble sleeping with excessive sleepiness. He has had a CPAP before in the past, but he was unable to sleep with it nightly due to comfort.  Results of the Epworth flowsheet 01/31/2019  Sitting and reading 0  Watching TV 3  Sitting, inactive in a public place (e.g. a theatre or a meeting) 3  As a passenger in a car for an hour without a break 3  Lying down to rest in the afternoon when circumstances permit 0  Sitting and talking to someone 0  Sitting quietly after a lunch without alcohol 0  In a car, while stopped for a few minutes in traffic 0  Total score 9     Allergies  Allergen Reactions  . Zyrtec Allergy [Cetirizine] Diarrhea     Current Outpatient Medications:  .  ALPRAZolam (XANAX) 0.5 MG tablet, Take 1 tablet (0.5 mg total) by mouth 3 (three) times daily as needed for anxiety., Disp: 12 tablet, Rfl: 0 .  apixaban (ELIQUIS) 5 MG TABS tablet, Take 5 mg by mouth 2 (two) times daily. , Disp: , Rfl:  .   atorvastatin (LIPITOR) 40 MG tablet, Take 40 mg by mouth daily., Disp: , Rfl:  .  buPROPion (WELLBUTRIN) 75 MG tablet, Take 1 tablet (75 mg total) by mouth 2 (two) times daily., Disp: 60 tablet, Rfl: 5 .  Dulaglutide 1.5 MG/0.5ML SOPN, Inject into the skin., Disp: , Rfl:  .  furosemide (LASIX) 40 MG tablet, Take 1 tablet (40 mg total) by mouth 2 (two) times daily., Disp: 180 tablet, Rfl: 1 .  gabapentin (NEURONTIN) 400 MG capsule, Take 1 capsule (400 mg total) by mouth 2 (two) times daily., Disp: 60 capsule, Rfl: 3 .  glipiZIDE (GLUCOTROL) 10 MG tablet, TAKE ONE TABLET BY MOUTH THREE TIMES DAILY BEFORE MEALS, Disp: 270 tablet, Rfl: 1 .  glucose blood (ONE TOUCH ULTRA TEST) test strip, Use 3 (three) times daily One touch ultra test strips e11.29, Disp: , Rfl:  .  insulin NPH Human (HUMULIN N,NOVOLIN N) 100 UNIT/ML injection, Take 15 units BID 12 hours apart (8 am, 8 pm approximately) *Relion brand only please*, Disp: , Rfl:  .  lisinopril (PRINIVIL,ZESTRIL) 20 MG tablet, Take 40 mg by mouth daily. , Disp: , Rfl: 3 .  Magnesium Oxide 400 MG CAPS, Take 1 capsule (400 mg total) by mouth 2 (two) times daily., Disp: 180 capsule, Rfl: 3 .  metFORMIN (GLUCOPHAGE) 1000 MG tablet, TAKE 1 TABLET BY MOUTH TWICE DAILY WITH A MEAL, Disp: 180 tablet, Rfl: 0 .  methimazole (TAPAZOLE) 5 MG tablet, Take 5 mg by mouth., Disp: , Rfl:  .  metoprolol succinate (TOPROL-XL) 100 MG 24 hr tablet, Take 200 mg by mouth daily. , Disp: , Rfl: 0 .  multivitamin-iron-minerals-folic acid (THERAPEUTIC-M) TABS tablet, Take 1 tablet by mouth daily., Disp: , Rfl:  .  omeprazole (PRILOSEC) 40 MG capsule, TAKE 1 CAPSULE(40 MG) BY MOUTH DAILY, Disp: 90 capsule, Rfl: 0 .  omeprazole (PRILOSEC) 40 MG capsule, TAKE 1 CAPSULE BY MOUTH ONCE DAILY, Disp: 90 capsule, Rfl: 1 .  oxybutynin (DITROPAN) 5 MG tablet, 1 tablet at bedtime, Disp: 30 tablet, Rfl: 11 .  sertraline (ZOLOFT) 100 MG tablet, Take 1 tablet (100 mg total) by mouth daily., Disp:  90 tablet, Rfl: 1 .  spironolactone (ALDACTONE) 25 MG tablet, TAKE 1 TABLET BY MOUTH EVERY DAY, Disp: , Rfl:  .  tadalafil (ADCIRCA/CIALIS) 20 MG tablet, Take 1 tablet (20 mg total) by mouth daily as needed for erectile dysfunction., Disp: 10 tablet, Rfl: 0 .  tamsulosin (FLOMAX) 0.4 MG CAPS capsule, TAKE 1 CAPSULE BY MOUTH ONCE DAILY, Disp: 90 capsule, Rfl: 1 .  triamcinolone cream (KENALOG) 0.1 %, Apply to left ear rash 2-3 x day as needed for itching., Disp: 30 g, Rfl: 1 .  venlafaxine XR (EFFEXOR-XR) 150 MG 24 hr capsule, TAKE 1 CAPSULE BY MOUTH ONCE DAILY WITH BREAKFAST (TAKE  WITH  75MG   FOR  A  TOTAL  DAILY  DOSE  OF  225  MG), Disp: 90 capsule, Rfl: 1 .  diltiazem (CARDIZEM CD) 120 MG 24 hr capsule, Take 120 mg by mouth daily. , Disp: , Rfl:   Review of Systems  Constitutional: Negative for activity change, appetite change, chills, diaphoresis, fatigue, fever and unexpected weight change.  Eyes: Negative.   Respiratory: Negative for cough and shortness of breath.   Cardiovascular: Negative.   Gastrointestinal: Negative.   Endocrine: Negative.   Allergic/Immunologic: Negative.   Neurological: Negative for dizziness, light-headedness and headaches.  Psychiatric/Behavioral: Negative for agitation, confusion, decreased concentration, hallucinations, self-injury, sleep disturbance and suicidal ideas. The patient is not nervous/anxious and is not hyperactive.     Social History   Tobacco Use  . Smoking status: Former Smoker    Last attempt to quit: 04/03/1988    Years since quitting: 30.8  . Smokeless tobacco: Never Used  Substance Use Topics  . Alcohol use: Not Currently    Comment: Ocassional alcohol use, Drinks wine.      Objective:   BP 118/70   Pulse 100   Temp 97.8 F (36.6 C)   Resp 16   Ht 5\' 9"  (1.753 m)   Wt 217 lb (98.4 kg)   SpO2 99%   BMI 32.05 kg/m  Vitals:   01/31/19 1412  BP: 118/70  Pulse: 100  Resp: 16  Temp: 97.8 F (36.6 C)  SpO2: 99%  Weight:  217 lb (98.4 kg)  Height: 5\' 9"  (1.753 m)     Physical Exam Vitals signs reviewed.  Constitutional:      Appearance: Normal appearance.  HENT:     Head: Normocephalic and atraumatic.     Right Ear: External ear normal.     Left Ear: External ear normal.  Nose: Nose normal.     Mouth/Throat:     Pharynx: Oropharynx is clear.  Eyes:     General: No scleral icterus. Cardiovascular:     Rate and Rhythm: Normal rate and regular rhythm.     Pulses: Normal pulses.     Heart sounds: Normal heart sounds.  Pulmonary:     Effort: Pulmonary effort is normal.     Breath sounds: Normal breath sounds.  Abdominal:     General: Abdomen is flat.     Palpations: Abdomen is soft.  Musculoskeletal:     Right lower leg: No edema.     Left lower leg: No edema.  Skin:    General: Skin is dry.  Neurological:     General: No focal deficit present.     Mental Status: He is alert and oriented to person, place, and time. Mental status is at baseline.  Psychiatric:        Mood and Affect: Mood normal.        Behavior: Behavior normal.        Thought Content: Thought content normal.        Judgment: Judgment normal.         Assessment & Plan    1. Major depressive disorder with single episode, remission status unspecified In partial remission  2. Controlled type 2 diabetes mellitus with stage 3 chronic kidney disease, unspecified whether long term insulin use (HCC) RTC 2-3 months  3. OSA (obstructive sleep apnea) Split night sleep study with BiPap titration as pt has been intolerant to CPAP. - Ambulatory referral to Sleep Studies     [name], MD  [facility]

## 2019-01-31 NOTE — Patient Instructions (Signed)
Goals Addressed            This Visit's Progress   . COMPLETED: I can't afford my Trulicity (pt-stated)       Current Barriers:  . financial  Pharmacist Clinical Goal(s): Over the next 14 days, Tom Moore will provide the necessary supplementary documents (proof of out of pocket prescription expenditure, proof of household income) needed for medication assistance applications to CCM pharmacist.   Updated 01/17/19: Tom Moore provided household financial statement and signed application. CCM pharmacist submitted provider portion of application to Dr. Lucilla Lame    Updated 01/24/19: Received fax from Dr. Lucilla Lame with provider portion of [facility] application. Submitted completed application to [employer] and uploaded to media tab.  Interventions: . CCM pharmacist will apply for medication assistance program for Trulicity made by [employer] and prescribed by Dr. Lucilla Lame.   Patient Self Care Activities:  Marland Kitchen Gather necessary documents needed to apply for medication assistance  Please see past updates related to this goal by clicking on the "Past Updates" button in the selected goal           Thank you allowing the Chronic Care Management Team to be a part of your care!   Please call a member of the CCM (Chronic Care Management) Team with any questions or case management needs:   Vanetta Mulders, BSN Nurse Care Coordinator  [phone]  [name], PharmD  Clinical Pharmacist  [phone]  Elliot Gurney, [place] Clinical Social Worker [phone]

## 2019-02-09 ENCOUNTER — Ambulatory Visit (INDEPENDENT_AMBULATORY_CARE_PROVIDER_SITE_OTHER): Payer: PPO | Admitting: *Deleted

## 2019-02-09 ENCOUNTER — Encounter: Payer: Self-pay | Admitting: *Deleted

## 2019-02-09 DIAGNOSIS — F324 Major depressive disorder, single episode, in partial remission: Secondary | ICD-10-CM | POA: Diagnosis not present

## 2019-02-09 DIAGNOSIS — F419 Anxiety disorder, unspecified: Secondary | ICD-10-CM

## 2019-02-09 NOTE — Patient Instructions (Signed)
Thank you allowing the Chronic Care Management Team to be a part of your care! It was a pleasure speaking with you today!  1. Please continue to utilize positive coping strategies for depression. 2. Please contact this CCM social worker with any questions or concerns  CCM (Chronic Care Management) Team   Trish Fountain RN, BSN Nurse Care Coordinator  [phone]  [name] PharmD  Clinical Pharmacist  [phone]   [place], LCSW Clinical Social Worker [phone]  Goals Addressed            This Visit's Progress   . " I would like to feel better (pt-stated)       Current Barriers:  . Financial constraints . Mental Health Concerns   Clinical Social Work Clinical Goal(s):  Marland Kitchen Over the next 30  days, client will work with SW to address concerns related to his symptoms of depression  Interventions: . Patient interviewed and appropriate assessments performed . Continued to provide mental health counseling and emotional support with regard to patient's symptoms of depression  . Emphasized self care and utilizing healthy coping strategies for depression . Discussed plans with patient for ongoing care management follow up and provided patient with direct contact information for care management team . Advised patient on status of referral for outpatient therapy, patient denied need for outpatient referral at this time   Patient Self Care Activities:  . Self administers medications as prescribed . Attends all scheduled provider appointments . Performs ADL's independently . Performs IADL's independently  Please see past updates related to this goal by clicking on the "Past Updates" button in the selected goal          The patient verbalized understanding of instructions provided today and declined a print copy of patient instruction materials.   The CM team will reach out to the patient again over the next 30 days.

## 2019-02-09 NOTE — Chronic Care Management (AMB) (Signed)
  Chronic Care Management    Clinical Social Work Follow Up Note  02/09/2019 Name: Tom Moore MRN: [id number] DOB: 1945-09-29  Tom Moore [place] is a 73 y.o. year old male who is a primary care patient of Tom Moore., MD. The CCM team was consulted for assistance with Mental Health Counseling and Resources.   Social Determinants of Health screening completed indicating no risk areas at this time.  Review of patient status, including review of consultants reports, other relevant assessments, and collaboration with appropriate care team members and the patient's provider was performed as part of comprehensive patient evaluation and provision of chronic care management services.     Goals Addressed            This Visit's Progress   . " I would like to feel better (pt-stated)       Phone call today to patient to follow up on his symptoms of depression. Patient continues to report an improvement in mood over time. Patient describes having more energy and motivation to walk daily and interact with family. Patient discussed a relief in his financial stress due to his  approval for Trulicity. Patient denied thoughts of self harm and denied need for referral to outpatient mental health follow up.  Current Barriers:  . Financial constraints . Mental Health Concerns   Clinical Social Work Clinical Goal(s):  Marland Kitchen Over the next 30  days, client will work with SW to address concerns related to his symptoms of depression  Interventions: . Patient interviewed and appropriate assessments performed . Continued to provide mental health counseling and emotional support with regard to patient's symptoms of depression  . Emphasized self care and utilizing healthy coping strategies for depression . Discussed plans with patient for ongoing care management follow up and provided patient with direct contact information for care management team . Advised patient on status of referral for outpatient  therapy, patient denied need for outpatient referral at this time   Patient Self Care Activities:  . Self administers medications as prescribed . Attends all scheduled provider appointments . Performs ADL's independently . Performs IADL's independently  Please see past updates related to this goal by clicking on the "Past Updates" button in the selected goal          Follow Up Plan: SW will follow up with patient by phone over the next 30 days    [name], [place] Worker  [facility] [phone]

## 2019-02-13 ENCOUNTER — Telehealth: Payer: Self-pay

## 2019-02-13 NOTE — Telephone Encounter (Signed)
Sounds like a cardiac arrhythmia, he needs to go to the ER.

## 2019-02-13 NOTE — Telephone Encounter (Signed)
Patient contacted nurse line when our office was closed for lunch and reported that he had a syncope episode. When I spoke with patient he states that he was at his sisters job and was sitting in a chair when he began to feel dizzy. Patient reports that when he stood it became more intense and states that he passed out onto the floor and was unconscious for less than a minute. Patient states that he felt fine after coming to and dizziness subsided. Patient denied hitting his head or symptoms, of URI, nausea, vomiting, fever, decrease appetite, weakness, blurred vision/speech numbness or tingling of his extremities. I told patient he should be evaluated by physician but there were not any more openings today. Patient declined going to urgent care and he states he refuses to go to ED. Please advise.

## 2019-02-14 NOTE — Telephone Encounter (Signed)
I spoke with patient and advised him as below. Patient refuses to go to the ER. He says he is laying in bed right now and feels fine. Patient says he would reach out to his cardiologist Dr. Clayborn Bigness and see what he recommends. I then contacted Dr. Marianna Payment office and left a message for his nurse to try reaching out to patient.

## 2019-02-15 DIAGNOSIS — R809 Proteinuria, unspecified: Secondary | ICD-10-CM | POA: Diagnosis not present

## 2019-02-15 DIAGNOSIS — I5022 Chronic systolic (congestive) heart failure: Secondary | ICD-10-CM | POA: Diagnosis not present

## 2019-02-15 DIAGNOSIS — G4733 Obstructive sleep apnea (adult) (pediatric): Secondary | ICD-10-CM | POA: Diagnosis not present

## 2019-02-15 DIAGNOSIS — E669 Obesity, unspecified: Secondary | ICD-10-CM | POA: Diagnosis not present

## 2019-02-15 DIAGNOSIS — I10 Essential (primary) hypertension: Secondary | ICD-10-CM | POA: Diagnosis not present

## 2019-02-15 DIAGNOSIS — E1129 Type 2 diabetes mellitus with other diabetic kidney complication: Secondary | ICD-10-CM | POA: Diagnosis not present

## 2019-02-15 DIAGNOSIS — I4891 Unspecified atrial fibrillation: Secondary | ICD-10-CM | POA: Diagnosis not present

## 2019-02-15 DIAGNOSIS — R0600 Dyspnea, unspecified: Secondary | ICD-10-CM | POA: Diagnosis not present

## 2019-02-15 DIAGNOSIS — N529 Male erectile dysfunction, unspecified: Secondary | ICD-10-CM | POA: Diagnosis not present

## 2019-02-15 DIAGNOSIS — R55 Syncope and collapse: Secondary | ICD-10-CM | POA: Diagnosis not present

## 2019-02-15 DIAGNOSIS — E782 Mixed hyperlipidemia: Secondary | ICD-10-CM | POA: Diagnosis not present

## 2019-02-15 DIAGNOSIS — E6609 Other obesity due to excess calories: Secondary | ICD-10-CM | POA: Diagnosis not present

## 2019-02-20 ENCOUNTER — Other Ambulatory Visit: Payer: Self-pay | Admitting: Family Medicine

## 2019-02-20 MED ORDER — VENLAFAXINE HCL ER 150 MG PO CP24: ORAL_CAPSULE | ORAL | 1 refills | Status: DC

## 2019-02-20 NOTE — Telephone Encounter (Signed)
Please advise refill? 

## 2019-02-20 NOTE — Telephone Encounter (Signed)
[facility] faxed refill request for the following medications:  venlafaxine XR (EFFEXOR-XR) 150 MG 24 hr capsule  90 day supply  Dr. Rosanna Randy is out of the office can another provider review request? Please advise. Thanks TNP

## 2019-03-01 DIAGNOSIS — E1129 Type 2 diabetes mellitus with other diabetic kidney complication: Secondary | ICD-10-CM | POA: Diagnosis not present

## 2019-03-01 DIAGNOSIS — I5022 Chronic systolic (congestive) heart failure: Secondary | ICD-10-CM | POA: Diagnosis not present

## 2019-03-01 DIAGNOSIS — E669 Obesity, unspecified: Secondary | ICD-10-CM | POA: Diagnosis not present

## 2019-03-01 DIAGNOSIS — G4733 Obstructive sleep apnea (adult) (pediatric): Secondary | ICD-10-CM | POA: Diagnosis not present

## 2019-03-01 DIAGNOSIS — Z794 Long term (current) use of insulin: Secondary | ICD-10-CM | POA: Diagnosis not present

## 2019-03-01 DIAGNOSIS — R55 Syncope and collapse: Secondary | ICD-10-CM | POA: Diagnosis not present

## 2019-03-01 DIAGNOSIS — E782 Mixed hyperlipidemia: Secondary | ICD-10-CM | POA: Diagnosis not present

## 2019-03-01 DIAGNOSIS — R809 Proteinuria, unspecified: Secondary | ICD-10-CM | POA: Diagnosis not present

## 2019-03-01 DIAGNOSIS — I10 Essential (primary) hypertension: Secondary | ICD-10-CM | POA: Diagnosis not present

## 2019-03-01 DIAGNOSIS — R0600 Dyspnea, unspecified: Secondary | ICD-10-CM | POA: Diagnosis not present

## 2019-03-01 DIAGNOSIS — I4891 Unspecified atrial fibrillation: Secondary | ICD-10-CM | POA: Diagnosis not present

## 2019-03-14 ENCOUNTER — Ambulatory Visit: Payer: Self-pay | Admitting: *Deleted

## 2019-03-14 DIAGNOSIS — F329 Major depressive disorder, single episode, unspecified: Secondary | ICD-10-CM

## 2019-03-14 DIAGNOSIS — F419 Anxiety disorder, unspecified: Secondary | ICD-10-CM

## 2019-03-14 NOTE — Patient Instructions (Signed)
Thank you allowing the Chronic Care Management Team to be a part of your care! It was a pleasure speaking with you today!  1. Please call this social worker if there are any mental health or community resource needs in the future.    CCM (Chronic Care Management) Team   Trish Fountain RN, BSN Nurse Care Coordinator  [phone]  [name] PharmD  Clinical Pharmacist  [phone]   [place], LCSW Clinical Social Worker [phone]  Goals Addressed            This Visit's Progress   . " I would like to feel better (pt-stated)       Current Barriers:  . Financial constraints . Mental Health Concerns   Clinical Social Work Clinical Goal(s):  Marland Kitchen Over the next 30  days, client will work with SW to address concerns related to his symptoms of depression  Interventions: . Patient interviewed and appropriate assessments performed . Continued to provide mental health counseling and emotional support with regard to patient's symptoms of depression  . Patient continues to report no side affects with the current anti-depressant prescribed and feels that the medication has been effective . Continued to emphasized self care and utilizing healthy coping strategies for depression . Discussed plans with patient for ongoing care management follow up and provided patient with direct contact information for care management team . Patient continues to deny need for outpatient therapy referral at this time   Patient Self Care Activities:  . Self administers medications as prescribed . Attends all scheduled provider appointments . Performs ADL's independently . Performs IADL's independently  Please see past updates related to this goal by clicking on the "Past Updates" button in the selected goal          The patient verbalized understanding of instructions provided today and declined a print copy of patient instruction materials.   No further follow up required:  patient to contact this social worker with any mental health or community resource needs in the future

## 2019-03-14 NOTE — Chronic Care Management (AMB) (Signed)
  Chronic Care Management    Clinical Social Work Follow Up Note  03/14/2019 Name: Tom Moore MRN: [id number] DOB: [birth year]  Tom Moore is a 73 y.o. year old male who is a primary care patient of Jerrol Banana., MD. The CCM team was consulted for assistance with Mental Health Counseling and Resources.   Review of patient status, including review of consultants reports, other relevant assessments, and collaboration with appropriate care team members and the patient's provider was performed as part of comprehensive patient evaluation and provision of chronic care management services.     Goals    . " I would like to feel better (pt-stated)     Current Barriers:  . Financial constraints . Mental Health Concerns   Clinical Social Work Clinical Goal(s):  Marland Kitchen Over the next 30  days, client will work with SW to address concerns related to his symptoms of depression  Interventions: . Patient interviewed and appropriate assessments performed . Continued to provide mental health counseling and emotional support with regard to patient's symptoms of depression  . Patient continues to report no side affects with the current anti-depressant prescribed and feels that the medication has been effective . Continued to emphasized self care and utilizing healthy coping strategies for depression . Discussed plans with patient for ongoing care management follow up and provided patient with direct contact information for care management team . Patient continues to deny need for outpatient therapy referral at this time   Patient Self Care Activities:  . Self administers medications as prescribed . Attends all scheduled provider appointments . Performs ADL's independently . Performs IADL's independently  Please see past updates related to this goal by clicking on the "Past Updates" button in the selected goal          Follow Up Plan: Client will contact this social worker in the  future if needed   [employer], [place] Worker  [facility] [phone]

## 2019-03-26 ENCOUNTER — Ambulatory Visit (INDEPENDENT_AMBULATORY_CARE_PROVIDER_SITE_OTHER): Payer: PPO | Admitting: Pharmacist

## 2019-03-26 DIAGNOSIS — N183 Chronic kidney disease, stage 3 unspecified: Secondary | ICD-10-CM

## 2019-03-26 DIAGNOSIS — E1122 Type 2 diabetes mellitus with diabetic chronic kidney disease: Secondary | ICD-10-CM | POA: Diagnosis not present

## 2019-03-26 NOTE — Patient Instructions (Signed)
Thank you for taking the time to speak with me today!    I am glad the Trulicity is working so well for you. Remember you have one more refill before the program expires at the end of the year.   Please call a member of the CCM (Chronic Care Management) Team with any questions or case management needs:   Vanetta Mulders, BSN Nurse Care Coordinator  [phone]  [name], PharmD  Clinical Pharmacist  [phone]  [place], LCSW Clinical Social Worker [phone]  The patient verbalized understanding of instructions provided today and declined a print copy of patient instruction materials.

## 2019-03-26 NOTE — Chronic Care Management (AMB) (Signed)
  Chronic Care Management   Note  03/26/2019 Name: HAZEN BRUMETT MRN: [id number] DOB: 28-Dec-1945  Contacted Mr. Dreden Rivere to follow up on Trulicity today. HIPAA identifiers verified.  Mr. Burgeson states that Trulicity is working very well for him. He is injecting every Monday. He experienced some gas and nausea the first couple of weeks but it has now subsided. His BG is well controlled.   Goals Addressed            This Visit's Progress   . COMPLETED: I can't afford my Trulicity (pt-stated)       Current Barriers:  . financial  Pharmacist Clinical Goal(s): Over the next 14 days, Mr.. Dion will provide the necessary supplementary documents (proof of out of pocket prescription expenditure, proof of household income) needed for medication assistance applications to CCM pharmacist.   Updated 01/17/19: Mr. Honaker provided household financial statement and signed application. CCM pharmacist submitted provider portion of application to Dr. Lucilla Lame    Updated 01/24/19: Received fax from Dr. Lucilla Lame with provider portion of [facility] application. Submitted completed application to [employer] and uploaded to media tab.  Updated 03/26/19: Remember to use your refill before the end of the year!   Interventions: . CCM pharmacist will apply for medication assistance program for Trulicity made by [employer] and prescribed by Dr. Lucilla Lame.   Patient Self Care Activities:  Marland Kitchen Gather necessary documents needed to apply for medication assistance  Please see past updates related to this goal by clicking on the "Past Updates" button in the selected goal          Follow up plan: The patient has been provided with contact information for the care management team and has been advised to call with any health related questions or concerns.   [name], PharmD Clinical Pharmacist [place] [phone]

## 2019-03-27 NOTE — Progress Notes (Signed)
This encounter was created in error - please disregard.

## 2019-03-31 DIAGNOSIS — I951 Orthostatic hypotension: Secondary | ICD-10-CM | POA: Diagnosis not present

## 2019-03-31 DIAGNOSIS — R55 Syncope and collapse: Secondary | ICD-10-CM | POA: Diagnosis not present

## 2019-04-05 ENCOUNTER — Telehealth: Payer: Self-pay

## 2019-04-05 NOTE — Telephone Encounter (Signed)
Advised patient. Appt scheduled.  

## 2019-04-05 NOTE — Telephone Encounter (Signed)
As long as he is having no other symptoms, ok to be seen tomorrow.  Would prefer in office as he may need EKG and labs and orthostatic VSs.

## 2019-04-05 NOTE — Telephone Encounter (Signed)
Patient called saying that he passed out last night while in his kitchen. He reports that he became dizzy all of a sudden and woke up on the floor. He was not doing anything strenuous. He was getting something out of the refrigerator.  He does not know how long he was out. He reports that he did not hit his head. He does have a bruise on his arm. He reports that he checked his BP right after his episode and it was 112/78. He reports that he slept fine and had no other issues.  He reports that he is still feeling lightheaded this morning. His BP currently is 118/82. He denies having any chest pain, blurred vision, nausea, upper back pain, numbness and tingling in his extremities, or a severe headache. He has taken his maintenance meds for this morning. There are no openings in the office today. Can patient wait to be seen tomorrow (in office/virtural) or does he need to be evaluated today at the ER? Please advise. Thanks!

## 2019-04-06 ENCOUNTER — Encounter: Payer: Self-pay | Admitting: Physician Assistant

## 2019-04-06 ENCOUNTER — Other Ambulatory Visit: Payer: Self-pay

## 2019-04-06 ENCOUNTER — Ambulatory Visit (INDEPENDENT_AMBULATORY_CARE_PROVIDER_SITE_OTHER): Payer: PPO | Admitting: Physician Assistant

## 2019-04-06 VITALS — BP 118/68 | HR 49 | Temp 98.0°F | Resp 16 | Ht 69.0 in | Wt 219.4 lb

## 2019-04-06 DIAGNOSIS — I951 Orthostatic hypotension: Secondary | ICD-10-CM

## 2019-04-06 DIAGNOSIS — R42 Dizziness and giddiness: Secondary | ICD-10-CM

## 2019-04-06 NOTE — Progress Notes (Signed)
Patient: Tom Moore Male    DOB: 1946/01/25   73 y.o.   MRN: [id number] Visit Date: 04/06/2019  Today's Provider: Trinna Post, PA-C   Chief Complaint  Patient presents with  . Dizziness   Subjective:     HPI Patient with history of heart failure with reduced EF and a-fib here today c/o of dizziness on and off for 2 months. Patient reports last two episodes happened this week. Patient reports going to [facility] on Tuesday and was advised to discontinue Spironolactone. He was also told to hold metoprolol if his BP was under certain parameters. Patient reports that he has been feeling better with out medication. Patient reports blood pressure readings have been good since stopping Spironolactone. He will feel dizzy mostly when standing up. He is unsure of his medication doses today and does not bring his medications with him.   Allergies  Allergen Reactions  . Zyrtec Allergy [Cetirizine] Diarrhea     Current Outpatient Medications:  .  ALPRAZolam (XANAX) 0.5 MG tablet, Take 1 tablet (0.5 mg total) by mouth 3 (three) times daily as needed for anxiety., Disp: 12 tablet, Rfl: 0 .  apixaban (ELIQUIS) 5 MG TABS tablet, Take 5 mg by mouth 2 (two) times daily. , Disp: , Rfl:  .  atorvastatin (LIPITOR) 40 MG tablet, Take 40 mg by mouth daily., Disp: , Rfl:  .  buPROPion (WELLBUTRIN) 75 MG tablet, Take 1 tablet (75 mg total) by mouth 2 (two) times daily., Disp: 60 tablet, Rfl: 5 .  Dulaglutide 1.5 MG/0.5ML SOPN, Inject into the skin., Disp: , Rfl:  .  furosemide (LASIX) 40 MG tablet, Take 1 tablet (40 mg total) by mouth 2 (two) times daily., Disp: 180 tablet, Rfl: 1 .  gabapentin (NEURONTIN) 400 MG capsule, Take 1 capsule (400 mg total) by mouth 2 (two) times daily., Disp: 60 capsule, Rfl: 3 .  glipiZIDE (GLUCOTROL) 10 MG tablet, TAKE ONE TABLET BY MOUTH THREE TIMES DAILY BEFORE MEALS, Disp: 270 tablet, Rfl: 1 .  glucose blood (ONE TOUCH ULTRA TEST) test strip, Use 3 (three) times  daily One touch ultra test strips e11.29, Disp: , Rfl:  .  insulin NPH Human (HUMULIN N,NOVOLIN N) 100 UNIT/ML injection, Take 15 units BID 12 hours apart (8 am, 8 pm approximately) *Relion brand only please*, Disp: , Rfl:  .  Magnesium Oxide 400 MG CAPS, Take 1 capsule (400 mg total) by mouth 2 (two) times daily., Disp: 180 capsule, Rfl: 3 .  metFORMIN (GLUCOPHAGE) 1000 MG tablet, TAKE 1 TABLET BY MOUTH TWICE DAILY WITH A MEAL, Disp: 180 tablet, Rfl: 0 .  methimazole (TAPAZOLE) 5 MG tablet, Take 5 mg by mouth., Disp: , Rfl:  .  metoprolol succinate (TOPROL-XL) 100 MG 24 hr tablet, Take 200 mg by mouth daily. , Disp: , Rfl: 0 .  multivitamin-iron-minerals-folic acid (THERAPEUTIC-M) TABS tablet, Take 1 tablet by mouth daily., Disp: , Rfl:  .  omeprazole (PRILOSEC) 40 MG capsule, TAKE 1 CAPSULE(40 MG) BY MOUTH DAILY, Disp: 90 capsule, Rfl: 0 .  omeprazole (PRILOSEC) 40 MG capsule, TAKE 1 CAPSULE BY MOUTH ONCE DAILY, Disp: 90 capsule, Rfl: 1 .  oxybutynin (DITROPAN) 5 MG tablet, 1 tablet at bedtime, Disp: 30 tablet, Rfl: 11 .  sertraline (ZOLOFT) 100 MG tablet, Take 1 tablet (100 mg total) by mouth daily., Disp: 90 tablet, Rfl: 1 .  tadalafil (ADCIRCA/CIALIS) 20 MG tablet, Take 1 tablet (20 mg total) by mouth daily as needed  for erectile dysfunction., Disp: 10 tablet, Rfl: 0 .  tamsulosin (FLOMAX) 0.4 MG CAPS capsule, TAKE 1 CAPSULE BY MOUTH ONCE DAILY, Disp: 90 capsule, Rfl: 1 .  triamcinolone cream (KENALOG) 0.1 %, Apply to left ear rash 2-3 x day as needed for itching., Disp: 30 g, Rfl: 1 .  venlafaxine XR (EFFEXOR-XR) 150 MG 24 hr capsule, TAKE 1 CAPSULE BY MOUTH ONCE DAILY WITH BREAKFAST, Disp: 90 capsule, Rfl: 1 .  diltiazem (CARDIZEM CD) 120 MG 24 hr capsule, Take 120 mg by mouth daily. , Disp: , Rfl:   Review of Systems  Neurological: Positive for dizziness and light-headedness.    Social History   Tobacco Use  . Smoking status: Former Smoker    Quit date: 04/03/1988    Years since  quitting: 31.0  . Smokeless tobacco: Never Used  Substance Use Topics  . Alcohol use: Not Currently    Comment: Ocassional alcohol use, Drinks wine.      Objective:   BP 118/68 (BP Location: Left Arm, Patient Position: Sitting, Cuff Size: Normal)   Pulse (!) 49   Temp 98 F (36.7 C) (Oral)   Resp 16   Ht 5\' 9"  (1.753 m)   Wt 219 lb 6.4 oz (99.5 kg)   SpO2 99%   BMI 32.40 kg/m  Vitals:   04/06/19 1132  BP: 118/68  Pulse: (!) 49  Resp: 16  Temp: 98 F (36.7 C)  TempSrc: Oral  SpO2: 99%  Weight: 219 lb 6.4 oz (99.5 kg)  Height: 5\' 9"  (1.753 m)     Physical Exam Constitutional:      Appearance: Normal appearance.  Cardiovascular:     Rate and Rhythm: Regular rhythm. Bradycardia present.     Heart sounds: Normal heart sounds.  Pulmonary:     Effort: Pulmonary effort is normal.     Breath sounds: Normal breath sounds.  Skin:    General: Skin is warm and dry.  Neurological:     Mental Status: He is alert and oriented to person, place, and time. Mental status is at baseline.  Psychiatric:        Mood and Affect: Mood normal.        Behavior: Behavior normal.      No results found for any visits on 04/06/19.     Assessment & Plan    1. Orthostatic hypotension  Vitals in office today show orthostatic hypotension. There are many medication discrepancies between our medication list and [facility], he is unable to specify what does he is taking. We have listed metoprolol XL 200 mg daily but kernodle most recently has 100 mg daily. EKG today looks stable. Would like him to decrease metoprolol by 50 mg daily, which if he is taking it as kernodle prescribed, should bring him to 50 mg metoprolol xl QD. He is additionally on Lasix 40 mg QD, flomax 0.4 mg QD, and cardizem 120 mg daily which all may be contributing to orthostatic hypotension. We will start with this change and have him follow up with Dr. Rosanna Randy in 2 weeks.   2. Dizziness  - EKG 12-Lead   The entirety of the information documented in the History of Present Illness, Review of Systems and Physical Exam were personally obtained by me. Portions of this information were initially documented by Lynford Humphrey, CMA and reviewed by me for thoroughness and accuracy.      Trinna Post, PA-C  [facility]

## 2019-04-06 NOTE — Patient Instructions (Addendum)
Metoprolol - whatever dose you are taking this medication at, I want you to decrease it by 50 mg a day. You will take a half a pill less a day. I think you are taking 100 mg of metoprolol daily and so I want you to take 50 mg metoprolol daily.   Also, your cardiologist has instructed you take Lasix 40 mg one pill daily.    Syncope Syncope is when you pass out (faint) for a short time. It is caused by a sudden decrease in blood flow to the brain. Signs that you may be about to pass out include:  Feeling dizzy or light-headed.  Feeling sick to your stomach (nauseous).  Seeing all white or all black.  Having cold, clammy skin. If you pass out, get help right away. Call your local emergency services (911 in the [place] Do not drive yourself to the hospital. Follow these instructions at home: Watch for any changes in your symptoms. Take these actions to stay safe and help with your symptoms: Lifestyle  Do not drive, use machinery, or play sports until your doctor says it is okay.  Do not drink alcohol.  Do not use any products that contain nicotine or tobacco, such as cigarettes and e-cigarettes. If you need help quitting, ask your doctor.  Drink enough fluid to keep your pee (urine) pale yellow. General instructions  Take over-the-counter and prescription medicines only as told by your doctor.  If you are taking blood pressure or heart medicine, sit up and stand up slowly. Spend a few minutes getting ready to sit and then stand. This can help you feel less dizzy.  Have someone stay with you until you feel stable.  If you start to feel like you might pass out, lie down right away and raise (elevate) your feet above the level of your heart. Breathe deeply and steadily. Wait until all of the symptoms are gone.  Keep all follow-up visits as told by your doctor. This is important. Get help right away if:  You have a very bad headache.  You pass out once or more than once.  You have  pain in your chest, belly, or back.  You have a very fast or uneven heartbeat (palpitations).  It hurts to breathe.  You are bleeding from your mouth or your bottom (rectum).  You have black or tarry poop (stool).  You have jerky movements that you cannot control (seizure).  You are confused.  You have trouble walking.  You are very weak.  You have vision problems. These symptoms may be an emergency. Do not wait to see if the symptoms will go away. Get medical help right away. Call your local emergency services (911 in the [place] Do not drive yourself to the hospital. Summary  Syncope is when you pass out (faint) for a short time. It is caused by a sudden decrease in blood flow to the brain.  Signs that you may be about to faint include feeling dizzy, light-headed, or sick to your stomach, seeing all white or all black, or having cold, clammy skin.  If you start to feel like you might pass out, lie down right away and raise (elevate) your feet above the level of your heart. Breathe deeply and steadily. Wait until all of the symptoms are gone. This information is not intended to replace advice given to you by your health care provider. Make sure you discuss any questions you have with your health care provider. Document Released: 03/01/2008  Document Revised: 10/26/2017 Document Reviewed: 10/26/2017 Elsevier Patient Education  2020 [employer].

## 2019-04-19 ENCOUNTER — Ambulatory Visit (INDEPENDENT_AMBULATORY_CARE_PROVIDER_SITE_OTHER): Payer: PPO | Admitting: Family Medicine

## 2019-04-19 ENCOUNTER — Other Ambulatory Visit: Payer: Self-pay

## 2019-04-19 ENCOUNTER — Encounter: Payer: Self-pay | Admitting: Family Medicine

## 2019-04-19 VITALS — BP 116/80 | HR 100 | Temp 98.2°F | Resp 16 | Ht 69.0 in | Wt 222.0 lb

## 2019-04-19 DIAGNOSIS — I951 Orthostatic hypotension: Secondary | ICD-10-CM | POA: Diagnosis not present

## 2019-04-19 DIAGNOSIS — E1122 Type 2 diabetes mellitus with diabetic chronic kidney disease: Secondary | ICD-10-CM | POA: Diagnosis not present

## 2019-04-19 DIAGNOSIS — I10 Essential (primary) hypertension: Secondary | ICD-10-CM | POA: Diagnosis not present

## 2019-04-19 DIAGNOSIS — E782 Mixed hyperlipidemia: Secondary | ICD-10-CM

## 2019-04-19 DIAGNOSIS — N183 Chronic kidney disease, stage 3 unspecified: Secondary | ICD-10-CM

## 2019-04-19 NOTE — Progress Notes (Signed)
Patient: Tom Moore Male    DOB: [birth year]   73 y.o.   MRN: [id number] Visit Date: 04/19/2019  Today's Provider: [name], MD   Chief Complaint  Patient presents with  . Follow-up    on dizziness   Subjective:   HPI Patient comes in today for a follow up. He was last seen in the office on 04/06/2019 (by Carles Collet, PA)  for dizziness and orthostatic hypotension. He was advised to decrease Metoprolol to 50mg  daily. Patient reports that he is tolerating med changes well, and he has not had anymore dizzy spells.   BP Readings from Last 3 Encounters:  04/19/19 116/80  04/06/19 118/68  01/31/19 118/70   Wt Readings from Last 3 Encounters:  04/19/19 222 lb (100.7 kg)  04/06/19 219 lb 6.4 oz (99.5 kg)  01/31/19 217 lb (98.4 kg)     Allergies  Allergen Reactions  . Zyrtec Allergy [Cetirizine] Diarrhea     Current Outpatient Medications:  .  ALPRAZolam (XANAX) 0.5 MG tablet, Take 1 tablet (0.5 mg total) by mouth 3 (three) times daily as needed for anxiety., Disp: 12 tablet, Rfl: 0 .  apixaban (ELIQUIS) 5 MG TABS tablet, Take 5 mg by mouth 2 (two) times daily. , Disp: , Rfl:  .  atorvastatin (LIPITOR) 40 MG tablet, Take 40 mg by mouth daily., Disp: , Rfl:  .  buPROPion (WELLBUTRIN) 75 MG tablet, Take 1 tablet (75 mg total) by mouth 2 (two) times daily., Disp: 60 tablet, Rfl: 5 .  Dulaglutide 1.5 MG/0.5ML SOPN, Inject into the skin., Disp: , Rfl:  .  furosemide (LASIX) 40 MG tablet, Take 1 tablet (40 mg total) by mouth 2 (two) times daily., Disp: 180 tablet, Rfl: 1 .  gabapentin (NEURONTIN) 400 MG capsule, Take 1 capsule (400 mg total) by mouth 2 (two) times daily., Disp: 60 capsule, Rfl: 3 .  glipiZIDE (GLUCOTROL) 10 MG tablet, TAKE ONE TABLET BY MOUTH THREE TIMES DAILY BEFORE MEALS, Disp: 270 tablet, Rfl: 1 .  glucose blood (ONE TOUCH ULTRA TEST) test strip, Use 3 (three) times daily One touch ultra test strips e11.29, Disp: , Rfl:  .  insulin NPH Human  (HUMULIN N,NOVOLIN N) 100 UNIT/ML injection, Take 15 units BID 12 hours apart (8 am, 8 pm approximately) *Relion brand only please*, Disp: , Rfl:  .  Magnesium Oxide 400 MG CAPS, Take 1 capsule (400 mg total) by mouth 2 (two) times daily., Disp: 180 capsule, Rfl: 3 .  metFORMIN (GLUCOPHAGE) 1000 MG tablet, TAKE 1 TABLET BY MOUTH TWICE DAILY WITH A MEAL, Disp: 180 tablet, Rfl: 0 .  methimazole (TAPAZOLE) 5 MG tablet, Take 5 mg by mouth., Disp: , Rfl:  .  metoprolol succinate (TOPROL-XL) 100 MG 24 hr tablet, Take 200 mg by mouth daily. , Disp: , Rfl: 0 .  multivitamin-iron-minerals-folic acid (THERAPEUTIC-M) TABS tablet, Take 1 tablet by mouth daily., Disp: , Rfl:  .  omeprazole (PRILOSEC) 40 MG capsule, TAKE 1 CAPSULE(40 MG) BY MOUTH DAILY, Disp: 90 capsule, Rfl: 0 .  omeprazole (PRILOSEC) 40 MG capsule, TAKE 1 CAPSULE BY MOUTH ONCE DAILY, Disp: 90 capsule, Rfl: 1 .  oxybutynin (DITROPAN) 5 MG tablet, 1 tablet at bedtime, Disp: 30 tablet, Rfl: 11 .  sertraline (ZOLOFT) 100 MG tablet, Take 1 tablet (100 mg total) by mouth daily., Disp: 90 tablet, Rfl: 1 .  tadalafil (ADCIRCA/CIALIS) 20 MG tablet, Take 1 tablet (20 mg total) by mouth daily as needed  for erectile dysfunction., Disp: 10 tablet, Rfl: 0 .  tamsulosin (FLOMAX) 0.4 MG CAPS capsule, TAKE 1 CAPSULE BY MOUTH ONCE DAILY, Disp: 90 capsule, Rfl: 1 .  triamcinolone cream (KENALOG) 0.1 %, Apply to left ear rash 2-3 x day as needed for itching., Disp: 30 g, Rfl: 1 .  venlafaxine XR (EFFEXOR-XR) 150 MG 24 hr capsule, TAKE 1 CAPSULE BY MOUTH ONCE DAILY WITH BREAKFAST, Disp: 90 capsule, Rfl: 1 .  diltiazem (CARDIZEM CD) 120 MG 24 hr capsule, Take 120 mg by mouth daily. , Disp: , Rfl:   Review of Systems  Constitutional: Negative for activity change and fatigue.  Respiratory: Negative for cough, shortness of breath and wheezing.   Cardiovascular: Negative for chest pain, palpitations and leg swelling.  Allergic/Immunologic: Negative for environmental  allergies.  Neurological: Negative for dizziness, weakness, light-headedness and headaches.  Psychiatric/Behavioral: Negative for agitation, self-injury, sleep disturbance and suicidal ideas. The patient is not nervous/anxious.     Social History   Tobacco Use  . Smoking status: Former Smoker    Quit date: 04/03/1988    Years since quitting: 31.0  . Smokeless tobacco: Never Used  Substance Use Topics  . Alcohol use: Not Currently    Comment: Ocassional alcohol use, Drinks wine.      Objective:   BP 116/80   Pulse 100   Temp 98.2 F (36.8 C)   Resp 16   Ht 5\' 9"  (1.753 m)   Wt 222 lb (100.7 kg)   SpO2 98%   BMI 32.78 kg/m  Vitals:   04/19/19 0934  BP: 116/80  Pulse: 100  Resp: 16  Temp: 98.2 F (36.8 C)  SpO2: 98%  Weight: 222 lb (100.7 kg)  Height: 5\' 9"  (1.753 m)     Physical Exam Vitals signs reviewed.  Constitutional:      Appearance: Normal appearance.  HENT:     Head: Normocephalic and atraumatic.     Right Ear: External ear normal.     Left Ear: External ear normal.     Nose: Nose normal.     Mouth/Throat:     Pharynx: Oropharynx is clear.  Eyes:     General: No scleral icterus. Cardiovascular:     Rate and Rhythm: Normal rate and regular rhythm.     Pulses: Normal pulses.     Heart sounds: Normal heart sounds.  Pulmonary:     Effort: Pulmonary effort is normal.     Breath sounds: Normal breath sounds.  Abdominal:     General: Abdomen is flat.     Palpations: Abdomen is soft.  Musculoskeletal:     Right lower leg: No edema.     Left lower leg: No edema.  Skin:    General: Skin is dry.  Neurological:     General: No focal deficit present.     Mental Status: He is alert and oriented to person, place, and time. Mental status is at baseline.  Psychiatric:        Mood and Affect: Mood normal.        Behavior: Behavior normal.        Thought Content: Thought content normal.        Judgment: Judgment normal.      No results found for any  visits on 04/19/19.     Assessment & Plan    1. Orthostatic hypotension Improved on lower dose metoprolol.More than 50% 25 minute visit spent in counseling or coordination of care  - CBC with Differential/Platelet -  TSH  2. Controlled type 2 diabetes mellitus with stage 3 chronic kidney disease, unspecified whether long term insulin use (HCC) CPE this fall.  3. Essential hypertension  - Comprehensive metabolic panel - Lipid panel  4. Mixed hyperlipidemia Check labs.      Cranford Mon, MD  [facility]

## 2019-04-23 DIAGNOSIS — I10 Essential (primary) hypertension: Secondary | ICD-10-CM | POA: Diagnosis not present

## 2019-04-23 DIAGNOSIS — I951 Orthostatic hypotension: Secondary | ICD-10-CM | POA: Diagnosis not present

## 2019-04-24 ENCOUNTER — Telehealth: Payer: Self-pay

## 2019-04-24 LAB — CBC WITH DIFFERENTIAL/PLATELET
Basophils Absolute: 0 10*3/uL (ref 0.0–0.2)
Basos: 0 %
EOS (ABSOLUTE): 0.4 10*3/uL (ref 0.0–0.4)
Eos: 6 %
Hematocrit: 43.6 % (ref 37.5–51.0)
Hemoglobin: 14.1 g/dL (ref 13.0–17.7)
Immature Grans (Abs): 0 10*3/uL (ref 0.0–0.1)
Immature Granulocytes: 0 %
Lymphocytes Absolute: 2.1 10*3/uL (ref 0.7–3.1)
Lymphs: 28 %
MCH: 29.8 pg (ref 26.6–33.0)
MCHC: 32.3 g/dL (ref 31.5–35.7)
MCV: 92 fL (ref 79–97)
Monocytes Absolute: 0.6 10*3/uL (ref 0.1–0.9)
Monocytes: 8 %
Neutrophils Absolute: 4.1 10*3/uL (ref 1.4–7.0)
Neutrophils: 58 %
Platelets: 173 10*3/uL (ref 150–450)
RBC: 4.73 x10E6/uL (ref 4.14–5.80)
RDW: 13.2 % (ref 11.6–15.4)
WBC: 7.3 10*3/uL (ref 3.4–10.8)

## 2019-04-24 LAB — COMPREHENSIVE METABOLIC PANEL
ALT: 38 IU/L (ref 0–44)
AST: 33 IU/L (ref 0–40)
Albumin/Globulin Ratio: 1.8 (ref 1.2–2.2)
Albumin: 4.4 g/dL (ref 3.7–4.7)
Alkaline Phosphatase: 130 IU/L — ABNORMAL HIGH (ref 39–117)
BUN/Creatinine Ratio: 14 (ref 10–24)
BUN: 18 mg/dL (ref 8–27)
Bilirubin Total: 0.3 mg/dL (ref 0.0–1.2)
CO2: 27 mmol/L (ref 20–29)
Calcium: 9 mg/dL (ref 8.6–10.2)
Chloride: 103 mmol/L (ref 96–106)
Creatinine, Ser: 1.32 mg/dL — ABNORMAL HIGH (ref 0.76–1.27)
GFR calc Af Amer: 62 mL/min/{1.73_m2} (ref >59)
GFR calc non Af Amer: 54 mL/min/{1.73_m2} — ABNORMAL LOW (ref >59)
Globulin, Total: 2.4 g/dL (ref 1.5–4.5)
Glucose: 28 mg/dL — CL (ref 65–99)
Potassium: 3.7 mmol/L (ref 3.5–5.2)
Sodium: 146 mmol/L — ABNORMAL HIGH (ref 134–144)
Total Protein: 6.8 g/dL (ref 6.0–8.5)

## 2019-04-24 LAB — LIPID PANEL
Chol/HDL Ratio: 3.2 ratio (ref 0.0–5.0)
Cholesterol, Total: 119 mg/dL (ref 100–199)
HDL: 37 mg/dL — ABNORMAL LOW (ref >39)
LDL Calculated: 47 mg/dL (ref 0–99)
Triglycerides: 175 mg/dL — ABNORMAL HIGH (ref 0–149)
VLDL Cholesterol Cal: 35 mg/dL (ref 5–40)

## 2019-04-24 LAB — TSH: TSH: 3.93 u[IU]/mL (ref 0.450–4.500)

## 2019-04-24 NOTE — Telephone Encounter (Signed)
Patient advised.KW 

## 2019-04-24 NOTE — Telephone Encounter (Signed)
-----   Message from Jerrol Banana., MD sent at 04/24/2019 12:34 PM EDT ----- BS was very low day of lab draw.Otherwise ok. If pt having low BS frequently would cut back on insulin dose.

## 2019-04-26 ENCOUNTER — Other Ambulatory Visit: Payer: Self-pay | Admitting: Family Medicine

## 2019-04-26 NOTE — Telephone Encounter (Signed)
[facility] faxed refill request for the following medications:   omeprazole (PRILOSEC) 40 MG capsule    Please advise.  

## 2019-04-27 DIAGNOSIS — Z794 Long term (current) use of insulin: Secondary | ICD-10-CM | POA: Diagnosis not present

## 2019-04-27 DIAGNOSIS — N183 Chronic kidney disease, stage 3 (moderate): Secondary | ICD-10-CM | POA: Diagnosis not present

## 2019-04-27 DIAGNOSIS — E1122 Type 2 diabetes mellitus with diabetic chronic kidney disease: Secondary | ICD-10-CM | POA: Diagnosis not present

## 2019-04-27 DIAGNOSIS — E0590 Thyrotoxicosis, unspecified without thyrotoxic crisis or storm: Secondary | ICD-10-CM | POA: Diagnosis not present

## 2019-04-27 MED ORDER — OMEPRAZOLE 40 MG PO CPDR: 40.0000 mg | DELAYED_RELEASE_CAPSULE | Freq: Every day | ORAL | 1 refills | Status: DC

## 2019-04-27 NOTE — Telephone Encounter (Signed)
Medication sent into the pharmacy. 

## 2019-04-30 DIAGNOSIS — E1122 Type 2 diabetes mellitus with diabetic chronic kidney disease: Secondary | ICD-10-CM | POA: Diagnosis not present

## 2019-04-30 DIAGNOSIS — E0590 Thyrotoxicosis, unspecified without thyrotoxic crisis or storm: Secondary | ICD-10-CM | POA: Diagnosis not present

## 2019-04-30 DIAGNOSIS — Z794 Long term (current) use of insulin: Secondary | ICD-10-CM | POA: Diagnosis not present

## 2019-04-30 DIAGNOSIS — N183 Chronic kidney disease, stage 3 (moderate): Secondary | ICD-10-CM | POA: Diagnosis not present

## 2019-05-03 ENCOUNTER — Ambulatory Visit: Payer: Self-pay | Admitting: Family Medicine

## 2019-05-29 DIAGNOSIS — E119 Type 2 diabetes mellitus without complications: Secondary | ICD-10-CM | POA: Diagnosis not present

## 2019-05-29 LAB — HM DIABETES EYE EXAM

## 2019-06-11 ENCOUNTER — Ambulatory Visit: Payer: Self-pay | Admitting: Pharmacist

## 2019-06-11 DIAGNOSIS — E1122 Type 2 diabetes mellitus with diabetic chronic kidney disease: Secondary | ICD-10-CM

## 2019-06-11 DIAGNOSIS — E782 Mixed hyperlipidemia: Secondary | ICD-10-CM

## 2019-06-11 NOTE — Chronic Care Management (AMB) (Signed)
  Chronic Care Management   Note  06/11/2019 Name: Tom Moore MRN: [id number] DOB: 08/27/1946  Subjective: Incoming call from patient regarding his Trulicity pens and refills needed. Used last pen yesterday.   Assessment: Reviewed refills process. Patient is doing well with Trulicity, complains of no side effects, managing BG very well.   Goals Addressed            This Visit's Progress   . COMPLETED: I can't afford my Trulicity (pt-stated)       Current Barriers:  . financial  Pharmacist Clinical Goal(s): Over the next 14 days, Mr.. Moore will provide the necessary supplementary documents (proof of out of pocket prescription expenditure, proof of household income) needed for medication assistance applications to CCM pharmacist.   Updated 01/17/19: Tom Moore provided household financial statement and signed application. CCM pharmacist submitted provider portion of application to Tom Moore    Updated 01/24/19: Received fax from Tom Moore with provider portion of [facility] application. Submitted completed application to [employer] and uploaded to media tab.  Updated 06/11/19: Patient needing refill, coordinated with [employer] and [facility] to utilize refill on file; Patient to expect call from [place] to schedule delivery of medication next week. Provided counseling to patient to take the next dose of Trulicity as soon as possible if medication is delivered 2-3 days late.   Interventions: . CCM pharmacist will apply for medication assistance program for Trulicity made by [employer] and prescribed by Tom Moore.   Patient Self Care Activities:  Marland Kitchen Gather necessary documents needed to apply for medication assistance  Please see past updates related to this goal by clicking on the "Past Updates" button in the selected goal           Follow up plan: Telephone follow up appointment with care management team member scheduled for: early November 2020 to  facilitate application for Trulicity for [id number]  Tom Moore, PharmD Clinical Pharmacist [place] [phone]

## 2019-06-11 NOTE — Patient Instructions (Signed)
Goals Addressed            This Visit's Progress   . COMPLETED: I can't afford my Trulicity (pt-stated)       Current Barriers:  . financial  Pharmacist Clinical Goal(s): Over the next 14 days, Tom Moore will provide the necessary supplementary documents (proof of out of pocket prescription expenditure, proof of household income) needed for medication assistance applications to CCM pharmacist.   Updated 01/17/19: Tom Moore provided household financial statement and signed application. CCM pharmacist submitted provider portion of application to Dr. Lucilla Lame    Updated 01/24/19: Received fax from Dr. Lucilla Lame with provider portion of [facility] application. Submitted completed application to [employer] and uploaded to media tab.  Updated 06/11/19: Patient needing refill, coordinated with [employer] and [facility] to utilize refill on file; Patient to expect call from [name] to schedule delivery of medication next week. Provided counseling to patient to take the next dose of Trulicity as soon as possible if medication is delivered 2-3 days late.   Interventions: . CCM pharmacist will apply for medication assistance program for Trulicity made by [employer] and prescribed by Dr. Lucilla Lame.   Patient Self Care Activities:  Marland Kitchen Gather necessary documents needed to apply for medication assistance  Please see past updates related to this goal by clicking on the "Past Updates" button in the selected goal

## 2019-06-18 ENCOUNTER — Telehealth: Payer: Self-pay | Admitting: Pharmacist

## 2019-06-18 NOTE — Telephone Encounter (Signed)
Patient would like for you to call him when you come back from vacation regarding Trulicity.

## 2019-06-18 NOTE — Telephone Encounter (Signed)
Covering for Tom Moore this week.   Contacted patient; he noted that he has not received his delivery of Trulicity from [employer]. Notes that he was given 2 pens as samples from clinic, so had a dose today and has a dose for next week.   [place]. Apparently, they had the patient in as a duplicate "Amarrion" and "[name]", and they have to remove [name] profile before order can be processed. Rep expedited this process and should happen in the next 24-48 hours. Should be able to ship and deliver to patient next Tuesday, 9/29.  I will call Lilly Cares back Thursday to f/u and ensure no other hiccups in process  Catie Darnelle Maffucci, [place] [phone]

## 2019-06-21 NOTE — Telephone Encounter (Signed)
Rafael Capo. They have fixed the problem and are processing now. I requested they expedite- they are doing so, and the medication should arrive to the patient's home on or before Tuesday, September 29th.   Contacted patient, informed him of the above. He expressed appreciation.   Will updated [name] upon her return.   Catie Darnelle Maffucci, [place] [phone]

## 2019-07-05 ENCOUNTER — Other Ambulatory Visit: Payer: Self-pay

## 2019-07-05 ENCOUNTER — Ambulatory Visit (INDEPENDENT_AMBULATORY_CARE_PROVIDER_SITE_OTHER): Payer: PPO | Admitting: Family Medicine

## 2019-07-05 ENCOUNTER — Encounter: Payer: Self-pay | Admitting: Family Medicine

## 2019-07-05 VITALS — BP 122/84 | HR 65 | Temp 96.2°F | Resp 18 | Wt 224.6 lb

## 2019-07-05 DIAGNOSIS — E0590 Thyrotoxicosis, unspecified without thyrotoxic crisis or storm: Secondary | ICD-10-CM

## 2019-07-05 DIAGNOSIS — F324 Major depressive disorder, single episode, in partial remission: Secondary | ICD-10-CM | POA: Diagnosis not present

## 2019-07-05 DIAGNOSIS — I480 Paroxysmal atrial fibrillation: Secondary | ICD-10-CM

## 2019-07-05 DIAGNOSIS — N183 Chronic kidney disease, stage 3 unspecified: Secondary | ICD-10-CM

## 2019-07-05 DIAGNOSIS — G4733 Obstructive sleep apnea (adult) (pediatric): Secondary | ICD-10-CM | POA: Diagnosis not present

## 2019-07-05 DIAGNOSIS — E1122 Type 2 diabetes mellitus with diabetic chronic kidney disease: Secondary | ICD-10-CM

## 2019-07-05 DIAGNOSIS — R002 Palpitations: Secondary | ICD-10-CM

## 2019-07-05 DIAGNOSIS — Z0000 Encounter for general adult medical examination without abnormal findings: Secondary | ICD-10-CM | POA: Diagnosis not present

## 2019-07-05 LAB — HEMOCCULT GUIAC POC 1CARD (OFFICE): Fecal Occult Blood, POC: NEGATIVE

## 2019-07-05 LAB — EKG 12-LEAD

## 2019-07-05 LAB — POCT URINALYSIS DIPSTICK
Bilirubin, UA: NEGATIVE
Blood, UA: NEGATIVE
Glucose, UA: NEGATIVE
Ketones, UA: NEGATIVE
Leukocytes, UA: NEGATIVE
Nitrite, UA: NEGATIVE
Protein, UA: POSITIVE — AB
Spec Grav, UA: 1.02 (ref 1.010–1.025)
Urobilinogen, UA: 0.2 E.U./dL
pH, UA: 6 (ref 5.0–8.0)

## 2019-07-05 NOTE — Progress Notes (Signed)
Patient: Tom Moore, Male    DOB: Apr 10, 1946, 73 y.o.   MRN: [id number] Visit Date: 07/05/2019  Today's Provider: [name], MD   Chief Complaint  Patient presents with  . Annual Exam   Subjective:     Annual physical exam Tom Moore is a 73 y.o. male who presents today for health maintenance and complete physical. He feels well.He reports not exercising. He reports he is not sleeping well, he wakes up a few times during the night. He is scheduled to have right cataract surgery next month.  He declines flu shot, states he had a bad reaction to a flu shot about 10 years ago.  He states his blood sugars are good and followed by Dr. Elisabeth Cara from endocrinology.  He has sleep apnea but says he cannot tolerate CPAP. -----------------------------------------------------------------   Review of Systems  Constitutional: Positive for fatigue.  HENT: Positive for dental problem.   Eyes: Negative.   Respiratory: Negative.   Cardiovascular: Negative.   Gastrointestinal: Negative.   Endocrine: Positive for polyuria.  Genitourinary: Negative.   Allergic/Immunologic: Negative.   Neurological: Negative.   Hematological: Negative.   Psychiatric/Behavioral: Negative.        Chronic insomnia.    Social History      He  reports that he quit smoking about 31 years ago. He has never used smokeless tobacco. He reports previous alcohol use. He reports that he does not use drugs.       Social History   Socioeconomic History  . Marital status: Divorced    Spouse name: Not on file  . Number of children: Not on file  . Years of education: Not on file  . Highest education level: Not on file  Occupational History  . Not on file  Social Needs  . Financial resource strain: Not on file  . Food insecurity    Worry: Not on file    Inability: Not on file  . Transportation needs    Medical: Not on file    Non-medical: Not on file  Tobacco Use  . Smoking status: Former  Smoker    Quit date: 04/03/1988    Years since quitting: 31.2  . Smokeless tobacco: Never Used  Substance and Sexual Activity  . Alcohol use: Not Currently    Comment: Ocassional alcohol use, Drinks wine.  . Drug use: Never  . Sexual activity: Not Currently  Lifestyle  . Physical activity    Days per week: 7 days    Minutes per session: 40 min  . Stress: Not at all  Relationships  . Social connections    Talks on phone: More than three times a week    Gets together: More than three times a week    Attends religious service: 1 to 4 times per year    Active member of club or organization: No    Attends meetings of clubs or organizations: Never    Relationship status: Widowed  Other Topics Concern  . Not on file  Social History Narrative  . Not on file    Past Medical History:  Diagnosis Date  . Anxiety   . Arthritis   . Depression   . Diabetes mellitus without complication ([place])   . Dysrhythmia    atrial fib  . GERD (gastroesophageal reflux disease)   . Hyperlipidemia   . Hypertension   . Hyperthyroidism   . Insomnia   . Sleep apnea    unable to  tolerater CPAP     Patient Active Problem List   Diagnosis Date Noted  . Stasis dermatitis of both legs 06/09/2018  . Bilateral carpal tunnel syndrome 11/25/2017  . Nocturia 08/17/2017  . CKD stage 3 due to type 2 diabetes mellitus ([place]) 05/05/2017  . Heart failure with reduced ejection fraction ([place]) 11/30/2016  . Paroxysmal atrial fibrillation ([place]) 11/30/2016  . Subclinical hyperthyroidism 11/30/2016  . Insomnia 05/20/2015  . Depression 05/20/2015  . Hyperlipidemia 05/20/2015  . Anxiety 05/20/2015  . Erectile dysfunction 05/20/2015  . Hypertension 05/20/2015  . Controlled diabetes mellitus ([place]) 05/20/2015  . GERD (gastroesophageal reflux disease) 05/20/2015  . Osteoarthritis, knee 05/20/2015    Past Surgical History:  Procedure Laterality Date  . CARPAL TUNNEL RELEASE Right 04/10/2018   Procedure: RELEASE  MEDIAN NERVE AT CARPAL TUNNEL;  Surgeon: Earnestine Leys, MD;  Location: [facility] ORS;  Service: Orthopedics;  Laterality: Right;  . CARPAL TUNNEL RELEASE Left 05/08/2018   Procedure: CARPAL TUNNEL RELEASE;  Surgeon: Earnestine Leys, MD;  Location: [facility] ORS;  Service: Orthopedics;  Laterality: Left;  . TONSILLECTOMY      Family History        Family Status  Relation Name Status  . Mother  (Not Specified)       History of stroke, Kidney Failure  . Father  (Not Specified)       History of stroke, heart disease, alcoholism        His family history includes Heart disease in his mother; Hypertension in his mother.      Allergies  Allergen Reactions  . Zyrtec Allergy [Cetirizine] Diarrhea     Current Outpatient Medications:  .  atorvastatin (LIPITOR) 40 MG tablet, Take 40 mg by mouth daily., Disp: , Rfl:  .  buPROPion (WELLBUTRIN) 75 MG tablet, Take 1 tablet (75 mg total) by mouth 2 (two) times daily., Disp: 60 tablet, Rfl: 5 .  Dulaglutide 1.5 MG/0.5ML SOPN, Inject into the skin., Disp: , Rfl:  .  furosemide (LASIX) 40 MG tablet, Take 1 tablet (40 mg total) by mouth 2 (two) times daily., Disp: 180 tablet, Rfl: 1 .  glipiZIDE (GLUCOTROL) 10 MG tablet, TAKE ONE TABLET BY MOUTH THREE TIMES DAILY BEFORE MEALS, Disp: 270 tablet, Rfl: 1 .  glucose blood (ONE TOUCH ULTRA TEST) test strip, Use 3 (three) times daily One touch ultra test strips e11.29, Disp: , Rfl:  .  insulin NPH Human (HUMULIN N,NOVOLIN N) 100 UNIT/ML injection, Take 15 units BID 12 hours apart (8 am, 8 pm approximately) *Relion brand only please*, Disp: , Rfl:  .  metFORMIN (GLUCOPHAGE) 1000 MG tablet, TAKE 1 TABLET BY MOUTH TWICE DAILY WITH A MEAL, Disp: 180 tablet, Rfl: 0 .  methimazole (TAPAZOLE) 5 MG tablet, Take 5 mg by mouth., Disp: , Rfl:  .  metoprolol succinate (TOPROL-XL) 100 MG 24 hr tablet, Take 200 mg by mouth daily. , Disp: , Rfl: 0 .  omeprazole (PRILOSEC) 40 MG capsule, TAKE 1 CAPSULE(40 MG) BY MOUTH DAILY, Disp: 90  capsule, Rfl: 0 .  oxybutynin (DITROPAN) 5 MG tablet, 1 tablet at bedtime, Disp: 30 tablet, Rfl: 11 .  sertraline (ZOLOFT) 100 MG tablet, Take 1 tablet (100 mg total) by mouth daily., Disp: 90 tablet, Rfl: 1 .  ALPRAZolam (XANAX) 0.5 MG tablet, Take 1 tablet (0.5 mg total) by mouth 3 (three) times daily as needed for anxiety. (Patient not taking: Reported on 07/05/2019), Disp: 12 tablet, Rfl: 0 .  apixaban (ELIQUIS) 5 MG TABS tablet, Take  5 mg by mouth 2 (two) times daily. , Disp: , Rfl:  .  diltiazem (CARDIZEM CD) 120 MG 24 hr capsule, Take 120 mg by mouth daily. , Disp: , Rfl:  .  gabapentin (NEURONTIN) 400 MG capsule, Take 1 capsule (400 mg total) by mouth 2 (two) times daily. (Patient not taking: Reported on 07/05/2019), Disp: 60 capsule, Rfl: 3 .  Magnesium Oxide 400 MG CAPS, Take 1 capsule (400 mg total) by mouth 2 (two) times daily. (Patient not taking: Reported on 07/05/2019), Disp: 180 capsule, Rfl: 3 .  multivitamin-iron-minerals-folic acid (THERAPEUTIC-M) TABS tablet, Take 1 tablet by mouth daily., Disp: , Rfl:  .  omeprazole (PRILOSEC) 40 MG capsule, Take 1 capsule (40 mg total) by mouth daily., Disp: 90 capsule, Rfl: 1 .  tadalafil (ADCIRCA/CIALIS) 20 MG tablet, Take 1 tablet (20 mg total) by mouth daily as needed for erectile dysfunction. (Patient not taking: Reported on 07/05/2019), Disp: 10 tablet, Rfl: 0 .  tamsulosin (FLOMAX) 0.4 MG CAPS capsule, TAKE 1 CAPSULE BY MOUTH ONCE DAILY (Patient not taking: Reported on 07/05/2019), Disp: 90 capsule, Rfl: 1 .  triamcinolone cream (KENALOG) 0.1 %, Apply to left ear rash 2-3 x day as needed for itching. (Patient not taking: Reported on 07/05/2019), Disp: 30 g, Rfl: 1 .  venlafaxine XR (EFFEXOR-XR) 150 MG 24 hr capsule, TAKE 1 CAPSULE BY MOUTH ONCE DAILY WITH BREAKFAST (Patient not taking: Reported on 07/05/2019), Disp: 90 capsule, Rfl: 1   Patient Care Team: Jerrol Banana., MD as PCP - General (Family Medicine) Cathi Roan, [facility]  (Pharmacist) [place], [place], [place] as Social Worker    Objective:    Vitals: BP 122/84 (BP Location: Right Arm, Patient Position: Sitting, Cuff Size: Large)   Pulse 65   Temp (!) 96.2 F (35.7 C) (Temporal)   Resp 18   Wt 224 lb 9.6 oz (101.9 kg)   SpO2 98%   BMI 33.17 kg/m    Vitals:   07/05/19 1011  BP: 122/84  Pulse: 65  Resp: 18  Temp: (!) 96.2 F (35.7 C)  TempSrc: Temporal  SpO2: 98%  Weight: 224 lb 9.6 oz (101.9 kg)     Physical Exam Vitals signs reviewed.  Constitutional:      Appearance: Normal appearance.  HENT:     Head: Normocephalic and atraumatic.     Right Ear: External ear normal.     Left Ear: External ear normal.     Nose: Nose normal.     Mouth/Throat:     Pharynx: Oropharynx is clear.  Eyes:     General: No scleral icterus. Cardiovascular:     Rate and Rhythm: Regular rhythm. Tachycardia present.     Pulses: Normal pulses.     Heart sounds: Normal heart sounds.  Pulmonary:     Effort: Pulmonary effort is normal.     Breath sounds: Normal breath sounds.  Abdominal:     General: Abdomen is flat.     Palpations: Abdomen is soft.  Musculoskeletal:     Right lower leg: No edema.     Left lower leg: No edema.  Skin:    General: Skin is warm and dry.  Neurological:     General: No focal deficit present.     Mental Status: He is alert and oriented to person, place, and time. Mental status is at baseline.  Psychiatric:        Mood and Affect: Mood normal.        Behavior:  Behavior normal.        Thought Content: Thought content normal.        Judgment: Judgment normal.    ECG reveals atrial tachycardia rate of about 110 with nonspecific ST changes which are unchanged from last cardiogram or earlier this year in July.  Depression Screen PHQ 2/9 Scores 07/05/2019 02/09/2019 01/31/2019 01/03/2019  PHQ - 2 Score 4 1 3 6   PHQ- 9 Score 17 - 11 20       Assessment & Plan:     Routine Health Maintenance and Physical Exam  Exercise  Activities and Dietary recommendations Goals    . " I would like to feel better (pt-stated)     Current Barriers:  . Financial constraints . Mental Health Concerns   Clinical Social Work Clinical Goal(s):  Marland Kitchen Over the next 30  days, client will work with SW to address concerns related to his symptoms of depression  Interventions: . Patient interviewed and appropriate assessments performed . Continued to provide mental health counseling and emotional support with regard to patient's symptoms of depression  . Patient continues to report no side affects with the current anti-depressant prescribed and feels that the medication has been effective . Continued to emphasized self care and utilizing healthy coping strategies for depression . Discussed plans with patient for ongoing care management follow up and provided patient with direct contact information for care management team . Patient continues to deny need for outpatient therapy referral at this time   Patient Self Care Activities:  . Self administers medications as prescribed . Attends all scheduled provider appointments . Performs ADL's independently . Performs IADL's independently  Please see past updates related to this goal by clicking on the "Past Updates" button in the selected goal          There is no immunization history on file for this patient.  Health Maintenance  Topic Date Due  . Hepatitis C Screening  September 04, 1946  . FOOT EXAM  01/31/1957  . URINE MICROALBUMIN  01/31/1957  . TETANUS/TDAP  01/31/1966  . PNA vac Low Risk Adult (1 of 2 - PCV13) 02/01/2012  . HEMOGLOBIN A1C  12/19/2018  . INFLUENZA VACCINE  04/28/2019  . OPHTHALMOLOGY EXAM  05/28/2020  . COLONOSCOPY  03/04/2021     Discussed health benefits of physical activity, and encouraged him to engage in regular exercise appropriate for his age and condition.   1. Annual physical exam Colonoscopy 2022 - POCT urinalysis dipstick - POCT Occult Blood  Stool  2. Rapid palpitations Atrial fibrillation.  Patient on Eliquis - EKG 12-Lead  3. Paroxysmal atrial fibrillation (HCC)   4. CKD stage 3 due to type 2 diabetes mellitus ([place])   5. OSA (obstructive sleep apnea)   6. Major depressive disorder with single episode, in partial remission ([place])  7.  Subclinical hyperthyroidism Patient on methimazole.  --------------------------------------------------------------------    [name], MD  [facility]

## 2019-07-26 DIAGNOSIS — K047 Periapical abscess without sinus: Secondary | ICD-10-CM | POA: Diagnosis not present

## 2019-07-27 ENCOUNTER — Other Ambulatory Visit: Payer: Self-pay

## 2019-07-27 ENCOUNTER — Encounter: Payer: Self-pay | Admitting: Family Medicine

## 2019-07-27 ENCOUNTER — Ambulatory Visit (INDEPENDENT_AMBULATORY_CARE_PROVIDER_SITE_OTHER): Payer: PPO | Admitting: Family Medicine

## 2019-07-27 VITALS — BP 116/76 | HR 62 | Temp 98.5°F | Resp 20 | Ht 69.0 in | Wt 221.0 lb

## 2019-07-27 DIAGNOSIS — K047 Periapical abscess without sinus: Secondary | ICD-10-CM | POA: Diagnosis not present

## 2019-07-27 MED ORDER — ACETAMINOPHEN-CODEINE #3 300-30 MG PO TABS: 1.0000 | ORAL_TABLET | Freq: Four times a day (QID) | ORAL | 0 refills | Status: DC | PRN

## 2019-07-27 NOTE — Progress Notes (Signed)
Patient: Tom Moore Male    DOB: 1946-06-10   73 y.o.   MRN: [id number] Visit Date: 07/27/2019  Today's Provider: Vernie Murders, PA   Chief Complaint  Patient presents with  . Oral Pain   Subjective:     Dental Pain  This is a new problem. The current episode started in the past 7 days. The problem occurs constantly. The problem has been gradually worsening. The pain is at a severity of 8/10. The pain is moderate. Associated symptoms include difficulty swallowing and facial pain. Associated symptoms comments: Throat swelling for 2 days. He has tried NSAIDs, rest and acetaminophen for the symptoms. The treatment provided no relief.  Was seen at [facility] yesterady and was placed on Augmentin 875-125mg   twice a day for 7 days. He started taking them lastnight.   Past Medical History:  Diagnosis Date  . Anxiety   . Arthritis   . Depression   . Diabetes mellitus without complication ([place])   . Dysrhythmia    atrial fib  . GERD (gastroesophageal reflux disease)   . Hyperlipidemia   . Hypertension   . Hyperthyroidism   . Insomnia   . Sleep apnea    unable to tolerater CPAP   Past Surgical History:  Procedure Laterality Date  . CARPAL TUNNEL RELEASE Right 04/10/2018   Procedure: RELEASE MEDIAN NERVE AT CARPAL TUNNEL;  Surgeon: Earnestine Leys, MD;  Location: [facility] ORS;  Service: Orthopedics;  Laterality: Right;  . CARPAL TUNNEL RELEASE Left 05/08/2018   Procedure: CARPAL TUNNEL RELEASE;  Surgeon: Earnestine Leys, MD;  Location: [facility] ORS;  Service: Orthopedics;  Laterality: Left;  . TONSILLECTOMY     Family History  Problem Relation Age of Onset  . Hypertension Mother   . Heart disease Mother    Allergies  Allergen Reactions  . Zyrtec Allergy [Cetirizine] Diarrhea    Current Outpatient Medications:  .  apixaban (ELIQUIS) 5 MG TABS tablet, Take 5 mg by mouth 2 (two) times daily. , Disp: , Rfl:  .  atorvastatin (LIPITOR) 40 MG tablet, Take 40 mg by mouth  daily., Disp: , Rfl:  .  buPROPion (WELLBUTRIN) 75 MG tablet, Take 1 tablet (75 mg total) by mouth 2 (two) times daily., Disp: 60 tablet, Rfl: 5 .  furosemide (LASIX) 40 MG tablet, Take 1 tablet (40 mg total) by mouth 2 (two) times daily., Disp: 180 tablet, Rfl: 1 .  glipiZIDE (GLUCOTROL) 10 MG tablet, TAKE ONE TABLET BY MOUTH THREE TIMES DAILY BEFORE MEALS, Disp: 270 tablet, Rfl: 1 .  glucose blood (ONE TOUCH ULTRA TEST) test strip, Use 3 (three) times daily One touch ultra test strips e11.29, Disp: , Rfl:  .  insulin NPH Human (HUMULIN N,NOVOLIN N) 100 UNIT/ML injection, Take 15 units BID 12 hours apart (8 am, 8 pm approximately) *Relion brand only please*, Disp: , Rfl:  .  metFORMIN (GLUCOPHAGE) 1000 MG tablet, TAKE 1 TABLET BY MOUTH TWICE DAILY WITH A MEAL, Disp: 180 tablet, Rfl: 0 .  methimazole (TAPAZOLE) 5 MG tablet, Take 5 mg by mouth., Disp: , Rfl:  .  multivitamin-iron-minerals-folic acid (THERAPEUTIC-M) TABS tablet, Take 1 tablet by mouth daily., Disp: , Rfl:  .  omeprazole (PRILOSEC) 40 MG capsule, Take 1 capsule (40 mg total) by mouth daily., Disp: 90 capsule, Rfl: 1 .  oxybutynin (DITROPAN) 5 MG tablet, 1 tablet at bedtime, Disp: 30 tablet, Rfl: 11 .  sertraline (ZOLOFT) 100 MG tablet, Take 1 tablet (100 mg  total) by mouth daily., Disp: 90 tablet, Rfl: 1 .  spironolactone (ALDACTONE) 25 MG tablet, Take 25 mg by mouth daily., Disp: , Rfl:  .  diltiazem (CARDIZEM CD) 120 MG 24 hr capsule, Take 120 mg by mouth daily. , Disp: , Rfl:  .  metoprolol succinate (TOPROL-XL) 100 MG 24 hr tablet, Take 200 mg by mouth daily. , Disp: , Rfl: 0 .  sildenafil (VIAGRA) 100 MG tablet, Take by mouth., Disp: , Rfl:   Review of Systems  Social History   Tobacco Use  . Smoking status: Former Smoker    Quit date: 04/03/1988    Years since quitting: 31.3  . Smokeless tobacco: Never Used  Substance Use Topics  . Alcohol use: Not Currently    Comment: Ocassional alcohol use, Drinks wine.       Objective:   BP 116/76   Pulse 62   Temp 98.5 F (36.9 C) (Temporal)   Resp 20   Ht 5\' 9"  (1.753 m)   Wt 221 lb (100.2 kg)   SpO2 97%   BMI 32.64 kg/m  Vitals:   07/27/19 1516  BP: 116/76  Pulse: 62  Resp: 20  Temp: 98.5 F (36.9 C)  TempSrc: Temporal  SpO2: 97%  Weight: 221 lb (100.2 kg)  Height: 5\' 9"  (1.753 m)  Body mass index is 32.64 kg/m.  Physical Exam Constitutional:      General: He is not in acute distress.    Appearance: He is well-developed.  HENT:     Head: Normocephalic and atraumatic.     Right Ear: Hearing and tympanic membrane normal.     Left Ear: Hearing and tympanic membrane normal.     Nose: Nose normal.     Mouth/Throat:     Comments: Very tender lower central and lateral incisors to tap with tongue blade. Some redness in membranes behind teeth. Very swollen and tender submental area. Eyes:     General: Lids are normal. No scleral icterus.       Right eye: No discharge.        Left eye: No discharge.     Conjunctiva/sclera: Conjunctivae normal.  Neck:     Comments: Tender and swollen submental area with slight redness. Pulmonary:     Effort: Pulmonary effort is normal. No respiratory distress.  Musculoskeletal: Normal range of motion.  Lymphadenopathy:     Cervical: Cervical adenopathy present.  Skin:    Findings: No lesion or rash.  Neurological:     Mental Status: He is alert and oriented to person, place, and time.  Psychiatric:        Speech: Speech normal.        Behavior: Behavior normal.        Thought Content: Thought content normal.       Assessment & Plan    1. Periapical abscess Went to an Urgent Care Clinic for toothache and swelling under the chin for the past 2-3 days. Central and right lateral lower incisors are very tender to tap with tongue blade and no posterior pharynx swelling or redness. Must continue Augmentin and given Tylenol #3 for pain. May use Anbesol drops around the tooth for pain, also. Must proceed to  dentist first of the week as planned. If fever over 100 or pain worsens with difficulty swallowing, will need to go to the ER. Recommend increased fluids and soft diet. - acetaminophen-codeine (TYLENOL #3) 300-30 MG tablet; Take 1 tablet by mouth every 6 (six) hours as needed  for moderate pain.  Dispense: 20 tablet; Refill: Piedra Aguza, PA  [facility]

## 2019-07-30 DIAGNOSIS — I509 Heart failure, unspecified: Secondary | ICD-10-CM | POA: Diagnosis not present

## 2019-07-30 DIAGNOSIS — H2511 Age-related nuclear cataract, right eye: Secondary | ICD-10-CM | POA: Diagnosis not present

## 2019-08-06 ENCOUNTER — Other Ambulatory Visit: Payer: Self-pay | Admitting: Family Medicine

## 2019-08-06 DIAGNOSIS — F329 Major depressive disorder, single episode, unspecified: Secondary | ICD-10-CM

## 2019-08-06 DIAGNOSIS — F32A Depression, unspecified: Secondary | ICD-10-CM

## 2019-08-06 MED ORDER — SERTRALINE HCL 100 MG PO TABS: 100.0000 mg | ORAL_TABLET | Freq: Every day | ORAL | 1 refills | Status: DC

## 2019-08-06 NOTE — Telephone Encounter (Signed)
Tom Moore faxed refill request for the following medications:  sertraline (ZOLOFT) 100 MG tablet    Please advise.

## 2019-08-07 ENCOUNTER — Encounter: Payer: Self-pay | Admitting: *Deleted

## 2019-08-13 ENCOUNTER — Other Ambulatory Visit
Admission: RE | Admit: 2019-08-13 | Discharge: 2019-08-13 | Disposition: A | Discharge status: Home / Self Care | Payer: PPO | Source: Ambulatory Visit | Attending: Ophthalmology | Admitting: Ophthalmology

## 2019-08-13 ENCOUNTER — Ambulatory Visit: Payer: Self-pay | Admitting: Pharmacist

## 2019-08-13 ENCOUNTER — Telehealth: Payer: Self-pay

## 2019-08-13 DIAGNOSIS — Z01812 Encounter for preprocedural laboratory examination: Secondary | ICD-10-CM | POA: Insufficient documentation

## 2019-08-13 DIAGNOSIS — Z20828 Contact with and (suspected) exposure to other viral communicable diseases: Secondary | ICD-10-CM | POA: Diagnosis not present

## 2019-08-13 DIAGNOSIS — N183 Chronic kidney disease, stage 3 unspecified: Secondary | ICD-10-CM

## 2019-08-13 DIAGNOSIS — E1122 Type 2 diabetes mellitus with diabetic chronic kidney disease: Secondary | ICD-10-CM

## 2019-08-13 NOTE — Chronic Care Management (AMB) (Signed)
  Chronic Care Management   Note  08/13/2019 Name: LEMUEL KREINER MRN: [id number] DOB: 05-26-1946  73 y.o. year old male referred to Chronic Care Management clinical pharmacy services for medication assistance. Outreach today to re-apply for Trulicity assistance program for 2021.   Was unable to reach patient via telephone today and have left HIPAA compliant voicemail asking patient to return my call. (unsuccessful outreach #1).  Follow up plan: A HIPPA compliant phone message was left for the patient providing contact information and requesting a return call.  The care management team will reach out to the patient again over the next 5-7 days.   [name], PharmD Clinical Pharmacist [place] [phone]

## 2019-08-14 LAB — SARS CORONAVIRUS 2 (TAT 6-24 HRS): SARS Coronavirus 2: NEGATIVE

## 2019-08-16 ENCOUNTER — Ambulatory Visit
Admission: RE | Admit: 2019-08-16 | Discharge: 2019-08-16 | Disposition: A | Discharge status: Home / Self Care | Payer: PPO | Source: Ambulatory Visit | Attending: Ophthalmology | Admitting: Ophthalmology

## 2019-08-16 ENCOUNTER — Ambulatory Visit: Payer: PPO | Admitting: Anesthesiology

## 2019-08-16 ENCOUNTER — Other Ambulatory Visit: Payer: Self-pay

## 2019-08-16 ENCOUNTER — Encounter
Admission: RE | Disposition: A | Discharge status: Home / Self Care | Payer: Self-pay | Source: Ambulatory Visit | Attending: Ophthalmology

## 2019-08-16 DIAGNOSIS — Z7901 Long term (current) use of anticoagulants: Secondary | ICD-10-CM | POA: Insufficient documentation

## 2019-08-16 DIAGNOSIS — E7800 Pure hypercholesterolemia, unspecified: Secondary | ICD-10-CM | POA: Diagnosis not present

## 2019-08-16 DIAGNOSIS — E1140 Type 2 diabetes mellitus with diabetic neuropathy, unspecified: Secondary | ICD-10-CM | POA: Insufficient documentation

## 2019-08-16 DIAGNOSIS — H2511 Age-related nuclear cataract, right eye: Secondary | ICD-10-CM | POA: Diagnosis not present

## 2019-08-16 DIAGNOSIS — F329 Major depressive disorder, single episode, unspecified: Secondary | ICD-10-CM | POA: Diagnosis not present

## 2019-08-16 DIAGNOSIS — G4730 Sleep apnea, unspecified: Secondary | ICD-10-CM | POA: Diagnosis not present

## 2019-08-16 DIAGNOSIS — E1136 Type 2 diabetes mellitus with diabetic cataract: Secondary | ICD-10-CM | POA: Diagnosis not present

## 2019-08-16 DIAGNOSIS — Z87891 Personal history of nicotine dependence: Secondary | ICD-10-CM | POA: Diagnosis not present

## 2019-08-16 DIAGNOSIS — I5022 Chronic systolic (congestive) heart failure: Secondary | ICD-10-CM | POA: Diagnosis not present

## 2019-08-16 DIAGNOSIS — E785 Hyperlipidemia, unspecified: Secondary | ICD-10-CM | POA: Diagnosis not present

## 2019-08-16 DIAGNOSIS — N183 Chronic kidney disease, stage 3 unspecified: Secondary | ICD-10-CM | POA: Diagnosis not present

## 2019-08-16 DIAGNOSIS — I130 Hypertensive heart and chronic kidney disease with heart failure and stage 1 through stage 4 chronic kidney disease, or unspecified chronic kidney disease: Secondary | ICD-10-CM | POA: Diagnosis not present

## 2019-08-16 DIAGNOSIS — I110 Hypertensive heart disease with heart failure: Secondary | ICD-10-CM | POA: Diagnosis not present

## 2019-08-16 DIAGNOSIS — Z794 Long term (current) use of insulin: Secondary | ICD-10-CM | POA: Diagnosis not present

## 2019-08-16 DIAGNOSIS — F418 Other specified anxiety disorders: Secondary | ICD-10-CM | POA: Diagnosis not present

## 2019-08-16 DIAGNOSIS — K219 Gastro-esophageal reflux disease without esophagitis: Secondary | ICD-10-CM | POA: Diagnosis not present

## 2019-08-16 DIAGNOSIS — I509 Heart failure, unspecified: Secondary | ICD-10-CM | POA: Diagnosis not present

## 2019-08-16 DIAGNOSIS — Z79899 Other long term (current) drug therapy: Secondary | ICD-10-CM | POA: Insufficient documentation

## 2019-08-16 HISTORY — DX: Heart failure, unspecified: I50.9

## 2019-08-16 HISTORY — PX: CATARACT EXTRACTION W/PHACO: SHX586

## 2019-08-16 HISTORY — DX: Orthopnea: R06.01

## 2019-08-16 HISTORY — DX: Myoneural disorder, unspecified: G70.9

## 2019-08-16 LAB — GLUCOSE, CAPILLARY
Glucose-Capillary: 84 mg/dL (ref 70–99)
Glucose-Capillary: 97 mg/dL (ref 70–99)

## 2019-08-16 SURGERY — PHACOEMULSIFICATION, CATARACT, WITH IOL INSERTION
Anesthesia: Monitor Anesthesia Care | Site: Eye | Laterality: Right

## 2019-08-16 MED ORDER — BSS IO SOLN
INTRAOCULAR | Status: DC | PRN
  Administered 2019-08-16: 15 mL via INTRAOCULAR

## 2019-08-16 MED ORDER — MOXIFLOXACIN HCL 0.5 % OP SOLN: 1.0000 [drp] | OPHTHALMIC | Status: DC | PRN

## 2019-08-16 MED ORDER — NEOMYCIN-POLYMYXIN-DEXAMETH 3.5-10000-0.1 OP OINT
TOPICAL_OINTMENT | OPHTHALMIC | Status: DC | PRN
  Administered 2019-08-16: 1 via OPHTHALMIC

## 2019-08-16 MED ORDER — ARMC OPHTHALMIC DILATING DROPS
OPHTHALMIC | Status: AC
  Administered 2019-08-16: 1 via OPHTHALMIC
  Filled 2019-08-16: qty 0.5

## 2019-08-16 MED ORDER — TETRACAINE HCL 0.5 % OP SOLN
OPHTHALMIC | Status: AC
  Filled 2019-08-16: qty 4

## 2019-08-16 MED ORDER — POVIDONE-IODINE 5 % OP SOLN
OPHTHALMIC | Status: AC
  Filled 2019-08-16: qty 150

## 2019-08-16 MED ORDER — LIDOCAINE HCL (PF) 4 % IJ SOLN
INTRAMUSCULAR | Status: AC
  Filled 2019-08-16: qty 20

## 2019-08-16 MED ORDER — NA HYALUR & NA CHOND-NA HYALUR 0.55-0.5 ML IO KIT
PACK | INTRAOCULAR | Status: DC | PRN
  Administered 2019-08-16: 1 via OPHTHALMIC

## 2019-08-16 MED ORDER — DORZOLAMIDE HCL-TIMOLOL MAL 2-0.5 % OP SOLN
OPHTHALMIC | Status: DC | PRN
  Administered 2019-08-16: 1 [drp] via OPHTHALMIC

## 2019-08-16 MED ORDER — POVIDONE-IODINE 5 % OP SOLN
OPHTHALMIC | Status: DC | PRN
  Administered 2019-08-16: 1 via OPHTHALMIC

## 2019-08-16 MED ORDER — EPINEPHRINE PF 1 MG/ML IJ SOLN
INTRAOCULAR | Status: DC | PRN
  Administered 2019-08-16: 08:00:00 via OPHTHALMIC

## 2019-08-16 MED ORDER — TRYPAN BLUE 0.06 % OP SOLN
OPHTHALMIC | Status: AC
  Filled 2019-08-16: qty 2.5

## 2019-08-16 MED ORDER — NEOMYCIN-POLYMYXIN-DEXAMETH 3.5-10000-0.1 OP OINT
TOPICAL_OINTMENT | OPHTHALMIC | Status: AC
  Filled 2019-08-16: qty 7

## 2019-08-16 MED ORDER — LIDOCAINE HCL (PF) 4 % IJ SOLN
INTRAOCULAR | Status: DC | PRN
  Administered 2019-08-16: 4 mL via OPHTHALMIC

## 2019-08-16 MED ORDER — MOXIFLOXACIN HCL 0.5 % OP SOLN
OPHTHALMIC | Status: AC
  Filled 2019-08-16: qty 3

## 2019-08-16 MED ORDER — EPINEPHRINE PF 1 MG/ML IJ SOLN
INTRAMUSCULAR | Status: AC
  Filled 2019-08-16: qty 10

## 2019-08-16 MED ORDER — SODIUM CHLORIDE 0.9 % IV SOLN
INTRAVENOUS | Status: DC
  Administered 2019-08-16 (×2): via INTRAVENOUS

## 2019-08-16 MED ORDER — ARMC OPHTHALMIC DILATING DROPS
1.0000 "application " | OPHTHALMIC | Status: AC
  Administered 2019-08-16 (×3): 1 via OPHTHALMIC

## 2019-08-16 MED ORDER — TRYPAN BLUE 0.06 % OP SOLN
OPHTHALMIC | Status: DC | PRN
  Administered 2019-08-16: 0.5 mL via INTRAOCULAR

## 2019-08-16 MED ORDER — MIDAZOLAM HCL 2 MG/2ML IJ SOLN
INTRAMUSCULAR | Status: DC | PRN
  Administered 2019-08-16 (×2): 1 mg via INTRAVENOUS

## 2019-08-16 MED ORDER — TETRACAINE HCL 0.5 % OP SOLN
1.0000 [drp] | Freq: Once | OPHTHALMIC | Status: AC
  Administered 2019-08-16: 1 [drp] via OPHTHALMIC

## 2019-08-16 MED ORDER — LIDOCAINE HCL (PF) 2 % IJ SOLN
INTRAMUSCULAR | Status: AC
  Filled 2019-08-16: qty 10

## 2019-08-16 MED ORDER — FENTANYL CITRATE (PF) 100 MCG/2ML IJ SOLN
INTRAMUSCULAR | Status: AC
  Filled 2019-08-16: qty 2

## 2019-08-16 MED ORDER — FENTANYL CITRATE (PF) 100 MCG/2ML IJ SOLN
INTRAMUSCULAR | Status: DC | PRN
  Administered 2019-08-16 (×3): 50 ug via INTRAVENOUS

## 2019-08-16 MED ORDER — NA CHONDROIT SULF-NA HYALURON 40-17 MG/ML IO SOLN
INTRAOCULAR | Status: AC
  Filled 2019-08-16: qty 5

## 2019-08-16 MED ORDER — NA CHONDROIT SULF-NA HYALURON 40-17 MG/ML IO SOLN
INTRAOCULAR | Status: DC | PRN
  Administered 2019-08-16: 1 mL via INTRAOCULAR

## 2019-08-16 MED ORDER — MIDAZOLAM HCL 2 MG/2ML IJ SOLN
INTRAMUSCULAR | Status: AC
  Filled 2019-08-16: qty 2

## 2019-08-16 MED ORDER — MOXIFLOXACIN HCL 0.5 % OP SOLN
OPHTHALMIC | Status: DC | PRN
  Administered 2019-08-16: 0.2 mL via OPHTHALMIC

## 2019-08-16 SURGICAL SUPPLY — 18 items
BNDG EYE OVAL (GAUZE/BANDAGES/DRESSINGS) ×2 IMPLANT
DISSECTOR HYDRO NUCLEUS 50X22 (MISCELLANEOUS) ×8 IMPLANT
DRSG TEGADERM 2-3/8X2-3/4 SM (GAUZE/BANDAGES/DRESSINGS) ×2 IMPLANT
GLOVE BIOGEL M 6.5 STRL (GLOVE) ×2 IMPLANT
GOWN STRL REUS W/ TWL LRG LVL3 (GOWN DISPOSABLE) ×1 IMPLANT
GOWN STRL REUS W/ TWL XL LVL3 (GOWN DISPOSABLE) ×1 IMPLANT
GOWN STRL REUS W/TWL LRG LVL3 (GOWN DISPOSABLE) ×2
GOWN STRL REUS W/TWL XL LVL3 (GOWN DISPOSABLE) ×2
KNIFE 45D UP 2.3 (MISCELLANEOUS) ×2 IMPLANT
LABEL CATARACT MEDS ST (LABEL) ×2 IMPLANT
LENS IOL TECNIS ITEC 18.0 (Intraocular Lens) ×1 IMPLANT
PACK CATARACT (MISCELLANEOUS) ×2 IMPLANT
PACK CATARACT KING (MISCELLANEOUS) ×2 IMPLANT
PACK EYE AFTER SURG (MISCELLANEOUS) ×2 IMPLANT
SOL BSS BAG (MISCELLANEOUS) ×2
SOLUTION BSS BAG (MISCELLANEOUS) ×1 IMPLANT
WATER STERILE IRR 250ML POUR (IV SOLUTION) ×2 IMPLANT
WIPE NON LINTING 3.25X3.25 (MISCELLANEOUS) ×2 IMPLANT

## 2019-08-16 NOTE — Transfer of Care (Signed)
Immediate Anesthesia Transfer of Care Note  Patient: Tom Moore  Procedure(s) Performed: CATARACT EXTRACTION PHACO AND INTRAOCULAR LENS PLACEMENT (IOC) RIGHT (Right Eye)  Patient Location: PACU  Anesthesia Type:MAC  Level of Consciousness: awake, alert  and oriented  Airway & Oxygen Therapy: Patient Spontanous Breathing  Post-op Assessment: Report given to RN and Post -op Vital signs reviewed and stable  Post vital signs: Reviewed and stable  Last Vitals:  Vitals Value Taken Time  BP 129/74 08/16/19 0820  Temp 36.2 C 08/16/19 0820  Pulse 67 08/16/19 0820  Resp 18 08/16/19 0820  SpO2 94 % 08/16/19 0820    Last Pain:  Vitals:   08/16/19 0820  TempSrc: Temporal  PainSc: 0-No pain         Complications: No apparent anesthesia complications

## 2019-08-16 NOTE — Anesthesia Preprocedure Evaluation (Addendum)
Anesthesia Evaluation  Patient identified by MRN, date of birth, ID band Patient awake    Reviewed: Allergy & Precautions, H&P , NPO status , reviewed documented beta blocker date and time   Airway Mallampati: III   Neck ROM: full    Dental  (+) Edentulous Upper, Poor Dentition, Chipped, Caps, Upper Dentures   Pulmonary sleep apnea , former smoker,    Pulmonary exam normal        Cardiovascular hypertension, +CHF and + Orthopnea  Normal cardiovascular exam+ dysrhythmias   PVCs on EKG   Neuro/Psych PSYCHIATRIC DISORDERS Anxiety Depression  Neuromuscular disease    GI/Hepatic GERD  Medicated and Controlled,  Endo/Other  diabetes, Type 2Hyperthyroidism   Renal/GU Renal disease     Musculoskeletal  (+) Arthritis ,   Abdominal   Peds  Hematology   Anesthesia Other Findings Past Medical History: No date: Anxiety No date: Arthritis No date: CHF (congestive heart failure) (HCC) No date: Depression No date: Diabetes mellitus without complication (HCC) No date: Dysrhythmia     Comment:  atrial fib No date: GERD (gastroesophageal reflux disease) No date: Hyperlipidemia No date: Hypertension No date: Hyperthyroidism No date: Insomnia No date: Neuromuscular disorder ([place])     Comment:  diabetic neuropathy No date: Orthopnea     Comment:  uses extra pillow to sleep No date: Sleep apnea     Comment:  unable to tolerater CPAP  Past Surgical History: No date: CARDIAC CATHETERIZATION 04/10/2018: CARPAL TUNNEL RELEASE; Right     Comment:  Procedure: RELEASE MEDIAN NERVE AT CARPAL TUNNEL;                Surgeon: Earnestine Leys, MD;  Location: [facility] ORS;                Service: Orthopedics;  Laterality: Right; 05/08/2018: CARPAL TUNNEL RELEASE; Left     Comment:  Procedure: CARPAL TUNNEL RELEASE;  Surgeon: Earnestine Leys, MD;  Location: [facility] ORS;  Service: Orthopedics;                Laterality: Left; No  date: TONSILLECTOMY  BMI    Body Mass Index: 32.64 kg/m      Reproductive/Obstetrics                            Anesthesia Physical Anesthesia Plan  ASA: III  Anesthesia Plan: MAC   Post-op Pain Management:    Induction: Intravenous  PONV Risk Score and Plan: TIVA and Treatment may vary due to age or medical condition  Airway Management Planned: Nasal Cannula and Natural Airway  Additional Equipment:   Intra-op Plan:   Post-operative Plan:   Informed Consent: I have reviewed the patients History and Physical, chart, labs and discussed the procedure including the risks, benefits and alternatives for the proposed anesthesia with the patient or authorized representative who has indicated his/her understanding and acceptance.     Dental Advisory Given  Plan Discussed with: CRNA  Anesthesia Plan Comments:         Anesthesia Quick Evaluation

## 2019-08-16 NOTE — Op Note (Signed)
  PREOPERATIVE DIAGNOSIS:  Nuclear sclerotic cataract of the RIGHT eye.   POSTOPERATIVE DIAGNOSIS:  Nuclear sclerotic cataract of the RIGHT eye.   OPERATIVE PROCEDURE: Cataract surgery OD   SURGEON:  Marchia Meiers, MD.   ANESTHESIA:  Anesthesiologist: Alphonsus Sias, MD CRNA: Allean Found, CRNA  1.      Managed anesthesia care. 2.     0.38ml of Shugarcaine was instilled following the paracentesis   COMPLICATIONS:  None.   TECHNIQUE:   Divide and conquer   DESCRIPTION OF PROCEDURE:  The patient was examined and consented in the preoperative holding area where the aforementioned topical anesthesia was applied to the RIGHT eye and then brought back to the Operating Room where the RIGHT eye was prepped and draped in the usual sterile ophthalmic fashion and a lid speculum was placed. A paracentesis was created with the side port blade, the anterior chamber was washed out with trypan blue to stain the anterior capsule, and the anterior chamber was filled with viscoelastic. A near clear corneal incision was performed with the steel keratome. A continuous curvilinear capsulorrhexis was performed with a cystotome followed by the capsulorrhexis forceps. Hydrodissection and hydrodelineation were carried out with BSS on a blunt cannula. The lens was removed in a divide and conquer  technique and the remaining cortical material was removed with the irrigation-aspiration handpiece. The capsular bag was inflated with viscoelastic and the lens was placed in the capsular bag without complication. The remaining viscoelastic was removed from the eye with the irrigation-aspiration handpiece. The wounds were hydrated. The anterior chamber was flushed and the eye was inflated to physiologic pressure. 0.34ml Vigamox was placed in the anterior chamber. The wounds were found to be water tight. The eye was dressed with Vigamox. The patient was given protective glasses to wear throughout the day and a shield with which to  sleep tonight. The patient was also given drops with which to begin a drop regimen today and will follow-up with me in one day. Implant Name Type Inv. Item Serial No. Manufacturer Lot No. LRB No. Used Action  LENS IOL DIOP 18.0 - [id number] Intraocular Lens LENS IOL DIOP 18.0 [id number] AMO  Right 1 Implanted    Procedure(s) with comments: CATARACT EXTRACTION PHACO AND INTRAOCULAR LENS PLACEMENT (IOC) RIGHT (Right) - [place] 01:30.3 CDE 13.71 Fluid Pack lot # [id number]  Electronically signed: Marchia Meiers 08/16/2019 12:37 PM

## 2019-08-16 NOTE — Anesthesia Postprocedure Evaluation (Signed)
Anesthesia Post Note  Patient: Tom Moore  Procedure(s) Performed: CATARACT EXTRACTION PHACO AND INTRAOCULAR LENS PLACEMENT (IOC) RIGHT (Right Eye)  Patient location during evaluation: Phase II Anesthesia Type: MAC Level of consciousness: awake and alert Pain management: pain level controlled Vital Signs Assessment: post-procedure vital signs reviewed and stable Respiratory status: spontaneous breathing, nonlabored ventilation and respiratory function stable Cardiovascular status: blood pressure returned to baseline and stable Postop Assessment: no apparent nausea or vomiting Anesthetic complications: no     Last Vitals:  Vitals:   08/16/19 0601 08/16/19 0820  BP: 128/80 129/74  Pulse: 72 67  Resp:  18  Temp: (!) 36.3 C (!) 36.2 C  SpO2:  94%    Last Pain:  Vitals:   08/16/19 0820  TempSrc: Temporal  PainSc: 0-No pain                 Alphonsus Sias

## 2019-08-16 NOTE — OR Nursing (Signed)
Dr Lavone Neri notified of am blood sugar no new orders at this time. Patient denies any s/s of low blood sugar.  Patient reports that this is a" normal" am blood sugar for him.

## 2019-08-16 NOTE — H&P (Signed)
   I have reviewed the patient's H&P and agree with its findings. There have been no interval changes.  Deola Rewis MD Ophthalmology 

## 2019-08-16 NOTE — Anesthesia Post-op Follow-up Note (Signed)
Anesthesia QCD[facility] form completed.        

## 2019-08-16 NOTE — Discharge Instructions (Signed)
Eye Surgery Discharge Instructions    Expect mild scratchy sensation or mild soreness. DO NOT RUB YOUR EYE!  The day of surgery:  Minimal physical activity, but bed rest is not required  No reading, computer work, or close hand work  No bending, lifting, or straining.  May watch TV  For 24 hours:  No driving, legal decisions, or alcoholic beverages  Safety precautions  Eat anything you prefer: It is better to start with liquids, then soup then solid foods.  _____ Eye patch should be worn until postoperative exam tomorrow.  ____ Solar shield eyeglasses should be worn for comfort in the sunlight/patch while sleeping  Resume all regular medications including aspirin or Coumadin if these were discontinued prior to surgery. You may shower, bathe, shave, or wash your hair. Tylenol may be taken for mild discomfort.  Call your doctor if you experience significant pain, nausea, or vomiting, fever > 101 or other signs of infection. [phone] or [phone] Specific instructions:  Follow-up Information    Marchia Meiers, MD Follow up on 08/17/2019.   Specialty: Ophthalmology Why: @ 10:15 am for post op visit Contact information: [place] [address] [phone]

## 2019-08-16 NOTE — Anesthesia Procedure Notes (Signed)
Procedure Name: MAC Date/Time: 08/16/2019 7:30 AM Performed by: Allean Found, CRNA Pre-anesthesia Checklist: Patient identified, Emergency Drugs available, Suction available, Patient being monitored and Timeout performed Patient Re-evaluated:Patient Re-evaluated prior to induction Oxygen Delivery Method: Nasal cannula

## 2019-08-20 ENCOUNTER — Ambulatory Visit: Payer: Self-pay | Admitting: Pharmacist

## 2019-08-20 DIAGNOSIS — N183 Chronic kidney disease, stage 3 unspecified: Secondary | ICD-10-CM

## 2019-08-20 DIAGNOSIS — E1122 Type 2 diabetes mellitus with diabetic chronic kidney disease: Secondary | ICD-10-CM

## 2019-08-20 NOTE — Chronic Care Management (AMB) (Signed)
  Chronic Care Management   Care Coordination Note  08/20/2019 Name: ILAY VERHOFF MRN: [id number] DOB: November 03, 1945  Care Coordination: faxed patient's [employer] application for Trulicity and Humulin to Dr. Lucilla Lame for prescriber portion completion and submission to [place]. Copy uploaded to media tab.   Goals Addressed            This Visit's Progress   . 123XX123 Trulicity assistance (pt-stated)       Current Barriers:  . financial  Pharmacist Clinical Goal(s): Over the next 14 days, Mr.. Garde will provide the necessary supplementary documents (proof of out of pocket prescription expenditure, proof of household income) needed for medication assistance applications to CCM pharmacist.   Interventions: . CCM pharmacist will apply for medication assistance program for Trulicity, Humulin made by [employer] and prescribed by Dr. Lucilla Lame.   Patient Self Care Activities:  Marland Kitchen Gather necessary documents needed to apply for medication assistance  Initial goal documentation         Follow up plan: Telephone follow up appointment with care management team member scheduled for: 4 weeks  [name], PharmD Clinical Pharmacist [place] [phone]

## 2019-08-27 ENCOUNTER — Ambulatory Visit (INDEPENDENT_AMBULATORY_CARE_PROVIDER_SITE_OTHER): Payer: PPO | Admitting: Urology

## 2019-08-27 ENCOUNTER — Other Ambulatory Visit: Payer: Self-pay | Admitting: Family Medicine

## 2019-08-27 ENCOUNTER — Other Ambulatory Visit: Payer: Self-pay

## 2019-08-27 ENCOUNTER — Encounter: Payer: Self-pay | Admitting: Urology

## 2019-08-27 VITALS — BP 126/82 | HR 76 | Ht 69.0 in | Wt 220.0 lb

## 2019-08-27 DIAGNOSIS — R350 Frequency of micturition: Secondary | ICD-10-CM | POA: Diagnosis not present

## 2019-08-27 DIAGNOSIS — R351 Nocturia: Secondary | ICD-10-CM | POA: Diagnosis not present

## 2019-08-27 DIAGNOSIS — N401 Enlarged prostate with lower urinary tract symptoms: Secondary | ICD-10-CM

## 2019-08-27 MED ORDER — DILTIAZEM HCL ER COATED BEADS 120 MG PO CP24: 120.0000 mg | ORAL_CAPSULE | Freq: Every day | ORAL | 34 refills | Status: DC

## 2019-08-27 MED ORDER — DUTASTERIDE 0.5 MG PO CAPS: 0.5000 mg | ORAL_CAPSULE | Freq: Every day | ORAL | 1 refills | Status: DC

## 2019-08-27 MED ORDER — TAMSULOSIN HCL 0.4 MG PO CAPS: 0.8000 mg | ORAL_CAPSULE | Freq: Every day | ORAL | 0 refills | Status: DC

## 2019-08-27 MED ORDER — OXYBUTYNIN CHLORIDE 5 MG PO TABS: ORAL_TABLET | ORAL | 11 refills | Status: DC

## 2019-08-27 NOTE — Telephone Encounter (Signed)
From [facility] 

## 2019-08-27 NOTE — Progress Notes (Signed)
08/27/2019 11:38 AM   Tom Moore 1946/09/05 [id number]  Referring provider: Carmon Ginsberg, PA No address on file  Chief Complaint  Patient presents with  . Follow-up    HPI: 73 y.o. male presents for annual follow-up.  He was initially seen for nocturia x4-5.  He noted marked improvement in his symptoms on Nocdurna however the medication was cost prohibitive.  He is currently taking immediate release oxybutynin at bedtime.  At his visit last year he states his nocturia was down at once per night however over the last several months he is getting up 2-3 times per night.  He also notes some increased urinary frequency and occasional episodes of urge incontinence.  Along with straining to urinate. Denies dysuria or gross hematuria.  He has no flank, abdominal or pelvic pain.  Denies history of sleep apnea/snoring.   PMH: Past Medical History:  Diagnosis Date  . Anxiety   . Arthritis   . CHF (congestive heart failure) ([place])   . Depression   . Diabetes mellitus without complication ([place])   . Dysrhythmia    atrial fib  . GERD (gastroesophageal reflux disease)   . Hyperlipidemia   . Hypertension   . Hyperthyroidism   . Insomnia   . Neuromuscular disorder ([place])    diabetic neuropathy  . Orthopnea    uses extra pillow to sleep  . Sleep apnea    unable to tolerater CPAP    Surgical History: Past Surgical History:  Procedure Laterality Date  . CARDIAC CATHETERIZATION    . CARPAL TUNNEL RELEASE Right 04/10/2018   Procedure: RELEASE MEDIAN NERVE AT CARPAL TUNNEL;  Surgeon: Earnestine Leys, MD;  Location: [facility] ORS;  Service: Orthopedics;  Laterality: Right;  . CARPAL TUNNEL RELEASE Left 05/08/2018   Procedure: CARPAL TUNNEL RELEASE;  Surgeon: Earnestine Leys, MD;  Location: [facility] ORS;  Service: Orthopedics;  Laterality: Left;  . CATARACT EXTRACTION W/PHACO Right 08/16/2019   Procedure: CATARACT EXTRACTION PHACO AND INTRAOCULAR LENS PLACEMENT ([place]) RIGHT;  Surgeon: Marchia Meiers, MD;  Location: [facility] ORS;  Service: Ophthalmology;  Laterality: Right;  [place] 01:30.3 CDE 13.71 Fluid Pack lot # [id number]  . TONSILLECTOMY      Home Medications:  Allergies as of 08/27/2019      Reactions   Zyrtec Allergy [cetirizine] Diarrhea      Medication List       Accurate as of August 27, 2019 11:38 AM. If you have any questions, ask your nurse or doctor.        acetaminophen-codeine 300-30 MG tablet Commonly known as: TYLENOL #3 Take 1 tablet by mouth every 6 (six) hours as needed for moderate pain.   apixaban 5 MG Tabs tablet Commonly known as: ELIQUIS Take 5 mg by mouth daily.   atorvastatin 40 MG tablet Commonly known as: LIPITOR Take 40 mg by mouth daily.   buPROPion 75 MG tablet Commonly known as: WELLBUTRIN Take 1 tablet (75 mg total) by mouth 2 (two) times daily.   diltiazem 120 MG 24 hr capsule Commonly known as: CARDIZEM CD Take 120 mg by mouth daily.   furosemide 40 MG tablet Commonly known as: LASIX Take 1 tablet (40 mg total) by mouth 2 (two) times daily. What changed: when to take this   gabapentin 400 MG capsule Commonly known as: NEURONTIN Take 400 mg by mouth daily. AT LUNCH   glipiZIDE 10 MG tablet Commonly known as: GLUCOTROL TAKE ONE TABLET BY MOUTH THREE TIMES DAILY BEFORE MEALS What changed:  how much to take  when to take this  additional instructions   insulin NPH Human 100 UNIT/ML injection Commonly known as: NOVOLIN N 28 Units 2 (two) times daily before a meal.   lisinopril 40 MG tablet Commonly known as: ZESTRIL   metFORMIN 1000 MG tablet Commonly known as: GLUCOPHAGE TAKE 1 TABLET BY MOUTH TWICE DAILY WITH A MEAL What changed: See the new instructions.   methimazole 5 MG tablet Commonly known as: TAPAZOLE Take 5 mg by mouth.   metoprolol succinate 100 MG 24 hr tablet Commonly known as: TOPROL-XL Take 100 mg by mouth daily.   multivitamin-iron-minerals-folic acid Tabs tablet Take 1 tablet by mouth  daily.   omeprazole 40 MG capsule Commonly known as: PRILOSEC Take 1 capsule (40 mg total) by mouth daily.   ONE TOUCH ULTRA TEST test strip Generic drug: glucose blood Use 3 (three) times daily One touch ultra test strips e11.29   oxybutynin 5 MG tablet Commonly known as: DITROPAN 1 tablet at bedtime   sertraline 100 MG tablet Commonly known as: ZOLOFT Take 1 tablet (100 mg total) by mouth daily. What changed:   how much to take  when to take this   sildenafil 100 MG tablet Commonly known as: VIAGRA Take by mouth.   spironolactone 25 MG tablet Commonly known as: ALDACTONE Take 25 mg by mouth daily.   tamsulosin 0.4 MG Caps capsule Commonly known as: FLOMAX Take 0.4 mg by mouth daily. With lunch   Trulicity 1.5 0000000 Sopn Generic drug: Dulaglutide Inject into the skin once a week.   venlafaxine XR 150 MG 24 hr capsule Commonly known as: EFFEXOR-XR Take 150 mg by mouth daily. At lunch       Allergies:  Allergies  Allergen Reactions  . Zyrtec Allergy [Cetirizine] Diarrhea    Family History: Family History  Problem Relation Age of Onset  . Hypertension Mother   . Heart disease Mother     Social History:  reports that he quit smoking about 31 years ago. He has never used smokeless tobacco. He reports previous alcohol use. He reports that he does not use drugs.  ROS: UROLOGY Frequent Urination?: Yes Hard to postpone urination?: Yes Burning/pain with urination?: No Get up at night to urinate?: Yes Leakage of urine?: Yes Urine stream starts and stops?: No Trouble starting stream?: Yes Do you have to strain to urinate?: No Blood in urine?: No Urinary tract infection?: No Sexually transmitted disease?: No Injury to kidneys or bladder?: No Painful intercourse?: No Weak stream?: No Erection problems?: Yes Penile pain?: No  Gastrointestinal Nausea?: No Vomiting?: No Indigestion/heartburn?: No Diarrhea?: No Constipation?: No  Constitutional  Fever: No Night sweats?: No Weight loss?: No Fatigue?: No  Skin Skin rash/lesions?: No Itching?: No  Eyes Blurred vision?: No Double vision?: No  Ears/Nose/Throat Sore throat?: No Sinus problems?: No  Hematologic/Lymphatic Swollen glands?: No Easy bruising?: No  Cardiovascular Leg swelling?: No Chest pain?: No  Respiratory Cough?: No Shortness of breath?: No  Endocrine Excessive thirst?: No  Musculoskeletal Back pain?: No Joint pain?: No  Neurological Headaches?: No Dizziness?: No  Psychologic Depression?: Yes Anxiety?: No  Physical Exam: BP 126/82   Pulse 76   Ht 5\' 9"  (1.753 m)   Wt 220 lb (99.8 kg)   BMI 32.49 kg/m   Constitutional:  Alert and oriented, No acute distress. HEENT: [place] AT, moist mucus membranes.  Trachea midline, no masses. Cardiovascular: No clubbing, cyanosis, or edema. Respiratory: Normal respiratory effort, no increased work of  breathing. Skin: No rashes, bruises or suspicious lesions. Neurologic: Grossly intact, no focal deficits, moving all 4 extremities. Psychiatric: Normal mood and affect.   Assessment & Plan:    - BPH with lower urinary tract symptoms He has noted increased storage related symptoms.  Will increase tamsulosin to 0.8 mg daily.  He will not be back in the area until January and will send in a 90-day Rx.  If no improvement would recommend a trial of Myrbetriq.  - Nocturia Continue IR oxybutynin.  Refill sent  Continue annual follow-up.   Abbie Sons, [place] [address] [place], [place] [address] [phone]

## 2019-08-27 NOTE — Addendum Note (Signed)
Addended by: Judie Petit on: 08/27/2019 11:52 AM   Modules accepted: Orders

## 2019-08-27 NOTE — Telephone Encounter (Signed)
rx refill tamsulosin (FLOMAX) 0.4 MG CAPS capsule  diltiazem (CARDIZEM CD) 120 MG 24 hr capsule   [facility] [address], [place] [phone] (Phone) [phone] (Fax)

## 2019-09-03 ENCOUNTER — Other Ambulatory Visit: Payer: Self-pay | Admitting: Family Medicine

## 2019-09-10 ENCOUNTER — Other Ambulatory Visit: Payer: Self-pay

## 2019-09-10 ENCOUNTER — Encounter: Payer: Self-pay | Admitting: Family Medicine

## 2019-09-10 ENCOUNTER — Ambulatory Visit (INDEPENDENT_AMBULATORY_CARE_PROVIDER_SITE_OTHER): Payer: PPO | Admitting: Family Medicine

## 2019-09-10 VITALS — BP 137/82 | HR 71 | Temp 96.9°F | Resp 16 | Ht 69.0 in | Wt 227.0 lb

## 2019-09-10 DIAGNOSIS — F324 Major depressive disorder, single episode, in partial remission: Secondary | ICD-10-CM | POA: Diagnosis not present

## 2019-09-10 DIAGNOSIS — N183 Chronic kidney disease, stage 3 unspecified: Secondary | ICD-10-CM

## 2019-09-10 DIAGNOSIS — E1122 Type 2 diabetes mellitus with diabetic chronic kidney disease: Secondary | ICD-10-CM | POA: Diagnosis not present

## 2019-09-10 DIAGNOSIS — Z20828 Contact with and (suspected) exposure to other viral communicable diseases: Secondary | ICD-10-CM | POA: Diagnosis not present

## 2019-09-10 DIAGNOSIS — Z20822 Contact with and (suspected) exposure to covid-19: Secondary | ICD-10-CM

## 2019-09-10 DIAGNOSIS — I480 Paroxysmal atrial fibrillation: Secondary | ICD-10-CM

## 2019-09-10 NOTE — Progress Notes (Signed)
Patient: Tom Moore Male    DOB: 1946-05-16   73 y.o.   MRN: [id number] Visit Date: 09/10/2019  Today's Provider: [name], MD   No chief complaint on file.  Subjective:     HPI     Patient is here concerning mole on his left cheek and on his forehead. It is irritated. He needs Covid testing for travel over Christmas.   Allergies  Allergen Reactions  . Zyrtec Allergy [Cetirizine] Diarrhea     Current Outpatient Medications:  .  acetaminophen-codeine (TYLENOL #3) 300-30 MG tablet, Take 1 tablet by mouth every 6 (six) hours as needed for moderate pain., Disp: 20 tablet, Rfl: 0 .  apixaban (ELIQUIS) 5 MG TABS tablet, Take 5 mg by mouth daily. , Disp: , Rfl:  .  atorvastatin (LIPITOR) 40 MG tablet, Take 40 mg by mouth daily., Disp: , Rfl:  .  buPROPion (WELLBUTRIN) 75 MG tablet, Take 1 tablet (75 mg total) by mouth 2 (two) times daily., Disp: 60 tablet, Rfl: 5 .  diltiazem (CARDIZEM CD) 120 MG 24 hr capsule, Take 1 capsule (120 mg total) by mouth daily., Disp: 30 capsule, Rfl: 34 .  dutasteride (AVODART) 0.5 MG capsule, Take 1 capsule (0.5 mg total) by mouth daily., Disp: 90 capsule, Rfl: 1 .  furosemide (LASIX) 40 MG tablet, Take 1 tablet (40 mg total) by mouth 2 (two) times daily. (Patient taking differently: Take 40 mg by mouth daily. ), Disp: 180 tablet, Rfl: 1 .  gabapentin (NEURONTIN) 400 MG capsule, Take 400 mg by mouth daily. AT LUNCH, Disp: , Rfl:  .  glipiZIDE (GLUCOTROL) 10 MG tablet, TAKE ONE TABLET BY MOUTH THREE TIMES DAILY BEFORE MEALS (Patient taking differently: 10 mg 2 (two) times daily before a meal. ), Disp: 270 tablet, Rfl: 1 .  glucose blood (ONE TOUCH ULTRA TEST) test strip, Use 3 (three) times daily One touch ultra test strips e11.29, Disp: , Rfl:  .  insulin NPH Human (HUMULIN N,NOVOLIN N) 100 UNIT/ML injection, 28 Units 2 (two) times daily before a meal. , Disp: , Rfl:  .  lisinopril (ZESTRIL) 40 MG tablet, , Disp: , Rfl:  .   metFORMIN (GLUCOPHAGE) 1000 MG tablet, TAKE 1 TABLET BY MOUTH TWICE DAILY WITH A MEAL (Patient taking differently: daily with breakfast. ), Disp: 180 tablet, Rfl: 0 .  methimazole (TAPAZOLE) 5 MG tablet, Take 5 mg by mouth., Disp: , Rfl:  .  metoprolol succinate (TOPROL-XL) 100 MG 24 hr tablet, Take 100 mg by mouth daily. , Disp: , Rfl: 0 .  multivitamin-iron-minerals-folic acid (THERAPEUTIC-M) TABS tablet, Take 1 tablet by mouth daily., Disp: , Rfl:  .  omeprazole (PRILOSEC) 40 MG capsule, Take 1 capsule (40 mg total) by mouth daily., Disp: 90 capsule, Rfl: 1 .  oxybutynin (DITROPAN) 5 MG tablet, 1 tablet at bedtime, Disp: 30 tablet, Rfl: 11 .  sertraline (ZOLOFT) 100 MG tablet, Take 1 tablet (100 mg total) by mouth daily. (Patient taking differently: Take 50 mg by mouth at bedtime. ), Disp: 90 tablet, Rfl: 1 .  sildenafil (VIAGRA) 100 MG tablet, Take by mouth., Disp: , Rfl:  .  spironolactone (ALDACTONE) 25 MG tablet, Take 25 mg by mouth daily., Disp: , Rfl:  .  TRULICITY 1.5 0000000 SOPN, Inject into the skin once a week., Disp: , Rfl:  .  venlafaxine XR (EFFEXOR-XR) 150 MG 24 hr capsule, TAKE 1 CAPSULE BY MOUTH ONCE DAILY WITH BREAKFAST, Disp: 90 capsule,  Rfl: 0  Review of Systems  Constitutional: Negative for appetite change, chills and fever.  Eyes: Negative.   Respiratory: Negative for chest tightness, shortness of breath and wheezing.   Cardiovascular: Negative for chest pain and palpitations.  Gastrointestinal: Negative for abdominal pain, nausea and vomiting.  Endocrine: Negative.   Genitourinary: Negative.   Skin: Positive for rash.       Irritated SK on cheek.  Allergic/Immunologic: Negative.   Hematological: Negative.   Psychiatric/Behavioral: Negative.     Social History   Tobacco Use  . Smoking status: Former Smoker    Quit date: 04/03/1988    Years since quitting: 31.4  . Smokeless tobacco: Never Used  Substance Use Topics  . Alcohol use: Not Currently    Comment:  Ocassional alcohol use, Drinks wine.      Objective:   BP 137/82 (BP Location: Right Arm, Patient Position: Sitting, Cuff Size: Large)   Pulse 71   Temp (!) 96.9 F (36.1 C) (Other (Comment))   Resp 16   Ht 5\' 9"  (1.753 m)   Wt 227 lb (103 kg)   SpO2 97%   BMI 33.52 kg/m  Vitals:   09/10/19 0957  BP: 137/82  Pulse: 71  Resp: 16  Temp: (!) 96.9 F (36.1 C)  TempSrc: Other (Comment)  SpO2: 97%  Weight: 227 lb (103 kg)  Height: 5\' 9"  (1.753 m)  Body mass index is 33.52 kg/m.   Physical Exam Vitals reviewed.  Constitutional:      Appearance: Normal appearance.  HENT:     Head: Normocephalic and atraumatic.     Right Ear: External ear normal.     Left Ear: External ear normal.     Nose: Nose normal.     Mouth/Throat:     Pharynx: Oropharynx is clear.  Eyes:     General: No scleral icterus. Cardiovascular:     Rate and Rhythm: Normal rate and regular rhythm.     Pulses: Normal pulses.     Heart sounds: Normal heart sounds.  Pulmonary:     Effort: Pulmonary effort is normal.     Breath sounds: Normal breath sounds.  Abdominal:     General: Abdomen is flat.     Palpations: Abdomen is soft.  Musculoskeletal:     Right lower leg: No edema.     Left lower leg: No edema.  Skin:    General: Skin is dry.     Comments: Irritated SK on the cheek is frozen with cryo pen.  Neurological:     General: No focal deficit present.     Mental Status: He is alert and oriented to person, place, and time. Mental status is at baseline.  Psychiatric:        Mood and Affect: Mood normal.        Behavior: Behavior normal.        Thought Content: Thought content normal.        Judgment: Judgment normal.      No results found for any visits on 09/10/19.     Assessment & Plan    1. Encounter for screening laboratory testing for COVID-19 virus  - COVID-19,   2. Paroxysmal atrial fibrillation (HCC)   3. Controlled type 2 diabetes mellitus with stage 3 chronic kidney  disease, unspecified whether long term insulin use (HCC) A1c on next visit.  4. CKD stage 3 due to type 2 diabetes mellitus ([place])   5. Major depressive disorder with single episode, in partial  remission ([place]) In partial remission, doing well. 6.Irritated SK Frozen with cryo pen    [name], MD  [facility]

## 2019-09-29 ENCOUNTER — Other Ambulatory Visit: Payer: Self-pay | Admitting: Family Medicine

## 2019-10-01 ENCOUNTER — Ambulatory Visit: Payer: Self-pay | Admitting: Pharmacist

## 2019-10-01 DIAGNOSIS — N183 Chronic kidney disease, stage 3 unspecified: Secondary | ICD-10-CM

## 2019-10-01 DIAGNOSIS — E1122 Type 2 diabetes mellitus with diabetic chronic kidney disease: Secondary | ICD-10-CM

## 2019-10-01 NOTE — Patient Instructions (Signed)
Goals Addressed            This Visit's Progress   . COMPLETED: 123XX123 Trulicity assistance (pt-stated)       Current Barriers:  . financial  Pharmacist Clinical Goal(s): Over the next 14 days, Mr.. Dewing will provide the necessary supplementary documents (proof of out of pocket prescription expenditure, proof of household income) needed for medication assistance applications to CCM pharmacist.   Interventions: . CCM pharmacist will apply for medication assistance program for Trulicity, Humulin made by [employer] and prescribed by Dr. Lucilla Lame.  Marland Kitchen Updated 1/4: patient returned signed application and 123XX123 Social Security benefits statement; he would like medication delivered to practice  Patient Self Care Activities:  Marland Kitchen Gather necessary documents needed to apply for medication assistance  Please see past updates related to this goal by clicking on the "Past Updates" button in the selected goal

## 2019-10-01 NOTE — Chronic Care Management (AMB) (Signed)
  Chronic Care Management   Care Coordination Note  10/01/2019 Name: Tom Moore MRN: [id number] DOB: 1946-06-19  Care Coordination: patient stopped by [facility] to drop off 2021 Social Security benefit statement.   Goals Addressed            This Visit's Progress   . COMPLETED: 123XX123 Trulicity assistance (pt-stated)       Current Barriers:  . financial  Pharmacist Clinical Goal(s): Over the next 14 days, Mr.. Pask will provide the necessary supplementary documents (proof of out of pocket prescription expenditure, proof of household income) needed for medication assistance applications to CCM pharmacist.   Interventions: . CCM pharmacist will apply for medication assistance program for Trulicity, Humulin made by [employer] and prescribed by Dr. Lucilla Lame.  Marland Kitchen Updated 1/4: patient returned signed application and 123XX123 Social Security benefits statement; he would like medication delivered to practice  Patient Self Care Activities:  Marland Kitchen Gather necessary documents needed to apply for medication assistance  Please see past updates related to this goal by clicking on the "Past Updates" button in the selected goal           Follow up plan: Telephone follow up appointment with care management team member scheduled for: within 30 days for medication assessment and DM management.   [name], PharmD Clinical Pharmacist [place] [phone]

## 2019-10-15 ENCOUNTER — Ambulatory Visit: Payer: Self-pay | Admitting: Pharmacist

## 2019-10-15 DIAGNOSIS — N183 Chronic kidney disease, stage 3 unspecified: Secondary | ICD-10-CM

## 2019-10-15 DIAGNOSIS — E1122 Type 2 diabetes mellitus with diabetic chronic kidney disease: Secondary | ICD-10-CM

## 2019-10-15 NOTE — Chronic Care Management (AMB) (Signed)
  Chronic Care Management   Note  10/15/2019 Name: Tom Moore MRN: [id number] DOB: 07-Mar-1946  Returning patient's call from earlier today. HIPAA identifiers verified.  Patient states he is out of Trulicity, due for injection today. Per my prior notes and EMR, Dr. Lucilla Lame is the prescriber for this medication and I faxed patient application to Dr. Joycie Peek office in December 2020, it was submitted by CMA Gordy Councilman on 09/27/19.   Instructed patient to please call Dr. Gabriel Carina regarding his shipment of Trulicity. Patient verbalized understanding.   Follow up plan: The patient will call Dr. Joycie Peek office* as advised to follow up on his Trulicity assistance.   [name], PharmD Clinical Pharmacist [place] [phone]

## 2019-10-18 ENCOUNTER — Ambulatory Visit: Payer: Self-pay | Admitting: Pharmacist

## 2019-10-18 DIAGNOSIS — N183 Chronic kidney disease, stage 3 unspecified: Secondary | ICD-10-CM

## 2019-10-18 DIAGNOSIS — E1122 Type 2 diabetes mellitus with diabetic chronic kidney disease: Secondary | ICD-10-CM

## 2019-10-22 NOTE — Chronic Care Management (AMB) (Signed)
  Chronic Care Management   Care Coordination Note  10/22/2019 Name: Tom Moore MRN: [id number] DOB: [birth year]  Care Coordination: Incoming vm from [place] at Dr. Gabriel Carina of [place], a page is missing from [name] application, could I please check my paperwork to make sure I do not have it.   Goals Addressed            This Visit's Progress   . COMPLETED: 123XX123 Trulicity assistance (pt-stated)       Current Barriers:  . financial  Pharmacist Clinical Goal(s): Over the next 14 days, Tom Moore will provide the necessary supplementary documents (proof of out of pocket prescription expenditure, proof of household income) needed for medication assistance applications to CCM pharmacist.   Interventions: . CCM pharmacist will apply for medication assistance program for Trulicity, Humulin made by [employer] and prescribed by Dr. Lucilla Lame.  Marland Kitchen Updated 1/4: patient returned signed application and 123XX123 Social Security benefits statement; he would like medication delivered to practice . Updated 1/22: vm from [place] at Dr. Gabriel Carina that a page is missing from application, could I please check my files  Patient Self Care Activities:  Marland Kitchen Gather necessary documents needed to apply for medication assistance  Please see past updates related to this goal by clicking on the "Past Updates" button in the selected goal           Follow up plan: Care Coordination follow up appointment with care management team member scheduled for: Monday Jan 25  [name], PharmD Clinical Pharmacist [place] [phone]

## 2019-10-26 ENCOUNTER — Other Ambulatory Visit: Payer: Self-pay | Admitting: Family Medicine

## 2019-11-19 ENCOUNTER — Other Ambulatory Visit: Payer: Self-pay | Admitting: Family Medicine

## 2019-11-20 ENCOUNTER — Telehealth: Payer: Self-pay | Admitting: Urology

## 2019-11-20 ENCOUNTER — Telehealth: Payer: Self-pay | Admitting: Family Medicine

## 2019-11-20 NOTE — Telephone Encounter (Signed)
Pharmacy called stating that there is a duplicate therapy on the pts medications. They are requesting a call back to go over this. Please advise.    [place], [place] - [place]  [address] [address]  Phone: [phone] Fax: [phone]  Not a 24 hour pharmacy; exact hours not known.

## 2019-11-20 NOTE — Telephone Encounter (Signed)
[place] [facility] [employer] [phone]) called the office and left a message on the clinical triage line.  States that Dr. Bernardo Heater sent in a prescription for flomax and oxybutynin.  Dr. Rosanna Randy also sent in prescription for dutasteride (Avodart).  She is requesting clarification as these 3 medication are all for the same symptoms.  Please call to confirm.

## 2019-11-20 NOTE — Telephone Encounter (Signed)
The [facility] is calling DR, Rosanna Randy office to see why he is on 3 medication that may be the same

## 2019-11-21 NOTE — Telephone Encounter (Signed)
Call returned to pharmacy.

## 2019-12-02 ENCOUNTER — Ambulatory Visit: Payer: Medicare Other | Attending: Internal Medicine

## 2019-12-02 DIAGNOSIS — Z23 Encounter for immunization: Secondary | ICD-10-CM

## 2019-12-02 NOTE — Progress Notes (Signed)
   Covid-19 Vaccination Clinic  Name:  Tom Moore    MRN: [id number] DOB: [birth year]  12/02/2019  [name] was observed post Covid-19 immunization for 15 minutes without incident. He was provided with Vaccine Information Sheet and instruction to access the V-Safe system.   [name] was instructed to call 911 with any severe reactions post vaccine: Marland Kitchen Difficulty breathing  . Swelling of face and throat  . A fast heartbeat  . A bad rash all over body  . Dizziness and weakness   Immunizations Administered    Name Date Dose VIS Date Route   Pfizer COVID-19 Vaccine 12/02/2019  9:24 AM 0.3 mL 09/07/2019 Intramuscular   Manufacturer: [place]   Lot: [id number]   [place]: [id number]

## 2019-12-12 ENCOUNTER — Telehealth: Payer: Self-pay | Admitting: Pharmacist

## 2019-12-12 NOTE — Telephone Encounter (Signed)
Patient came by the office stating he has not heard if he had been approved for Trulicity samples through [place] cares.  He is running out and wants to know the update.

## 2019-12-13 ENCOUNTER — Other Ambulatory Visit: Payer: Self-pay

## 2019-12-13 ENCOUNTER — Ambulatory Visit (INDEPENDENT_AMBULATORY_CARE_PROVIDER_SITE_OTHER): Payer: Medicare Other | Admitting: Family Medicine

## 2019-12-13 ENCOUNTER — Encounter: Payer: Self-pay | Admitting: Family Medicine

## 2019-12-13 VITALS — BP 118/80 | HR 73 | Temp 96.8°F | Wt 225.2 lb

## 2019-12-13 DIAGNOSIS — L821 Other seborrheic keratosis: Secondary | ICD-10-CM | POA: Diagnosis not present

## 2019-12-13 DIAGNOSIS — E1122 Type 2 diabetes mellitus with diabetic chronic kidney disease: Secondary | ICD-10-CM | POA: Diagnosis not present

## 2019-12-13 DIAGNOSIS — I480 Paroxysmal atrial fibrillation: Secondary | ICD-10-CM

## 2019-12-13 DIAGNOSIS — N183 Chronic kidney disease, stage 3 unspecified: Secondary | ICD-10-CM

## 2019-12-13 NOTE — Progress Notes (Signed)
Patient: Tom Moore Male    DOB: 08/13/1946   74 y.o.   MRN: [id number] Visit Date: 12/13/2019  Today's Provider: [name], MD   No chief complaint on file.  Subjective:     HPI   Patient presents again today for an area that is around a mole on his left cheek that was frozen off by cryo pen.  This appears to be an irritated seborrheic keratosis/fleshy nevus. He has had his first Covid vaccine. He is followed by endocrinology for his diabetes.  Allergies  Allergen Reactions  . Zyrtec Allergy [Cetirizine] Diarrhea     Current Outpatient Medications:  .  acetaminophen-codeine (TYLENOL #3) 300-30 MG tablet, Take 1 tablet by mouth every 6 (six) hours as needed for moderate pain., Disp: 20 tablet, Rfl: 0 .  apixaban (ELIQUIS) 5 MG TABS tablet, Take 5 mg by mouth daily. , Disp: , Rfl:  .  atorvastatin (LIPITOR) 40 MG tablet, Take 40 mg by mouth daily., Disp: , Rfl:  .  buPROPion (WELLBUTRIN) 75 MG tablet, Take 1 tablet by mouth twice daily, Disp: 60 tablet, Rfl: 0 .  diltiazem (CARDIZEM CD) 120 MG 24 hr capsule, Take 1 capsule (120 mg total) by mouth daily., Disp: 30 capsule, Rfl: 34 .  dutasteride (AVODART) 0.5 MG capsule, Take 1 capsule (0.5 mg total) by mouth daily., Disp: 90 capsule, Rfl: 1 .  furosemide (LASIX) 40 MG tablet, Take 1 tablet (40 mg total) by mouth 2 (two) times daily. (Patient taking differently: Take 40 mg by mouth daily. ), Disp: 180 tablet, Rfl: 1 .  gabapentin (NEURONTIN) 400 MG capsule, Take 400 mg by mouth daily. AT LUNCH, Disp: , Rfl:  .  glipiZIDE (GLUCOTROL) 10 MG tablet, TAKE ONE TABLET BY MOUTH THREE TIMES DAILY BEFORE MEALS (Patient taking differently: 10 mg 2 (two) times daily before a meal. ), Disp: 270 tablet, Rfl: 1 .  glucose blood (ONE TOUCH ULTRA TEST) test strip, Use 3 (three) times daily One touch ultra test strips e11.29, Disp: , Rfl:  .  insulin NPH Human (HUMULIN N,NOVOLIN N) 100 UNIT/ML injection, 28 Units 2 (two) times  daily before a meal. , Disp: , Rfl:  .  lisinopril (ZESTRIL) 40 MG tablet, , Disp: , Rfl:  .  metFORMIN (GLUCOPHAGE) 1000 MG tablet, TAKE 1 TABLET BY MOUTH TWICE DAILY WITH A MEAL (Patient taking differently: daily with breakfast. ), Disp: 180 tablet, Rfl: 0 .  methimazole (TAPAZOLE) 5 MG tablet, Take 5 mg by mouth., Disp: , Rfl:  .  metoprolol succinate (TOPROL-XL) 100 MG 24 hr tablet, Take 100 mg by mouth daily. , Disp: , Rfl: 0 .  multivitamin-iron-minerals-folic acid (THERAPEUTIC-M) TABS tablet, Take 1 tablet by mouth daily., Disp: , Rfl:  .  omeprazole (PRILOSEC) 40 MG capsule, Take 1 capsule by mouth once daily, Disp: 90 capsule, Rfl: 0 .  oxybutynin (DITROPAN) 5 MG tablet, 1 tablet at bedtime, Disp: 30 tablet, Rfl: 11 .  sertraline (ZOLOFT) 100 MG tablet, Take 1 tablet (100 mg total) by mouth daily. (Patient taking differently: Take 50 mg by mouth at bedtime. ), Disp: 90 tablet, Rfl: 1 .  sildenafil (VIAGRA) 100 MG tablet, Take by mouth., Disp: , Rfl:  .  spironolactone (ALDACTONE) 25 MG tablet, Take 25 mg by mouth daily., Disp: , Rfl:  .  TRULICITY 1.5 0000000 SOPN, Inject into the skin once a week., Disp: , Rfl:  .  venlafaxine XR (EFFEXOR-XR) 150 MG 24  hr capsule, TAKE 1 CAPSULE BY MOUTH ONCE DAILY WITH BREAKFAST, Disp: 90 capsule, Rfl: 0  Review of Systems  Constitutional: Negative.   Eyes: Negative.   Respiratory: Negative.   Cardiovascular: Negative.   Gastrointestinal: Negative.   Skin:       Irritated SK/fleshy nevus.  Allergic/Immunologic: Negative.   Neurological: Negative.   Hematological: Negative.   Psychiatric/Behavioral: Negative.     Social History   Tobacco Use  . Smoking status: Former Smoker    Quit date: 04/03/1988    Years since quitting: 31.7  . Smokeless tobacco: Never Used  Substance Use Topics  . Alcohol use: Not Currently    Comment: Ocassional alcohol use, Drinks wine.      Objective:   There were no vitals taken for this visit. There were  no vitals filed for this visit.There is no height or weight on file to calculate BMI.   Physical Exam Vitals reviewed.  Constitutional:      Appearance: He is obese.  HENT:     Head: Normocephalic and atraumatic.     Right Ear: External ear normal.     Left Ear: External ear normal.  Eyes:     General: No scleral icterus. Cardiovascular:     Rate and Rhythm: Normal rate and regular rhythm.     Heart sounds: Normal heart sounds.  Pulmonary:     Effort: Pulmonary effort is normal.     Breath sounds: Normal breath sounds.  Skin:    General: Skin is warm and dry.  Neurological:     Mental Status: He is alert and oriented to person, place, and time.  Psychiatric:        Mood and Affect: Mood normal.        Behavior: Behavior normal.        Thought Content: Thought content normal.        Judgment: Judgment normal.      No results found for any visits on 12/13/19.     Assessment & Plan    1. Paroxysmal atrial fibrillation (HCC)   2. CKD stage 3 due to type 2 diabetes mellitus ([place]) F/u labs.  3. Seborrheic keratosis Cryo pen not available today.  We will do in the future or refer to dermatology.     Erico Stan Cranford Mon, MD  [facility]

## 2019-12-15 ENCOUNTER — Ambulatory Visit: Payer: PPO | Admitting: Family Medicine

## 2019-12-21 ENCOUNTER — Ambulatory Visit: Payer: Self-pay | Admitting: *Deleted

## 2019-12-21 DIAGNOSIS — E1122 Type 2 diabetes mellitus with diabetic chronic kidney disease: Secondary | ICD-10-CM

## 2019-12-21 NOTE — Chronic Care Management (AMB) (Signed)
  Chronic Care Management   Note  12/21/2019 Name: Tom Moore MRN: [id number] DOB: October 20, 1945  Patient call/request for medication assistance updated noted. [place] notified that patient is in need of assistance and is running out of medication.   Follow up plan: [place] follow up.    [facility] Care Management [phone]

## 2019-12-24 ENCOUNTER — Other Ambulatory Visit: Payer: Self-pay | Admitting: Pharmacist

## 2019-12-24 NOTE — Telephone Encounter (Signed)
[facility] Pharmacist is trying to work on this for pt and needs additional information from the office.

## 2019-12-24 NOTE — Patient Outreach (Addendum)
[facility]) Care Management  12/24/2019  Tom Moore 1945/12/16 [id number]  Received referral stating patient was running out of Trulicity and wanted to know the status of his [employer] application.  [employer] was called. The representative said the patient's portion (application and financial documents) had not been received. She said they had the provider's portion but not the patient's.  The representative recommended that a new application be completed for the patient.   Called patient. HIPAA identifiers were obtained.  Patient communicated understanding.  He said he sent in his financial information.  He requested that the provider portion be sent to Dr. Alben Spittle office.   It appears a provider from [name] gave the patient Ozempic on 12/11/2019.  Humulin N was on the [employer] not Trulicity.  Called patient back to discuss x 3 but he did not answer the phone. HIPAA compliant message was left on his voicemail.   Plan: I will route patient assistance letter to [place] technician who will coordinate patient assistance program application process for medications listed above.  [facility] pharmacy technician will assist with obtaining all required documents from both patient and provider(s) and submit application(s) once completed.   [name] will follow during the patient assistance process.  Elayne Guerin, PharmD, [place] Clinical Pharmacist [phone]  ADDENDUM  Spoke with Claretha Cooper, RN. She said she would speak with Dr. Rosanna Randy about signing the forms.  Called patient back again to discuss Humulin but he did not answer the phone. HIPAA compliant message left on his voicemail.  Patient will follow up with [name], CPhT for medication assistance follow up  Elayne Guerin, PharmD, [place] Clinical Pharmacist [phone]

## 2019-12-25 ENCOUNTER — Other Ambulatory Visit: Payer: Self-pay | Admitting: Pharmacy Technician

## 2019-12-25 NOTE — Telephone Encounter (Signed)
Received fax today. Will fill out and fax back.

## 2019-12-25 NOTE — Patient Outreach (Signed)
[facility]) Care Management  12/25/2019  Tom Moore [birth year] [id number]                                        Medication Assistance Referral  Referral From: [place]  Medication/Company: Danelle Berry / [employer] Patient application portion:  Mailed Provider application portion: Faxed  to Dr. Miguel Aschoff Provider address/fax verified via: Office website     Follow up:  Will follow up with patient in 10-14 business days to confirm application(s) have been received.  [name] P. [name], [name]  [phone]

## 2019-12-26 ENCOUNTER — Ambulatory Visit: Payer: Medicare Other | Attending: Internal Medicine

## 2019-12-26 DIAGNOSIS — Z23 Encounter for immunization: Secondary | ICD-10-CM

## 2019-12-26 NOTE — Telephone Encounter (Signed)
Form was faxed 

## 2019-12-26 NOTE — Progress Notes (Signed)
   Covid-19 Vaccination Clinic  Name:  Tom Moore    MRN: [id number] DOB: 14-Feb-1946  12/26/2019  [name] was observed post Covid-19 immunization for 15 minutes without incident. He was provided with Vaccine Information Sheet and instruction to access the V-Safe system.   [name] was instructed to call 911 with any severe reactions post vaccine: Marland Kitchen Difficulty breathing  . Swelling of face and throat  . A fast heartbeat  . A bad rash all over body  . Dizziness and weakness   Immunizations Administered    Name Date Dose VIS Date Route   Pfizer COVID-19 Vaccine 12/26/2019 11:56 AM 0.3 mL 09/07/2019 Intramuscular   Manufacturer: [place]   Lot: [id number]   [place]af[place] Capo: [id number]

## 2019-12-27 ENCOUNTER — Ambulatory Visit: Payer: Self-pay | Admitting: Pharmacist

## 2020-01-03 ENCOUNTER — Ambulatory Visit: Payer: Self-pay | Admitting: Family Medicine

## 2020-01-03 ENCOUNTER — Other Ambulatory Visit: Payer: Self-pay | Admitting: Pharmacy Technician

## 2020-01-03 NOTE — Patient Outreach (Signed)
[place] [facility]) Care Management  01/03/2020  Tom Moore [birth year] [id number]   Successful call placed to patient regarding patient assistance application(s) for Trulicity and Humulin N kwikpen with [employer] , HIPAA identifiers verified.   Patient informs he received the application and mailed it back along with his social security statement in the envelope provided to [place], [place]. He informs he did this the day before yesterday. Informed patient I would be looking for it in the mail as I had not receive it as of yesterday. Patient verbalized understanding.  Follow up:  Will route note to [place] for case closure if document(s) have not been received in the next 15 business days.  Aalayah Riles P. Harjit Leider, [place]  [phone]

## 2020-01-04 ENCOUNTER — Other Ambulatory Visit: Payer: Self-pay | Admitting: Family Medicine

## 2020-01-09 ENCOUNTER — Other Ambulatory Visit: Payer: Self-pay | Admitting: Pharmacy Technician

## 2020-01-09 NOTE — Patient Outreach (Signed)
[facility]) Care Management  01/09/2020  EVIE WILTSIE 1945/10/02 [id number]   Received both patient and provider portion(s) of patient assistance application(s) for Humulin N and Trulicity. Faxed completed application and required documents into [employer].  Will follow up with company(ies) in 7-10 business days to check status of application(s).  Karalynn Cottone P. Giavanni Odonovan, [place]  [phone]

## 2020-01-18 ENCOUNTER — Other Ambulatory Visit: Payer: Self-pay | Admitting: Pharmacy Technician

## 2020-01-18 NOTE — Patient Outreach (Signed)
[facility]) Care Management  01/18/2020  SUHAAN JAQUES 1946-02-17 [id number]  Care coordination cal placed to [place] in regards to patient's application for Humulin N kwikpen and Trulicity.  Spoke to [place] who informed patient was APPROVED 01/10/2020-09/26/2020. She informed the request was made to the pharmacy on 01/10/2020, medication was shipped on 01/15/2020 and delivered on 01/16/2020.  Will outreach patient with this information, confirm medication was received and discuss refill procedure.  Tamee Battin P. Eduarda Scrivens, [place]  [phone]

## 2020-01-18 NOTE — Patient Outreach (Signed)
[place] [facility]) Care Management  01/18/2020  REED DIDOMENICO 09-17-1946 [id number]   ADDENDUM   Successful call placed to patient regarding patient assistance medication delivery of Humulin N kwikpen and Trulicity with [employer] patient assistance program, HIPAA identifiers verified.   Patient confirmed that he received both medications. Discussed refill procedure with patient which will require the patient to call [employer] patient assistance company to request his refills. Provided patient the phone number to [employer] patient assistance company. Patient verbalized understanding.  Confirmed patient had name and number as he had no other questions or concerns.  Follow up:  WIll close case as patient assistance is completed and will remove myself from care team.  Luiz Ochoa. Kacey Vicuna, [place]  [phone]

## 2020-01-21 NOTE — Progress Notes (Deleted)
Complete physical exam   Patient: Tom Moore   DOB: [birth year]   74 y.o. Male  MRN: [id number] Visit Date: 01/21/2020  Today's healthcare provider: [name], MD   No chief complaint on file.  Subjective    Tom Moore is a 74 y.o. male who presents today for a complete physical exam.  He reports consuming a {diet types:17450} diet. {Exercise:19826} He generally feels {well/fairly well/poorly:18703}. He reports sleeping {well/fairly well/poorly:18703}. He {does/does not:200015} have additional problems to discuss today.  HPI  ***  Past Medical History:  Diagnosis Date  . Anxiety   . Arthritis   . CHF (congestive heart failure) ([place])   . Depression   . Diabetes mellitus without complication ([place])   . Dysrhythmia    atrial fib  . GERD (gastroesophageal reflux disease)   . Hyperlipidemia   . Hypertension   . Hyperthyroidism   . Insomnia   . Neuromuscular disorder ([place])    diabetic neuropathy  . Orthopnea    uses extra pillow to sleep  . Sleep apnea    unable to tolerater CPAP   Past Surgical History:  Procedure Laterality Date  . CARDIAC CATHETERIZATION    . CARPAL TUNNEL RELEASE Right 04/10/2018   Procedure: RELEASE MEDIAN NERVE AT CARPAL TUNNEL;  Surgeon: Earnestine Leys, MD;  Location: [facility] ORS;  Service: Orthopedics;  Laterality: Right;  . CARPAL TUNNEL RELEASE Left 05/08/2018   Procedure: CARPAL TUNNEL RELEASE;  Surgeon: Earnestine Leys, MD;  Location: [facility] ORS;  Service: Orthopedics;  Laterality: Left;  . CATARACT EXTRACTION W/PHACO Right 08/16/2019   Procedure: CATARACT EXTRACTION PHACO AND INTRAOCULAR LENS PLACEMENT ([place]) RIGHT;  Surgeon: Marchia Meiers, MD;  Location: [facility] ORS;  Service: Ophthalmology;  Laterality: Right;  [place] 01:30.3 CDE 13.71 Fluid Pack lot # [id number]  . TONSILLECTOMY     Social History   Socioeconomic History  . Marital status: Divorced    Spouse name: Not on file  . Number of children: Not on file  . Years of  education: Not on file  . Highest education level: Not on file  Occupational History  . Not on file  Tobacco Use  . Smoking status: Former Smoker    Quit date: 04/03/1988    Years since quitting: 31.8  . Smokeless tobacco: Never Used  Substance and Sexual Activity  . Alcohol use: Not Currently    Comment: Ocassional alcohol use, Drinks wine.  . Drug use: Never  . Sexual activity: Not Currently  Other Topics Concern  . Not on file  Social History Narrative  . Not on file   Social Determinants of Health   Financial Resource Strain:   . Difficulty of Paying Living Expenses:   Food Insecurity:   . Worried About Charity fundraiser in the Last Year:   . Arboriculturist in the Last Year:   Transportation Needs:   . Film/video editor (Medical):   Marland Kitchen Lack of Transportation (Non-Medical):   Physical Activity: Sufficiently Active  . Days of Exercise per Week: 7 days  . Minutes of Exercise per Session: 40 min  Stress: No Stress Concern Present  . Feeling of Stress : Not at all  Social Connections: Somewhat Isolated  . Frequency of Communication with Friends and Family: More than three times a week  . Frequency of Social Gatherings with Friends and Family: More than three times a week  . Attends Religious Services: 1 to 4 times per year  .  Active Member of Clubs or Organizations: No  . Attends Archivist Meetings: Never  . Marital Status: Widowed  Intimate Partner Violence: Not At Risk  . Fear of Current or Ex-Partner: No  . Emotionally Abused: No  . Physically Abused: No  . Sexually Abused: No   Family Status  Relation Name Status  . Mother  (Not Specified)       History of stroke, Kidney Failure  . Father  (Not Specified)       History of stroke, heart disease, alcoholism   Family History  Problem Relation Age of Onset  . Hypertension Mother   . Heart disease Mother    Allergies  Allergen Reactions  . Zyrtec Allergy [Cetirizine] Diarrhea    Patient  Care Team: Jerrol Banana., MD as PCP - General (Family Medicine) [place], [place], [place] as Social Worker   Medications: Outpatient Medications Prior to Visit  Medication Sig  . acetaminophen-codeine (TYLENOL #3) 300-30 MG tablet Take 1 tablet by mouth every 6 (six) hours as needed for moderate pain.  Marland Kitchen apixaban (ELIQUIS) 5 MG TABS tablet Take 5 mg by mouth daily.   Marland Kitchen atorvastatin (LIPITOR) 40 MG tablet Take 40 mg by mouth daily.  Marland Kitchen buPROPion (WELLBUTRIN) 75 MG tablet Take 1 tablet by mouth twice daily  . diltiazem (CARDIZEM CD) 120 MG 24 hr capsule Take 1 capsule (120 mg total) by mouth daily.  Marland Kitchen dutasteride (AVODART) 0.5 MG capsule Take 1 capsule (0.5 mg total) by mouth daily.  . furosemide (LASIX) 40 MG tablet Take 1 tablet (40 mg total) by mouth 2 (two) times daily. (Patient taking differently: Take 40 mg by mouth daily. )  . gabapentin (NEURONTIN) 400 MG capsule Take 400 mg by mouth daily. AT LUNCH  . glipiZIDE (GLUCOTROL) 10 MG tablet TAKE ONE TABLET BY MOUTH THREE TIMES DAILY BEFORE MEALS (Patient taking differently: 10 mg 2 (two) times daily before a meal. )  . glucose blood (ONE TOUCH ULTRA TEST) test strip Use 3 (three) times daily One touch ultra test strips e11.29  . insulin NPH Human (HUMULIN N,NOVOLIN N) 100 UNIT/ML injection 28 Units 2 (two) times daily before a meal.   . lisinopril (ZESTRIL) 40 MG tablet   . metFORMIN (GLUCOPHAGE) 1000 MG tablet TAKE 1 TABLET BY MOUTH TWICE DAILY WITH A MEAL (Patient taking differently: daily with breakfast. )  . methimazole (TAPAZOLE) 5 MG tablet Take 5 mg by mouth.  . metoprolol succinate (TOPROL-XL) 100 MG 24 hr tablet Take 100 mg by mouth daily.   . multivitamin-iron-minerals-folic acid (THERAPEUTIC-M) TABS tablet Take 1 tablet by mouth daily.  Marland Kitchen omeprazole (PRILOSEC) 40 MG capsule Take 1 capsule by mouth once daily  . oxybutynin (DITROPAN) 5 MG tablet 1 tablet at bedtime  . sertraline (ZOLOFT) 100 MG tablet Take 1 tablet (100 mg  total) by mouth daily. (Patient taking differently: Take 50 mg by mouth at bedtime. )  . sildenafil (VIAGRA) 100 MG tablet Take by mouth.  . spironolactone (ALDACTONE) 25 MG tablet Take 25 mg by mouth daily.  . TRULICITY 1.5 0000000 SOPN Inject into the skin once a week.  . venlafaxine XR (EFFEXOR-XR) 150 MG 24 hr capsule TAKE 1 CAPSULE BY MOUTH ONCE DAILY WITH BREAKFAST   No facility-administered medications prior to visit.    Review of Systems  {Show previous labs (optional):23779::" "}  Objective    There were no vitals taken for this visit. {Show previous vital signs (optional):23777::" "}  Physical  Exam  ***  Depression Screen  PHQ 2/9 Scores 07/05/2019 02/09/2019 01/31/2019  PHQ - 2 Score 4 1 3   PHQ- 9 Score 17 - 11    No results found for any visits on 01/24/20.  Assessment & Plan    Routine Health Maintenance and Physical Exam  Exercise Activities and Dietary recommendations Goals    . " I would like to feel better (pt-stated)     Current Barriers:  . Financial constraints . Mental Health Concerns   Clinical Social Work Clinical Goal(s):  Marland Kitchen Over the next 30  days, client will work with SW to address concerns related to his symptoms of depression  Interventions: . Patient interviewed and appropriate assessments performed . Continued to provide mental health counseling and emotional support with regard to patient's symptoms of depression  . Patient continues to report no side affects with the current anti-depressant prescribed and feels that the medication has been effective . Continued to emphasized self care and utilizing healthy coping strategies for depression . Discussed plans with patient for ongoing care management follow up and provided patient with direct contact information for care management team . Patient continues to deny need for outpatient therapy referral at this time   Patient Self Care Activities:  . Self administers medications as  prescribed . Attends all scheduled provider appointments . Performs ADL's independently . Performs IADL's independently  Please see past updates related to this goal by clicking on the "Past Updates" button in the selected goal         Immunization History  Administered Date(s) Administered  . PFIZER SARS-COV-2 Vaccination 12/02/2019, 12/26/2019    Health Maintenance  Topic Date Due  . Hepatitis C Screening  Never done  . FOOT EXAM  Never done  . TETANUS/TDAP  Never done  . PNA vac Low Risk Adult (1 of 2 - PCV13) Never done  . HEMOGLOBIN A1C  12/19/2018  . INFLUENZA VACCINE  04/27/2020  . OPHTHALMOLOGY EXAM  05/28/2020  . COLONOSCOPY  03/04/2021  . COVID-19 Vaccine  Completed    Discussed health benefits of physical activity, and encouraged him to engage in regular exercise appropriate for his age and condition.  ***  No follow-ups on file.     {provider attestation***:1}   [name], MD  [facility] [phone] (phone) [phone] (fax)  [place]

## 2020-01-24 ENCOUNTER — Ambulatory Visit (INDEPENDENT_AMBULATORY_CARE_PROVIDER_SITE_OTHER): Payer: Medicare Other | Admitting: Family Medicine

## 2020-01-24 ENCOUNTER — Other Ambulatory Visit: Payer: Self-pay

## 2020-01-24 ENCOUNTER — Ambulatory Visit
Admission: RE | Admit: 2020-01-24 | Discharge: 2020-01-24 | Disposition: A | Discharge status: Home / Self Care | Payer: Medicare Other | Source: Ambulatory Visit | Attending: Family Medicine | Admitting: Family Medicine

## 2020-01-24 ENCOUNTER — Ambulatory Visit
Admission: RE | Admit: 2020-01-24 | Discharge: 2020-01-24 | Disposition: A | Discharge status: Home / Self Care | Payer: Medicare Other | Attending: Family Medicine | Admitting: Family Medicine

## 2020-01-24 ENCOUNTER — Encounter: Payer: Self-pay | Admitting: Family Medicine

## 2020-01-24 VITALS — BP 122/81 | HR 75 | Temp 96.8°F | Resp 16 | Ht 69.0 in | Wt 224.0 lb

## 2020-01-24 DIAGNOSIS — E1122 Type 2 diabetes mellitus with diabetic chronic kidney disease: Secondary | ICD-10-CM | POA: Diagnosis not present

## 2020-01-24 DIAGNOSIS — I10 Essential (primary) hypertension: Secondary | ICD-10-CM | POA: Diagnosis not present

## 2020-01-24 DIAGNOSIS — N183 Chronic kidney disease, stage 3 unspecified: Secondary | ICD-10-CM

## 2020-01-24 DIAGNOSIS — I480 Paroxysmal atrial fibrillation: Secondary | ICD-10-CM

## 2020-01-24 DIAGNOSIS — R1031 Right lower quadrant pain: Secondary | ICD-10-CM | POA: Insufficient documentation

## 2020-01-24 IMAGING — CR DG HIP (WITH OR WITHOUT PELVIS) 2-3V*R*
1 series · 3 of 3 positions shown · non-contrast
Comparison: None.

CLINICAL DATA: Right hip pain for 1 week. No known injury.

EXAM:
DG HIP (WITH OR WITHOUT PELVIS) 2-3V RIGHT

[Series 1: dg hip unilat w or w/o pelvis 2-3 views  · non-contrast · 0.14mm/px · 3 of 3 slices shown]
[im 1/3]
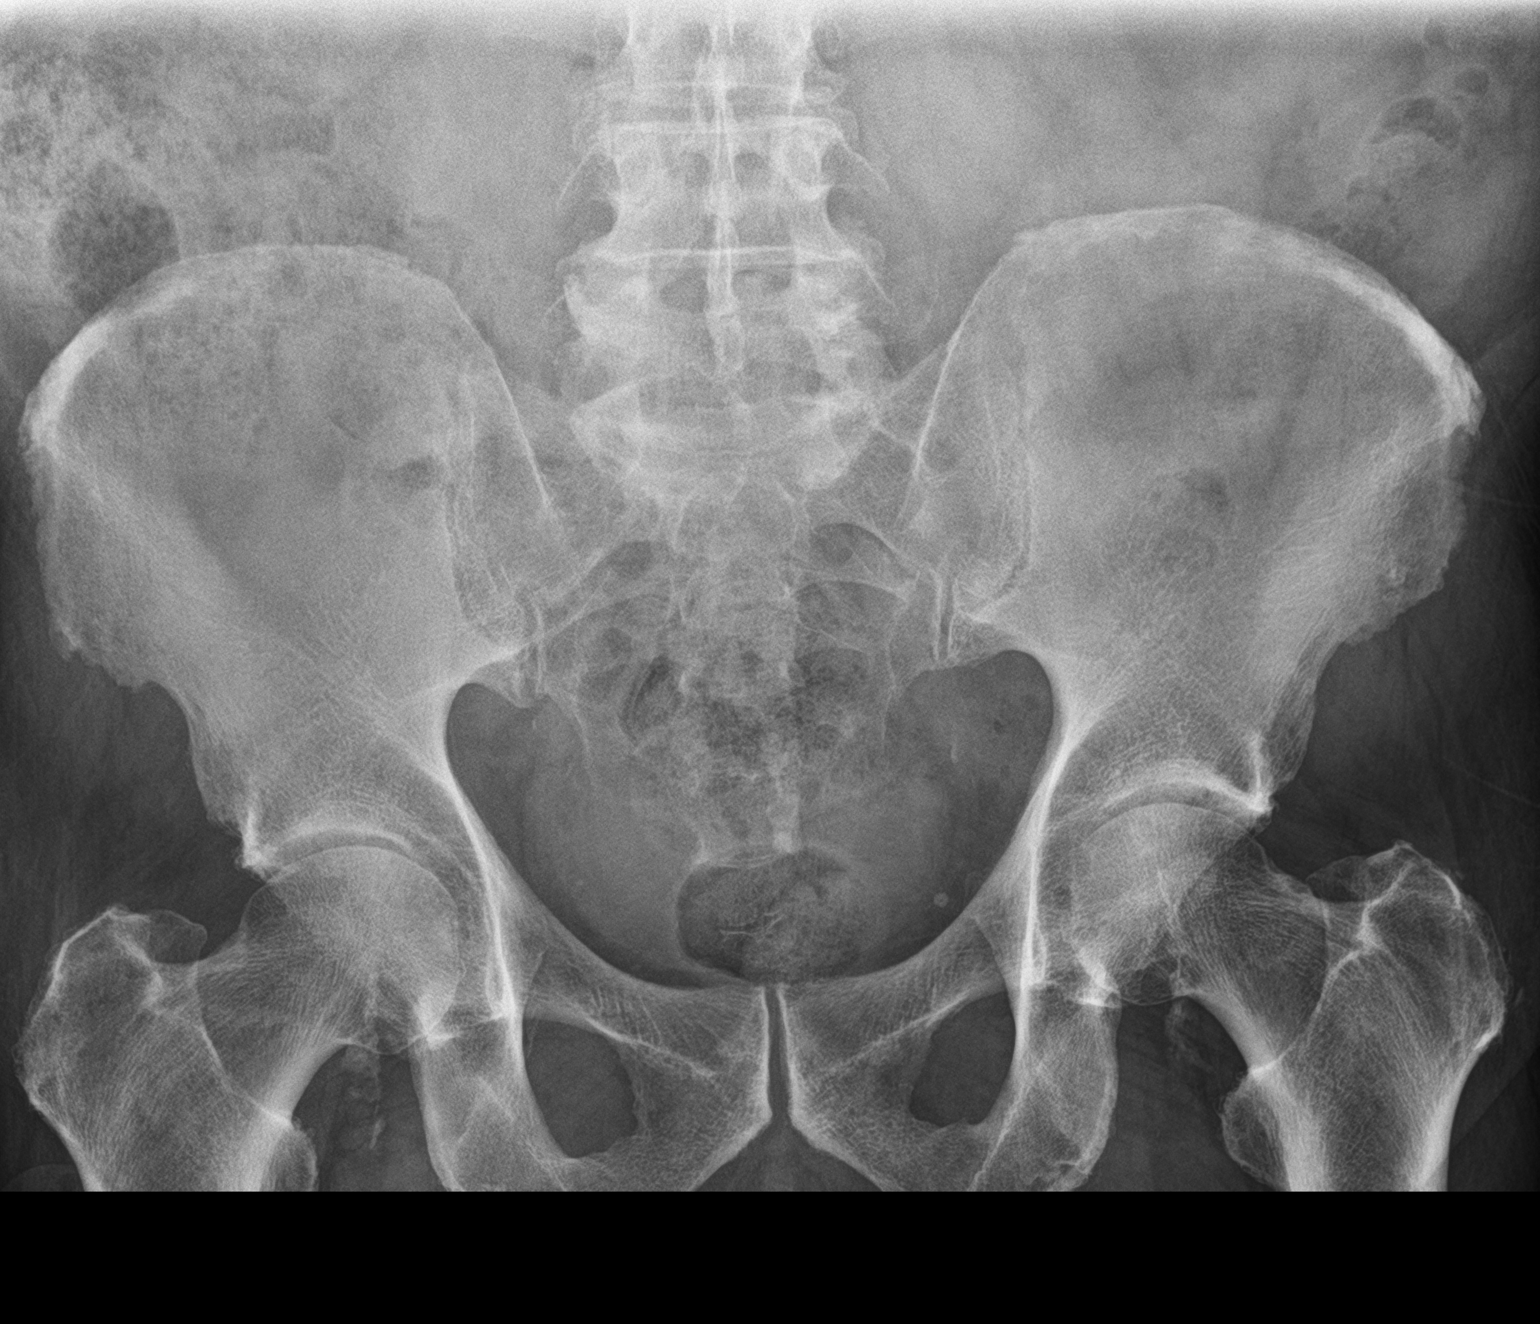
[im 2/3]
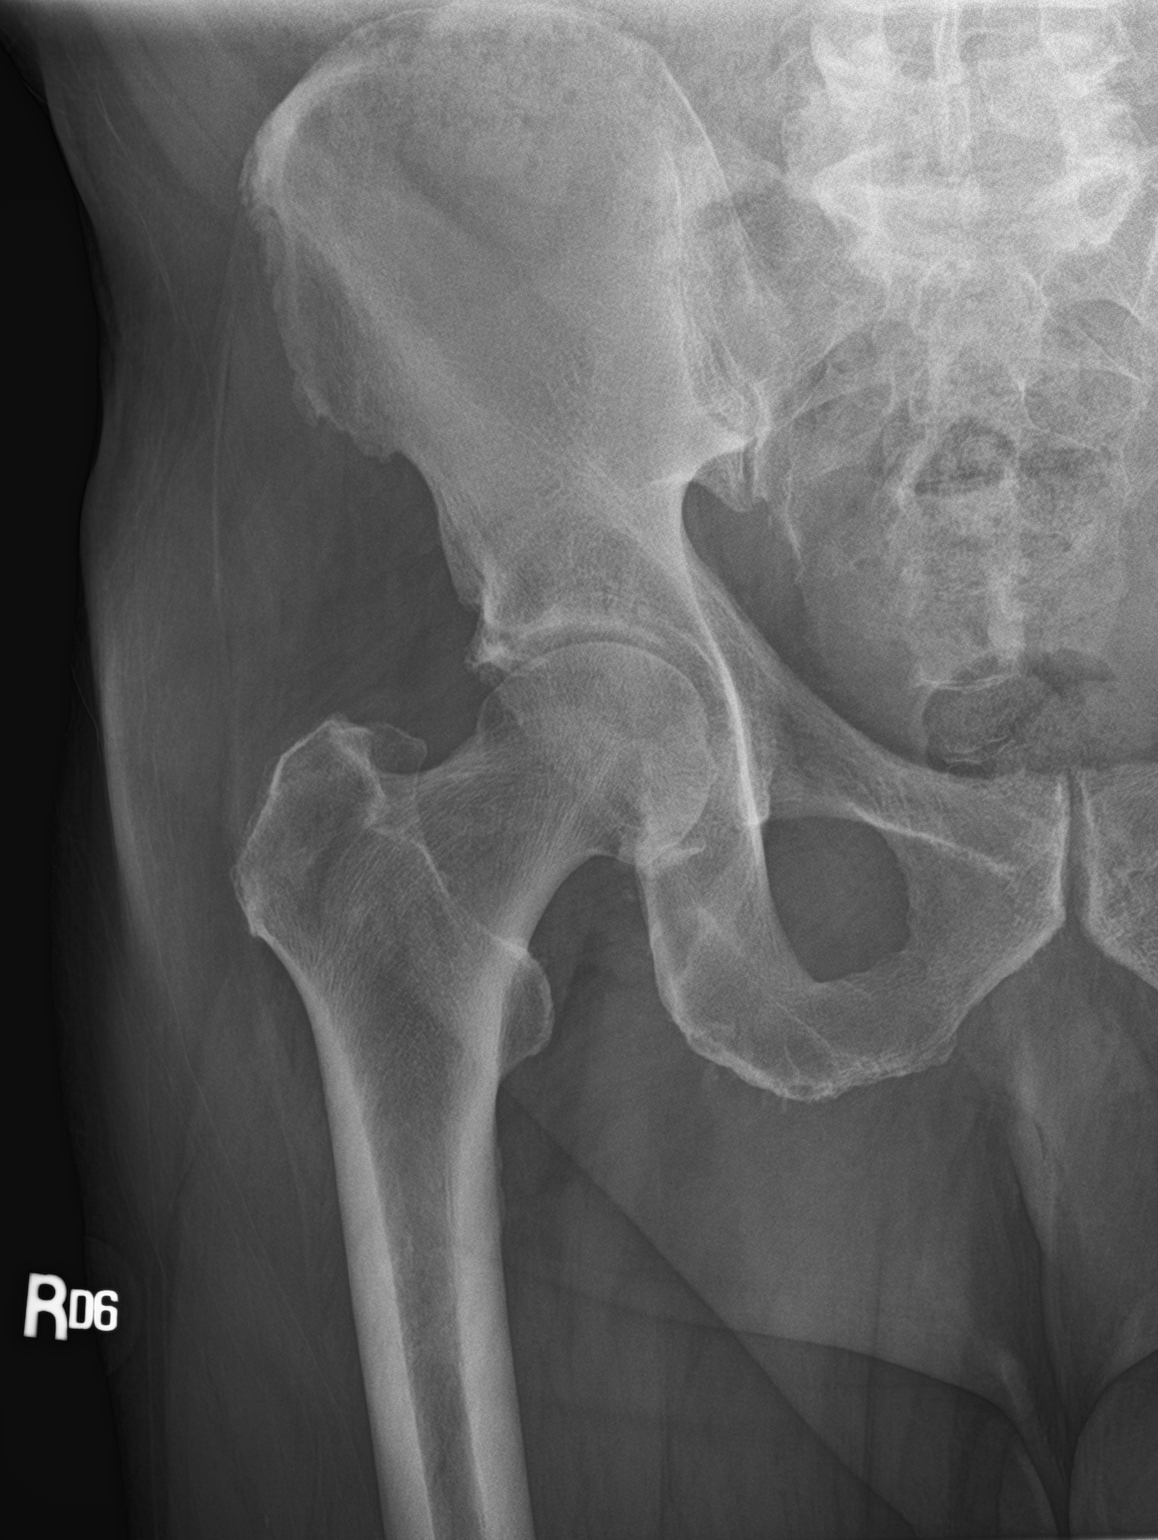
[im 3/3]
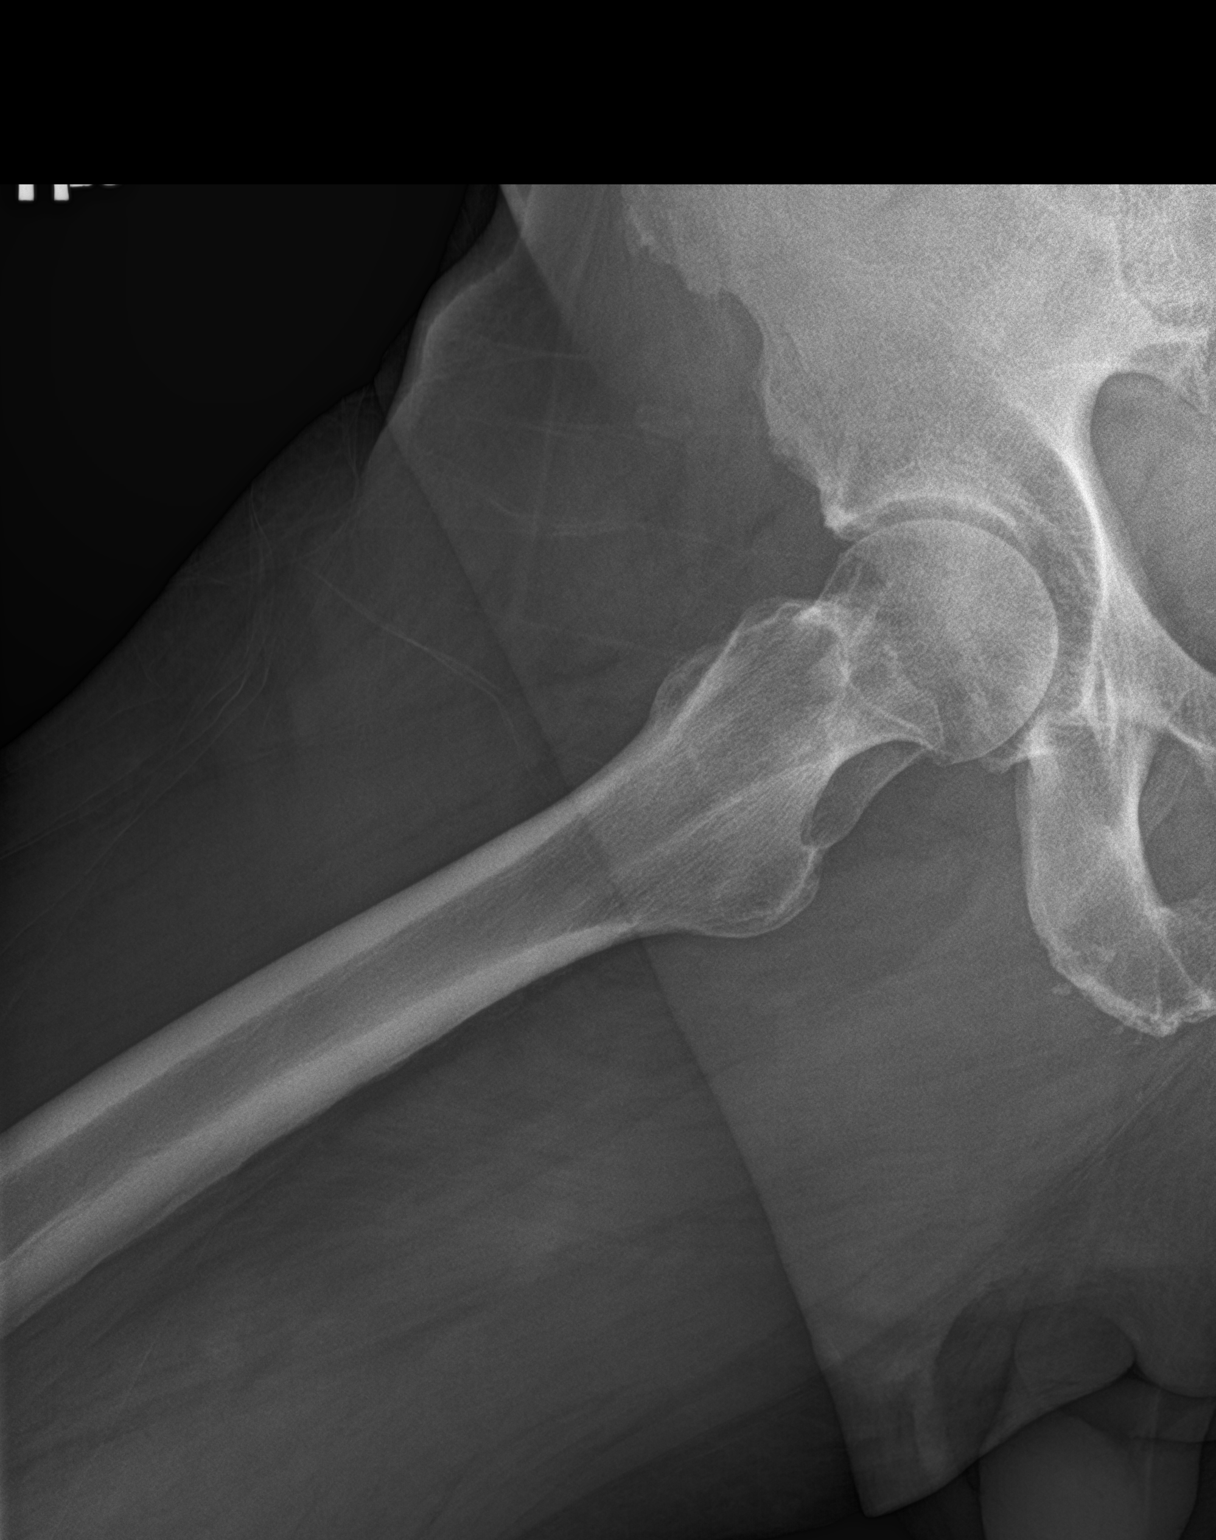

[3 of 3 positions shown; findings below may reference images not displayed]

FINDINGS: There is no evidence of hip fracture or dislocation. Mild
degenerative spurring of right hip joint is seen, without
significant joint space narrowing. No other osseous abnormality
identified. Right common femoral artery atherosclerotic
calcification noted.
IMPRESSION: No acute findings.  Mild right hip osteoarthritis.

## 2020-01-24 MED ORDER — NAPROXEN 500 MG PO TABS: 500.0000 mg | ORAL_TABLET | Freq: Two times a day (BID) | ORAL | 0 refills | Status: DC

## 2020-01-24 NOTE — Progress Notes (Signed)
Established patient visit  I,Tom Moore,acting as a scribe for Tom Durie, MD.,have documented all relevant documentation on the behalf of Tom Durie, MD,as directed by  Tom Durie, MD while in the presence of Tom Durie, MD.   Patient: Tom Moore   DOB: 1946/01/11   74 y.o. Male  MRN: [id number] Visit Date: 01/24/2020  Today's healthcare provider: [name], MD   Chief Complaint  Patient presents with  . Leg Pain  . Groin Pain   Subjective    Leg Pain  The incident occurred 5 to 7 days ago (5 days ago). There was no injury mechanism. The pain is present in the right leg. The quality of the pain is described as stabbing. The pain is at a severity of 7/10. The pain is moderate. The pain has been fluctuating since onset. Associated symptoms include an inability to bear weight. Pertinent negatives include no loss of motion, loss of sensation, muscle weakness, numbness or tingling. He reports no foreign bodies present. The symptoms are aggravated by movement and weight bearing. He has tried acetaminophen for the symptoms. The treatment provided no relief.   Patient states he has had right inner thigh and groin pain for 5 days. Patient states pain started in his inner thigh 5 days ago and moved up into the groin area. No injury mechanism. Patient states pain fluctuates. Patient has been treating symptoms with Tylenol with no relief.  It definitely gets what worse with weightbearing and trying to walk on his right leg      Medications: Outpatient Medications Prior to Visit  Medication Sig  . acetaminophen-codeine (TYLENOL #3) 300-30 MG tablet Take 1 tablet by mouth every 6 (six) hours as needed for moderate pain.  Marland Kitchen apixaban (ELIQUIS) 5 MG TABS tablet Take 5 mg by mouth daily.   Marland Kitchen atorvastatin (LIPITOR) 40 MG tablet Take 40 mg by mouth daily.  Marland Kitchen buPROPion (WELLBUTRIN) 75 MG tablet Take 1 tablet by mouth twice daily  . diltiazem (CARDIZEM  CD) 120 MG 24 hr capsule Take 1 capsule (120 mg total) by mouth daily.  . furosemide (LASIX) 40 MG tablet Take 1 tablet (40 mg total) by mouth 2 (two) times daily. (Patient taking differently: Take 40 mg by mouth daily. )  . gabapentin (NEURONTIN) 400 MG capsule Take 400 mg by mouth daily. AT LUNCH  . glipiZIDE (GLUCOTROL) 10 MG tablet TAKE ONE TABLET BY MOUTH THREE TIMES DAILY BEFORE MEALS (Patient taking differently: 10 mg 2 (two) times daily before a meal. )  . glucose blood (ONE TOUCH ULTRA TEST) test strip Use 3 (three) times daily One touch ultra test strips e11.29  . insulin NPH Human (HUMULIN N,NOVOLIN N) 100 UNIT/ML injection 28 Units 2 (two) times daily before a meal.   . metFORMIN (GLUCOPHAGE) 1000 MG tablet TAKE 1 TABLET BY MOUTH TWICE DAILY WITH A MEAL (Patient taking differently: daily with breakfast. )  . methimazole (TAPAZOLE) 5 MG tablet Take 5 mg by mouth.  . metoprolol succinate (TOPROL-XL) 100 MG 24 hr tablet Take 100 mg by mouth daily.   . multivitamin-iron-minerals-folic acid (THERAPEUTIC-M) TABS tablet Take 1 tablet by mouth daily.  Marland Kitchen omeprazole (PRILOSEC) 40 MG capsule Take 1 capsule by mouth once daily  . oxybutynin (DITROPAN) 5 MG tablet 1 tablet at bedtime  . sertraline (ZOLOFT) 100 MG tablet Take 1 tablet (100 mg total) by mouth daily. (Patient taking differently: Take 50 mg by mouth at bedtime. )  .  sildenafil (VIAGRA) 100 MG tablet Take by mouth.  . spironolactone (ALDACTONE) 25 MG tablet Take 25 mg by mouth daily.  . TRULICITY 1.5 0000000 SOPN Inject into the skin once a week.  . venlafaxine XR (EFFEXOR-XR) 150 MG 24 hr capsule TAKE 1 CAPSULE BY MOUTH ONCE DAILY WITH BREAKFAST  . dutasteride (AVODART) 0.5 MG capsule Take 1 capsule (0.5 mg total) by mouth daily.  Marland Kitchen lisinopril (ZESTRIL) 40 MG tablet    No facility-administered medications prior to visit.    Review of Systems  Constitutional: Negative.   Eyes: Negative.   Respiratory: Negative.     Cardiovascular: Negative.   Gastrointestinal: Negative.   Endocrine: Negative.   Musculoskeletal: Positive for arthralgias and gait problem.  Skin:       Irritated SK/fleshy nevus.  Allergic/Immunologic: Negative.   Neurological: Negative for tingling and numbness.  Hematological: Negative.   Psychiatric/Behavioral: Negative.     Last hemoglobin A1c Lab Results  Component Value Date   HGBA1C 7.4 (A) 06/20/2018      Objective    BP 122/81 (BP Location: Right Arm, Patient Position: Sitting, Cuff Size: Large)   Pulse 75   Temp (!) 96.8 F (36 C) (Other (Comment))   Resp 16   Ht 5\' 9"  (1.753 m)   Wt 224 lb (101.6 kg)   SpO2 96%   BMI 33.08 kg/m  BP Readings from Last 3 Encounters:  01/24/20 122/81  12/13/19 118/80  09/10/19 137/82   Wt Readings from Last 3 Encounters:  01/24/20 224 lb (101.6 kg)  12/13/19 225 lb 3.2 oz (102.2 kg)  09/10/19 227 lb (103 kg)      Physical Exam Vitals and nursing note reviewed.  Constitutional:      Appearance: Normal appearance. He is normal weight.  Eyes:     Conjunctiva/sclera: Conjunctivae normal.  Cardiovascular:     Rate and Rhythm: Normal rate and regular rhythm.     Pulses: Normal pulses.     Heart sounds: Normal heart sounds.  Pulmonary:     Effort: Pulmonary effort is normal.     Breath sounds: Normal breath sounds.  Genitourinary:    Penis: Normal.      Testes: Normal.     Comments: No hernia and no adenopathy in the groin region Musculoskeletal:        General: Normal range of motion.     Cervical back: Normal range of motion and neck supple.     Right lower leg: No edema.     Left lower leg: No edema.     Comments: Figure 4 maneuver positive on right   Neurological:     Mental Status: He is alert.  Psychiatric:        Mood and Affect: Mood normal.        Behavior: Behavior normal.        Thought Content: Thought content normal.        Judgment: Judgment normal.       No results found for any visits on  01/24/20.  Assessment & Plan     1. Right groin pain Most likely hip OA at this time.  Carefully use Naprosyn and patient on Eliquis. - DG HIP UNILAT WITH PELVIS MIN 4 VIEWS RIGHT - naproxen (NAPROSYN) 500 MG tablet; Take 1 tablet (500 mg total) by mouth 2 (two) times daily with a meal.  Dispense: 60 tablet; Refill: 0 - DG Hip Unilat W OR W/O Pelvis 2-3 Views Right  2. Paroxysmal atrial fibrillation (  [place]) Eliquis  3. Essential hypertension   4. CKD stage 3 due to type 2 diabetes mellitus (HCC) On lisinopril 40 and diabetes followed by endocrinology   No follow-ups on file.      I, Tom Durie, MD, have reviewed all documentation for this visit. The documentation on 01/27/20 for the exam, diagnosis, procedures, and orders are all accurate and complete.    Patty Leitzke Cranford Mon, MD  [facility] [phone] (phone) [phone] (fax)  [place]

## 2020-01-31 ENCOUNTER — Other Ambulatory Visit: Payer: Self-pay | Admitting: Family Medicine

## 2020-01-31 DIAGNOSIS — F329 Major depressive disorder, single episode, unspecified: Secondary | ICD-10-CM

## 2020-01-31 DIAGNOSIS — F32A Depression, unspecified: Secondary | ICD-10-CM

## 2020-02-08 NOTE — Progress Notes (Signed)
Established patient visit  I,[name],acting as a scribe for [name], MD.,have documented all relevant documentation on the behalf of [name], MD,as directed by  [name], MD while in the presence of [name], MD.   Patient: Tom Moore   DOB: 12-21-1945   74 y.o. Male  MRN: [id number] Visit Date: 02/12/2020  Today's healthcare provider: [name], MD   Chief Complaint  Patient presents with  . Follow-up  . Hypertension   Subjective    HPI Groin pain from last visit has completely resolved.  Patient's complaint today is 1 of nausea and decreased appetite.  The nausea occurs after eating for the past 2 weeks.  No abdominal pain and he has never had abdominal surgery.  States omeprazole 40 mg daily Hypertension, follow-up  BP Readings from Last 3 Encounters:  02/12/20 122/85  01/24/20 122/81  12/13/19 118/80   Wt Readings from Last 3 Encounters:  02/12/20 224 lb (101.6 kg)  01/24/20 224 lb (101.6 kg)  12/13/19 225 lb 3.2 oz (102.2 kg)     He was last seen for hypertension 2 months ago.  BP at that visit was 12/13/2019. Management since that visit includes; continue medications. He reports good compliance with treatment. He is not having side effects. none He is not exercising. He is adherent to low salt diet.   Outside blood pressures are 125/78.  He does not smoke.  Use of agents associated with hypertension: NSAIDS.   --------------------------------------------------------------------  Right groin pain From 01/24/2020-Most likely hip OA at this time. Carefully use Naprosyn and patient on Eliquis. X-ray obtained.   Patient states groin pain has resolved.  Paroxysmal atrial fibrillation (HCC) From 01/24/2020-Eliquis.  CKD stage 3 due to type 2 diabetes mellitus ([place]) From 01/24/2020-On lisinopril 40 and diabetes followed by endocrinology.   Patient states he has had a change in his appetite.  Patient states for the past 2-3 weeks he has had nausea and increased gas after every meal. Patient states nausea lasts up to 15-20 minutes.       Medications: Outpatient Medications Prior to Visit  Medication Sig  . acetaminophen-codeine (TYLENOL #3) 300-30 MG tablet Take 1 tablet by mouth every 6 (six) hours as needed for moderate pain.  Marland Kitchen apixaban (ELIQUIS) 5 MG TABS tablet Take 5 mg by mouth daily.   Marland Kitchen atorvastatin (LIPITOR) 40 MG tablet Take 40 mg by mouth daily.  Marland Kitchen buPROPion (WELLBUTRIN) 75 MG tablet Take 1 tablet by mouth twice daily  . diltiazem (CARDIZEM CD) 120 MG 24 hr capsule Take 1 capsule (120 mg total) by mouth daily.  . furosemide (LASIX) 40 MG tablet Take 1 tablet (40 mg total) by mouth 2 (two) times daily. (Patient taking differently: Take 40 mg by mouth daily. )  . gabapentin (NEURONTIN) 400 MG capsule Take 400 mg by mouth daily. AT LUNCH  . glipiZIDE (GLUCOTROL) 10 MG tablet TAKE ONE TABLET BY MOUTH THREE TIMES DAILY BEFORE MEALS (Patient taking differently: 10 mg 2 (two) times daily before a meal. )  . glucose blood (ONE TOUCH ULTRA TEST) test strip Use 3 (three) times daily One touch ultra test strips e11.29  . insulin NPH Human (HUMULIN N,NOVOLIN N) 100 UNIT/ML injection 28 Units 2 (two) times daily before a meal.   . metFORMIN (GLUCOPHAGE) 1000 MG tablet TAKE 1 TABLET BY MOUTH TWICE DAILY WITH A MEAL (Patient taking differently: daily with breakfast. )  . methimazole (TAPAZOLE) 5 MG  tablet Take 5 mg by mouth.  . metoprolol succinate (TOPROL-XL) 100 MG 24 hr tablet Take 100 mg by mouth daily.   . multivitamin-iron-minerals-folic acid (THERAPEUTIC-M) TABS tablet Take 1 tablet by mouth daily.  Marland Kitchen omeprazole (PRILOSEC) 40 MG capsule Take 1 capsule by mouth once daily  . oxybutynin (DITROPAN) 5 MG tablet 1 tablet at bedtime  . sertraline (ZOLOFT) 100 MG tablet Take 1 tablet by mouth once daily  . sildenafil (VIAGRA) 100 MG tablet Take by mouth.  . spironolactone  (ALDACTONE) 25 MG tablet Take 25 mg by mouth daily.  . TRULICITY 1.5 0000000 SOPN Inject into the skin once a week.  . venlafaxine XR (EFFEXOR-XR) 150 MG 24 hr capsule TAKE 1 CAPSULE BY MOUTH ONCE DAILY WITH BREAKFAST  . dutasteride (AVODART) 0.5 MG capsule Take 1 capsule (0.5 mg total) by mouth daily.  Marland Kitchen lisinopril (ZESTRIL) 40 MG tablet   . naproxen (NAPROSYN) 500 MG tablet Take 1 tablet (500 mg total) by mouth 2 (two) times daily with a meal. (Patient not taking: Reported on 02/12/2020)   No facility-administered medications prior to visit.    Review of Systems  Constitutional: Negative for appetite change, chills and fever.  Eyes: Negative.   Respiratory: Negative for chest tightness, shortness of breath and wheezing.   Cardiovascular: Negative for chest pain and palpitations.  Gastrointestinal: Positive for nausea. Negative for abdominal pain and vomiting.  Endocrine: Negative.   Genitourinary: Negative.   Allergic/Immunologic: Negative.   Neurological: Negative.   Hematological: Negative.   Psychiatric/Behavioral: Negative.       Objective    BP 122/85 (BP Location: Right Arm, Patient Position: Sitting, Cuff Size: Large)   Pulse 94   Temp (!) 97.3 F (36.3 C) (Other (Comment))   Resp 18   Ht 5\' 9"  (1.753 m)   Wt 224 lb (101.6 kg)   SpO2 95%   BMI 33.08 kg/m    Physical Exam Vitals reviewed.  Constitutional:      Appearance: Normal appearance.  HENT:     Head: Normocephalic and atraumatic.     Right Ear: External ear normal.     Left Ear: External ear normal.     Nose: Nose normal.     Mouth/Throat:     Pharynx: Oropharynx is clear.  Eyes:     General: No scleral icterus. Cardiovascular:     Rate and Rhythm: Normal rate and regular rhythm.     Pulses: Normal pulses.     Heart sounds: Normal heart sounds.  Pulmonary:     Effort: Pulmonary effort is normal.     Breath sounds: Normal breath sounds.  Abdominal:     General: Abdomen is flat.      Palpations: Abdomen is soft.     Tenderness: There is no abdominal tenderness.     Comments: Negative Murphy sign  Musculoskeletal:     Right lower leg: No edema.     Left lower leg: No edema.  Skin:    General: Skin is dry.  Neurological:     General: No focal deficit present.     Mental Status: He is alert and oriented to person, place, and time. Mental status is at baseline.  Psychiatric:        Mood and Affect: Mood normal.        Behavior: Behavior normal.        Thought Content: Thought content normal.        Judgment: Judgment normal.  No results found for any visits on 02/12/20.  Assessment & Plan     1. Nausea Tritus versus possible gallbladder disease.  Pain abdominal ultrasound and start sucralfate.  Obtain labs.  Follow-up 2 weeks. - sucralfate (CARAFATE) 1 g tablet; Take 1 tablet (1 g total) by mouth 3 (three) times daily with meals.  Dispense: 90 tablet; Refill: 3 - US Abdomen Complete - CBC w/Diff/Platelet - Comprehensive Metabolic Panel (CMET) - Lipase  2. Essential hypertension Controlled.  3. Right groin pain Cough completely.  4. CKD stage 3 due to type 2 diabetes mellitus ([place]) Continue present diabetes regimen.  5. Gas pain See #1.  Not think this is a small bowel obstruction or bowel obstruction as he never had the surgery.  Sound pending - sucralfate (CARAFATE) 1 g tablet; Take 1 tablet (1 g total) by mouth 3 (three) times daily with meals.  Dispense: 90 tablet; Refill: 3 - US Abdomen Complete - CBC w/Diff/Platelet - Comprehensive Metabolic Panel (CMET) - Lipase   No follow-ups on file.         Aleen Marston Cranford Mon, MD  [facility] [phone] (phone) [phone] (fax)  [place]

## 2020-02-08 NOTE — Progress Notes (Deleted)
Established patient visit   Patient: Tom Moore   DOB: 1945/10/26   74 y.o. Male  MRN: [id number] Visit Date: 02/14/2020  Today's healthcare provider: [name], MD   No chief complaint on file.  Subjective    HPI ***  {Show patient history (optional):23778::" "}   Medications: Outpatient Medications Prior to Visit  Medication Sig  . acetaminophen-codeine (TYLENOL #3) 300-30 MG tablet Take 1 tablet by mouth every 6 (six) hours as needed for moderate pain.  Marland Kitchen apixaban (ELIQUIS) 5 MG TABS tablet Take 5 mg by mouth daily.   Marland Kitchen atorvastatin (LIPITOR) 40 MG tablet Take 40 mg by mouth daily.  Marland Kitchen buPROPion (WELLBUTRIN) 75 MG tablet Take 1 tablet by mouth twice daily  . diltiazem (CARDIZEM CD) 120 MG 24 hr capsule Take 1 capsule (120 mg total) by mouth daily.  Marland Kitchen dutasteride (AVODART) 0.5 MG capsule Take 1 capsule (0.5 mg total) by mouth daily.  . furosemide (LASIX) 40 MG tablet Take 1 tablet (40 mg total) by mouth 2 (two) times daily. (Patient taking differently: Take 40 mg by mouth daily. )  . gabapentin (NEURONTIN) 400 MG capsule Take 400 mg by mouth daily. AT LUNCH  . glipiZIDE (GLUCOTROL) 10 MG tablet TAKE ONE TABLET BY MOUTH THREE TIMES DAILY BEFORE MEALS (Patient taking differently: 10 mg 2 (two) times daily before a meal. )  . glucose blood (ONE TOUCH ULTRA TEST) test strip Use 3 (three) times daily One touch ultra test strips e11.29  . insulin NPH Human (HUMULIN N,NOVOLIN N) 100 UNIT/ML injection 28 Units 2 (two) times daily before a meal.   . lisinopril (ZESTRIL) 40 MG tablet   . metFORMIN (GLUCOPHAGE) 1000 MG tablet TAKE 1 TABLET BY MOUTH TWICE DAILY WITH A MEAL (Patient taking differently: daily with breakfast. )  . methimazole (TAPAZOLE) 5 MG tablet Take 5 mg by mouth.  . metoprolol succinate (TOPROL-XL) 100 MG 24 hr tablet Take 100 mg by mouth daily.   . multivitamin-iron-minerals-folic acid (THERAPEUTIC-M) TABS tablet Take 1 tablet by mouth daily.  .  naproxen (NAPROSYN) 500 MG tablet Take 1 tablet (500 mg total) by mouth 2 (two) times daily with a meal.  . omeprazole (PRILOSEC) 40 MG capsule Take 1 capsule by mouth once daily  . oxybutynin (DITROPAN) 5 MG tablet 1 tablet at bedtime  . sertraline (ZOLOFT) 100 MG tablet Take 1 tablet by mouth once daily  . sildenafil (VIAGRA) 100 MG tablet Take by mouth.  . spironolactone (ALDACTONE) 25 MG tablet Take 25 mg by mouth daily.  . TRULICITY 1.5 0000000 SOPN Inject into the skin once a week.  . venlafaxine XR (EFFEXOR-XR) 150 MG 24 hr capsule TAKE 1 CAPSULE BY MOUTH ONCE DAILY WITH BREAKFAST   No facility-administered medications prior to visit.    Review of Systems  Constitutional: Negative for appetite change, chills and fever.  Respiratory: Negative for chest tightness, shortness of breath and wheezing.   Cardiovascular: Negative for chest pain and palpitations.  Gastrointestinal: Negative for abdominal pain, nausea and vomiting.    {CBC  CMET  Lipids  Results Review (optional):23779::" "}  Objective    There were no vitals taken for this visit. {Show previous vital signs (optional):23777::" "}  Physical Exam  ***  No results found for any visits on 02/14/20.  Assessment & Plan     ***  No follow-ups on file.      {provider attestation***:1}   [name], MD  [facility]  Practice [phone] (phone) [phone] (fax)  [place]

## 2020-02-12 ENCOUNTER — Encounter: Payer: Self-pay | Admitting: Family Medicine

## 2020-02-12 ENCOUNTER — Other Ambulatory Visit: Payer: Self-pay

## 2020-02-12 ENCOUNTER — Ambulatory Visit (INDEPENDENT_AMBULATORY_CARE_PROVIDER_SITE_OTHER): Payer: Medicare Other | Admitting: Family Medicine

## 2020-02-12 VITALS — BP 122/85 | HR 94 | Temp 97.3°F | Resp 18 | Ht 69.0 in | Wt 224.0 lb

## 2020-02-12 DIAGNOSIS — I10 Essential (primary) hypertension: Secondary | ICD-10-CM

## 2020-02-12 DIAGNOSIS — R141 Gas pain: Secondary | ICD-10-CM

## 2020-02-12 DIAGNOSIS — N183 Chronic kidney disease, stage 3 unspecified: Secondary | ICD-10-CM

## 2020-02-12 DIAGNOSIS — R110 Nausea: Secondary | ICD-10-CM

## 2020-02-12 DIAGNOSIS — R1031 Right lower quadrant pain: Secondary | ICD-10-CM | POA: Diagnosis not present

## 2020-02-12 DIAGNOSIS — E1122 Type 2 diabetes mellitus with diabetic chronic kidney disease: Secondary | ICD-10-CM | POA: Diagnosis not present

## 2020-02-12 MED ORDER — SUCRALFATE 1 G PO TABS: 1.0000 g | ORAL_TABLET | Freq: Three times a day (TID) | ORAL | 3 refills | Status: DC

## 2020-02-14 ENCOUNTER — Other Ambulatory Visit: Payer: Self-pay | Admitting: Urology

## 2020-02-14 ENCOUNTER — Ambulatory Visit: Payer: Self-pay | Admitting: Family Medicine

## 2020-02-14 ENCOUNTER — Other Ambulatory Visit: Payer: Self-pay | Admitting: Family Medicine

## 2020-02-14 DIAGNOSIS — F324 Major depressive disorder, single episode, in partial remission: Secondary | ICD-10-CM

## 2020-02-14 LAB — COMPREHENSIVE METABOLIC PANEL
ALT: 32 IU/L (ref 0–44)
AST: 27 IU/L (ref 0–40)
Albumin/Globulin Ratio: 1.8 (ref 1.2–2.2)
Albumin: 4.4 g/dL (ref 3.7–4.7)
Alkaline Phosphatase: 107 IU/L (ref 48–121)
BUN/Creatinine Ratio: 17 (ref 10–24)
BUN: 34 mg/dL — ABNORMAL HIGH (ref 8–27)
Bilirubin Total: 0.5 mg/dL (ref 0.0–1.2)
CO2: 19 mmol/L — ABNORMAL LOW (ref 20–29)
Calcium: 9.8 mg/dL (ref 8.6–10.2)
Chloride: 100 mmol/L (ref 96–106)
Creatinine, Ser: 2.04 mg/dL — ABNORMAL HIGH (ref 0.76–1.27)
GFR calc Af Amer: 36 mL/min/{1.73_m2} — ABNORMAL LOW (ref >59)
GFR calc non Af Amer: 31 mL/min/{1.73_m2} — ABNORMAL LOW (ref >59)
Globulin, Total: 2.5 g/dL (ref 1.5–4.5)
Glucose: 198 mg/dL — ABNORMAL HIGH (ref 65–99)
Potassium: 5 mmol/L (ref 3.5–5.2)
Sodium: 141 mmol/L (ref 134–144)
Total Protein: 6.9 g/dL (ref 6.0–8.5)

## 2020-02-14 LAB — CBC WITH DIFFERENTIAL/PLATELET
Basophils Absolute: 0 10*3/uL (ref 0.0–0.2)
Basos: 1 %
EOS (ABSOLUTE): 0.3 10*3/uL (ref 0.0–0.4)
Eos: 5 %
Hematocrit: 42.2 % (ref 37.5–51.0)
Hemoglobin: 14.1 g/dL (ref 13.0–17.7)
Immature Grans (Abs): 0.1 10*3/uL (ref 0.0–0.1)
Immature Granulocytes: 1 %
Lymphocytes Absolute: 1.6 10*3/uL (ref 0.7–3.1)
Lymphs: 27 %
MCH: 31.2 pg (ref 26.6–33.0)
MCHC: 33.4 g/dL (ref 31.5–35.7)
MCV: 93 fL (ref 79–97)
Monocytes Absolute: 0.5 10*3/uL (ref 0.1–0.9)
Monocytes: 9 %
Neutrophils Absolute: 3.4 10*3/uL (ref 1.4–7.0)
Neutrophils: 57 %
Platelets: 160 10*3/uL (ref 150–450)
RBC: 4.52 x10E6/uL (ref 4.14–5.80)
RDW: 14.3 % (ref 11.6–15.4)
WBC: 5.8 10*3/uL (ref 3.4–10.8)

## 2020-02-14 LAB — LIPASE: Lipase: 38 U/L (ref 13–78)

## 2020-02-18 ENCOUNTER — Other Ambulatory Visit: Payer: Self-pay

## 2020-02-18 ENCOUNTER — Ambulatory Visit
Admission: RE | Admit: 2020-02-18 | Discharge: 2020-02-18 | Disposition: A | Discharge status: Home / Self Care | Payer: Medicare Other | Source: Ambulatory Visit | Attending: Family Medicine | Admitting: Family Medicine

## 2020-02-18 DIAGNOSIS — R110 Nausea: Secondary | ICD-10-CM | POA: Insufficient documentation

## 2020-02-18 DIAGNOSIS — R141 Gas pain: Secondary | ICD-10-CM | POA: Diagnosis present

## 2020-02-18 IMAGING — US US ABDOMEN COMPLETE
1 series · 14 of 25 positions shown · non-contrast
Comparison: [DATE]

CLINICAL DATA: Nausea, gas

EXAM:
ABDOMEN ULTRASOUND COMPLETE

[Series 1: us abdomen complete · 14 of 49 slices shown]
[im 1/49]
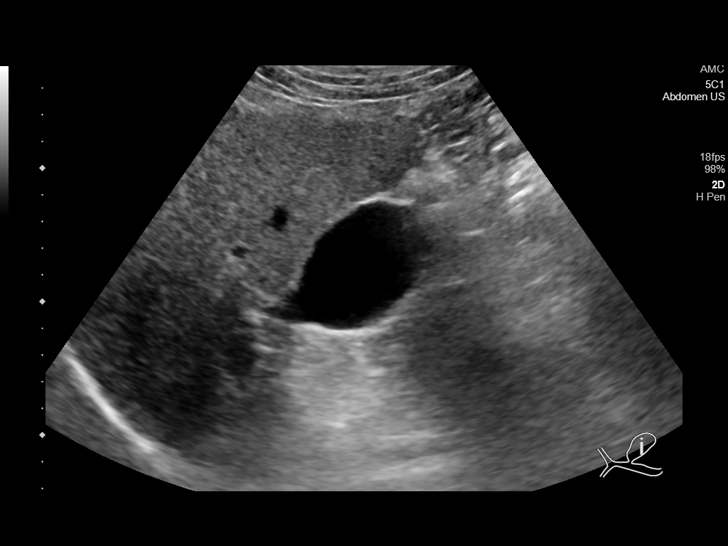
[im 5/49]
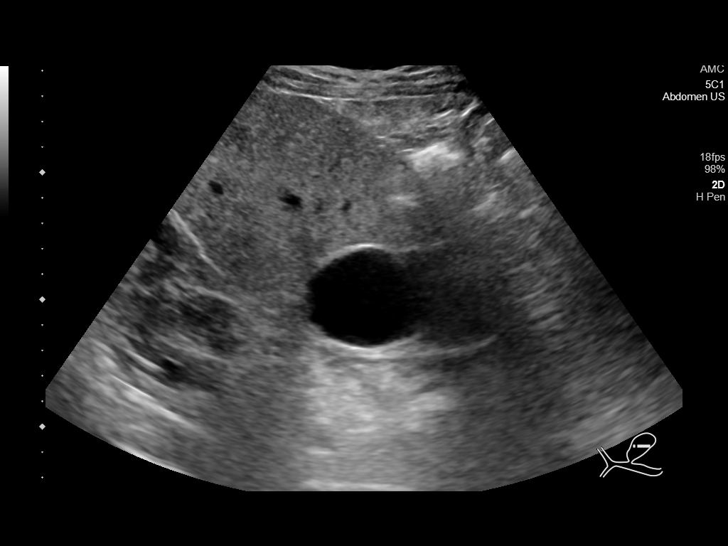
[im 9/49]
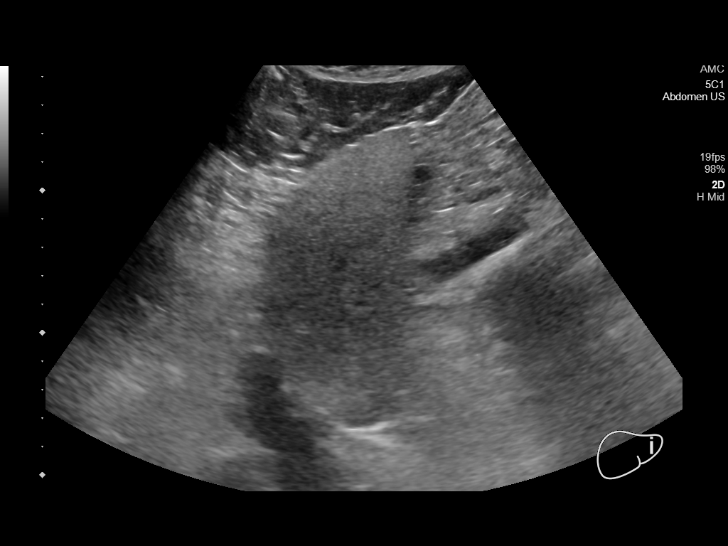
[im 13/49]
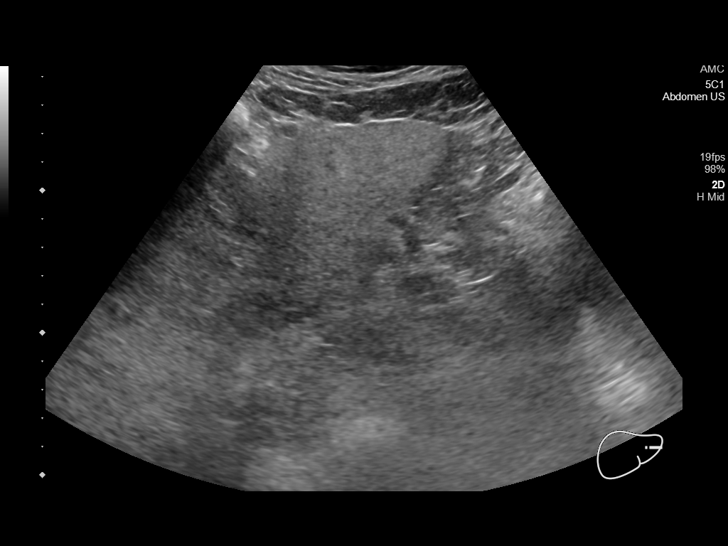
[im 17/49]
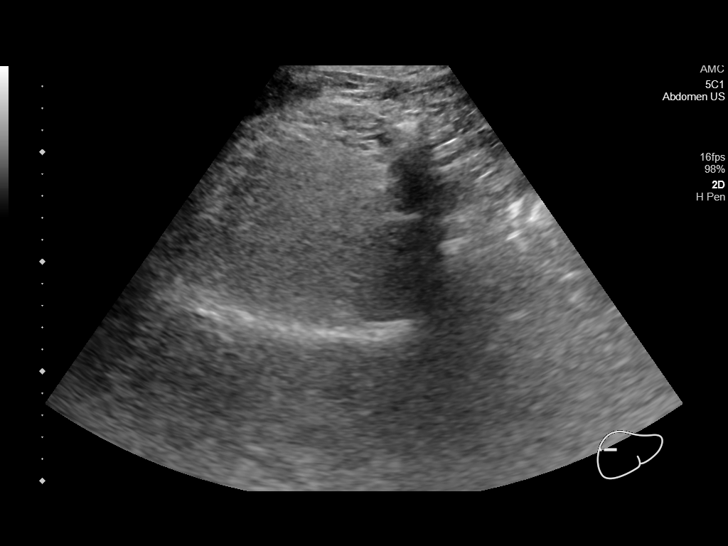
[im 19/49]
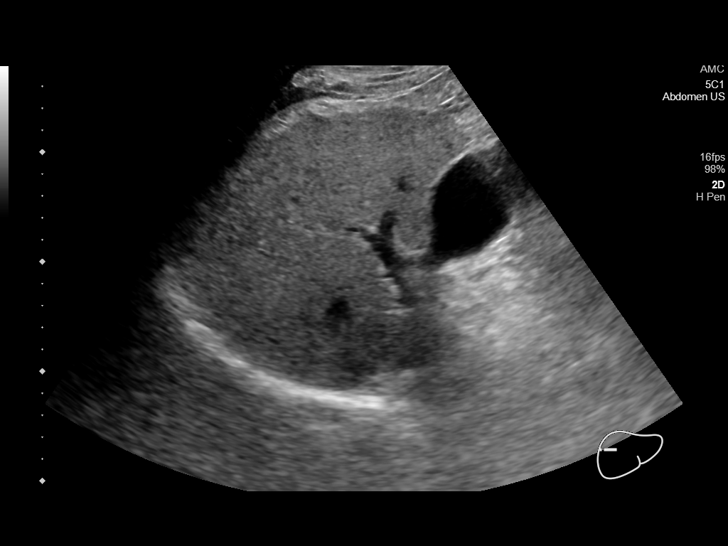
[im 23/49]
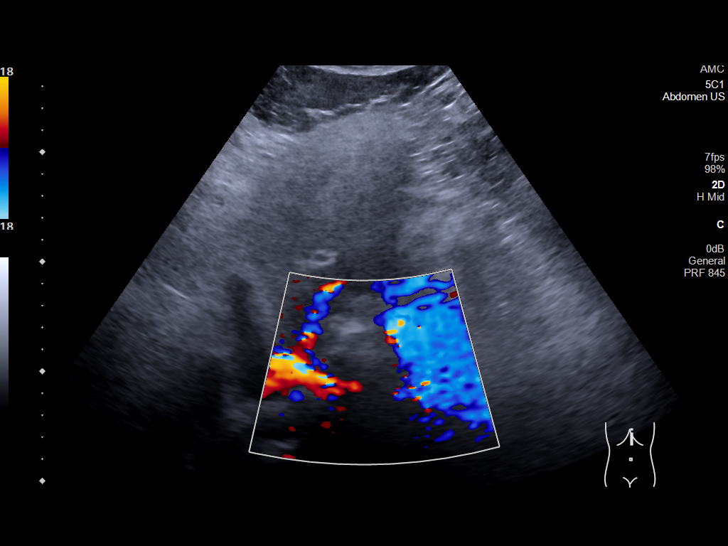
[im 27/49]
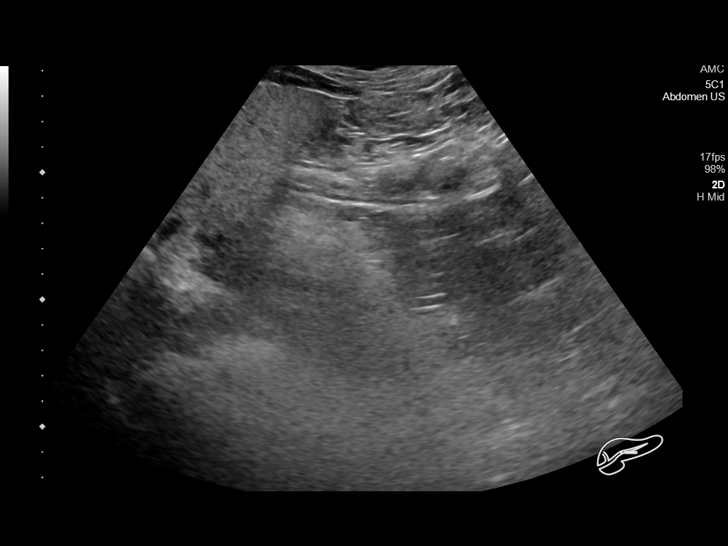
[im 31/49]
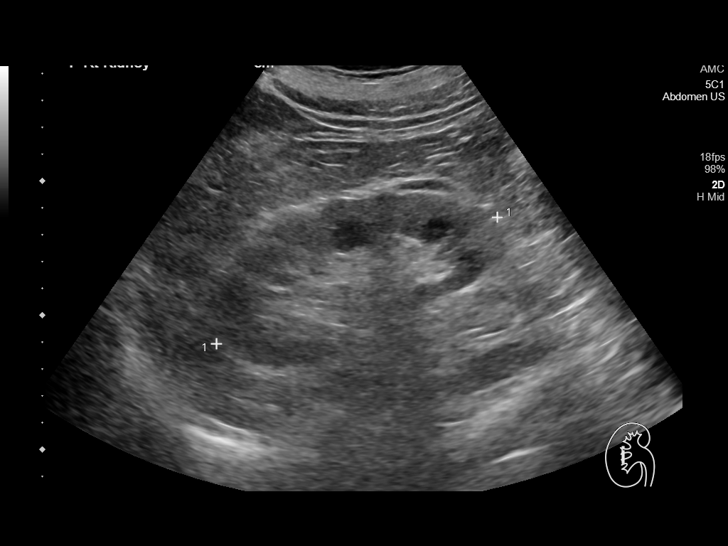
[im 33/49]
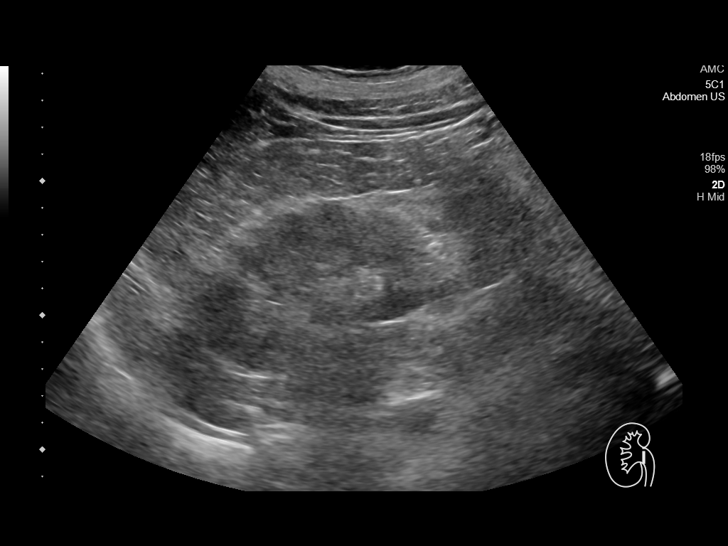
[im 37/49]
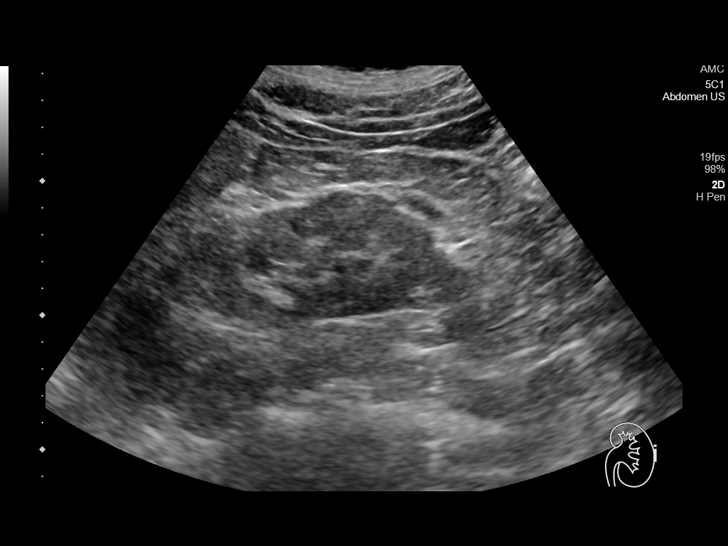
[im 41/49]
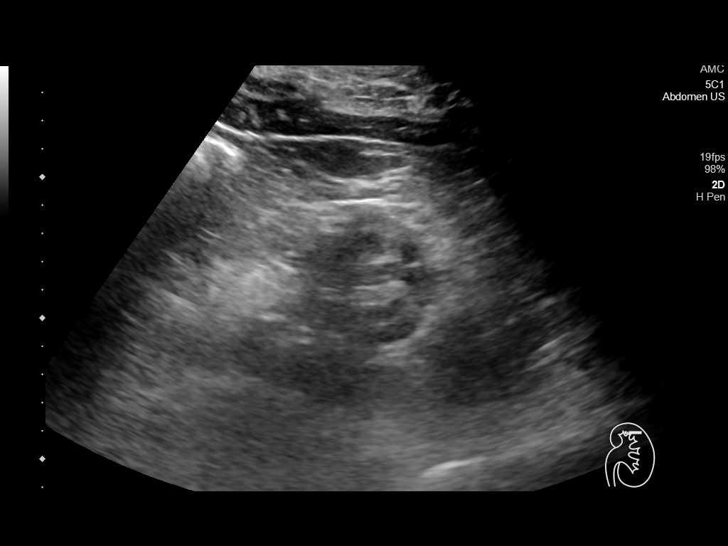
[im 45/49]
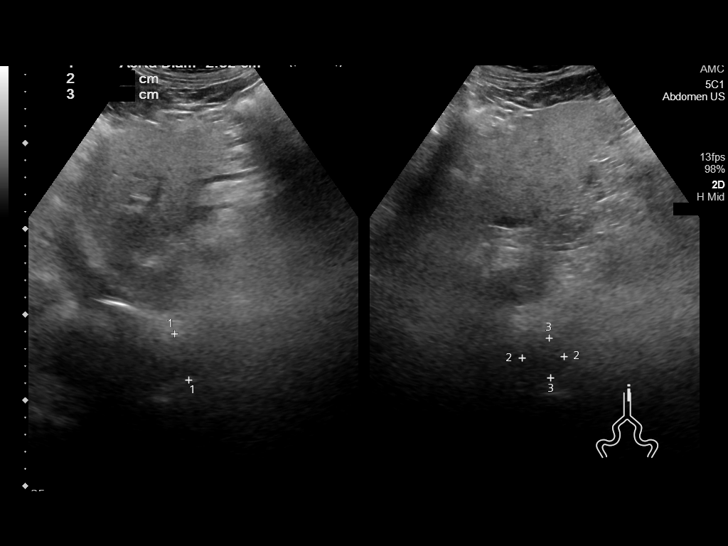
[im 49/49]
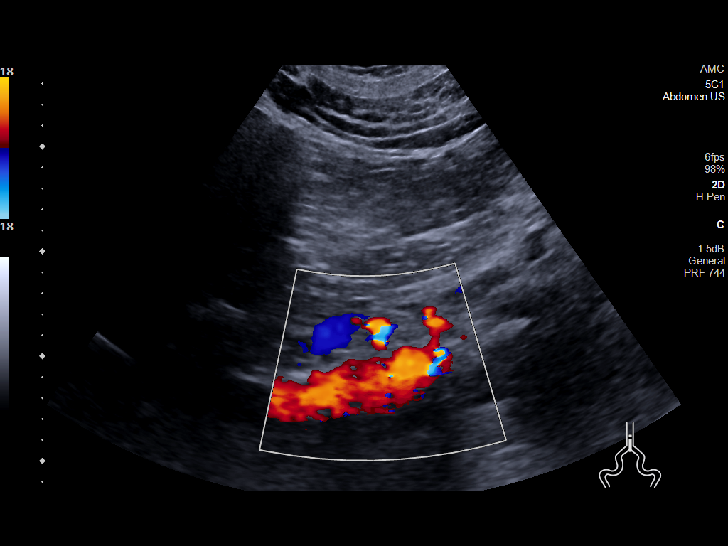

[14 of 25 positions shown; findings below may reference images not displayed]

FINDINGS: Gallbladder: No gallstones or wall thickening visualized. No
sonographic Murphy sign noted by sonographer.

Common bile duct: Diameter: Normal caliber, 4 mm

Liver: Coarsened, increased echotexture compatible with fatty
infiltration. No focal abnormality. Portal vein is patent on color
Doppler imaging with normal direction of blood flow towards the
liver.

IVC: No abnormality visualized.

Pancreas: Visualized portion unremarkable.

Spleen: Size and appearance within normal limits.

Right Kidney: Length: 11.5 cm. Echogenicity within normal limits. No
mass or hydronephrosis visualized.

Left Kidney: Length: 12.1 cm. Echogenicity within normal limits. No
mass or hydronephrosis visualized.

Abdominal aorta: No aneurysm visualized.

Other findings: None.
IMPRESSION: Fatty infiltration of the liver.

No acute findings.

## 2020-02-19 ENCOUNTER — Telehealth: Payer: Self-pay

## 2020-02-19 NOTE — Telephone Encounter (Signed)
Patient advised.

## 2020-02-19 NOTE — Telephone Encounter (Signed)
-----   Message from Jerrol Banana., MD sent at 02/19/2020  9:38 AM EDT ----- Ultrasound in normal range.

## 2020-02-20 NOTE — Progress Notes (Signed)
Trena Platt Cummings,acting as a scribe for [name], MD.,have documented all relevant documentation on the behalf of [name], MD,as directed by  [name], MD while in the presence of [name], MD.  Established patient visit   Patient: Tom Moore   DOB: [birth year]   74 y.o. Male  MRN: [id number] Visit Date: 02/26/2020  Today's healthcare provider: [name], MD   Chief Complaint  Patient presents with  . Nausea    Follow Up    Subjective    HPI   Patient presents today for a 2 week follow up on nausea, patient says that he is no longer having nausea and that he is feeling much better.   Nausea From 02/12/2020-Tritus versus possible gallbladder disease.  Pain abdominal ultrasound and start sucralfate.  Obtain labs.  Follow-up 2 weeks. Given rx for sucralfate (CARAFATE) 1 g. US Abdomen obtained showing-normal. Labs checked showing-stable but kidney functions abnormal. Encouraged water during the day and avoid anti-inflammatory drugs. Advised he can take Tylenol but not Advil or Aleve and that type medication.     Medications: Outpatient Medications Prior to Visit  Medication Sig  . acetaminophen-codeine (TYLENOL #3) 300-30 MG tablet Take 1 tablet by mouth every 6 (six) hours as needed for moderate pain.  Marland Kitchen apixaban (ELIQUIS) 5 MG TABS tablet Take 5 mg by mouth daily.   Marland Kitchen atorvastatin (LIPITOR) 40 MG tablet Take 40 mg by mouth daily.  Marland Kitchen buPROPion (WELLBUTRIN) 75 MG tablet Take 1 tablet by mouth twice daily  . diltiazem (CARDIZEM CD) 120 MG 24 hr capsule Take 1 capsule (120 mg total) by mouth daily.  Marland Kitchen dutasteride (AVODART) 0.5 MG capsule Take 1 capsule (0.5 mg total) by mouth daily.  . furosemide (LASIX) 40 MG tablet Take 1 tablet (40 mg total) by mouth 2 (two) times daily. (Patient taking differently: Take 40 mg by mouth daily. )  . glipiZIDE (GLUCOTROL) 10 MG tablet TAKE ONE TABLET BY MOUTH THREE TIMES DAILY BEFORE MEALS  (Patient taking differently: 10 mg 2 (two) times daily before a meal. )  . glucose blood (ONE TOUCH ULTRA TEST) test strip Use 3 (three) times daily One touch ultra test strips e11.29  . insulin NPH Human (HUMULIN N,NOVOLIN N) 100 UNIT/ML injection 28 Units 2 (two) times daily before a meal.   . metFORMIN (GLUCOPHAGE) 1000 MG tablet TAKE 1 TABLET BY MOUTH TWICE DAILY WITH A MEAL (Patient taking differently: daily with breakfast. )  . methimazole (TAPAZOLE) 5 MG tablet Take 5 mg by mouth.  . metoprolol succinate (TOPROL-XL) 100 MG 24 hr tablet Take 100 mg by mouth daily.   . multivitamin-iron-minerals-folic acid (THERAPEUTIC-M) TABS tablet Take 1 tablet by mouth daily.  . naproxen (NAPROSYN) 500 MG tablet Take 1 tablet (500 mg total) by mouth 2 (two) times daily with a meal.  . omeprazole (PRILOSEC) 40 MG capsule Take 1 capsule by mouth once daily  . oxybutynin (DITROPAN) 5 MG tablet 1 tablet at bedtime  . sertraline (ZOLOFT) 100 MG tablet Take 1 tablet by mouth once daily  . sildenafil (VIAGRA) 100 MG tablet Take by mouth.  . spironolactone (ALDACTONE) 25 MG tablet Take 25 mg by mouth daily.  . sucralfate (CARAFATE) 1 g tablet Take 1 tablet (1 g total) by mouth 3 (three) times daily with meals.  . TRULICITY 1.5 0000000 SOPN Inject into the skin once a week.  . venlafaxine XR (EFFEXOR-XR) 150 MG 24 hr capsule TAKE  1 CAPSULE BY MOUTH ONCE DAILY WITH BREAKFAST  . gabapentin (NEURONTIN) 400 MG capsule Take 400 mg by mouth daily. AT LUNCH  . lisinopril (ZESTRIL) 40 MG tablet    No facility-administered medications prior to visit.    Review of Systems  Constitutional: Negative for appetite change, chills and fever.  HENT: Negative.   Eyes: Negative.   Respiratory: Negative for chest tightness, shortness of breath and wheezing.   Cardiovascular: Negative for chest pain and palpitations.  Gastrointestinal: Negative for abdominal pain, nausea and vomiting.  Endocrine: Negative.   Genitourinary:  Negative.   Allergic/Immunologic: Negative.   Hematological: Negative.   Psychiatric/Behavioral: Negative.       Objective    BP 105/70 (BP Location: Right Arm, Patient Position: Sitting, Cuff Size: Large)   Pulse 74   Temp (!) 96.8 F (36 C) (Temporal)   Ht 5\' 9"  (1.753 m)   Wt 228 lb 3.2 oz (103.5 kg)   BMI 33.70 kg/m    Physical Exam Vitals reviewed.  Constitutional:      Appearance: Normal appearance.  HENT:     Head: Normocephalic and atraumatic.     Right Ear: External ear normal.     Left Ear: External ear normal.  Eyes:     General: No scleral icterus.    Conjunctiva/sclera: Conjunctivae normal.  Cardiovascular:     Rate and Rhythm: Normal rate and regular rhythm.     Pulses: Normal pulses.     Heart sounds: Normal heart sounds.  Pulmonary:     Effort: Pulmonary effort is normal.     Breath sounds: Normal breath sounds.  Abdominal:     Palpations: Abdomen is soft.  Musculoskeletal:     Right lower leg: No edema.     Left lower leg: No edema.  Skin:    General: Skin is warm and dry.  Neurological:     General: No focal deficit present.     Mental Status: He is alert and oriented to person, place, and time.  Psychiatric:        Mood and Affect: Mood normal.        Behavior: Behavior normal.        Thought Content: Thought content normal.        Judgment: Judgment normal.       No results found for any visits on 02/26/20.  Assessment & Plan     1. Nausea Improved on sucralfate.  Continue indefinitely.  2. Essential hypertension Well controlled off of lisinopril. Continue current medications Recheck metabolic panel F/u in 6 months  - Renal Function Panel  3. Controlled type 2 diabetes mellitus with stage 3 chronic kidney disease, unspecified whether long term insulin use ([place]) Well controlled with last A1c per endocrine.  It was 7.0 in February Continue current medications UTD on vaccines, eye exam, foot exam On ACEi On Statin Discussed  diet and exercise F/u in 2 months - Renal Function Panel  4. CKD stage 3 due to type 2 diabetes mellitus ([place]) Recheck renal panel.  May need to go back to low-dose ARB in the future. - Renal Function Panel   No follow-ups on file.      I, [name], MD, have reviewed all documentation for this visit. The documentation on 03/01/20 for the exam, diagnosis, procedures, and orders are all accurate and complete.    Dior Stepter Cranford Mon, MD  [facility] [phone] (phone) [phone] (fax)  [place]

## 2020-02-26 ENCOUNTER — Other Ambulatory Visit: Payer: Self-pay

## 2020-02-26 ENCOUNTER — Encounter: Payer: Self-pay | Admitting: Family Medicine

## 2020-02-26 ENCOUNTER — Ambulatory Visit (INDEPENDENT_AMBULATORY_CARE_PROVIDER_SITE_OTHER): Payer: Medicare Other | Admitting: Family Medicine

## 2020-02-26 VITALS — BP 105/70 | HR 74 | Temp 96.8°F | Ht 69.0 in | Wt 228.2 lb

## 2020-02-26 DIAGNOSIS — E1122 Type 2 diabetes mellitus with diabetic chronic kidney disease: Secondary | ICD-10-CM | POA: Diagnosis not present

## 2020-02-26 DIAGNOSIS — N183 Chronic kidney disease, stage 3 unspecified: Secondary | ICD-10-CM

## 2020-02-26 DIAGNOSIS — R110 Nausea: Secondary | ICD-10-CM

## 2020-02-26 DIAGNOSIS — I10 Essential (primary) hypertension: Secondary | ICD-10-CM

## 2020-02-26 NOTE — Patient Instructions (Signed)
PLEASE BRING IN ALL MEDICATION TO NEXT VISIT!!!

## 2020-02-27 LAB — RENAL FUNCTION PANEL
Albumin: 4.4 g/dL (ref 3.7–4.7)
BUN/Creatinine Ratio: 14 (ref 10–24)
BUN: 22 mg/dL (ref 8–27)
CO2: 23 mmol/L (ref 20–29)
Calcium: 9.4 mg/dL (ref 8.6–10.2)
Chloride: 106 mmol/L (ref 96–106)
Creatinine, Ser: 1.52 mg/dL — ABNORMAL HIGH (ref 0.76–1.27)
GFR calc Af Amer: 52 mL/min/{1.73_m2} — ABNORMAL LOW (ref >59)
GFR calc non Af Amer: 45 mL/min/{1.73_m2} — ABNORMAL LOW (ref >59)
Glucose: 156 mg/dL — ABNORMAL HIGH (ref 65–99)
Phosphorus: 3.5 mg/dL (ref 2.8–4.1)
Potassium: 5.1 mmol/L (ref 3.5–5.2)
Sodium: 145 mmol/L — ABNORMAL HIGH (ref 134–144)

## 2020-03-03 ENCOUNTER — Telehealth: Payer: Self-pay

## 2020-03-03 NOTE — Telephone Encounter (Signed)
Patient advised.

## 2020-03-03 NOTE — Telephone Encounter (Signed)
-----   Message from Jerrol Banana., MD sent at 02/29/2020 10:19 AM EDT ----- Kidney function better from last visit.  Continue to stay hydrated.

## 2020-03-10 ENCOUNTER — Other Ambulatory Visit: Payer: Self-pay | Admitting: Urology

## 2020-03-25 ENCOUNTER — Other Ambulatory Visit: Payer: Self-pay | Admitting: Urology

## 2020-04-04 ENCOUNTER — Ambulatory Visit: Payer: Self-pay

## 2020-04-04 NOTE — Telephone Encounter (Signed)
Pt. Reports he has had vomiting on and off x 1 week. "Had this problem in May and I had an ultrasound and it was clear." Has started " back up." Drinking fluids without difficulty. No availability today per Mickel Baas. Appointment made for Monday. Instructed if symptoms worsen, go to ED.  Reason for Disposition  [1] MILD vomiting with diarrhea AND [2] present > 5 days  Answer Assessment - Initial Assessment Questions 1. VOMITING SEVERITY: "How many times have you vomited in the past 24 hours?"     - MILD:  1 - 2 times/day    - MODERATE: 3 - 5 times/day, decreased oral intake without significant weight loss or symptoms of dehydration    - SEVERE: 6 or more times/day, vomits everything or nearly everything, with significant weight loss, symptoms of dehydration      [place] after eating 2. ONSET: "When did the vomiting begin?"       1 week ago 3. FLUIDS: "What fluids or food have you vomited up today?" "Have you been able to keep any fluids down?"     Fluids stay down 4. ABDOMINAL PAIN: "Are your having any abdominal pain?" If yes : "How bad is it and what does it feel like?" (e.g., crampy, dull, intermittent, constant)      No 5. DIARRHEA: "Is there any diarrhea?" If Yes, ask: "How many times today?"      No 6. CONTACTS: "Is there anyone else in the family with the same symptoms?"      No 7. CAUSE: "What do you think is causing your vomiting?"     Unsure 8. HYDRATION STATUS: "Any signs of dehydration?" (e.g., dry mouth [not only dry lips], too weak to stand) "When did you last urinate?"     No 9. OTHER SYMPTOMS: "Do you have any other symptoms?" (e.g., fever, headache, vertigo, vomiting blood or coffee grounds, recent head injury)     No 10. PREGNANCY: "Is there any chance you are pregnant?" "When was your last menstrual period?"       n/a  Protocols used: [facility]

## 2020-04-04 NOTE — Telephone Encounter (Signed)
FYI

## 2020-04-04 NOTE — Telephone Encounter (Signed)
Review. KW

## 2020-04-07 ENCOUNTER — Ambulatory Visit (INDEPENDENT_AMBULATORY_CARE_PROVIDER_SITE_OTHER): Payer: Medicare Other | Admitting: Adult Health

## 2020-04-07 ENCOUNTER — Encounter: Payer: Self-pay | Admitting: Adult Health

## 2020-04-07 DIAGNOSIS — Z5329 Procedure and treatment not carried out because of patient's decision for other reasons: Secondary | ICD-10-CM

## 2020-04-07 NOTE — Telephone Encounter (Signed)
No showed to appointment could not reach by phone multiple attempts 04/07/20 Will need to reschedule.

## 2020-04-07 NOTE — Progress Notes (Signed)
   04/07/20 Provider called patient on home and cell phone at 1121 am  without answer/ CMA Idelle Jo CMA called patient prior to office visit as well without answer.  04/07/20 Called again on cell and home  line 1125 am with no answer left generic message to call office back due to missed appointment on both.   04/07/20 attempted to call patient again 11:29 am for 1120 appointment and no answer again only voicemail home and cell.   No show for telephone visit will need to reschedule if needed/

## 2020-04-07 NOTE — Telephone Encounter (Signed)
Noted and sufficient. Thanks

## 2020-04-07 NOTE — Telephone Encounter (Signed)
Patient has upcoming appt on 04/10/20 with Dr. Rosanna Randy, I tried reaching out to patient with no response. KW

## 2020-04-09 NOTE — Progress Notes (Signed)
Established patient visit  I,[name],acting as a scribe for [name], MD.,have documented all relevant documentation on the behalf of [name], MD,as directed by  [name], MD while in the presence of [name], MD.   Patient: Tom Moore   DOB: [birth year]   74 y.o. Male  MRN: [id number] Visit Date: 04/10/2020  Today's healthcare provider: [name], MD   Chief Complaint  Patient presents with   Follow-up   Chronic Kidney Disease   Subjective    HPI  Patient comes in today for follow-up.  He continues to have postprandial nausea.  He is only able to eat a few bites and has to stop.  He has lost 10 pounds since his last visit.  He has no abdominal pain no fevers chills. The  other symptom he is having is being off balance for the past 2 weeks.  He feels that way if he turns his head quickly. The last issue he brings up is dyspnea on exertion at times.  No chest pain but he is more short of breath with exertion that he used to be.  He is worried about his heart. Nausea From 02/26/2020-Improved on sucralfate.  Continue indefinitely.   Patient states he is still having nausea and has not had any improvement from Carafate.  CKD stage 3 due to type 2 diabetes mellitus ([place]) From 02/26/2020-Recheck renal panel.  May need to go back to low-dose ARB in the future.      Medications: Outpatient Medications Prior to Visit  Medication Sig   acetaminophen-codeine (TYLENOL #3) 300-30 MG tablet Take 1 tablet by mouth every 6 (six) hours as needed for moderate pain.   atorvastatin (LIPITOR) 40 MG tablet Take 40 mg by mouth daily.   diltiazem (CARDIZEM CD) 120 MG 24 hr capsule Take 1 capsule (120 mg total) by mouth daily.   furosemide (LASIX) 40 MG tablet Take 1 tablet (40 mg total) by mouth 2 (two) times daily. (Patient taking differently: Take 40 mg by mouth daily. )   glipiZIDE (GLUCOTROL) 10 MG tablet TAKE ONE TABLET BY  MOUTH THREE TIMES DAILY BEFORE MEALS (Patient taking differently: 10 mg 2 (two) times daily before a meal. )   glucose blood (ONE TOUCH ULTRA TEST) test strip Use 3 (three) times daily One touch ultra test strips e11.29   insulin NPH Human (HUMULIN N,NOVOLIN N) 100 UNIT/ML injection 28 Units 2 (two) times daily before a meal.    metFORMIN (GLUCOPHAGE) 1000 MG tablet TAKE 1 TABLET BY MOUTH TWICE DAILY WITH A MEAL (Patient taking differently: daily with breakfast. )   methimazole (TAPAZOLE) 5 MG tablet Take 5 mg by mouth.   metoprolol succinate (TOPROL-XL) 100 MG 24 hr tablet Take 100 mg by mouth daily.    multivitamin-iron-minerals-folic acid (THERAPEUTIC-M) TABS tablet Take 1 tablet by mouth daily.   omeprazole (PRILOSEC) 40 MG capsule Take 1 capsule by mouth once daily   oxybutynin (DITROPAN) 5 MG tablet 1 tablet at bedtime   sertraline (ZOLOFT) 100 MG tablet Take 1 tablet by mouth once daily   sildenafil (VIAGRA) 100 MG tablet Take by mouth.   spironolactone (ALDACTONE) 25 MG tablet Take 25 mg by mouth daily.   sucralfate (CARAFATE) 1 g tablet Take 1 tablet (1 g total) by mouth 3 (three) times daily with meals.   tamsulosin (FLOMAX) 0.4 MG CAPS capsule TAKE 2 CAPSULES BY MOUTH ONCE DAILY WITH  LUNCH   TRULICITY  1.5 MG/0.5ML SOPN Inject into the skin once a week.   venlafaxine XR (EFFEXOR-XR) 150 MG 24 hr capsule TAKE 1 CAPSULE BY MOUTH ONCE DAILY WITH BREAKFAST   apixaban (ELIQUIS) 5 MG TABS tablet Take 5 mg by mouth daily.  (Patient not taking: Reported on 04/10/2020)   gabapentin (NEURONTIN) 400 MG capsule Take 400 mg by mouth daily. AT LUNCH (Patient not taking: Reported on 04/10/2020)   lisinopril (ZESTRIL) 40 MG tablet  (Patient not taking: Reported on 04/10/2020)   naproxen (NAPROSYN) 500 MG tablet Take 1 tablet (500 mg total) by mouth 2 (two) times daily with a meal. (Patient not taking: Reported on 04/10/2020)   [DISCONTINUED] buPROPion (WELLBUTRIN) 75 MG tablet Take 1  tablet by mouth twice daily (Patient not taking: Reported on 04/10/2020)   [DISCONTINUED] dutasteride (AVODART) 0.5 MG capsule Take 1 capsule (0.5 mg total) by mouth daily. (Patient not taking: Reported on 04/10/2020)   No facility-administered medications prior to visit.    Review of Systems  Constitutional: Negative.   Respiratory: Positive for shortness of breath.   Cardiovascular: Negative.   Gastrointestinal: Positive for nausea.  Endocrine: Negative.   Allergic/Immunologic: Negative.   Neurological: Positive for dizziness.  Hematological: Negative.   Psychiatric/Behavioral: Negative.        Objective    BP 101/69 (BP Location: Left Arm, Patient Position: Sitting, Cuff Size: Large)    Pulse 79    Temp (!) 96.9 F (36.1 C) (Other (Comment))    Resp 18    Ht 5\' 9"  (1.753 m)    Wt 218 lb (98.9 kg)    SpO2 97%    BMI 32.19 kg/m  BP Readings from Last 3 Encounters:  04/10/20 101/69  02/26/20 105/70  02/12/20 122/85   Wt Readings from Last 3 Encounters:  04/10/20 218 lb (98.9 kg)  02/26/20 228 lb 3.2 oz (103.5 kg)  02/12/20 224 lb (101.6 kg)      Physical Exam Vitals and nursing note reviewed.  Constitutional:      Appearance: Normal appearance. He is normal weight.  Eyes:     General: No scleral icterus.    Conjunctiva/sclera: Conjunctivae normal.  Cardiovascular:     Rate and Rhythm: Normal rate and regular rhythm.     Pulses: Normal pulses.     Heart sounds: Normal heart sounds.  Pulmonary:     Effort: Pulmonary effort is normal.     Breath sounds: Normal breath sounds.  Abdominal:     General: There is no distension.     Palpations: Abdomen is soft.     Tenderness: There is no abdominal tenderness.  Musculoskeletal:     Cervical back: Normal range of motion and neck supple.  Skin:    General: Skin is warm and dry.  Neurological:     Mental Status: He is alert and oriented to person, place, and time.     Gait: Gait normal.     Comments: Mild nystagmus  noted.  Psychiatric:        Mood and Affect: Mood normal.        Behavior: Behavior normal.        Thought Content: Thought content normal.        Judgment: Judgment normal.   ECG reveals normal sinus rhythm with very nonspecific ST-T wave changes   No results found for any visits on 04/10/20.  Assessment & Plan     1. Dizziness This appears to be vertigo.  Try meclizine 25 mg 3 times  daily as needed.  May need ENT or neurology referral.  Other than nystagmus exam is otherwise nonfocal. - EKG 12-Lead - Ambulatory referral to Cardiology  2. Vertigo  - meclizine (ANTIVERT) 25 MG tablet; Take 1 tablet (25 mg total) by mouth 3 (three) times daily as needed for dizziness.  Dispense: 30 tablet; Refill: 2  3. Nausea   4. DOE (dyspnea on exertion) Refer to cardiology and patient with diabetes - Ambulatory referral to Cardiology  5. Postprandial nausea  - Ambulatory referral to Gastroenterology  6. CKD stage 3 due to type 2 diabetes mellitus (HCC) It is possible Trulicity is contributing to nausea but he has been on this for some time. Diabetes followed by endocrinology.  7. Major depressive disorder with single episode, in partial remission (HCC) PHQ-9 on next visit  8. Anxiety GAD-7 on next visit.   Return in about 2 months (around 06/11/2020).      I, [name], MD, have reviewed all documentation for this visit. The documentation on 04/13/20 for the exam, diagnosis, procedures, and orders are all accurate and complete.    Ravi Tuccillo Cranford Mon, MD  [facility] [phone] (phone) [phone] (fax)  [place]

## 2020-04-10 ENCOUNTER — Encounter: Payer: Self-pay | Admitting: Family Medicine

## 2020-04-10 ENCOUNTER — Ambulatory Visit (INDEPENDENT_AMBULATORY_CARE_PROVIDER_SITE_OTHER): Payer: Medicare Other | Admitting: Family Medicine

## 2020-04-10 ENCOUNTER — Other Ambulatory Visit: Payer: Self-pay

## 2020-04-10 VITALS — BP 101/69 | HR 79 | Temp 96.9°F | Resp 18 | Ht 69.0 in | Wt 218.0 lb

## 2020-04-10 DIAGNOSIS — R42 Dizziness and giddiness: Secondary | ICD-10-CM | POA: Diagnosis not present

## 2020-04-10 DIAGNOSIS — R0600 Dyspnea, unspecified: Secondary | ICD-10-CM

## 2020-04-10 DIAGNOSIS — R0609 Other forms of dyspnea: Secondary | ICD-10-CM

## 2020-04-10 DIAGNOSIS — F324 Major depressive disorder, single episode, in partial remission: Secondary | ICD-10-CM

## 2020-04-10 DIAGNOSIS — E1122 Type 2 diabetes mellitus with diabetic chronic kidney disease: Secondary | ICD-10-CM | POA: Diagnosis not present

## 2020-04-10 DIAGNOSIS — F419 Anxiety disorder, unspecified: Secondary | ICD-10-CM

## 2020-04-10 DIAGNOSIS — R110 Nausea: Secondary | ICD-10-CM

## 2020-04-10 DIAGNOSIS — N183 Chronic kidney disease, stage 3 unspecified: Secondary | ICD-10-CM

## 2020-04-10 MED ORDER — MECLIZINE HCL 25 MG PO TABS: 25.0000 mg | ORAL_TABLET | Freq: Three times a day (TID) | ORAL | 2 refills | Status: DC | PRN

## 2020-04-10 MED ORDER — BUPROPION HCL 75 MG PO TABS: 75.0000 mg | ORAL_TABLET | Freq: Two times a day (BID) | ORAL | 0 refills | Status: DC

## 2020-04-10 MED ORDER — DUTASTERIDE 0.5 MG PO CAPS: 0.5000 mg | ORAL_CAPSULE | Freq: Every day | ORAL | 1 refills | Status: DC

## 2020-04-10 NOTE — Patient Instructions (Addendum)
Stop taking Naproxen.   [facility] [place] [address] [place],  [place]  [address] Get Driving Directions Main: [phone]

## 2020-04-22 ENCOUNTER — Other Ambulatory Visit: Payer: Self-pay | Admitting: *Deleted

## 2020-04-22 DIAGNOSIS — R110 Nausea: Secondary | ICD-10-CM

## 2020-04-22 DIAGNOSIS — E0590 Thyrotoxicosis, unspecified without thyrotoxic crisis or storm: Secondary | ICD-10-CM

## 2020-04-22 DIAGNOSIS — R42 Dizziness and giddiness: Secondary | ICD-10-CM

## 2020-04-29 ENCOUNTER — Other Ambulatory Visit: Payer: Self-pay | Admitting: Family Medicine

## 2020-05-21 LAB — HM DIABETES EYE EXAM

## 2020-05-22 ENCOUNTER — Other Ambulatory Visit: Payer: Self-pay | Admitting: Family Medicine

## 2020-05-22 DIAGNOSIS — F32A Depression, unspecified: Secondary | ICD-10-CM

## 2020-05-26 ENCOUNTER — Other Ambulatory Visit: Payer: Self-pay

## 2020-05-27 ENCOUNTER — Encounter: Payer: Self-pay | Admitting: *Deleted

## 2020-05-27 ENCOUNTER — Other Ambulatory Visit: Payer: Self-pay

## 2020-05-27 ENCOUNTER — Encounter: Payer: Self-pay | Admitting: Gastroenterology

## 2020-05-27 ENCOUNTER — Ambulatory Visit (INDEPENDENT_AMBULATORY_CARE_PROVIDER_SITE_OTHER): Payer: Medicare Other | Admitting: Gastroenterology

## 2020-05-27 VITALS — BP 122/80 | HR 85 | Temp 98.3°F | Ht 69.0 in | Wt 222.4 lb

## 2020-05-27 DIAGNOSIS — R1310 Dysphagia, unspecified: Secondary | ICD-10-CM

## 2020-05-27 DIAGNOSIS — Z1211 Encounter for screening for malignant neoplasm of colon: Secondary | ICD-10-CM

## 2020-05-27 DIAGNOSIS — R110 Nausea: Secondary | ICD-10-CM | POA: Diagnosis not present

## 2020-05-27 DIAGNOSIS — R6881 Early satiety: Secondary | ICD-10-CM

## 2020-05-27 DIAGNOSIS — R112 Nausea with vomiting, unspecified: Secondary | ICD-10-CM

## 2020-05-27 MED ORDER — NA SULFATE-K SULFATE-MG SULF 17.5-3.13-1.6 GM/177ML PO SOLN: ORAL | 0 refills | Status: DC

## 2020-05-27 NOTE — Progress Notes (Signed)
Tom Moore [address]  [place]  [place], [place] [address]  Main: [phone]  Fax: [phone]   Gastroenterology Consultation  Referring Provider:     Jerrol Banana.,* Primary Care Physician:  Jerrol Banana., MD Reason for Consultation:     Postprandial nausea        HPI:    Chief Complaint  Patient presents with  . Postprandial nausea    Tom Moore is a 74 y.o. y/o male referred for consultation & management  by Dr. Rosanna Randy, Retia Passe., MD.  Patient reports 34-month history of early satiety, and postprandial nausea.  25-year history of diabetes but symptoms just started this year.  I do not have his most recent A1c results but he states it was 6.2.  Was recently started on Trulicity.  No abdominal pain, does report some heartburn and possible intermittent dysphagia as well.  Reports having an upper and lower endoscopy about 10 years ago that was normal.  Did not have any symptoms at that time.  Recent abdominal ultrasound showed no gallstones or wall thickening of the gallbladder.    Past Medical History:  Diagnosis Date  . Anxiety   . Arthritis   . CHF (congestive heart failure) ([place])   . Depression   . Diabetes mellitus without complication ([place])   . Dysrhythmia    atrial fib  . GERD (gastroesophageal reflux disease)   . Hyperlipidemia   . Hypertension   . Hyperthyroidism   . Insomnia   . Neuromuscular disorder ([place])    diabetic neuropathy  . Orthopnea    uses extra pillow to sleep  . Sleep apnea    unable to tolerater CPAP    Past Surgical History:  Procedure Laterality Date  . CARDIAC CATHETERIZATION    . CARPAL TUNNEL RELEASE Right 04/10/2018   Procedure: RELEASE MEDIAN NERVE AT CARPAL TUNNEL;  Surgeon: Earnestine Leys, MD;  Location: [facility] ORS;  Service: Orthopedics;  Laterality: Right;  . CARPAL TUNNEL RELEASE Left 05/08/2018   Procedure: CARPAL TUNNEL RELEASE;  Surgeon: Earnestine Leys, MD;  Location: [facility] ORS;   Service: Orthopedics;  Laterality: Left;  . CATARACT EXTRACTION W/PHACO Right 08/16/2019   Procedure: CATARACT EXTRACTION PHACO AND INTRAOCULAR LENS PLACEMENT ([place]) RIGHT;  Surgeon: Marchia Meiers, MD;  Location: [facility] ORS;  Service: Ophthalmology;  Laterality: Right;  [place] 01:30.3 CDE 13.71 Fluid Pack lot # [id number]  . TONSILLECTOMY      Prior to Admission medications   Medication Sig Start Date End Date Taking? Authorizing Provider  atorvastatin (LIPITOR) 40 MG tablet Take 40 mg by mouth daily.   Yes [provider]  buPROPion [facility]) 75 MG tablet Take 1 tablet by mouth twice daily 05/22/20  Yes Jerrol Banana., MD  diltiazem (CARDIZEM CD) 120 MG 24 hr capsule Take 1 capsule (120 mg total) by mouth daily. 08/27/19 07/04/22 Yes Jerrol Banana., MD  dutasteride (AVODART) 0.5 MG capsule Take 1 capsule (0.5 mg total) by mouth daily. 04/10/20  Yes Jerrol Banana., MD  furosemide (LASIX) 40 MG tablet Take 1 tablet (40 mg total) by mouth 2 (two) times daily. Patient taking differently: Take 40 mg by mouth daily.  06/19/18  Yes Mar Daring, PA-C  gabapentin (NEURONTIN) 400 MG capsule Take 400 mg by mouth daily. AT LUNCH    Yes [provider]  glipiZIDE (GLUCOTROL) 10 MG tablet TAKE ONE TABLET BY MOUTH THREE TIMES DAILY BEFORE MEALS Patient taking  differently: 10 mg 2 (two) times daily before a meal.  06/19/18  Yes Burnette, Anderson [place] M, PA-C  glucose blood (ONE TOUCH ULTRA TEST) test strip Use 3 (three) times daily One touch ultra test strips e11.29 06/02/18  Yes [provider]  insulin NPH Human (HUMULIN N,NOVOLIN N) 100 UNIT/ML injection 28 Units 2 (two) times daily before a meal.  11/01/16  Yes [provider]  metFORMIN (GLUCOPHAGE) 1000 MG tablet TAKE 1 TABLET BY MOUTH TWICE DAILY WITH A MEAL Patient taking differently: daily with breakfast.  04/27/16  Yes Carmon Ginsberg, PA  methimazole (TAPAZOLE) 5 MG tablet Take 5 mg by mouth. 02/15/17   Yes [provider]  metoprolol succinate (TOPROL-XL) 100 MG 24 hr tablet Take 100 mg by mouth daily.  07/19/17  Yes [provider]  multivitamin-iron-minerals-folic acid (THERAPEUTIC-M) TABS tablet Take 1 tablet by mouth daily.   Yes [provider]  omeprazole (PRILOSEC) 40 MG capsule Take 1 capsule by mouth once daily 04/29/20  Yes Jerrol Banana., MD  OZEMPIC, 0.25 OR 0.5 MG/DOSE, 2 MG/1.5ML SOPN 1.5 mLs once a week. 12/15/19  Yes [provider]  sertraline (ZOLOFT) 100 MG tablet Take 1 tablet by mouth once daily 05/22/20  Yes Jerrol Banana., MD  spironolactone (ALDACTONE) 25 MG tablet Take 25 mg by mouth daily. 06/25/19  Yes [provider]  sucralfate (CARAFATE) 1 g tablet Take 1 tablet (1 g total) by mouth 3 (three) times daily with meals. 02/12/20  Yes Jerrol Banana., MD  tamsulosin (FLOMAX) 0.4 MG CAPS capsule TAKE 2 CAPSULES BY MOUTH ONCE DAILY WITH  LUNCH 03/25/20  Yes McGowan, Larene [place] A, PA-C  triamcinolone cream (KENALOG) 0.1 % Apply 0.1 application topically 1 day or 1 dose.   Yes [provider]  TRULICITY 1.5 XN/2.3FT SOPN Inject into the skin once a week.   Yes [provider]  venlafaxine XR (EFFEXOR-XR) 150 MG 24 hr capsule TAKE 1 CAPSULE BY MOUTH ONCE DAILY WITH BREAKFAST 02/14/20  Yes Jerrol Banana., MD  Na Sulfate-K Sulfate-Mg Sulf 17.5-3.13-1.6 GM/177ML SOLN At 5 PM the day before procedure take 1 bottle and 5 hours before procedure take 1 bottle. 05/27/20   Virgel Manifold, MD    Family History  Problem Relation Age of Onset  . Hypertension Mother   . Heart disease Mother      Social History   Tobacco Use  . Smoking status: Former Smoker    Quit date: 04/03/1988    Years since quitting: 32.1  . Smokeless tobacco: Never Used  Vaping Use  . Vaping Use: Never used  Substance Use Topics  . Alcohol use: Not Currently    Comment: Ocassional alcohol use, Drinks wine.  . Drug  use: Never    Allergies as of 05/27/2020 - Review Complete 05/27/2020  Allergen Reaction Noted  . Zyrtec allergy [cetirizine] Diarrhea 05/20/2015    Review of Systems:    All systems reviewed and negative except where noted in HPI.   Physical Exam:  BP 122/80   Pulse 85   Temp 98.3 F (36.8 C) (Oral)   Ht 5\' 9"  (1.753 m)   Wt 222 lb 6.4 oz (100.9 kg)   BMI 32.84 kg/m  No LMP for male patient. Psych:  Alert and cooperative. Normal mood and affect. General:   Alert,  Well-developed, well-nourished, pleasant and cooperative in NAD Head:  Normocephalic and atraumatic. Eyes:  Sclera clear, no icterus.   Conjunctiva  pink. Ears:  Normal auditory acuity. Nose:  No deformity, discharge, or lesions. Mouth:  No deformity or lesions,oropharynx pink & moist. Neck:  Supple; no masses or thyromegaly. Abdomen:  Normal bowel sounds.  No bruits.  Soft, non-tender and non-distended without masses, hepatosplenomegaly or hernias noted.  No guarding or rebound tenderness.    Msk:  Symmetrical without gross deformities. Good, equal movement & strength bilaterally. Pulses:  Normal pulses noted. Extremities:  No clubbing or edema.  No cyanosis. Neurologic:  Alert and oriented x3;  grossly normal neurologically. Skin:  Intact without significant lesions or rashes. No jaundice. Lymph Nodes:  No significant cervical adenopathy. Psych:  Alert and cooperative. Normal mood and affect.   Labs: CBC    Component Value Date/Time   WBC 5.8 02/12/2020 1113   WBC 6.8 12/18/2015 1336   RBC 4.52 02/12/2020 1113   RBC 4.87 12/18/2015 1336   HGB 14.1 02/12/2020 1113   HCT 42.2 02/12/2020 1113   PLT 160 02/12/2020 1113   MCV 93 02/12/2020 1113   MCV 89 01/01/2014 1503   MCH 31.2 02/12/2020 1113   MCH 29.9 12/18/2015 1336   MCHC 33.4 02/12/2020 1113   MCHC 33.3 12/18/2015 1336   RDW 14.3 02/12/2020 1113   RDW 13.3 01/01/2014 1503   LYMPHSABS 1.6 02/12/2020 1113   EOSABS 0.3 02/12/2020 1113   BASOSABS  0.0 02/12/2020 1113   CMP     Component Value Date/Time   NA 145 (H) 02/26/2020 1102   NA 135 (L) 01/01/2014 1503   K 5.1 02/26/2020 1102   K 3.0 (L) 01/01/2014 1503   CL 106 02/26/2020 1102   CL 99 01/01/2014 1503   CO2 23 02/26/2020 1102   CO2 23 01/01/2014 1503   GLUCOSE 156 (H) 02/26/2020 1102   GLUCOSE 224 (H) 05/01/2018 0954   GLUCOSE 235 (H) 01/01/2014 1503   BUN 22 02/26/2020 1102   BUN 25 (H) 01/01/2014 1503   CREATININE 1.52 (H) 02/26/2020 1102   CREATININE 1.47 (H) 01/01/2014 1503   CALCIUM 9.4 02/26/2020 1102   CALCIUM 8.9 01/01/2014 1503   PROT 6.9 02/12/2020 1113   ALBUMIN 4.4 02/26/2020 1102   AST 27 02/12/2020 1113   ALT 32 02/12/2020 1113   ALKPHOS 107 02/12/2020 1113   BILITOT 0.5 02/12/2020 1113   GFRNONAA 45 (L) 02/26/2020 1102   GFRNONAA 49 (L) 01/01/2014 1503   GFRAA 52 (L) 02/26/2020 1102   GFRAA 57 (L) 01/01/2014 1503    Imaging Studies: No results found.  Assessment and Plan:   JENNIE BOLAR is a 74 y.o. y/o male has been referred for postprandial nausea  Symptoms may be related to his diabetes However, given early satiety, new heartburn despite PPI, intermittent dysphagia, upper endoscopy to rule out any obstructive lesion  Also due for screening colonoscopy  I have discussed alternative options, risks & benefits,  which include, but are not limited to, bleeding, infection, perforation,respiratory complication & drug reaction.  The patient agrees with this plan & written consent will be obtained.    Continue follow-up with PCP and good blood sugar control    Dr Tom Moore  Speech recognition software was used to dictate the above note.

## 2020-05-30 ENCOUNTER — Other Ambulatory Visit: Payer: Self-pay

## 2020-05-30 ENCOUNTER — Other Ambulatory Visit
Admission: RE | Admit: 2020-05-30 | Discharge: 2020-05-30 | Disposition: A | Discharge status: Home / Self Care | Payer: Medicare Other | Source: Ambulatory Visit | Attending: Gastroenterology | Admitting: Gastroenterology

## 2020-05-30 DIAGNOSIS — Z01812 Encounter for preprocedural laboratory examination: Secondary | ICD-10-CM | POA: Insufficient documentation

## 2020-05-30 DIAGNOSIS — Z20822 Contact with and (suspected) exposure to covid-19: Secondary | ICD-10-CM | POA: Insufficient documentation

## 2020-05-30 LAB — SARS CORONAVIRUS 2 (TAT 6-24 HRS): SARS Coronavirus 2: NEGATIVE

## 2020-06-04 ENCOUNTER — Encounter
Admission: RE | Disposition: A | Discharge status: Home / Self Care | Payer: Self-pay | Source: Home / Self Care | Attending: Gastroenterology

## 2020-06-04 ENCOUNTER — Ambulatory Visit
Admission: RE | Admit: 2020-06-04 | Discharge: 2020-06-04 | Disposition: A | Discharge status: Home / Self Care | Payer: Medicare Other | Attending: Gastroenterology | Admitting: Gastroenterology

## 2020-06-04 ENCOUNTER — Ambulatory Visit: Payer: Medicare Other | Admitting: Anesthesiology

## 2020-06-04 ENCOUNTER — Encounter: Payer: Self-pay | Admitting: Gastroenterology

## 2020-06-04 DIAGNOSIS — Z1211 Encounter for screening for malignant neoplasm of colon: Secondary | ICD-10-CM | POA: Diagnosis not present

## 2020-06-04 DIAGNOSIS — K635 Polyp of colon: Secondary | ICD-10-CM | POA: Diagnosis not present

## 2020-06-04 DIAGNOSIS — Z888 Allergy status to other drugs, medicaments and biological substances status: Secondary | ICD-10-CM | POA: Diagnosis not present

## 2020-06-04 DIAGNOSIS — F329 Major depressive disorder, single episode, unspecified: Secondary | ICD-10-CM | POA: Insufficient documentation

## 2020-06-04 DIAGNOSIS — F419 Anxiety disorder, unspecified: Secondary | ICD-10-CM | POA: Diagnosis not present

## 2020-06-04 DIAGNOSIS — D126 Benign neoplasm of colon, unspecified: Secondary | ICD-10-CM | POA: Diagnosis not present

## 2020-06-04 DIAGNOSIS — R112 Nausea with vomiting, unspecified: Secondary | ICD-10-CM | POA: Diagnosis present

## 2020-06-04 DIAGNOSIS — E0590 Thyrotoxicosis, unspecified without thyrotoxic crisis or storm: Secondary | ICD-10-CM | POA: Diagnosis not present

## 2020-06-04 DIAGNOSIS — I130 Hypertensive heart and chronic kidney disease with heart failure and stage 1 through stage 4 chronic kidney disease, or unspecified chronic kidney disease: Secondary | ICD-10-CM | POA: Insufficient documentation

## 2020-06-04 DIAGNOSIS — Z87891 Personal history of nicotine dependence: Secondary | ICD-10-CM | POA: Diagnosis not present

## 2020-06-04 DIAGNOSIS — R6881 Early satiety: Secondary | ICD-10-CM | POA: Diagnosis not present

## 2020-06-04 DIAGNOSIS — I4891 Unspecified atrial fibrillation: Secondary | ICD-10-CM | POA: Insufficient documentation

## 2020-06-04 DIAGNOSIS — M1990 Unspecified osteoarthritis, unspecified site: Secondary | ICD-10-CM | POA: Insufficient documentation

## 2020-06-04 DIAGNOSIS — E1140 Type 2 diabetes mellitus with diabetic neuropathy, unspecified: Secondary | ICD-10-CM | POA: Diagnosis not present

## 2020-06-04 DIAGNOSIS — Z794 Long term (current) use of insulin: Secondary | ICD-10-CM | POA: Insufficient documentation

## 2020-06-04 DIAGNOSIS — E1122 Type 2 diabetes mellitus with diabetic chronic kidney disease: Secondary | ICD-10-CM | POA: Diagnosis not present

## 2020-06-04 DIAGNOSIS — N183 Chronic kidney disease, stage 3 unspecified: Secondary | ICD-10-CM | POA: Diagnosis not present

## 2020-06-04 DIAGNOSIS — K219 Gastro-esophageal reflux disease without esophagitis: Secondary | ICD-10-CM | POA: Diagnosis not present

## 2020-06-04 DIAGNOSIS — Z79899 Other long term (current) drug therapy: Secondary | ICD-10-CM | POA: Diagnosis not present

## 2020-06-04 DIAGNOSIS — R1110 Vomiting, unspecified: Secondary | ICD-10-CM

## 2020-06-04 DIAGNOSIS — G4730 Sleep apnea, unspecified: Secondary | ICD-10-CM | POA: Diagnosis not present

## 2020-06-04 DIAGNOSIS — E785 Hyperlipidemia, unspecified: Secondary | ICD-10-CM | POA: Diagnosis not present

## 2020-06-04 DIAGNOSIS — I890 Lymphedema, not elsewhere classified: Secondary | ICD-10-CM | POA: Insufficient documentation

## 2020-06-04 DIAGNOSIS — R1310 Dysphagia, unspecified: Secondary | ICD-10-CM

## 2020-06-04 DIAGNOSIS — I509 Heart failure, unspecified: Secondary | ICD-10-CM | POA: Diagnosis not present

## 2020-06-04 HISTORY — PX: ESOPHAGOGASTRODUODENOSCOPY (EGD) WITH PROPOFOL: SHX5813

## 2020-06-04 HISTORY — PX: COLONOSCOPY WITH PROPOFOL: SHX5780

## 2020-06-04 LAB — GLUCOSE, CAPILLARY: Glucose-Capillary: 126 mg/dL — ABNORMAL HIGH (ref 70–99)

## 2020-06-04 SURGERY — COLONOSCOPY WITH PROPOFOL
Anesthesia: General

## 2020-06-04 MED ORDER — PROPOFOL 10 MG/ML IV BOLUS
INTRAVENOUS | Status: DC | PRN
  Administered 2020-06-04: 50 mg via INTRAVENOUS
  Administered 2020-06-04: 80 mg via INTRAVENOUS

## 2020-06-04 MED ORDER — METOPROLOL TARTRATE 5 MG/5ML IV SOLN
INTRAVENOUS | Status: DC | PRN
  Administered 2020-06-04 (×2): 2 mg via INTRAVENOUS

## 2020-06-04 MED ORDER — SODIUM CHLORIDE 0.9 % IV SOLN: INTRAVENOUS | Status: DC

## 2020-06-04 MED ORDER — PROPOFOL 500 MG/50ML IV EMUL
INTRAVENOUS | Status: DC | PRN
  Administered 2020-06-04: 150 ug/kg/min via INTRAVENOUS

## 2020-06-04 MED ORDER — LIDOCAINE HCL (CARDIAC) PF 100 MG/5ML IV SOSY
PREFILLED_SYRINGE | INTRAVENOUS | Status: DC | PRN
  Administered 2020-06-04: 50 mg via INTRAVENOUS

## 2020-06-04 MED ORDER — METOPROLOL TARTRATE 5 MG/5ML IV SOLN
INTRAVENOUS | Status: AC
  Filled 2020-06-04: qty 5

## 2020-06-04 MED ORDER — PROPOFOL 10 MG/ML IV BOLUS
INTRAVENOUS | Status: AC
  Filled 2020-06-04: qty 20

## 2020-06-04 NOTE — Transfer of Care (Signed)
Immediate Anesthesia Transfer of Care Note  Patient: Tom Moore  Procedure(s) Performed: COLONOSCOPY WITH PROPOFOL (N/A ) ESOPHAGOGASTRODUODENOSCOPY (EGD) WITH PROPOFOL (N/A )  Patient Location: Endoscopy Unit  Anesthesia Type:General  Level of Consciousness: drowsy and patient cooperative  Airway & Oxygen Therapy: Patient Spontanous Breathing  Post-op Assessment: Report given to RN and Post -op Vital signs reviewed and stable  Post vital signs: Reviewed and stable  Last Vitals:  Vitals Value Taken Time  BP 120/81 06/04/20 1103  Temp 36.3 C 06/04/20 1103  Pulse 66 06/04/20 1104  Resp 17 06/04/20 1104  SpO2 92 % 06/04/20 1104  Vitals shown include unvalidated device data.  Last Pain:  Vitals:   06/04/20 1103  TempSrc: Tympanic  PainSc: Asleep         Complications: No complications documented.

## 2020-06-04 NOTE — Op Note (Signed)
[facility] Patient Name: Tom Moore Procedure Date: 06/04/2020 9:21 AM MRN: [id number] Account #: [id number] Date of Birth: [birth year] Admit Type: Outpatient Age: 74 Room: [facility] ENDO ROOM 2 Gender: Male Note Status: Finalized Procedure:             Upper GI endoscopy Indications:           Nausea with vomiting Providers:             Eshal Propps B. Bonna Gains MD, MD Referring MD:          Janine Ores. Rosanna Randy, MD (Referring MD) Medicines:             Monitored Anesthesia Care Complications:         No immediate complications. Procedure:             Pre-Anesthesia Assessment:                        - Prior to the procedure, a History and Physical was                         performed, and patient medications, allergies and                         sensitivities were reviewed. The patient's tolerance                         of previous anesthesia was reviewed.                        - The risks and benefits of the procedure and the                         sedation options and risks were discussed with the                         patient. All questions were answered and informed                         consent was obtained.                        - Patient identification and proposed procedure were                         verified prior to the procedure by the physician, the                         nurse, the anesthesiologist, the anesthetist and the                         technician. The procedure was verified in the                         procedure room.                        - ASA Grade Assessment: II - A patient with mild                         systemic  disease.                        After obtaining informed consent, the endoscope was                         passed under direct vision. Throughout the procedure,                         the patient's blood pressure, pulse, and oxygen                         saturations were monitored continuously.  The Endoscope                         was introduced through the mouth, and advanced to the                         second part of duodenum. The upper GI endoscopy was                         accomplished with ease. The patient tolerated the                         procedure well. Findings:      The examined esophagus was normal.      The entire examined stomach was normal. Biopsies were obtained in the       gastric body, at the incisura and in the gastric antrum with cold       forceps for histology. Biopsies were taken with a cold forceps for       Helicobacter pylori testing.      Lymphangiectasia was present in the second portion of the duodenum.      The duodenal bulb, second portion of the duodenum and examined duodenum       were normal. Impression:            - Normal esophagus.                        - Normal stomach. Biopsied.                        - Duodenal mucosal lymphangiectasia.                        - Normal duodenal bulb, second portion of the duodenum                         and examined duodenum.                        - Biopsies were obtained in the gastric body, at the                         incisura and in the gastric antrum. Recommendation:        - Await pathology results.                        - Discharge patient to home (with escort).                        -  Advance diet as tolerated.                        - Continue present medications.                        - Patient has a contact number available for                         emergencies. The signs and symptoms of potential                         delayed complications were discussed with the patient.                         Return to normal activities tomorrow. Written                         discharge instructions were provided to the patient.                        - Discharge patient to home (with escort).                        - The findings and recommendations were discussed with                          the patient.                        - The findings and recommendations were discussed with                         the patient's family. Procedure Code(s):     --- Professional ---                        [phone], Esophagogastroduodenoscopy, flexible,                         transoral; with biopsy, single or multiple Diagnosis Code(s):     --- Professional ---                        I89.0, Lymphedema, not elsewhere classified                        R11.2, Nausea with vomiting, unspecified CPT copyright 2019 American Medical Association. All rights reserved. The codes documented in this report are preliminary and upon coder review may  be revised to meet current compliance requirements.  Vonda Antigua, MD Margretta Sidle B. Bonna Gains MD, MD 06/04/2020 10:12:41 AM This report has been signed electronically. Number of Addenda: 0 Note Initiated On: 06/04/2020 9:21 AM Estimated Blood Loss:  Estimated blood loss: none.      [facility]

## 2020-06-04 NOTE — H&P (Signed)
Tom Antigua, MD [address], [place], [place], [place], [address] [address], [place], [place], [place], [address] Phone: [phone]  Fax: [phone]  Primary Care Physician:  Jerrol Banana., MD   Pre-Procedure History & Physical: HPI:  Tom Moore is a 74 y.o. male is here for a colonoscopy and EGD.   Past Medical History:  Diagnosis Date  . Anxiety   . Arthritis   . CHF (congestive heart failure) ([place])   . Depression   . Diabetes mellitus without complication ([place])   . Dysrhythmia    atrial fib  . GERD (gastroesophageal reflux disease)   . Hyperlipidemia   . Hypertension   . Hyperthyroidism   . Insomnia   . Neuromuscular disorder ([place])    diabetic neuropathy  . Orthopnea    uses extra pillow to sleep  . Sleep apnea    unable to tolerater CPAP    Past Surgical History:  Procedure Laterality Date  . CARDIAC CATHETERIZATION    . CARPAL TUNNEL RELEASE Right 04/10/2018   Procedure: RELEASE MEDIAN NERVE AT CARPAL TUNNEL;  Surgeon: Earnestine Leys, MD;  Location: [facility] ORS;  Service: Orthopedics;  Laterality: Right;  . CARPAL TUNNEL RELEASE Left 05/08/2018   Procedure: CARPAL TUNNEL RELEASE;  Surgeon: Earnestine Leys, MD;  Location: [facility] ORS;  Service: Orthopedics;  Laterality: Left;  . CATARACT EXTRACTION W/PHACO Right 08/16/2019   Procedure: CATARACT EXTRACTION PHACO AND INTRAOCULAR LENS PLACEMENT ([place]) RIGHT;  Surgeon: Marchia Meiers, MD;  Location: [facility] ORS;  Service: Ophthalmology;  Laterality: Right;  [place] 01:30.3 CDE 13.71 Fluid Pack lot # [id number]  . TONSILLECTOMY      Prior to Admission medications   Medication Sig Start Date End Date Taking? Authorizing Provider  atorvastatin (LIPITOR) 40 MG tablet Take 40 mg by mouth daily.   Yes [provider]  buPROPion [facility]) 75 MG tablet Take 1 tablet by mouth twice daily 05/22/20  Yes Jerrol Banana., MD  diltiazem (CARDIZEM CD) 120 MG 24 hr capsule Take 1 capsule (120 mg  total) by mouth daily. 08/27/19 07/04/22 Yes Jerrol Banana., MD  dutasteride (AVODART) 0.5 MG capsule Take 1 capsule (0.5 mg total) by mouth daily. 04/10/20  Yes Jerrol Banana., MD  furosemide (LASIX) 40 MG tablet Take 1 tablet (40 mg total) by mouth 2 (two) times daily. Patient taking differently: Take 40 mg by mouth daily.  06/19/18  Yes Mar Daring, PA-C  gabapentin (NEURONTIN) 400 MG capsule Take 400 mg by mouth daily. AT LUNCH    Yes [provider]  glipiZIDE (GLUCOTROL) 10 MG tablet TAKE ONE TABLET BY MOUTH THREE TIMES DAILY BEFORE MEALS Patient taking differently: 10 mg 2 (two) times daily before a meal.  06/19/18  Yes Burnette, Jennifer M, PA-C  insulin NPH Human (HUMULIN N,NOVOLIN N) 100 UNIT/ML injection 28 Units 2 (two) times daily before a meal.  11/01/16  Yes [provider]  metFORMIN (GLUCOPHAGE) 1000 MG tablet TAKE 1 TABLET BY MOUTH TWICE DAILY WITH A MEAL Patient taking differently: daily with breakfast.  04/27/16  Yes Carmon Ginsberg, PA  methimazole (TAPAZOLE) 5 MG tablet Take 5 mg by mouth. 02/15/17  Yes [provider]  metoprolol succinate (TOPROL-XL) 100 MG 24 hr tablet Take 100 mg by mouth daily.  07/19/17  Yes [provider]  multivitamin-iron-minerals-folic acid (THERAPEUTIC-M) TABS tablet Take 1 tablet by mouth daily.   Yes [provider]  Na Sulfate-K Sulfate-Mg Sulf 17.5-3.13-1.6 GM/177ML SOLN  At 5 PM the day before procedure take 1 bottle and 5 hours before procedure take 1 bottle. 05/27/20  Yes Virgel Manifold, MD  omeprazole (PRILOSEC) 40 MG capsule Take 1 capsule by mouth once daily 04/29/20  Yes Jerrol Banana., MD  OZEMPIC, 0.25 OR 0.5 MG/DOSE, 2 MG/1.5ML SOPN 1.5 mLs once a week. 12/15/19  Yes [provider]  sertraline (ZOLOFT) 100 MG tablet Take 1 tablet by mouth once daily 05/22/20  Yes Jerrol Banana., MD  spironolactone (ALDACTONE) 25 MG tablet Take 25 mg by mouth daily.  06/25/19  Yes [provider]  sucralfate (CARAFATE) 1 g tablet Take 1 tablet (1 g total) by mouth 3 (three) times daily with meals. 02/12/20  Yes Jerrol Banana., MD  tamsulosin (FLOMAX) 0.4 MG CAPS capsule TAKE 2 CAPSULES BY MOUTH ONCE DAILY WITH  LUNCH 03/25/20  Yes McGowan, Shannon A, PA-C  TRULICITY 1.5 AJ/2.8NO SOPN Inject into the skin once a week.   Yes [provider]  venlafaxine XR (EFFEXOR-XR) 150 MG 24 hr capsule TAKE 1 CAPSULE BY MOUTH ONCE DAILY WITH BREAKFAST 02/14/20  Yes Jerrol Banana., MD  glucose blood (ONE TOUCH ULTRA TEST) test strip Use 3 (three) times daily One touch ultra test strips e11.29 06/02/18   [provider]  triamcinolone cream (KENALOG) 0.1 % Apply 0.1 application topically 1 day or 1 dose.    [provider]    Allergies as of 05/27/2020 - Review Complete 05/27/2020  Allergen Reaction Noted  . Zyrtec allergy [cetirizine] Diarrhea 05/20/2015    Family History  Problem Relation Age of Onset  . Hypertension Mother   . Heart disease Mother     Social History   Socioeconomic History  . Marital status: Divorced    Spouse name: Not on file  . Number of children: Not on file  . Years of education: Not on file  . Highest education level: Not on file  Occupational History  . Not on file  Tobacco Use  . Smoking status: Former Smoker    Quit date: 04/03/1988    Years since quitting: 32.1  . Smokeless tobacco: Never Used  Vaping Use  . Vaping Use: Never used  Substance and Sexual Activity  . Alcohol use: Not Currently    Comment: Ocassional alcohol use, Drinks wine.  . Drug use: Never  . Sexual activity: Not Currently  Other Topics Concern  . Not on file  Social History Narrative  . Not on file   Social Determinants of Health   Financial Resource Strain:   . Difficulty of Paying Living Expenses: Not on file  Food Insecurity:   . Worried About Charity fundraiser in the Last Year: Not on file  . Ran  Out of Food in the Last Year: Not on file  Transportation Needs:   . Lack of Transportation (Medical): Not on file  . Lack of Transportation (Non-Medical): Not on file  Physical Activity:   . Days of Exercise per Week: Not on file  . Minutes of Exercise per Session: Not on file  Stress:   . Feeling of Stress : Not on file  Social Connections:   . Frequency of Communication with Friends and Family: Not on file  . Frequency of Social Gatherings with Friends and Family: Not on file  . Attends Religious Services: Not on file  . Active Member of Clubs or Organizations: Not on file  . Attends Archivist  Meetings: Not on file  . Marital Status: Not on file  Intimate Partner Violence:   . Fear of Current or Ex-Partner: Not on file  . Emotionally Abused: Not on file  . Physically Abused: Not on file  . Sexually Abused: Not on file    Review of Systems: See HPI, otherwise negative ROS  Physical Exam: BP 122/77   Pulse (!) 114   Temp 97.7 F (36.5 C) (Temporal)   Resp 18   Ht 5\' 9"  (1.753 m)   Wt 100.9 kg   SpO2 100%   BMI 32.85 kg/m  General:   Alert,  pleasant and cooperative in NAD Head:  Normocephalic and atraumatic. Neck:  Supple; no masses or thyromegaly. Lungs:  Clear throughout to auscultation, normal respiratory effort.    Heart:  +S1, +S2, Regular rate and rhythm, No edema. Abdomen:  Soft, nontender and nondistended. Normal bowel sounds, without guarding, and without rebound.   Neurologic:  Alert and  oriented x4;  grossly normal neurologically.  Impression/Plan: KEYWON MESTRE is here for a colonoscopy to be performed for average risk screening and EGD for nausea  Risks, benefits, limitations, and alternatives regarding the procedures have been reviewed with the patient.  Questions have been answered.  All parties agreeable.   Virgel Manifold, MD  06/04/2020, 9:14 AM

## 2020-06-04 NOTE — Anesthesia Postprocedure Evaluation (Signed)
Anesthesia Post Note  Patient: ZALMEN WRIGHTSMAN  Procedure(s) Performed: COLONOSCOPY WITH PROPOFOL (N/A ) ESOPHAGOGASTRODUODENOSCOPY (EGD) WITH PROPOFOL (N/A )  Patient location during evaluation: Endoscopy Anesthesia Type: General Level of consciousness: awake and alert Pain management: pain level controlled Vital Signs Assessment: post-procedure vital signs reviewed and stable Respiratory status: spontaneous breathing and respiratory function stable Cardiovascular status: stable Anesthetic complications: no   No complications documented.   Last Vitals:  Vitals:   06/04/20 1103 06/04/20 1113  BP: 120/81 127/74  Pulse: 67 64  Resp: 11 19  Temp: (!) 36.3 C   SpO2: 97% 99%    Last Pain:  Vitals:   06/04/20 1113  TempSrc:   PainSc: 0-No pain                 Keithan Dileonardo K

## 2020-06-04 NOTE — Anesthesia Preprocedure Evaluation (Addendum)
Anesthesia Evaluation  Patient identified by MRN, date of birth, ID band Patient awake    Reviewed: Allergy & Precautions, NPO status , Patient's Chart, lab work & pertinent test results  Airway Mallampati: III       Dental  (+) Missing, Poor Dentition, Chipped   Pulmonary sleep apnea , neg COPD, former smoker,           Cardiovascular hypertension, Pt. on medications +CHF (LVEF 20-25%)  + dysrhythmias Atrial Fibrillation      Neuro/Psych neg Seizures Anxiety Depression    GI/Hepatic Neg liver ROS, GERD  Medicated and Controlled,  Endo/Other  diabetes, Type 2, Oral Hypoglycemic AgentsHyperthyroidism   Renal/GU Renal InsufficiencyRenal disease     Musculoskeletal   Abdominal   Peds  Hematology   Anesthesia Other Findings   Reproductive/Obstetrics                            Anesthesia Physical Anesthesia Plan  ASA: III  Anesthesia Plan: General   Post-op Pain Management:    Induction: Intravenous  PONV Risk Score and Plan: Propofol infusion, TIVA and Treatment may vary due to age or medical condition  Airway Management Planned: Nasal Cannula  Additional Equipment:   Intra-op Plan:   Post-operative Plan:   Informed Consent: I have reviewed the patients History and Physical, chart, labs and discussed the procedure including the risks, benefits and alternatives for the proposed anesthesia with the patient or authorized representative who has indicated his/her understanding and acceptance.       Plan Discussed with:   Anesthesia Plan Comments: (Spoke with Dr. Clayborn Bigness, cardiologist. He states he just saw the pt yesterday and there is no reason to hold off on the procedure today. )       Anesthesia Quick Evaluation

## 2020-06-04 NOTE — H&P (Addendum)
I spoke to Delicia white, NP this morning, Dr. Etta Quill NP. She also discussed this pt with Dr. Clayborn Bigness. They both saw the patient yesterday and state that the pt is in and out of A-Fib and the A-Fib is not a new finding for him. They are not planning on cardiac catheterization for this patient.  They are aware of the recent stress test results.  Dr. Clayborn Bigness and Delcia white, recommend proceeding with his elective procedures today.  They state they will send a clearance note to our office today.  Dr. Ronelle Nigh, of anesthesia is aware and is okay with proceeding with the procedure based on this.  As per Dr. Vida Rigger message  "No need to postpone him procedure. At this point his adequately compensated. I recommend proceeding with the case. Thanks"

## 2020-06-04 NOTE — Op Note (Signed)
[facility] Patient Name: Tom Moore Procedure Date: 06/04/2020 9:20 AM MRN: [id number] Account #: [id number] Date of Birth: 13-Jun-1946 Admit Type: Outpatient Age: 74 Room: [facility] ENDO ROOM 2 Gender: Male Note Status: Finalized Procedure:             Colonoscopy Indications:           Screening for colorectal malignant neoplasm Providers:             Joshlynn Alfonzo B. Bonna Gains MD, MD Referring MD:          Janine Ores. Rosanna Randy, MD (Referring MD) Medicines:             Monitored Anesthesia Care Complications:         No immediate complications. Procedure:             Pre-Anesthesia Assessment:                        - ASA Grade Assessment: II - A patient with mild                         systemic disease.                        - Prior to the procedure, a History and Physical was                         performed, and patient medications, allergies and                         sensitivities were reviewed. The patient's tolerance                         of previous anesthesia was reviewed.                        - The risks and benefits of the procedure and the                         sedation options and risks were discussed with the                         patient. All questions were answered and informed                         consent was obtained.                        - Patient identification and proposed procedure were                         verified prior to the procedure by the physician, the                         nurse, the anesthesiologist, the anesthetist and the                         technician. The procedure was verified in the                         procedure  room.                        After obtaining informed consent, the colonoscope was                         passed under direct vision. Throughout the procedure,                         the patient's blood pressure, pulse, and oxygen                         saturations were  monitored continuously. The                         Colonoscope was introduced through the anus and                         advanced to the the cecum, identified by appendiceal                         orifice and ileocecal valve. The colonoscopy was                         performed with ease. The patient tolerated the                         procedure well. The quality of the bowel preparation                         was good except the sigmoid colon was fair. Findings:      The perianal and digital rectal examinations were normal.      Two flat and sessile polyps were found in the sigmoid colon. The polyps       were 5 to 7 mm in size. These polyps were removed with a cold snare.       Resection and retrieval were complete.      Two flat and sessile polyps were found in the sigmoid colon and       transverse colon. The polyps were 4 to 6 mm in size. These polyps were       removed with a jumbo cold forceps. Resection and retrieval were complete.      The exam was otherwise without abnormality.      The rectum, sigmoid colon, descending colon, transverse colon, ascending       colon and cecum appeared normal.      The retroflexed view of the distal rectum and anal verge was normal and       showed no anal or rectal abnormalities. Impression:            - Two 5 to 7 mm polyps in the sigmoid colon, removed                         with a cold snare. Resected and retrieved.                        - Two 4 to 6 mm polyps in the sigmoid colon and in the  transverse colon, removed with a jumbo cold forceps.                         Resected and retrieved.                        - The examination was otherwise normal.                        - The rectum, sigmoid colon, descending colon,                         transverse colon, ascending colon and cecum are normal.                        - The distal rectum and anal verge are normal on                         retroflexion  view. Recommendation:        - Discharge patient to home (with escort).                        - Advance diet as tolerated.                        - Continue present medications.                        - Await pathology results.                        - Repeat colonoscopy date to be determined after                         pending pathology results are reviewed.                        - The findings and recommendations were discussed with                         the patient.                        - The findings and recommendations were discussed with                         the patient's family.                        - Return to primary care physician as previously                         scheduled. Procedure Code(s):     --- Professional ---                        [phone], Colonoscopy, flexible; with removal of                         tumor(s), polyp(s), or other lesion(s) by snare  technique                        45380, 59, Colonoscopy, flexible; with biopsy, single                         or multiple Diagnosis Code(s):     --- Professional ---                        Z12.11, Encounter for screening for malignant neoplasm                         of colon                        K63.5, Polyp of colon CPT copyright 2019 American Medical Association. All rights reserved. The codes documented in this report are preliminary and upon coder review may  be revised to meet current compliance requirements.  Vonda Antigua, MD Margretta Sidle B. Bonna Gains MD, MD 06/04/2020 11:02:41 AM This report has been signed electronically. Number of Addenda: 0 Note Initiated On: 06/04/2020 9:20 AM Scope Withdrawal Time: 0 hours 23 minutes 11 seconds  Total Procedure Duration: 0 hours 39 minutes 53 seconds  Estimated Blood Loss:  Estimated blood loss: none.      [facility]

## 2020-06-05 ENCOUNTER — Encounter: Payer: Self-pay | Admitting: Gastroenterology

## 2020-06-05 LAB — SURGICAL PATHOLOGY

## 2020-06-07 ENCOUNTER — Encounter: Payer: Self-pay | Admitting: Gastroenterology

## 2020-06-11 NOTE — Progress Notes (Signed)
Established patient visit   Patient: Tom Moore   DOB: 06/12/1946   74 y.o. Male  MRN: [id number] Visit Date: 06/12/2020  Today's healthcare provider: [name], MD   Chief Complaint  Patient presents with  . Depression  . Anxiety   Subjective    HPI   Patient would like to know results from colonoscopy. Patient has also been having some episodes of syncope, that started 2 days ago. He has had 4 episodes this year and had 3 last year.   Patient says that when he passed out a few days ago he was feeling dizzy and jittery.  He is accompanied by his daughter today who states he had one syncopal episode last week.  Patient nor the daughter states that in my is never checked his sugar when these episodes occur.  He does not remember falling or being injured during these episodes.  He feels weak and jittery as noted above.  Anxiety, Follow-up  He was last seen for anxiety 2 months ago. Changes made at last visit include no changes.   He reports excellent compliance with treatment. He reports good tolerance of treatment. He is not having side effects.   He feels his anxiety is moderate and Unchanged since last visit.   GAD-7 Results No flowsheet data found.  PHQ-9 Scores PHQ9 SCORE ONLY 07/05/2019 02/09/2019 01/31/2019  PHQ-9 Total Score 17 1 11     Dizziness- 04/10/20 This appears to be vertigo.  Try meclizine 25 mg 3 times daily as needed.  May need ENT or neurology referral.  Other than nystagmus exam is otherwise nonfocal.  Major depressive disorder with single episode, in partial remission ([place]) PHQ-9 on next visit ---------------------------------------------------------------------------------------------------  Social History   Tobacco Use  . Smoking status: Former Smoker    Quit date: 04/03/1988    Years since quitting: 32.2  . Smokeless tobacco: Never Used  Vaping Use  . Vaping Use: Never used  Substance Use Topics  . Alcohol use: Not  Currently    Comment: Ocassional alcohol use, Drinks wine.  . Drug use: Never       Medications: Outpatient Medications Prior to Visit  Medication Sig  . atorvastatin (LIPITOR) 40 MG tablet Take 40 mg by mouth daily.  Marland Kitchen buPROPion (WELLBUTRIN) 75 MG tablet Take 1 tablet by mouth twice daily  . diltiazem (CARDIZEM CD) 120 MG 24 hr capsule Take 1 capsule (120 mg total) by mouth daily.  Marland Kitchen dutasteride (AVODART) 0.5 MG capsule Take 1 capsule (0.5 mg total) by mouth daily.  . furosemide (LASIX) 40 MG tablet Take 1 tablet (40 mg total) by mouth 2 (two) times daily. (Patient taking differently: Take 40 mg by mouth daily. )  . gabapentin (NEURONTIN) 400 MG capsule Take 400 mg by mouth daily. AT LUNCH   . glipiZIDE (GLUCOTROL) 10 MG tablet TAKE ONE TABLET BY MOUTH THREE TIMES DAILY BEFORE MEALS (Patient taking differently: 10 mg 2 (two) times daily before a meal. )  . glucose blood (ONE TOUCH ULTRA TEST) test strip Use 3 (three) times daily One touch ultra test strips e11.29  . insulin NPH Human (HUMULIN N,NOVOLIN N) 100 UNIT/ML injection 28 Units 2 (two) times daily before a meal.   . metFORMIN (GLUCOPHAGE) 1000 MG tablet TAKE 1 TABLET BY MOUTH TWICE DAILY WITH A MEAL (Patient taking differently: daily with breakfast. )  . methimazole (TAPAZOLE) 5 MG tablet Take 5 mg by mouth.  . metoprolol succinate (TOPROL-XL) 100 MG 24  hr tablet Take 100 mg by mouth daily.   . multivitamin-iron-minerals-folic acid (THERAPEUTIC-M) TABS tablet Take 1 tablet by mouth daily.  . Na Sulfate-K Sulfate-Mg Sulf 17.5-3.13-1.6 GM/177ML SOLN At 5 PM the day before procedure take 1 bottle and 5 hours before procedure take 1 bottle.  Marland Kitchen omeprazole (PRILOSEC) 40 MG capsule Take 1 capsule by mouth once daily  . OZEMPIC, 0.25 OR 0.5 MG/DOSE, 2 MG/1.5ML SOPN 1.5 mLs once a week.  . sacubitril-valsartan (ENTRESTO) 24-26 MG Take by mouth.  . sertraline (ZOLOFT) 100 MG tablet Take 1 tablet by mouth once daily  . sucralfate (CARAFATE)  1 g tablet Take 1 tablet (1 g total) by mouth 3 (three) times daily with meals.  . tamsulosin (FLOMAX) 0.4 MG CAPS capsule TAKE 2 CAPSULES BY MOUTH ONCE DAILY WITH  LUNCH  . TRULICITY 1.5 RS/8.5IO SOPN Inject into the skin once a week.  . venlafaxine XR (EFFEXOR-XR) 150 MG 24 hr capsule TAKE 1 CAPSULE BY MOUTH ONCE DAILY WITH BREAKFAST  . spironolactone (ALDACTONE) 25 MG tablet Take 25 mg by mouth daily. (Patient not taking: Reported on 06/12/2020)  . triamcinolone cream (KENALOG) 0.1 % Apply 0.1 application topically 1 day or 1 dose. (Patient not taking: Reported on 06/12/2020)   No facility-administered medications prior to visit.    Review of Systems     Objective    BP 107/75 (BP Location: Right Arm, Patient Position: Sitting, Cuff Size: Large)   Pulse (!) 105   Temp 98.1 F (36.7 C) (Oral)   Ht 5\' 9"  (1.753 m)   Wt 224 lb (101.6 kg)   BMI 33.08 kg/m  BP Readings from Last 3 Encounters:  06/12/20 107/75  06/04/20 131/85  05/27/20 122/80   Wt Readings from Last 3 Encounters:  06/12/20 224 lb (101.6 kg)  06/04/20 222 lb 7.1 oz (100.9 kg)  05/27/20 222 lb 6.4 oz (100.9 kg)      Physical Exam Vitals and nursing note reviewed.  Constitutional:      Appearance: Normal appearance. He is normal weight.  Eyes:     General: No scleral icterus.    Conjunctiva/sclera: Conjunctivae normal.  Cardiovascular:     Rate and Rhythm: Normal rate and regular rhythm.     Pulses: Normal pulses.     Heart sounds: Normal heart sounds.  Pulmonary:     Effort: Pulmonary effort is normal.     Breath sounds: Normal breath sounds.  Abdominal:     General: There is no distension.     Palpations: Abdomen is soft.     Tenderness: There is no abdominal tenderness.  Musculoskeletal:     Cervical back: Normal range of motion and neck supple.  Skin:    General: Skin is warm and dry.  Neurological:     Mental Status: He is alert and oriented to person, place, and time.     Gait: Gait normal.      Comments: Mild nystagmus noted.  Psychiatric:        Mood and Affect: Mood normal.        Behavior: Behavior normal.        Thought Content: Thought content normal.        Judgment: Judgment normal.    ECG reveals sinus tachycardia (rate just over 100) with low voltage and poss old inferior infarct. Frequent ectopic beat.  No results found for any visits on 06/12/20.  Assessment & Plan     1. Syncope, unspecified syncope type It is difficult  to determine what is going on.  Refer back to cardiology.  Due to hypotension relative today we will stop Cardizem.  I have also asked the patient and his daughter to check his blood sugar if he has any symptoms to make sure he is not hypoglycemic.  They have not done this in the past. - EKG 12-Lead - Comprehensive metabolic panel - CBC with Differential/Platelet  2. Vertigo   3. Anxiety GAD-7 and PHQ-9 on next visit  4. Major depressive disorder with single episode, in partial remission (HCC)   5. Paroxysmal atrial fibrillation ([place])   6. Heart failure with reduced ejection fraction (HCC) Low EF.  At risk of the arrhythmias.  7. CKD stage 3 due to type 2 diabetes mellitus ([place])    No follow-ups on file.      I, [name], MD, have reviewed all documentation for this visit. The documentation on 06/17/20 for the exam, diagnosis, procedures, and orders are all accurate and complete.    Richard Cranford Mon, MD  [facility] [phone] (phone) [phone] (fax)  [place]

## 2020-06-12 ENCOUNTER — Other Ambulatory Visit: Payer: Self-pay

## 2020-06-12 ENCOUNTER — Ambulatory Visit (INDEPENDENT_AMBULATORY_CARE_PROVIDER_SITE_OTHER): Payer: Medicare Other | Admitting: Family Medicine

## 2020-06-12 ENCOUNTER — Encounter: Payer: Self-pay | Admitting: Family Medicine

## 2020-06-12 VITALS — BP 107/75 | HR 105 | Temp 98.1°F | Ht 69.0 in | Wt 224.0 lb

## 2020-06-12 DIAGNOSIS — E1122 Type 2 diabetes mellitus with diabetic chronic kidney disease: Secondary | ICD-10-CM

## 2020-06-12 DIAGNOSIS — F324 Major depressive disorder, single episode, in partial remission: Secondary | ICD-10-CM | POA: Diagnosis not present

## 2020-06-12 DIAGNOSIS — R42 Dizziness and giddiness: Secondary | ICD-10-CM | POA: Diagnosis not present

## 2020-06-12 DIAGNOSIS — N183 Chronic kidney disease, stage 3 unspecified: Secondary | ICD-10-CM

## 2020-06-12 DIAGNOSIS — R55 Syncope and collapse: Secondary | ICD-10-CM

## 2020-06-12 DIAGNOSIS — F419 Anxiety disorder, unspecified: Secondary | ICD-10-CM

## 2020-06-12 DIAGNOSIS — I480 Paroxysmal atrial fibrillation: Secondary | ICD-10-CM

## 2020-06-12 DIAGNOSIS — I5020 Unspecified systolic (congestive) heart failure: Secondary | ICD-10-CM

## 2020-06-12 NOTE — Patient Instructions (Signed)
Hold Diltiazem for now.

## 2020-06-13 LAB — CBC WITH DIFFERENTIAL/PLATELET
Basophils Absolute: 0.1 10*3/uL (ref 0.0–0.2)
Basos: 1 %
EOS (ABSOLUTE): 0.5 10*3/uL — ABNORMAL HIGH (ref 0.0–0.4)
Eos: 6 %
Hematocrit: 41.5 % (ref 37.5–51.0)
Hemoglobin: 13.7 g/dL (ref 13.0–17.7)
Immature Grans (Abs): 0 10*3/uL (ref 0.0–0.1)
Immature Granulocytes: 1 %
Lymphocytes Absolute: 2.2 10*3/uL (ref 0.7–3.1)
Lymphs: 27 %
MCH: 30.5 pg (ref 26.6–33.0)
MCHC: 33 g/dL (ref 31.5–35.7)
MCV: 92 fL (ref 79–97)
Monocytes Absolute: 0.6 10*3/uL (ref 0.1–0.9)
Monocytes: 7 %
Neutrophils Absolute: 4.8 10*3/uL (ref 1.4–7.0)
Neutrophils: 58 %
Platelets: 183 10*3/uL (ref 150–450)
RBC: 4.49 x10E6/uL (ref 4.14–5.80)
RDW: 12.7 % (ref 11.6–15.4)
WBC: 8.1 10*3/uL (ref 3.4–10.8)

## 2020-06-13 LAB — COMPREHENSIVE METABOLIC PANEL
ALT: 37 IU/L (ref 0–44)
AST: 32 IU/L (ref 0–40)
Albumin/Globulin Ratio: 2 (ref 1.2–2.2)
Albumin: 4.5 g/dL (ref 3.7–4.7)
Alkaline Phosphatase: 125 IU/L — ABNORMAL HIGH (ref 44–121)
BUN/Creatinine Ratio: 9 — ABNORMAL LOW (ref 10–24)
BUN: 17 mg/dL (ref 8–27)
Bilirubin Total: 0.5 mg/dL (ref 0.0–1.2)
CO2: 19 mmol/L — ABNORMAL LOW (ref 20–29)
Calcium: 9.5 mg/dL (ref 8.6–10.2)
Chloride: 101 mmol/L (ref 96–106)
Creatinine, Ser: 1.79 mg/dL — ABNORMAL HIGH (ref 0.76–1.27)
GFR calc Af Amer: 43 mL/min/{1.73_m2} — ABNORMAL LOW (ref >59)
GFR calc non Af Amer: 37 mL/min/{1.73_m2} — ABNORMAL LOW (ref >59)
Globulin, Total: 2.3 g/dL (ref 1.5–4.5)
Glucose: 240 mg/dL — ABNORMAL HIGH (ref 65–99)
Potassium: 5 mmol/L (ref 3.5–5.2)
Sodium: 142 mmol/L (ref 134–144)
Total Protein: 6.8 g/dL (ref 6.0–8.5)

## 2020-06-17 ENCOUNTER — Telehealth: Payer: Self-pay

## 2020-06-17 NOTE — Telephone Encounter (Signed)
-----   Message from Jerrol Banana., MD sent at 06/13/2020 10:09 AM EDT ----- Labs stable, stay hydrated.

## 2020-06-17 NOTE — Telephone Encounter (Signed)
Patient advised of lab results

## 2020-07-16 ENCOUNTER — Other Ambulatory Visit: Payer: Self-pay | Admitting: Urology

## 2020-07-16 ENCOUNTER — Ambulatory Visit: Payer: Self-pay | Admitting: *Deleted

## 2020-07-16 ENCOUNTER — Other Ambulatory Visit: Payer: Self-pay | Admitting: Family Medicine

## 2020-07-16 DIAGNOSIS — R42 Dizziness and giddiness: Secondary | ICD-10-CM

## 2020-07-16 NOTE — Chronic Care Management (AMB) (Signed)
Patient's CCM enrollment status changed to previously enrolled.    Tom Moore, [place] Worker  [facility] Care Management [phone]

## 2020-07-17 ENCOUNTER — Ambulatory Visit (INDEPENDENT_AMBULATORY_CARE_PROVIDER_SITE_OTHER): Payer: Medicare Other | Admitting: Family Medicine

## 2020-07-17 ENCOUNTER — Other Ambulatory Visit: Payer: Self-pay

## 2020-07-17 ENCOUNTER — Encounter: Payer: Self-pay | Admitting: Family Medicine

## 2020-07-17 VITALS — BP 103/64 | HR 67 | Temp 98.5°F | Resp 18 | Ht 69.0 in | Wt 231.0 lb

## 2020-07-17 DIAGNOSIS — I10 Essential (primary) hypertension: Secondary | ICD-10-CM

## 2020-07-17 DIAGNOSIS — N183 Chronic kidney disease, stage 3 unspecified: Secondary | ICD-10-CM

## 2020-07-17 DIAGNOSIS — F419 Anxiety disorder, unspecified: Secondary | ICD-10-CM

## 2020-07-17 DIAGNOSIS — I480 Paroxysmal atrial fibrillation: Secondary | ICD-10-CM

## 2020-07-17 DIAGNOSIS — E782 Mixed hyperlipidemia: Secondary | ICD-10-CM | POA: Diagnosis not present

## 2020-07-17 DIAGNOSIS — I5020 Unspecified systolic (congestive) heart failure: Secondary | ICD-10-CM | POA: Diagnosis not present

## 2020-07-17 DIAGNOSIS — E1122 Type 2 diabetes mellitus with diabetic chronic kidney disease: Secondary | ICD-10-CM

## 2020-07-17 NOTE — Progress Notes (Signed)
I,[name],acting as a scribe for [name], MD.,have documented all relevant documentation on the behalf of [name], MD,as directed by  [name], MD while in the presence of [name], MD.   Established patient visit   Patient: Tom Moore   DOB: August 23, 1946   74 y.o. Male  MRN: [id number] Visit Date: 07/17/2020  Today's healthcare provider: [name], MD   Chief Complaint  Patient presents with  . Follow-up   Subjective    HPI  Patient feels much better since Delene Loll has been cut in half. Syncope, unspecified syncope type From 06/12/2020-It is difficult to determine what is going on.  Refer back to cardiology.  Due to hypotension relative today we will stop Cardizem.  I have also asked the patient and his daughter to check his blood sugar if he has any symptoms to make sure he is not hypoglycemic.  They have not done this in the past.   Patient states he is feeling much improved from last office visit.       Medications: Outpatient Medications Prior to Visit  Medication Sig  . atorvastatin (LIPITOR) 40 MG tablet Take 40 mg by mouth daily.  Marland Kitchen buPROPion (WELLBUTRIN) 75 MG tablet Take 1 tablet by mouth twice daily  . diltiazem (CARDIZEM CD) 120 MG 24 hr capsule Take 1 capsule (120 mg total) by mouth daily.  Marland Kitchen dutasteride (AVODART) 0.5 MG capsule Take 1 capsule (0.5 mg total) by mouth daily.  . furosemide (LASIX) 40 MG tablet Take 1 tablet (40 mg total) by mouth 2 (two) times daily. (Patient taking differently: Take 40 mg by mouth daily. )  . gabapentin (NEURONTIN) 400 MG capsule Take 400 mg by mouth daily. AT LUNCH   . glipiZIDE (GLUCOTROL) 10 MG tablet TAKE ONE TABLET BY MOUTH THREE TIMES DAILY BEFORE MEALS (Patient taking differently: 10 mg 2 (two) times daily before a meal. )  . glucose blood (ONE TOUCH ULTRA TEST) test strip Use 3 (three) times daily One touch ultra test strips e11.29  . insulin NPH Human (HUMULIN  N,NOVOLIN N) 100 UNIT/ML injection 28 Units 2 (two) times daily before a meal.   . meclizine (ANTIVERT) 25 MG tablet TAKE 1 TABLET BY MOUTH THREE TIMES DAILY AS NEEDED FOR  DIZZINESS  . metFORMIN (GLUCOPHAGE) 1000 MG tablet TAKE 1 TABLET BY MOUTH TWICE DAILY WITH A MEAL (Patient taking differently: daily with breakfast. )  . methimazole (TAPAZOLE) 5 MG tablet Take 5 mg by mouth.  . multivitamin-iron-minerals-folic acid (THERAPEUTIC-M) TABS tablet Take 1 tablet by mouth daily.  . Na Sulfate-K Sulfate-Mg Sulf 17.5-3.13-1.6 GM/177ML SOLN At 5 PM the day before procedure take 1 bottle and 5 hours before procedure take 1 bottle.  Marland Kitchen omeprazole (PRILOSEC) 40 MG capsule Take 1 capsule by mouth once daily  . OZEMPIC, 0.25 OR 0.5 MG/DOSE, 2 MG/1.5ML SOPN 1.5 mLs once a week.  Marland Kitchen PACERONE 200 MG tablet Take by mouth.  . sacubitril-valsartan (ENTRESTO) 24-26 MG Take by mouth.  . sertraline (ZOLOFT) 100 MG tablet Take 1 tablet by mouth once daily  . spironolactone (ALDACTONE) 25 MG tablet Take 25 mg by mouth daily.   . sucralfate (CARAFATE) 1 g tablet Take 1 tablet (1 g total) by mouth 3 (three) times daily with meals.  . TRULICITY 1.5 SA/6.3KZ SOPN Inject into the skin once a week.  . venlafaxine XR (EFFEXOR-XR) 150 MG 24 hr capsule TAKE 1 CAPSULE BY MOUTH ONCE DAILY WITH  BREAKFAST  . metoprolol succinate (TOPROL-XL) 100 MG 24 hr tablet Take 100 mg by mouth daily.  (Patient not taking: Reported on 07/17/2020)  . tamsulosin (FLOMAX) 0.4 MG CAPS capsule TAKE 2 CAPSULES BY MOUTH ONCE DAILY WITH LUNCH (Patient not taking: Reported on 07/17/2020)  . triamcinolone cream (KENALOG) 0.1 % Apply 0.1 application topically 1 day or 1 dose. (Patient not taking: Reported on 06/12/2020)   No facility-administered medications prior to visit.    Review of Systems  Constitutional: Negative for appetite change, chills and fever.  Respiratory: Negative for chest tightness, shortness of breath and wheezing.   Cardiovascular:  Negative for chest pain and palpitations.  Gastrointestinal: Negative for abdominal pain, nausea and vomiting.       Objective    BP 103/64 (BP Location: Right Arm, Patient Position: Sitting, Cuff Size: Large)   Pulse 67   Temp 98.5 F (36.9 C) (Oral)   Resp 18   Ht 5\' 9"  (1.753 m)   Wt 231 lb (104.8 kg)   SpO2 97%   BMI 34.11 kg/m  BP Readings from Last 3 Encounters:  07/17/20 103/64  06/12/20 107/75  06/04/20 131/85   Wt Readings from Last 3 Encounters:  07/17/20 231 lb (104.8 kg)  06/12/20 224 lb (101.6 kg)  06/04/20 222 lb 7.1 oz (100.9 kg)      Physical Exam Vitals and nursing note reviewed.  Constitutional:      Appearance: Normal appearance. He is normal weight.  Eyes:     General: No scleral icterus.    Conjunctiva/sclera: Conjunctivae normal.  Cardiovascular:     Rate and Rhythm: Normal rate and regular rhythm.     Pulses: Normal pulses.     Heart sounds: Normal heart sounds.  Pulmonary:     Effort: Pulmonary effort is normal.     Breath sounds: Normal breath sounds.  Abdominal:     General: There is no distension.     Palpations: Abdomen is soft.     Tenderness: There is no abdominal tenderness.  Musculoskeletal:     Cervical back: Normal range of motion and neck supple.  Skin:    General: Skin is warm and dry.  Neurological:     General: No focal deficit present.     Mental Status: He is alert and oriented to person, place, and time.     Gait: Gait normal.  Psychiatric:        Mood and Affect: Mood normal.        Behavior: Behavior normal.        Thought Content: Thought content normal.        Judgment: Judgment normal.       No results found for any visits on 07/17/20.  Assessment & Plan     1. Mixed hyperlipidemia On atorvastatin 40 - Lipid panel  2. Heart failure with reduced ejection fraction (HCC) Clinically improved on half of Entresto dose.  3. Paroxysmal atrial fibrillation (HCC)   4. Primary hypertension   5. CKD  stage 3 due to type 2 diabetes mellitus ([place])   6. Anxiety Chronic issue.  Clinically stable.   No follow-ups on file.         Mane Consolo Cranford Mon, MD  [facility] [phone] (phone) [phone] (fax)  [place]

## 2020-07-22 ENCOUNTER — Encounter: Payer: Self-pay | Admitting: Family Medicine

## 2020-07-23 LAB — LIPID PANEL
Chol/HDL Ratio: 3.1 ratio (ref 0.0–5.0)
Cholesterol, Total: 139 mg/dL (ref 100–199)
HDL: 45 mg/dL (ref >39)
LDL Chol Calc (NIH): 64 mg/dL (ref 0–99)
Triglycerides: 180 mg/dL — ABNORMAL HIGH (ref 0–149)
VLDL Cholesterol Cal: 30 mg/dL (ref 5–40)

## 2020-08-18 ENCOUNTER — Telehealth: Payer: Self-pay

## 2020-08-18 NOTE — Progress Notes (Signed)
08/18/2020 attempted to call pt. Mail box is full.  08/18/2020 [facility] to discuss correct medication with Dr. Gabriel Carina (20 min). [facility] referred pt to [employer] for pt assistance with Insulin and Trulcity.  Trulicity appears to have been d/c in 04/2019 due to cost. 05/07/2020 has pt on NPH insulin, glipizide, Ozempic, and Metformin. In a telephone encounter dated 12/12/2019, Hoyle Sauer @[facility]  and Gordy Councilman, [facility], discussed patient looking for Trulicity delivery after patient assistance paperwork was sent in for it.  The d/c and patient assistant paperwork are several months apart.  Discussed with Junius Argyle, Pharm D, and Ozempic and Trulcity are in the same class of medication. Call put in to identify correct medication(s) to send patient assistance paperwork to patient.   Per Janett Billow at [facility] patient should be on Ozempic.  Trulcity was d/c    Chronic Care Management Pharmacy Assistant   Name: Tom Moore  MRN: [id number] DOB: 11-14-1945  Reason for Encounter: Medication Management  Patient Questions:  Pt referred by  [facility] for patient assistance. Tom Moore,  74 y.o. , male presents for their CCM visit with the clinical pharmacist PCP : Jerrol Banana., MD  Allergies:   Allergies  Allergen Reactions  . Zyrtec Allergy [Cetirizine] Diarrhea    Medications: Outpatient Encounter Medications as of 08/18/2020  Medication Sig  . atorvastatin (LIPITOR) 40 MG tablet Take 40 mg by mouth daily.  Marland Kitchen buPROPion (WELLBUTRIN) 75 MG tablet Take 1 tablet by mouth twice daily  . diltiazem (CARDIZEM CD) 120 MG 24 hr capsule Take 1 capsule (120 mg total) by mouth daily.  Marland Kitchen dutasteride (AVODART) 0.5 MG capsule Take 1 capsule (0.5 mg total) by mouth daily.  . furosemide (LASIX) 40 MG tablet Take 1 tablet (40 mg total) by mouth 2 (two) times daily. (Patient taking differently: Take 40 mg by mouth daily. )  . gabapentin (NEURONTIN) 400 MG  capsule Take 400 mg by mouth daily. AT LUNCH   . glipiZIDE (GLUCOTROL) 10 MG tablet TAKE ONE TABLET BY MOUTH THREE TIMES DAILY BEFORE MEALS (Patient taking differently: 10 mg 2 (two) times daily before a meal. )  . glucose blood (ONE TOUCH ULTRA TEST) test strip Use 3 (three) times daily One touch ultra test strips e11.29  . insulin NPH Human (HUMULIN N,NOVOLIN N) 100 UNIT/ML injection 28 Units 2 (two) times daily before a meal.   . meclizine (ANTIVERT) 25 MG tablet TAKE 1 TABLET BY MOUTH THREE TIMES DAILY AS NEEDED FOR  DIZZINESS  . metFORMIN (GLUCOPHAGE) 1000 MG tablet TAKE 1 TABLET BY MOUTH TWICE DAILY WITH A MEAL (Patient taking differently: daily with breakfast. )  . methimazole (TAPAZOLE) 5 MG tablet Take 5 mg by mouth.  . metoprolol succinate (TOPROL-XL) 100 MG 24 hr tablet Take 100 mg by mouth daily.  (Patient not taking: Reported on 07/17/2020)  . multivitamin-iron-minerals-folic acid (THERAPEUTIC-M) TABS tablet Take 1 tablet by mouth daily.  . Na Sulfate-K Sulfate-Mg Sulf 17.5-3.13-1.6 GM/177ML SOLN At 5 PM the day before procedure take 1 bottle and 5 hours before procedure take 1 bottle.  Marland Kitchen omeprazole (PRILOSEC) 40 MG capsule Take 1 capsule by mouth once daily  . OZEMPIC, 0.25 OR 0.5 MG/DOSE, 2 MG/1.5ML SOPN 1.5 mLs once a week.  Marland Kitchen PACERONE 200 MG tablet Take by mouth.  . sacubitril-valsartan (ENTRESTO) 24-26 MG Take by mouth.  . sertraline (ZOLOFT) 100 MG tablet Take 1 tablet by mouth once daily  . spironolactone (ALDACTONE) 25 MG tablet  Take 25 mg by mouth daily.   . sucralfate (CARAFATE) 1 g tablet Take 1 tablet (1 g total) by mouth 3 (three) times daily with meals.  . tamsulosin (FLOMAX) 0.4 MG CAPS capsule TAKE 2 CAPSULES BY MOUTH ONCE DAILY WITH LUNCH (Patient not taking: Reported on 07/17/2020)  . triamcinolone cream (KENALOG) 0.1 % Apply 0.1 application topically 1 day or 1 dose. (Patient not taking: Reported on 06/12/2020)  . TRULICITY 1.5 LA/4.5XM SOPN Inject into the skin once  a week.  . venlafaxine XR (EFFEXOR-XR) 150 MG 24 hr capsule TAKE 1 CAPSULE BY MOUTH ONCE DAILY WITH BREAKFAST   No facility-administered encounter medications on file as of 08/18/2020.    Current Diagnosis: Patient Assistance Coordination   Follow-Up:  Patient Assistance Coordination Mailed application for Ozempic and Novolin N patient assistance to patient. Patient to complete and return it and any needed supporting documents for so that Lucilla Lame can provide prescription for application. Patient may contact me if any questions, phone number provided.

## 2020-08-27 ENCOUNTER — Ambulatory Visit (INDEPENDENT_AMBULATORY_CARE_PROVIDER_SITE_OTHER): Payer: Medicare Other | Admitting: Urology

## 2020-08-27 ENCOUNTER — Other Ambulatory Visit: Payer: Self-pay

## 2020-08-27 ENCOUNTER — Encounter: Payer: Self-pay | Admitting: Urology

## 2020-08-27 VITALS — BP 133/88 | HR 65 | Ht 69.0 in | Wt 223.0 lb

## 2020-08-27 DIAGNOSIS — R351 Nocturia: Secondary | ICD-10-CM

## 2020-08-27 DIAGNOSIS — N401 Enlarged prostate with lower urinary tract symptoms: Secondary | ICD-10-CM

## 2020-08-27 NOTE — Addendum Note (Signed)
Addended by: Tommy Rainwater on: 08/27/2020 02:51 PM   Modules accepted: Orders

## 2020-08-27 NOTE — Progress Notes (Signed)
08/27/2020 1:05 PM   Jshawn Johny Sax [birth year] [id number]  Referring provider: Jerrol Banana., MD [address] [place],  [place] [address]  Chief Complaint  Patient presents with  . Other    HPI: 74 y.o. male presents for annual follow-up.     Follow-up for BPH and nocturia  Remains on tamsulosin and dutasteride  Stable LUTS, mild postvoid dribbling  Denies dysuria, gross hematuria  Denies flank, abdominal or pelvic pain  Remains on immediate release oxybutynin at bedtime for nocturia   PMH: Past Medical History:  Diagnosis Date  . Anxiety   . Arthritis   . CHF (congestive heart failure) ([place])   . Depression   . Diabetes mellitus without complication ([place])   . Dysrhythmia    atrial fib  . GERD (gastroesophageal reflux disease)   . Hyperlipidemia   . Hypertension   . Hyperthyroidism   . Insomnia   . Neuromuscular disorder ([place])    diabetic neuropathy  . Orthopnea    uses extra pillow to sleep  . Sleep apnea    unable to tolerater CPAP    Surgical History: Past Surgical History:  Procedure Laterality Date  . CARDIAC CATHETERIZATION    . CARPAL TUNNEL RELEASE Right 04/10/2018   Procedure: RELEASE MEDIAN NERVE AT CARPAL TUNNEL;  Surgeon: Earnestine Leys, MD;  Location: [facility] ORS;  Service: Orthopedics;  Laterality: Right;  . CARPAL TUNNEL RELEASE Left 05/08/2018   Procedure: CARPAL TUNNEL RELEASE;  Surgeon: Earnestine Leys, MD;  Location: [facility] ORS;  Service: Orthopedics;  Laterality: Left;  . CATARACT EXTRACTION W/PHACO Right 08/16/2019   Procedure: CATARACT EXTRACTION PHACO AND INTRAOCULAR LENS PLACEMENT ([place]) RIGHT;  Surgeon: Marchia Meiers, MD;  Location: [facility] ORS;  Service: Ophthalmology;  Laterality: Right;  [place] 01:30.3 CDE 13.71 Fluid Pack lot # [id number]  . COLONOSCOPY WITH PROPOFOL N/A 06/04/2020   Procedure: COLONOSCOPY WITH PROPOFOL;  Surgeon: Virgel Manifold, MD;  Location: [facility] ENDOSCOPY;  Service: Endoscopy;  Laterality:  N/A;  . ESOPHAGOGASTRODUODENOSCOPY (EGD) WITH PROPOFOL N/A 06/04/2020   Procedure: ESOPHAGOGASTRODUODENOSCOPY (EGD) WITH PROPOFOL;  Surgeon: Virgel Manifold, MD;  Location: [facility] ENDOSCOPY;  Service: Endoscopy;  Laterality: N/A;  . TONSILLECTOMY      Home Medications:  Allergies as of 08/27/2020      Reactions   Zyrtec Allergy [cetirizine] Diarrhea      Medication List       Accurate as of August 27, 2020  1:05 PM. If you have any questions, ask your nurse or doctor.        atorvastatin 40 MG tablet Commonly known as: LIPITOR Take 40 mg by mouth daily.   buPROPion 75 MG tablet Commonly known as: WELLBUTRIN Take 1 tablet by mouth twice daily   diltiazem 120 MG 24 hr capsule Commonly known as: CARDIZEM CD Take 1 capsule (120 mg total) by mouth daily.   dutasteride 0.5 MG capsule Commonly known as: AVODART Take 1 capsule (0.5 mg total) by mouth daily.   furosemide 40 MG tablet Commonly known as: LASIX Take 1 tablet (40 mg total) by mouth 2 (two) times daily. What changed: when to take this   gabapentin 400 MG capsule Commonly known as: NEURONTIN Take 400 mg by mouth daily. AT LUNCH   glipiZIDE 10 MG tablet Commonly known as: GLUCOTROL TAKE ONE TABLET BY MOUTH THREE TIMES DAILY BEFORE MEALS What changed:   how much to take  when to take this  additional instructions   insulin NPH Human 100  UNIT/ML injection Commonly known as: NOVOLIN N 28 Units 2 (two) times daily before a meal.   meclizine 25 MG tablet Commonly known as: ANTIVERT TAKE 1 TABLET BY MOUTH THREE TIMES DAILY AS NEEDED FOR  DIZZINESS   metFORMIN 1000 MG tablet Commonly known as: GLUCOPHAGE TAKE 1 TABLET BY MOUTH TWICE DAILY WITH A MEAL What changed: See the new instructions.   methimazole 5 MG tablet Commonly known as: TAPAZOLE Take 5 mg by mouth.   metoprolol succinate 100 MG 24 hr tablet Commonly known as: TOPROL-XL Take 100 mg by mouth daily.   multivitamin-iron-minerals-folic  acid Tabs tablet Take 1 tablet by mouth daily.   Na Sulfate-K Sulfate-Mg Sulf 17.5-3.13-1.6 GM/177ML Soln At 5 PM the day before procedure take 1 bottle and 5 hours before procedure take 1 bottle.   omeprazole 40 MG capsule Commonly known as: PRILOSEC Take 1 capsule by mouth once daily   ONE TOUCH ULTRA TEST test strip Generic drug: glucose blood Use 3 (three) times daily One touch ultra test strips e11.29   Ozempic (0.25 or 0.5 MG/DOSE) 2 MG/1.5ML Sopn Generic drug: Semaglutide(0.25 or 0.5MG /DOS) 1.5 mLs once a week.   Pacerone 200 MG tablet Generic drug: amiodarone Take by mouth.   sacubitril-valsartan 24-26 MG Commonly known as: ENTRESTO Take by mouth.   sertraline 100 MG tablet Commonly known as: ZOLOFT Take 1 tablet by mouth once daily   spironolactone 25 MG tablet Commonly known as: ALDACTONE Take 25 mg by mouth daily.   sucralfate 1 g tablet Commonly known as: CARAFATE Take 1 tablet (1 g total) by mouth 3 (three) times daily with meals.   tamsulosin 0.4 MG Caps capsule Commonly known as: FLOMAX TAKE 2 CAPSULES BY MOUTH ONCE DAILY WITH LUNCH   triamcinolone 0.1 % Commonly known as: KENALOG Apply 0.1 application topically 1 day or 1 dose.   Trulicity 1.5 ZO/1.0RU Sopn Generic drug: Dulaglutide Inject into the skin once a week.   venlafaxine XR 150 MG 24 hr capsule Commonly known as: EFFEXOR-XR TAKE 1 CAPSULE BY MOUTH ONCE DAILY WITH BREAKFAST       Allergies:  Allergies  Allergen Reactions  . Zyrtec Allergy [Cetirizine] Diarrhea    Family History: Family History  Problem Relation Age of Onset  . Hypertension Mother   . Heart disease Mother     Social History:  reports that he quit smoking about 32 years ago. He has never used smokeless tobacco. He reports previous alcohol use. He reports that he does not use drugs.   Physical Exam: BP 133/88   Pulse 65   Ht 5\' 9"  (1.753 m)   Wt 223 lb (101.2 kg)   BMI 32.93 kg/m   Constitutional:   Alert, No acute distress. HEENT: [place] AT, moist mucus membranes.  Trachea midline, no masses. Cardiovascular: No clubbing, cyanosis, or edema. Respiratory: Normal respiratory effort, no increased work of breathing. Skin: No rashes, bruises or suspicious lesions. Neurologic: Grossly intact, no focal deficits, moving all 4 extremities. Psychiatric: Normal mood and affect.   Assessment & Plan:    1.  BPH with LUTS  Stable voiding symptoms on combination therapy  He indicated his pharmacy sends refill requests  2.  Nocturia  Stable  Continue annual follow-up  Abbie Sons, MD  [facility] [address] [place], [place] [address] [phone]

## 2020-08-29 ENCOUNTER — Other Ambulatory Visit: Payer: Self-pay | Admitting: Family Medicine

## 2020-08-29 ENCOUNTER — Other Ambulatory Visit: Payer: Self-pay | Admitting: Urology

## 2020-08-29 DIAGNOSIS — F32A Depression, unspecified: Secondary | ICD-10-CM

## 2020-08-29 DIAGNOSIS — F324 Major depressive disorder, single episode, in partial remission: Secondary | ICD-10-CM

## 2020-08-29 LAB — URINALYSIS, COMPLETE
Bilirubin, UA: NEGATIVE
Glucose, UA: NEGATIVE
Leukocytes,UA: NEGATIVE
Nitrite, UA: NEGATIVE
RBC, UA: NEGATIVE
Specific Gravity, UA: >1.03 — ABNORMAL HIGH (ref 1.005–1.030)
Urobilinogen, Ur: 2 mg/dL — ABNORMAL HIGH (ref 0.2–1.0)
pH, UA: 5.5 (ref 5.0–7.5)

## 2020-08-29 LAB — MICROSCOPIC EXAMINATION
Bacteria, UA: NONE SEEN
RBC, Urine: NONE SEEN /hpf (ref 0–2)

## 2020-09-25 ENCOUNTER — Other Ambulatory Visit: Payer: Self-pay | Admitting: Family Medicine

## 2020-09-25 ENCOUNTER — Other Ambulatory Visit: Payer: Self-pay | Admitting: Urology

## 2020-09-25 DIAGNOSIS — R110 Nausea: Secondary | ICD-10-CM

## 2020-09-25 DIAGNOSIS — R141 Gas pain: Secondary | ICD-10-CM

## 2020-09-25 DIAGNOSIS — F32A Depression, unspecified: Secondary | ICD-10-CM

## 2020-10-22 ENCOUNTER — Encounter: Payer: Self-pay | Admitting: Family Medicine

## 2020-10-22 ENCOUNTER — Other Ambulatory Visit: Payer: Self-pay

## 2020-10-22 ENCOUNTER — Ambulatory Visit (INDEPENDENT_AMBULATORY_CARE_PROVIDER_SITE_OTHER): Payer: Medicare Other | Admitting: Family Medicine

## 2020-10-22 VITALS — BP 121/70 | HR 83 | Temp 98.2°F | Resp 18 | Ht 69.0 in | Wt 233.0 lb

## 2020-10-22 DIAGNOSIS — F324 Major depressive disorder, single episode, in partial remission: Secondary | ICD-10-CM | POA: Diagnosis not present

## 2020-10-22 DIAGNOSIS — E1122 Type 2 diabetes mellitus with diabetic chronic kidney disease: Secondary | ICD-10-CM

## 2020-10-22 DIAGNOSIS — Z23 Encounter for immunization: Secondary | ICD-10-CM

## 2020-10-22 DIAGNOSIS — Z0000 Encounter for general adult medical examination without abnormal findings: Secondary | ICD-10-CM | POA: Diagnosis not present

## 2020-10-22 DIAGNOSIS — N183 Chronic kidney disease, stage 3 unspecified: Secondary | ICD-10-CM

## 2020-10-22 MED ORDER — BUPROPION HCL ER (SR) 150 MG PO TB12: 150.0000 mg | ORAL_TABLET | Freq: Two times a day (BID) | ORAL | 0 refills | Status: DC

## 2020-10-22 NOTE — Progress Notes (Signed)
I,[name],acting as a scribe for [name], MD.,have documented all relevant documentation on the behalf of [name], MD,as directed by  [name], MD while in the presence of [name], MD.   Annual Wellness Visit     Patient: Tom Moore, Male    DOB: April 21, 1946, 75 y.o.   MRN: [id number] Visit Date: 10/22/2020  Today's Provider: [name], MD   Chief Complaint  Patient presents with  . Medicare Wellness   Subjective    Tom Moore is a 75 y.o. male who presents today for his Annual Wellness Visit./CPE He reports consuming a general diet. The patient does not participate in regular exercise at present. He generally feels fairly well. He reports sleeping fairly well. He does not have additional problems to discuss today.  Patient has gained 10 pounds.  He admits to some anhedonia. HPI       Medications: Outpatient Medications Prior to Visit  Medication Sig  . atorvastatin (LIPITOR) 40 MG tablet Take 40 mg by mouth daily.  Marland Kitchen buPROPion (WELLBUTRIN) 75 MG tablet Take 1 tablet by mouth twice daily  . diltiazem (CARDIZEM CD) 120 MG 24 hr capsule Take 1 capsule (120 mg total) by mouth daily.  Marland Kitchen dutasteride (AVODART) 0.5 MG capsule Take 1 capsule (0.5 mg total) by mouth daily.  . furosemide (LASIX) 40 MG tablet Take 1 tablet (40 mg total) by mouth 2 (two) times daily. (Patient taking differently: Take 40 mg by mouth daily.)  . gabapentin (NEURONTIN) 400 MG capsule Take 400 mg by mouth daily. AT LUNCH   . glipiZIDE (GLUCOTROL) 10 MG tablet TAKE ONE TABLET BY MOUTH THREE TIMES DAILY BEFORE MEALS (Patient taking differently: 10 mg 2 (two) times daily before a meal.)  . glucose blood test strip Use 3 (three) times daily One touch ultra test strips e11.29  . insulin NPH Human (HUMULIN N,NOVOLIN N) 100 UNIT/ML injection 28 Units 2 (two) times daily before a meal.   . metFORMIN (GLUCOPHAGE) 1000 MG tablet TAKE 1 TABLET BY MOUTH  TWICE DAILY WITH A MEAL (Patient taking differently: daily with breakfast.)  . methimazole (TAPAZOLE) 5 MG tablet Take 5 mg by mouth.  . metoprolol succinate (TOPROL-XL) 100 MG 24 hr tablet Take 100 mg by mouth daily.  . multivitamin-iron-minerals-folic acid (THERAPEUTIC-M) TABS tablet Take 1 tablet by mouth daily.  . Na Sulfate-K Sulfate-Mg Sulf 17.5-3.13-1.6 GM/177ML SOLN At 5 PM the day before procedure take 1 bottle and 5 hours before procedure take 1 bottle.  Marland Kitchen omeprazole (PRILOSEC) 40 MG capsule Take 1 capsule by mouth once daily  . PACERONE 200 MG tablet Take by mouth.  . sacubitril-valsartan (ENTRESTO) 24-26 MG Take by mouth.  . sertraline (ZOLOFT) 100 MG tablet Take 1 tablet by mouth once daily  . spironolactone (ALDACTONE) 25 MG tablet Take 25 mg by mouth daily.   . sucralfate (CARAFATE) 1 g tablet TAKE 1 TABLET BY MOUTH THREE TIMES DAILY WITH MEALS  . tamsulosin (FLOMAX) 0.4 MG CAPS capsule TAKE 2 CAPSULES BY MOUTH ONCE DAILY WITH LUNCH  . TRULICITY 1.5 HA/1.9FX SOPN Inject into the skin once a week.  . venlafaxine XR (EFFEXOR-XR) 150 MG 24 hr capsule TAKE 1 CAPSULE BY MOUTH ONCE DAILY WITH BREAKFAST  . meclizine (ANTIVERT) 25 MG tablet TAKE 1 TABLET BY MOUTH THREE TIMES DAILY AS NEEDED FOR  DIZZINESS (Patient not taking: Reported on 10/22/2020)  . OZEMPIC, 0.25 OR 0.5 MG/DOSE, 2 MG/1.5ML SOPN 1.5  mLs once a week. (Patient not taking: Reported on 10/22/2020)  . triamcinolone cream (KENALOG) 0.1 % Apply 0.1 application topically 1 day or 1 dose. (Patient not taking: No sig reported)   No facility-administered medications prior to visit.    Allergies  Allergen Reactions  . Zyrtec Allergy [Cetirizine] Diarrhea    Patient Care Team: Jerrol Banana., MD as PCP - General (Family Medicine)  Review of Systems  All other systems reviewed and are negative.      Objective    Vitals: BP 121/70 (BP Location: Left Arm, Patient Position: Sitting, Cuff Size: Large)   Pulse 83    Temp 98.2 F (36.8 C) (Oral)   Resp 18   Ht 5\' 9"  (1.753 m)   Wt 233 lb (105.7 kg)   SpO2 98%   BMI 34.41 kg/m  BP Readings from Last 3 Encounters:  10/22/20 121/70  08/27/20 133/88  07/17/20 103/64   Wt Readings from Last 3 Encounters:  10/22/20 233 lb (105.7 kg)  08/27/20 223 lb (101.2 kg)  07/17/20 231 lb (104.8 kg)      Physical Exam Vitals and nursing note reviewed.  Constitutional:      Appearance: Normal appearance. He is normal weight.  HENT:     Head: Normocephalic and atraumatic.     Right Ear: External ear normal.     Left Ear: External ear normal.  Eyes:     General: No scleral icterus.    Conjunctiva/sclera: Conjunctivae normal.  Cardiovascular:     Rate and Rhythm: Normal rate and regular rhythm.     Pulses: Normal pulses.     Heart sounds: Normal heart sounds.  Pulmonary:     Effort: Pulmonary effort is normal.     Breath sounds: Normal breath sounds.  Abdominal:     General: There is no distension.     Palpations: Abdomen is soft.     Tenderness: There is no abdominal tenderness.  Genitourinary:    Penis: Normal.      Testes: Normal.  Musculoskeletal:     Cervical back: Normal range of motion and neck supple.  Skin:    General: Skin is warm and dry.  Neurological:     General: No focal deficit present.     Mental Status: He is alert and oriented to person, place, and time.     Gait: Gait normal.  Psychiatric:        Mood and Affect: Mood normal.        Behavior: Behavior normal.        Thought Content: Thought content normal.        Judgment: Judgment normal.      Most recent functional status assessment: In your present state of health, do you have any difficulty performing the following activities: 10/22/2020  Hearing? N  Vision? N  Difficulty concentrating or making decisions? Y  Walking or climbing stairs? Y  Dressing or bathing? N  Doing errands, shopping? N  Some recent data might be hidden   Most recent fall risk  assessment: Fall Risk  10/22/2020  Falls in the past year? 1  Number falls in past yr: 1  Injury with Fall? 0  Follow up Falls evaluation completed    Most recent depression screenings: PHQ 2/9 Scores 10/22/2020 07/05/2019  PHQ - 2 Score 2 4  PHQ- 9 Score 8 17   Most recent cognitive screening: 6CIT Screen 12/05/2018  What Year? 0 points  What month? 0 points  What  time? 0 points  Count back from 20 0 points  Months in reverse 0 points  Repeat phrase 0 points  Total Score 0   Most recent Audit-C alcohol use screening Alcohol Use Disorder Test (AUDIT) 10/22/2020  1. How often do you have a drink containing alcohol? 1  2. How many drinks containing alcohol do you have on a typical day when you are drinking? 0  3. How often do you have six or more drinks on one occasion? 1  AUDIT-C Score 2  Alcohol Brief Interventions/Follow-up AUDIT Score <7 follow-up not indicated   A score of 3 or more in women, and 4 or more in men indicates increased risk for alcohol abuse, EXCEPT if all of the points are from question 1   No results found for any visits on 10/22/20.  Assessment & Plan     Annual wellness visit done today including the all of the following: Reviewed patient's Family Medical History Reviewed and updated list of patient's medical providers Assessment of cognitive impairment was done Assessed patient's functional ability Established a written schedule for health screening [place] Completed and Reviewed  Exercise Activities and Dietary recommendations Goals    .  " I would like to feel better (pt-stated)      Current Barriers:  . Financial constraints . Mental Health Concerns   Clinical Social Work Clinical Goal(s):  Marland Kitchen Over the next 30  days, client will work with SW to address concerns related to his symptoms of depression  Interventions: . Patient interviewed and appropriate assessments performed . Continued to provide mental health counseling  and emotional support with regard to patient's symptoms of depression  . Patient continues to report no side affects with the current anti-depressant prescribed and feels that the medication has been effective . Continued to emphasized self care and utilizing healthy coping strategies for depression . Discussed plans with patient for ongoing care management follow up and provided patient with direct contact information for care management team . Patient continues to deny need for outpatient therapy referral at this time   Patient Self Care Activities:  . Self administers medications as prescribed . Attends all scheduled provider appointments . Performs ADL's independently . Performs IADL's independently  Please see past updates related to this goal by clicking on the "Past Updates" button in the selected goal         Immunization History  Administered Date(s) Administered  . PFIZER(Purple Top)SARS-COV-2 Vaccination 12/02/2019, 12/26/2019    Health Maintenance  Topic Date Due  . Hepatitis C Screening  Never done  . FOOT EXAM  Never done  . URINE MICROALBUMIN  Never done  . TETANUS/TDAP  Never done  . PNA vac Low Risk Adult (1 of 2 - PCV13) Never done  . HEMOGLOBIN A1C  12/19/2018  . COVID-19 Vaccine (3 - Pfizer risk 4-dose series) 01/23/2020  . INFLUENZA VACCINE  Never done  . OPHTHALMOLOGY EXAM  05/21/2021  . COLONOSCOPY (Pts 45-65yrs Insurance coverage will need to be confirmed)  06/04/2022     Discussed health benefits of physical activity, and encouraged him to engage in regular exercise appropriate for his age and condition.    1. Encounter for annual wellness visit (AWV) in Medicare patient   2. Annual physical exam   3. Need for pneumococcal vaccination  - Pneumococcal polysaccharide vaccine 23-valent greater than or equal to 2yo subcutaneous/IM  4. Major depressive disorder with single episode, in partial remission (HCC) The [facility] 9 is  9.  Decrease bupropion to  150 mg twice daily.  Follow-up 1 to 2 months. - buPROPion (WELLBUTRIN SR) 150 MG 12 hr tablet; Take 1 tablet (150 mg total) by mouth 2 (two) times daily.  Dispense: 180 tablet; Refill: 0  5. CKD stage 3 due to type 2 diabetes mellitus ([place])   6. Controlled type 2 diabetes mellitus with stage 3 chronic kidney disease, unspecified whether long term insulin use ([place]) Followed by endocrinology.   No follow-ups on file.     I, [name], MD, have reviewed all documentation for this visit. The documentation on 10/26/20 for the exam, diagnosis, procedures, and orders are all accurate and complete.    Haillie Radu Cranford Mon, MD  [facility] [phone] (phone) [phone] (fax)  [place]

## 2020-10-29 ENCOUNTER — Telehealth (INDEPENDENT_AMBULATORY_CARE_PROVIDER_SITE_OTHER): Payer: Medicare Other | Admitting: Physician Assistant

## 2020-10-29 ENCOUNTER — Ambulatory Visit: Payer: Self-pay | Admitting: *Deleted

## 2020-10-29 DIAGNOSIS — Z5329 Procedure and treatment not carried out because of patient's decision for other reasons: Secondary | ICD-10-CM

## 2020-10-29 DIAGNOSIS — Z91199 Patient's noncompliance with other medical treatment and regimen due to unspecified reason: Secondary | ICD-10-CM

## 2020-10-29 NOTE — Progress Notes (Signed)
Called twice at allotted time, no answer LMTCB.

## 2020-10-29 NOTE — Telephone Encounter (Signed)
Contacted pt regarding his symptoms; the pt says he has been having intermittent shaking in his hands, legs, and body started shaking since 10/23/20; changing his position originally helped but is no longer effective; he rates his discomfort at 6 out of 10; he is having weakness in the effected area also; the pt says he is loosing his balance, and he can not walk straight; the pt also says he has been having shortness of breath since that time; recommendations made per nurse triage protocol; he normally sees Dr Rosanna Randy but this provider has no availability; spoke with Jiles Garter regarding scheduling the pt; she states Carles Collet has availability; decision tree completed; explained to pt virtual visit was needed due to his symptoms; he became upset due to the questions regarding the decision tree process;  pt offered and acceptd MyChart appt with Carles Collet 10/29/20 at 1340; [place] notified of events; will route to office for notifiecation  Reason for Disposition . [1] New-onset muscle jerks AND [2] unexplained AND [3] 3 or more times/day  Answer Assessment - Initial Assessment Questions 1. APPEARANCE of MOVEMENT: "What did the jerking or twitching look like?" (e.g., body area)     shaking 2. ONSET: "When did this start happening?" (e.g., hours, days, weeks, months ago)    10/25/19 3. DURATION: "How long does the jerk, twitch, or spasm last?"    10-15 minutes 4. FREQUENCY:  "How often does this happen?"     All day long 5. WHEN: "When does this happen?" (e.g., while awake, while falling asleep, while sleeping)     Wen awake 6. CAUSE: "What do you think caused the "   Not sure 7. OTHER SYMPTOMS: "Are there any other symptoms?" (e.g., fever, headache)     Headache on 10/28/20 8. PREGNANCY: "Is there any chance you are pregnant?" "When was your last menstrual period?"    n/a  Protocols used: [facility] - [facility] - [facility]

## 2020-11-03 ENCOUNTER — Ambulatory Visit: Payer: Self-pay

## 2020-11-03 NOTE — Telephone Encounter (Signed)
Pt. Reports he fainted Sunday morning - 0500. "I woke up on the bathroom floor." Denies any injury. States "I also fainted a few days before this." Denies any other symptoms this morning. No availability in the practice today per [place]. Instructed pt. To go to [facility]. "States I've been a pt. There for 30 years and I can't be seen today. I'm getting a new doctor."   Reason for Disposition . [1] All other patients AND [2] now alert and feels fine(Exception: SIMPLE FAINT due to stress, pain, prolonged standing, or suddenly standing)  Answer Assessment - Initial Assessment Questions 1. ONSET: "How long were you unconscious?" (minutes) "When did it happen?"     Sunday morning 2. CONTENT: "What happened during period of unconsciousness?" (e.g., seizure activity)      Unknown 3. MENTAL STATUS: "Alert and oriented now?" (oriented x 3 = name, month, location)      Yes 4. TRIGGER: "What do you think caused the fainting?" "What were you doing just before you fainted?"  (e.g., exercise, sudden standing up, prolonged standing)     Unsure 5. RECURRENT SYMPTOM: "Have you ever passed out before?" If Yes, ask: "When was the last time?" and "What happened that time?"      Yes - 1-2 months ago 6. INJURY: "Did you sustain any injury during the fall?"      No 7. CARDIAC SYMPTOMS: "Have you had any of the following symptoms: chest pain, difficulty breathing, palpitations?"     Shortness of breath - several months 8. NEUROLOGIC SYMPTOMS: "Have you had any of the following symptoms: headache, numbness, vertigo, weakness?"     No 9. GI SYMPTOMS: "Have you had any of the following symptoms: abdominal pain, vomiting, diarrhea, blood in stools?"     No 10. OTHER SYMPTOMS: "Do you have any other symptoms?"       No 11. PREGNANCY: "Is there any chance you are pregnant?" "When was your last menstrual period?"       n/a  Protocols used: [facility]

## 2020-11-04 ENCOUNTER — Encounter: Payer: Self-pay | Admitting: Emergency Medicine

## 2020-11-04 ENCOUNTER — Other Ambulatory Visit: Payer: Self-pay

## 2020-11-04 ENCOUNTER — Observation Stay: Payer: Medicare Other

## 2020-11-04 ENCOUNTER — Observation Stay
Admit: 2020-11-04 | Discharge: 2020-11-04 | Disposition: A | Discharge status: Home / Self Care | Payer: Medicare Other | Attending: Internal Medicine | Admitting: Internal Medicine

## 2020-11-04 ENCOUNTER — Inpatient Hospital Stay
Admission: EM | Admit: 2020-11-04 | Discharge: 2020-11-06 | DRG: 312 | Disposition: A | Discharge status: Home / Self Care | Payer: Medicare Other | Attending: Internal Medicine | Admitting: Internal Medicine

## 2020-11-04 DIAGNOSIS — Z87891 Personal history of nicotine dependence: Secondary | ICD-10-CM

## 2020-11-04 DIAGNOSIS — I5022 Chronic systolic (congestive) heart failure: Secondary | ICD-10-CM

## 2020-11-04 DIAGNOSIS — N401 Enlarged prostate with lower urinary tract symptoms: Secondary | ICD-10-CM | POA: Diagnosis not present

## 2020-11-04 DIAGNOSIS — Z794 Long term (current) use of insulin: Secondary | ICD-10-CM

## 2020-11-04 DIAGNOSIS — Z7984 Long term (current) use of oral hypoglycemic drugs: Secondary | ICD-10-CM

## 2020-11-04 DIAGNOSIS — N1832 Chronic kidney disease, stage 3b: Secondary | ICD-10-CM | POA: Diagnosis not present

## 2020-11-04 DIAGNOSIS — I130 Hypertensive heart and chronic kidney disease with heart failure and stage 1 through stage 4 chronic kidney disease, or unspecified chronic kidney disease: Secondary | ICD-10-CM | POA: Diagnosis present

## 2020-11-04 DIAGNOSIS — I951 Orthostatic hypotension: Secondary | ICD-10-CM | POA: Diagnosis not present

## 2020-11-04 DIAGNOSIS — R55 Syncope and collapse: Secondary | ICD-10-CM

## 2020-11-04 DIAGNOSIS — E782 Mixed hyperlipidemia: Secondary | ICD-10-CM | POA: Diagnosis present

## 2020-11-04 DIAGNOSIS — I10 Essential (primary) hypertension: Secondary | ICD-10-CM

## 2020-11-04 DIAGNOSIS — I480 Paroxysmal atrial fibrillation: Secondary | ICD-10-CM | POA: Diagnosis present

## 2020-11-04 DIAGNOSIS — E1122 Type 2 diabetes mellitus with diabetic chronic kidney disease: Secondary | ICD-10-CM | POA: Diagnosis present

## 2020-11-04 DIAGNOSIS — Z20822 Contact with and (suspected) exposure to covid-19: Secondary | ICD-10-CM | POA: Diagnosis present

## 2020-11-04 DIAGNOSIS — Z79899 Other long term (current) drug therapy: Secondary | ICD-10-CM

## 2020-11-04 DIAGNOSIS — Z8249 Family history of ischemic heart disease and other diseases of the circulatory system: Secondary | ICD-10-CM

## 2020-11-04 DIAGNOSIS — K219 Gastro-esophageal reflux disease without esophagitis: Secondary | ICD-10-CM | POA: Diagnosis present

## 2020-11-04 DIAGNOSIS — E11649 Type 2 diabetes mellitus with hypoglycemia without coma: Secondary | ICD-10-CM | POA: Diagnosis present

## 2020-11-04 DIAGNOSIS — J189 Pneumonia, unspecified organism: Secondary | ICD-10-CM

## 2020-11-04 DIAGNOSIS — E119 Type 2 diabetes mellitus without complications: Secondary | ICD-10-CM

## 2020-11-04 DIAGNOSIS — Z7901 Long term (current) use of anticoagulants: Secondary | ICD-10-CM

## 2020-11-04 LAB — URINALYSIS, COMPLETE (UACMP) WITH MICROSCOPIC
Bacteria, UA: NONE SEEN
Bilirubin Urine: NEGATIVE
Glucose, UA: NEGATIVE mg/dL
Hgb urine dipstick: NEGATIVE
Ketones, ur: 5 mg/dL — AB
Leukocytes,Ua: NEGATIVE
Nitrite: NEGATIVE
Protein, ur: NEGATIVE mg/dL
Specific Gravity, Urine: 1.02 (ref 1.005–1.030)
pH: 5 (ref 5.0–8.0)

## 2020-11-04 LAB — CBC
HCT: 43.4 % (ref 39.0–52.0)
Hemoglobin: 14.5 g/dL (ref 13.0–17.0)
MCH: 30.8 pg (ref 26.0–34.0)
MCHC: 33.4 g/dL (ref 30.0–36.0)
MCV: 92.1 fL (ref 80.0–100.0)
Platelets: 172 10*3/uL (ref 150–400)
RBC: 4.71 MIL/uL (ref 4.22–5.81)
RDW: 14.7 % (ref 11.5–15.5)
WBC: 5.3 10*3/uL (ref 4.0–10.5)
nRBC: 0 % (ref 0.0–0.2)

## 2020-11-04 LAB — CBG MONITORING, ED
Glucose-Capillary: 45 mg/dL — ABNORMAL LOW (ref 70–99)
Glucose-Capillary: 47 mg/dL — ABNORMAL LOW (ref 70–99)
Glucose-Capillary: 228 mg/dL — ABNORMAL HIGH (ref 70–99)

## 2020-11-04 LAB — BASIC METABOLIC PANEL
Anion gap: 12 (ref 5–15)
BUN: 23 mg/dL (ref 8–23)
CO2: 23 mmol/L (ref 22–32)
Calcium: 9.4 mg/dL (ref 8.9–10.3)
Chloride: 103 mmol/L (ref 98–111)
Creatinine, Ser: 1.68 mg/dL — ABNORMAL HIGH (ref 0.61–1.24)
GFR, Estimated: 43 mL/min — ABNORMAL LOW (ref >60)
Glucose, Bld: 148 mg/dL — ABNORMAL HIGH (ref 70–99)
Potassium: 4.5 mmol/L (ref 3.5–5.1)
Sodium: 138 mmol/L (ref 135–145)

## 2020-11-04 LAB — TROPONIN I (HIGH SENSITIVITY): Troponin I (High Sensitivity): 8 ng/L (ref <18)

## 2020-11-04 LAB — GLUCOSE, CAPILLARY: Glucose-Capillary: 208 mg/dL — ABNORMAL HIGH (ref 70–99)

## 2020-11-04 LAB — HEMOGLOBIN A1C
Hgb A1c MFr Bld: 7 % — ABNORMAL HIGH (ref 4.8–5.6)
Mean Plasma Glucose: 154.2 mg/dL

## 2020-11-04 LAB — SARS CORONAVIRUS 2 (TAT 6-24 HRS): SARS Coronavirus 2: NEGATIVE

## 2020-11-04 IMAGING — DX DG CHEST 1V
1 series · 1 of 1 positions shown · non-contrast
Comparison: Radiographs [DATE] and [DATE].

CLINICAL DATA: Syncopal episode.

EXAM:
CHEST  1 VIEW

[chest ap]
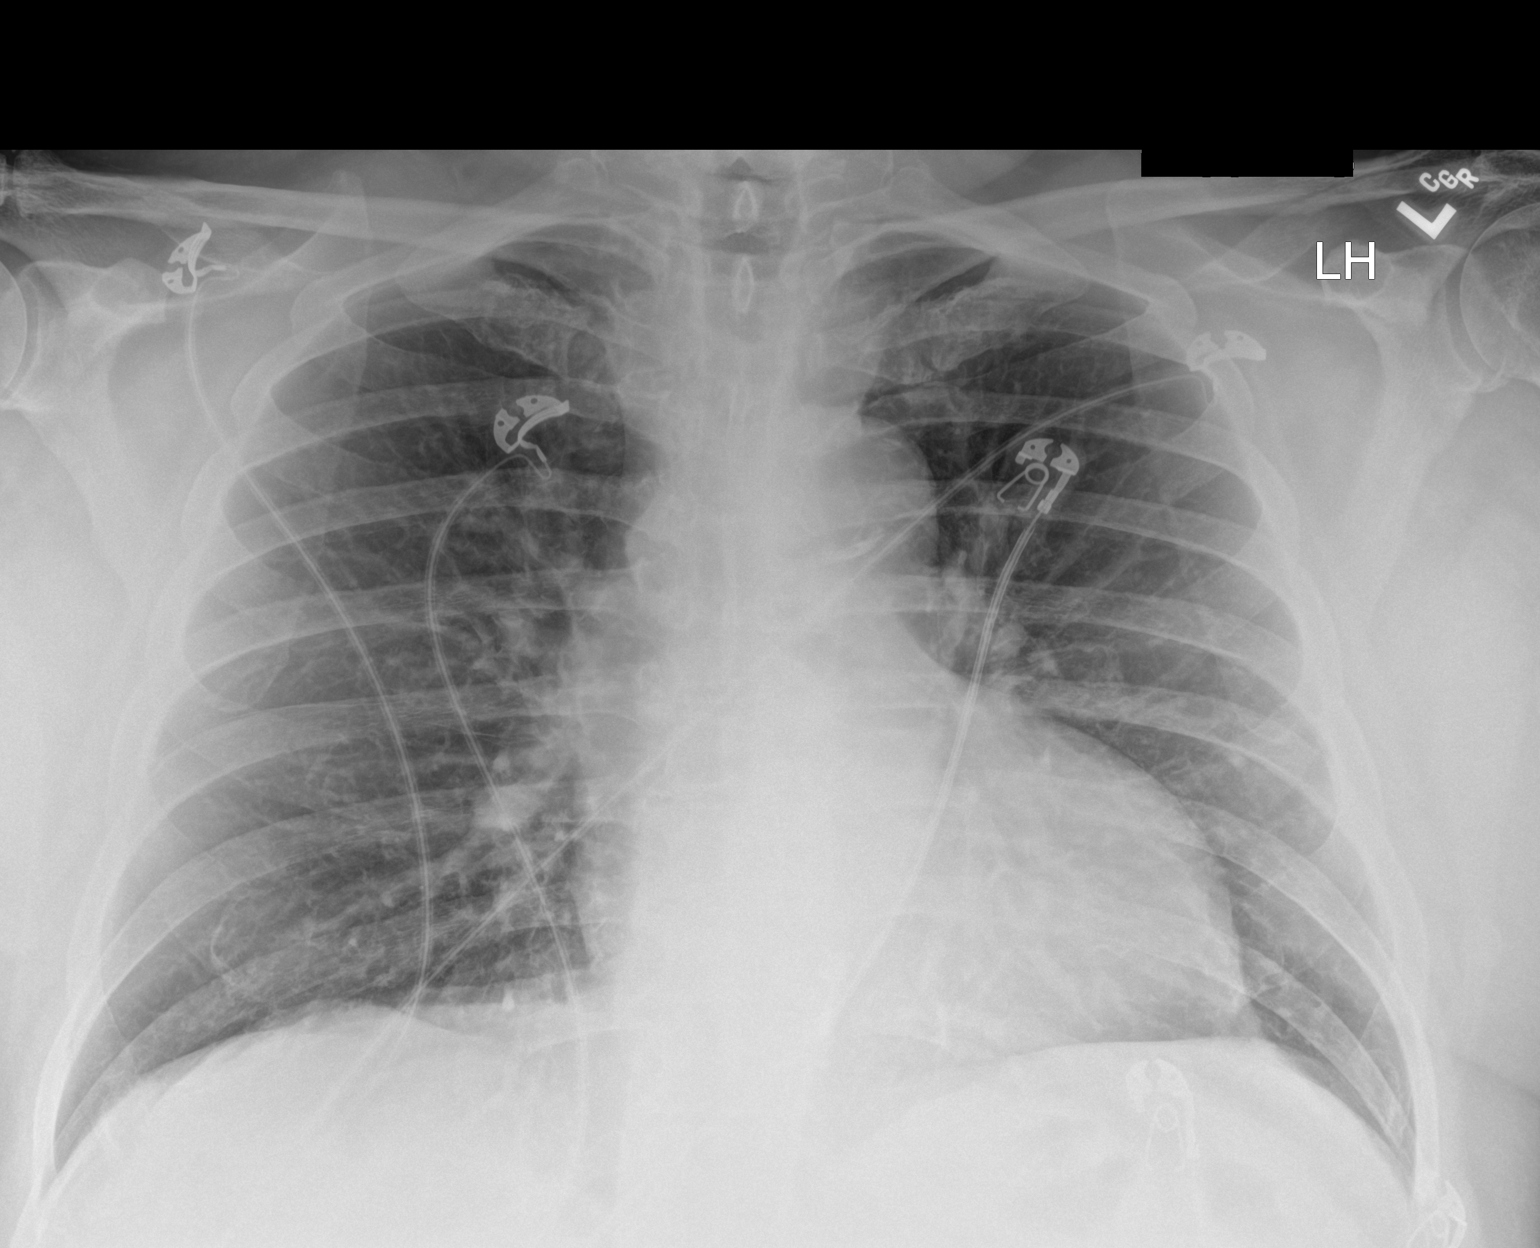

[1 of 1 positions shown; findings below may reference images not displayed]

FINDINGS: [BH] hours. Lordotic positioning. The heart size and mediastinal
contours are stable. The lungs are clear. There is no pleural
effusion or pneumothorax. No acute osseous findings are evident.
Telemetry leads overlie the chest.
IMPRESSION: No active cardiopulmonary process.

## 2020-11-04 MED ORDER — APIXABAN 5 MG PO TABS
5.0000 mg | ORAL_TABLET | Freq: Two times a day (BID) | ORAL | Status: DC
  Filled 2020-11-04: qty 1

## 2020-11-04 MED ORDER — GABAPENTIN 400 MG PO CAPS
400.0000 mg | ORAL_CAPSULE | Freq: Every day | ORAL | Status: DC
  Administered 2020-11-05: 400 mg via ORAL
  Filled 2020-11-04: qty 1

## 2020-11-04 MED ORDER — TAMSULOSIN HCL 0.4 MG PO CAPS
0.4000 mg | ORAL_CAPSULE | Freq: Every day | ORAL | Status: DC
  Administered 2020-11-05: 0.4 mg via ORAL
  Filled 2020-11-04: qty 1

## 2020-11-04 MED ORDER — DEXTROSE 50 % IV SOLN
INTRAVENOUS | Status: AC
  Administered 2020-11-04: 12.5 g via INTRAVENOUS
  Filled 2020-11-04: qty 50

## 2020-11-04 MED ORDER — SPIRONOLACTONE 25 MG PO TABS
25.0000 mg | ORAL_TABLET | Freq: Every day | ORAL | Status: DC
  Administered 2020-11-05: 25 mg via ORAL
  Filled 2020-11-04 (×2): qty 1

## 2020-11-04 MED ORDER — SODIUM CHLORIDE 0.9% FLUSH
3.0000 mL | Freq: Two times a day (BID) | INTRAVENOUS | Status: DC
  Administered 2020-11-04 – 2020-11-05 (×3): 3 mL via INTRAVENOUS

## 2020-11-04 MED ORDER — POLYETHYLENE GLYCOL 3350 17 G PO PACK: 17.0000 g | PACK | Freq: Every day | ORAL | Status: DC | PRN

## 2020-11-04 MED ORDER — FUROSEMIDE 40 MG PO TABS
40.0000 mg | ORAL_TABLET | Freq: Every day | ORAL | Status: DC
  Administered 2020-11-05 – 2020-11-06 (×2): 40 mg via ORAL
  Filled 2020-11-04 (×2): qty 1

## 2020-11-04 MED ORDER — SACUBITRIL-VALSARTAN 24-26 MG PO TABS
1.0000 | ORAL_TABLET | Freq: Two times a day (BID) | ORAL | Status: DC
  Administered 2020-11-04 – 2020-11-06 (×3): 1 via ORAL
  Filled 2020-11-04 (×6): qty 1

## 2020-11-04 MED ORDER — INSULIN NPH (HUMAN) (ISOPHANE) 100 UNIT/ML ~~LOC~~ SUSP
28.0000 [IU] | Freq: Two times a day (BID) | SUBCUTANEOUS | Status: DC
  Filled 2020-11-04 (×2): qty 10

## 2020-11-04 MED ORDER — BUPROPION HCL ER (SR) 150 MG PO TB12
150.0000 mg | ORAL_TABLET | Freq: Two times a day (BID) | ORAL | Status: DC
  Administered 2020-11-05 – 2020-11-06 (×2): 150 mg via ORAL
  Filled 2020-11-04 (×5): qty 1

## 2020-11-04 MED ORDER — ACETAMINOPHEN 650 MG RE SUPP: 650.0000 mg | Freq: Four times a day (QID) | RECTAL | Status: DC | PRN

## 2020-11-04 MED ORDER — DEXTROSE 50 % IV SOLN: 12.5000 g | Freq: Once | INTRAVENOUS | Status: AC

## 2020-11-04 MED ORDER — DILTIAZEM HCL ER COATED BEADS 120 MG PO CP24
120.0000 mg | ORAL_CAPSULE | Freq: Every day | ORAL | Status: DC
  Administered 2020-11-05 – 2020-11-06 (×2): 120 mg via ORAL
  Filled 2020-11-04 (×2): qty 1

## 2020-11-04 MED ORDER — AMIODARONE HCL 200 MG PO TABS
200.0000 mg | ORAL_TABLET | Freq: Every day | ORAL | Status: DC
  Administered 2020-11-04 – 2020-11-06 (×3): 200 mg via ORAL
  Filled 2020-11-04 (×3): qty 1

## 2020-11-04 MED ORDER — DUTASTERIDE 0.5 MG PO CAPS
0.5000 mg | ORAL_CAPSULE | Freq: Every day | ORAL | Status: DC
  Administered 2020-11-05 – 2020-11-06 (×2): 0.5 mg via ORAL
  Filled 2020-11-04 (×2): qty 1

## 2020-11-04 MED ORDER — LACTATED RINGERS IV BOLUS
500.0000 mL | Freq: Once | INTRAVENOUS | Status: AC
  Administered 2020-11-04: 500 mL via INTRAVENOUS

## 2020-11-04 MED ORDER — METOPROLOL SUCCINATE ER 100 MG PO TB24
100.0000 mg | ORAL_TABLET | Freq: Every day | ORAL | Status: DC
  Administered 2020-11-05: 100 mg via ORAL
  Filled 2020-11-04: qty 2
  Filled 2020-11-04: qty 1

## 2020-11-04 MED ORDER — VENLAFAXINE HCL ER 75 MG PO CP24
150.0000 mg | ORAL_CAPSULE | Freq: Every day | ORAL | Status: DC
  Administered 2020-11-05 – 2020-11-06 (×2): 150 mg via ORAL
  Filled 2020-11-04: qty 2
  Filled 2020-11-04: qty 1
  Filled 2020-11-04: qty 2

## 2020-11-04 MED ORDER — PANTOPRAZOLE SODIUM 40 MG PO TBEC
40.0000 mg | DELAYED_RELEASE_TABLET | Freq: Every day | ORAL | Status: DC
  Administered 2020-11-05 – 2020-11-06 (×2): 40 mg via ORAL
  Filled 2020-11-04 (×2): qty 1

## 2020-11-04 MED ORDER — ATORVASTATIN CALCIUM 20 MG PO TABS
40.0000 mg | ORAL_TABLET | Freq: Every day | ORAL | Status: DC
  Administered 2020-11-05 – 2020-11-06 (×2): 40 mg via ORAL
  Filled 2020-11-04 (×2): qty 2

## 2020-11-04 MED ORDER — METHIMAZOLE 5 MG PO TABS
5.0000 mg | ORAL_TABLET | Freq: Every day | ORAL | Status: DC
  Administered 2020-11-05 – 2020-11-06 (×2): 5 mg via ORAL
  Filled 2020-11-04 (×2): qty 1

## 2020-11-04 MED ORDER — ACETAMINOPHEN 325 MG PO TABS: 650.0000 mg | ORAL_TABLET | Freq: Four times a day (QID) | ORAL | Status: DC | PRN

## 2020-11-04 MED ORDER — INSULIN ASPART PROT & ASPART (70-30 MIX) 100 UNIT/ML ~~LOC~~ SUSP: 28.0000 [IU] | Freq: Two times a day (BID) | SUBCUTANEOUS | Status: DC

## 2020-11-04 MED ORDER — INSULIN ASPART 100 UNIT/ML ~~LOC~~ SOLN
0.0000 [IU] | Freq: Three times a day (TID) | SUBCUTANEOUS | Status: DC
  Administered 2020-11-04: 5 [IU] via SUBCUTANEOUS
  Administered 2020-11-05 (×3): 2 [IU] via SUBCUTANEOUS
  Filled 2020-11-04 (×4): qty 1

## 2020-11-04 NOTE — ED Notes (Signed)
Will recheck blood sugar . Pt calm , collective, denies pain or sob. Sitting at bedside eating

## 2020-11-04 NOTE — ED Provider Notes (Signed)
[facility] Emergency Department Provider Note   ____________________________________________   Event Date/Time   First MD Initiated Contact with Patient 11/04/20 1140     (approximate)  I have reviewed the triage vital signs and the nursing notes.   HISTORY  Chief Complaint Loss of Consciousness    HPI Tom Moore is a 75 y.o. male with past medical history of hypertension, hyperlipidemia, diabetes, atrial fibrillation on Eliquis, CKD, and CHF (EF 20 to 25%) who presents to the ED following syncope.  Patient reports that he had an episode on Friday where he woke up on the floor of his bathroom, is unsure how long he was out for.  He does not remember passing out or feeling lightheaded prior to the episode.  He had a similar episode again on Sunday morning, when he remembers coming back from the bathroom but then woke up on the floor.  He denies any chest pain with these episodes but does wake up feeling slightly short of breath, which eases up with some time and rest.  He is otherwise been feeling well with no fevers, cough, nausea, vomiting, or diarrhea.        Past Medical History:  Diagnosis Date  . Anxiety   . Arthritis   . CHF (congestive heart failure) ([place])   . Depression   . Diabetes mellitus without complication ([place])   . Dysrhythmia    atrial fib  . GERD (gastroesophageal reflux disease)   . Hyperlipidemia   . Hypertension   . Hyperthyroidism   . Insomnia   . Neuromuscular disorder ([place])    diabetic neuropathy  . Orthopnea    uses extra pillow to sleep  . Sleep apnea    unable to tolerater CPAP    Patient Active Problem List   Diagnosis Date Noted  . Benign prostatic hyperplasia with lower urinary tract symptoms 08/27/2020  . Screening for colon cancer   . Polyp of colon   . Early satiety   . Non-intractable vomiting   . Stasis dermatitis of both legs 06/09/2018  . Bilateral carpal tunnel syndrome 11/25/2017  . Nocturia  08/17/2017  . CKD stage 3 due to type 2 diabetes mellitus ([place]) 05/05/2017  . Heart failure with reduced ejection fraction ([place]) 11/30/2016  . Paroxysmal atrial fibrillation ([place]) 11/30/2016  . Subclinical hyperthyroidism 11/30/2016  . Insomnia 05/20/2015  . Depression 05/20/2015  . Hyperlipidemia 05/20/2015  . Anxiety 05/20/2015  . Erectile dysfunction 05/20/2015  . Hypertension 05/20/2015  . Controlled diabetes mellitus ([place]) 05/20/2015  . GERD (gastroesophageal reflux disease) 05/20/2015  . Osteoarthritis, knee 05/20/2015    Past Surgical History:  Procedure Laterality Date  . CARDIAC CATHETERIZATION    . CARPAL TUNNEL RELEASE Right 04/10/2018   Procedure: RELEASE MEDIAN NERVE AT CARPAL TUNNEL;  Surgeon: Earnestine Leys, MD;  Location: [facility] ORS;  Service: Orthopedics;  Laterality: Right;  . CARPAL TUNNEL RELEASE Left 05/08/2018   Procedure: CARPAL TUNNEL RELEASE;  Surgeon: Earnestine Leys, MD;  Location: [facility] ORS;  Service: Orthopedics;  Laterality: Left;  . CATARACT EXTRACTION W/PHACO Right 08/16/2019   Procedure: CATARACT EXTRACTION PHACO AND INTRAOCULAR LENS PLACEMENT ([place]) RIGHT;  Surgeon: Marchia Meiers, MD;  Location: [facility] ORS;  Service: Ophthalmology;  Laterality: Right;  [place] 01:30.3 CDE 13.71 Fluid Pack lot # [id number]  . COLONOSCOPY WITH PROPOFOL N/A 06/04/2020   Procedure: COLONOSCOPY WITH PROPOFOL;  Surgeon: Virgel Manifold, MD;  Location: [facility] ENDOSCOPY;  Service: Endoscopy;  Laterality: N/A;  . ESOPHAGOGASTRODUODENOSCOPY (EGD) WITH  PROPOFOL N/A 06/04/2020   Procedure: ESOPHAGOGASTRODUODENOSCOPY (EGD) WITH PROPOFOL;  Surgeon: Virgel Manifold, MD;  Location: [facility] ENDOSCOPY;  Service: Endoscopy;  Laterality: N/A;  . TONSILLECTOMY      Prior to Admission medications   Medication Sig Start Date End Date Taking? Authorizing Provider  atorvastatin (LIPITOR) 40 MG tablet Take 40 mg by mouth daily.    [provider]  buPROPion (WELLBUTRIN SR) 150 MG 12 hr tablet  Take 1 tablet (150 mg total) by mouth 2 (two) times daily. 10/22/20   Jerrol Banana., MD  diltiazem (CARDIZEM CD) 120 MG 24 hr capsule Take 1 capsule (120 mg total) by mouth daily. 08/27/19 07/04/22  Jerrol Banana., MD  dutasteride (AVODART) 0.5 MG capsule Take 1 capsule (0.5 mg total) by mouth daily. 04/10/20   Jerrol Banana., MD  furosemide (LASIX) 40 MG tablet Take 1 tablet (40 mg total) by mouth 2 (two) times daily. Patient taking differently: Take 40 mg by mouth daily. 06/19/18   Mar Daring, PA-C  gabapentin (NEURONTIN) 400 MG capsule Take 400 mg by mouth daily. AT LUNCH     [provider]  glipiZIDE (GLUCOTROL) 10 MG tablet TAKE ONE TABLET BY MOUTH THREE TIMES DAILY BEFORE MEALS Patient taking differently: 10 mg 2 (two) times daily before a meal. 06/19/18   Burnette, Clearnce Sorrel, PA-C  glucose blood test strip Use 3 (three) times daily One touch ultra test strips e11.29 06/02/18   [provider]  insulin NPH Human (HUMULIN N,NOVOLIN N) 100 UNIT/ML injection 28 Units 2 (two) times daily before a meal.  11/01/16   [provider]  meclizine (ANTIVERT) 25 MG tablet TAKE 1 TABLET BY MOUTH THREE TIMES DAILY AS NEEDED FOR  DIZZINESS Patient not taking: Reported on 10/22/2020 07/16/20   Jerrol Banana., MD  metFORMIN (GLUCOPHAGE) 1000 MG tablet TAKE 1 TABLET BY MOUTH TWICE DAILY WITH A MEAL Patient taking differently: daily with breakfast. 04/27/16   Carmon Ginsberg, PA  methimazole (TAPAZOLE) 5 MG tablet Take 5 mg by mouth. 02/15/17   [provider]  metoprolol succinate (TOPROL-XL) 100 MG 24 hr tablet Take 100 mg by mouth daily. 07/19/17   [provider]  multivitamin-iron-minerals-folic acid (THERAPEUTIC-M) TABS tablet Take 1 tablet by mouth daily.    [provider]  Na Sulfate-K Sulfate-Mg Sulf 17.5-3.13-1.6 GM/177ML SOLN At 5 PM the day before procedure take 1 bottle and 5 hours before procedure take 1 bottle.  05/27/20   Virgel Manifold, MD  omeprazole (PRILOSEC) 40 MG capsule Take 1 capsule by mouth once daily 07/16/20   Jerrol Banana., MD  OZEMPIC, 0.25 OR 0.5 MG/DOSE, 2 MG/1.5ML SOPN 1.5 mLs once a week. Patient not taking: Reported on 10/22/2020 12/15/19   [provider]  PACERONE 200 MG tablet Take by mouth. 06/19/20   [provider]  sacubitril-valsartan (ENTRESTO) 24-26 MG Take by mouth. 06/03/20   [provider]  sertraline (ZOLOFT) 100 MG tablet Take 1 tablet by mouth once daily 09/26/20   Jerrol Banana., MD  spironolactone (ALDACTONE) 25 MG tablet Take 25 mg by mouth daily.  06/25/19   [provider]  sucralfate (CARAFATE) 1 g tablet TAKE 1 TABLET BY MOUTH THREE TIMES DAILY WITH MEALS 09/26/20   Jerrol Banana., MD  tamsulosin (FLOMAX) 0.4 MG CAPS capsule TAKE 2 CAPSULES BY MOUTH ONCE DAILY WITH LUNCH 07/16/20   Stoioff, Ronda Fairly, MD  triamcinolone cream (  KENALOG) 0.1 % Apply 0.1 application topically 1 day or 1 dose. Patient not taking: No sig reported    [provider]  TRULICITY 1.5 DV/7.6HY SOPN Inject into the skin once a week.    [provider]  venlafaxine XR (EFFEXOR-XR) 150 MG 24 hr capsule TAKE 1 CAPSULE BY MOUTH ONCE DAILY WITH BREAKFAST 08/29/20   Jerrol Banana., MD    Allergies Zyrtec allergy [cetirizine]  Family History  Problem Relation Age of Onset  . Hypertension Mother   . Heart disease Mother     Social History Social History   Tobacco Use  . Smoking status: Former Smoker    Quit date: 04/03/1988    Years since quitting: 32.6  . Smokeless tobacco: Never Used  Vaping Use  . Vaping Use: Never used  Substance Use Topics  . Alcohol use: Not Currently    Comment: Ocassional alcohol use, Drinks wine.  . Drug use: Never    Review of Systems  Constitutional: No fever/chills Eyes: No visual changes. ENT: No sore throat. Cardiovascular: Denies chest pain.  Positive for  syncope. Respiratory: Positive for shortness of breath. Gastrointestinal: No abdominal pain.  No nausea, no vomiting.  No diarrhea.  No constipation. Genitourinary: Negative for dysuria. Musculoskeletal: Negative for back pain. Skin: Negative for rash. Neurological: Negative for headaches, focal weakness or numbness.  ____________________________________________   PHYSICAL EXAM:  VITAL SIGNS: ED Triage Vitals [11/04/20 1051]  Enc Vitals Group     BP 139/85     Pulse Rate 87     Resp 16     Temp 97.6 F (36.4 C)     Temp Source Oral     SpO2 99 %     Weight 233 lb 0.4 oz (105.7 kg)     Height 5\' 9"  (1.753 m)     Head Circumference      Peak Flow      Pain Score 0     Pain Loc      Pain Edu?      Excl. in [place]?     Constitutional: Alert and oriented. Eyes: Conjunctivae are normal. Head: Atraumatic. Nose: No congestion/rhinnorhea. Mouth/Throat: Mucous membranes are moist. Neck: Normal ROM Cardiovascular: Normal rate, regular rhythm. Grossly normal heart sounds.  2+ radial pulses bilaterally. Respiratory: Normal respiratory effort.  No retractions. Lungs CTAB. Gastrointestinal: Soft and nontender. No distention. Genitourinary: deferred Musculoskeletal: No lower extremity tenderness nor edema. Neurologic:  Normal speech and language. No gross focal neurologic deficits are appreciated. Skin:  Skin is warm, dry and intact. No rash noted. Psychiatric: Mood and affect are normal. Speech and behavior are normal.  ____________________________________________   LABS (all labs ordered are listed, but only abnormal results are displayed)  Labs Reviewed  BASIC METABOLIC PANEL - Abnormal; Notable for the following components:      Result Value   Glucose, Bld 148 (*)    Creatinine, Ser 1.68 (*)    GFR, Estimated 43 (*)    All other components within normal limits  URINALYSIS, COMPLETE (UACMP) WITH MICROSCOPIC - Abnormal; Notable for the following components:   Color, Urine  YELLOW (*)    APPearance HAZY (*)    Ketones, ur 5 (*)    All other components within normal limits  SARS CORONAVIRUS 2 (TAT 6-24 HRS)  CBC  CBG MONITORING, ED  TROPONIN I (HIGH SENSITIVITY)   ____________________________________________  EKG  ED ECG REPORT I, Blake Divine, the attending physician, personally viewed and interpreted this ECG.  Date: 11/04/2020  EKG Time: 10:46  Rate: 90  Rhythm: normal sinus rhythm  Axis: RAD  Intervals:none  ST&T Change: None   PROCEDURES  Procedure(s) performed (including Critical Care):  .1-3 Lead EKG Interpretation Performed by: Blake Divine, MD Authorized by: Blake Divine, MD     Interpretation: normal     ECG rate:  75   ECG rate assessment: normal     Rhythm: sinus rhythm     Ectopy: none     Conduction: normal       ____________________________________________   INITIAL IMPRESSION / ASSESSMENT AND PLAN / ED COURSE       75 year old male with past medical history of hypertension, hyperlipidemia, diabetes, CHF, atrial fibrillation on Eliquis, and CKD who presents to the ED for 2 separate syncopal episodes over the past 4 days.  He does not have any prodromal symptoms with these episodes but does wake up with some shortness of breath.  Given his history of CHF and lack of prodromal symptoms, I am concerned for potential cardiac cause of his syncope.  EKG shows normal sinus rhythm with no ischemic changes, initial labs are unremarkable and we will add on troponin.  Anticipate admission for further syncopal work-up.  Troponin within normal limits and patient remains in normal sinus rhythm.  Given his cardiac history with unprovoked syncopal episodes, case discussed with hospitalist for admission.      ____________________________________________   FINAL CLINICAL IMPRESSION(S) / ED DIAGNOSES  Final diagnoses:  Syncope and collapse     ED Discharge Orders    None       Note:  This document was prepared  using Dragon voice recognition software and may include unintentional dictation errors.   Blake Divine, MD 11/04/20 1316

## 2020-11-04 NOTE — ED Notes (Signed)
Sent message to hospitalist [facility] regarding blood sugar

## 2020-11-04 NOTE — ED Notes (Addendum)
Called lab they advised covid is a send out to [facility]

## 2020-11-04 NOTE — ED Triage Notes (Signed)
C/O syncope Sunday morning at 0500.  States had gotten out of bed to go to the bathroom.  States on his way back from the bathroom, was syncopal. Also reports an episode of syncope on Saturday.  States was getting of the couch to go to the kitchen when event occurred. Also C/O legs shaking since last Wednesday.  AAOx3.  Skin warm and dry. MAE equally and strong.  NAD

## 2020-11-04 NOTE — ED Notes (Signed)
ED Provider at bedside. 

## 2020-11-04 NOTE — ED Notes (Signed)
Called and advised pt Meal tray has not arrived

## 2020-11-04 NOTE — ED Notes (Signed)
Family at bedside. 

## 2020-11-04 NOTE — Consult Note (Addendum)
ANTICOAGULATION CONSULT NOTE - Initial Consult  Pharmacy Consult for Apixaban initiation Indication: atrial fibrillation  Allergies  Allergen Reactions  . Zyrtec Allergy [Cetirizine] Diarrhea    Patient Measurements: Height: 5\' 9"  (175.3 cm) Weight: 105.7 kg (233 lb 0.4 oz) IBW/kg (Calculated) : 70.7  Vital Signs: Temp: 97.6 F (36.4 C) (02/08 1051) Temp Source: Oral (02/08 1051) BP: 115/72 (02/08 1330) Pulse Rate: 75 (02/08 1330)  Labs: Recent Labs    11/04/20 1054  HGB 14.5  HCT 43.4  PLT 172  CREATININE 1.68*  TROPONINIHS 8    Estimated Creatinine Clearance: 46.9 mL/min (A) (by C-G formula based on SCr of 1.68 mg/dL (H)).   Medical History: Past Medical History:  Diagnosis Date  . Anxiety   . Arthritis   . CHF (congestive heart failure) ([place])   . Depression   . Diabetes mellitus without complication ([place])   . Dysrhythmia    atrial fib  . GERD (gastroesophageal reflux disease)   . Hyperlipidemia   . Hypertension   . Hyperthyroidism   . Insomnia   . Neuromuscular disorder ([place])    diabetic neuropathy  . Orthopnea    uses extra pillow to sleep  . Sleep apnea    unable to tolerater CPAP    Medications:  (Not in a hospital admission)  Scheduled:  . amiodarone  200 mg Oral Daily  . apixaban  5 mg Oral BID  . [START ON 11/05/2020] atorvastatin  40 mg Oral Daily  . [START ON 11/05/2020] diltiazem  120 mg Oral Daily  . [START ON 11/05/2020] furosemide  40 mg Oral Daily  . insulin aspart  0-15 Units Subcutaneous TID AC & HS  . insulin NPH Human  28 Units Subcutaneous BID AC & HS  . [START ON 11/05/2020] methimazole  5 mg Oral Daily  . metoprolol succinate  100 mg Oral Daily  . [START ON 11/05/2020] pantoprazole  40 mg Oral Daily  . sacubitril-valsartan  1 tablet Oral BID  . sodium chloride flush  3 mL Intravenous Q12H  . spironolactone  25 mg Oral Daily  . [START ON 11/05/2020] tamsulosin  0.4 mg Oral Q lunch    Assessment: Pt is a 75 y.o. male presenting  to the ED due to multiple syncopal events in the past few days with PMH of A.fib. He denies any chest pain with these episodes but does wake up feeling slightly short of breath, which eases up with some time and rest.  He is otherwise been feeling well with no fevers, cough, nausea, vomiting, or diarrhea.  Goal of Therapy:  Monitor CBCs every 72 hours while admitted per protocol Monitor platelets by anticoagulation protocol: Yes   Plan:  Initiate Apixaban 5mg  BID According to past medical notes, pt has medication non-adherence, additional counseling would be beneficial.  Chrystie Nose, PharmD Student 11/04/2020,2:24 PM

## 2020-11-05 DIAGNOSIS — E782 Mixed hyperlipidemia: Secondary | ICD-10-CM | POA: Diagnosis present

## 2020-11-05 DIAGNOSIS — R55 Syncope and collapse: Secondary | ICD-10-CM

## 2020-11-05 DIAGNOSIS — Z7984 Long term (current) use of oral hypoglycemic drugs: Secondary | ICD-10-CM | POA: Diagnosis not present

## 2020-11-05 DIAGNOSIS — E11649 Type 2 diabetes mellitus with hypoglycemia without coma: Secondary | ICD-10-CM | POA: Diagnosis present

## 2020-11-05 DIAGNOSIS — N1832 Chronic kidney disease, stage 3b: Secondary | ICD-10-CM | POA: Diagnosis present

## 2020-11-05 DIAGNOSIS — Z87891 Personal history of nicotine dependence: Secondary | ICD-10-CM | POA: Diagnosis not present

## 2020-11-05 DIAGNOSIS — I130 Hypertensive heart and chronic kidney disease with heart failure and stage 1 through stage 4 chronic kidney disease, or unspecified chronic kidney disease: Secondary | ICD-10-CM | POA: Diagnosis present

## 2020-11-05 DIAGNOSIS — N401 Enlarged prostate with lower urinary tract symptoms: Secondary | ICD-10-CM | POA: Diagnosis present

## 2020-11-05 DIAGNOSIS — I5022 Chronic systolic (congestive) heart failure: Secondary | ICD-10-CM | POA: Diagnosis present

## 2020-11-05 DIAGNOSIS — Z8249 Family history of ischemic heart disease and other diseases of the circulatory system: Secondary | ICD-10-CM | POA: Diagnosis not present

## 2020-11-05 DIAGNOSIS — Z20822 Contact with and (suspected) exposure to covid-19: Secondary | ICD-10-CM | POA: Diagnosis present

## 2020-11-05 DIAGNOSIS — K219 Gastro-esophageal reflux disease without esophagitis: Secondary | ICD-10-CM | POA: Diagnosis present

## 2020-11-05 DIAGNOSIS — E1122 Type 2 diabetes mellitus with diabetic chronic kidney disease: Secondary | ICD-10-CM | POA: Diagnosis present

## 2020-11-05 DIAGNOSIS — I951 Orthostatic hypotension: Secondary | ICD-10-CM | POA: Diagnosis present

## 2020-11-05 DIAGNOSIS — Z794 Long term (current) use of insulin: Secondary | ICD-10-CM | POA: Diagnosis not present

## 2020-11-05 DIAGNOSIS — Z79899 Other long term (current) drug therapy: Secondary | ICD-10-CM | POA: Diagnosis not present

## 2020-11-05 DIAGNOSIS — I480 Paroxysmal atrial fibrillation: Secondary | ICD-10-CM

## 2020-11-05 DIAGNOSIS — Z7901 Long term (current) use of anticoagulants: Secondary | ICD-10-CM | POA: Diagnosis not present

## 2020-11-05 LAB — COMPREHENSIVE METABOLIC PANEL
ALT: 52 U/L — ABNORMAL HIGH (ref 0–44)
AST: 37 U/L (ref 15–41)
Albumin: 3.9 g/dL (ref 3.5–5.0)
Alkaline Phosphatase: 70 U/L (ref 38–126)
Anion gap: 10 (ref 5–15)
BUN: 20 mg/dL (ref 8–23)
CO2: 23 mmol/L (ref 22–32)
Calcium: 8.9 mg/dL (ref 8.9–10.3)
Chloride: 105 mmol/L (ref 98–111)
Creatinine, Ser: 1.46 mg/dL — ABNORMAL HIGH (ref 0.61–1.24)
GFR, Estimated: 50 mL/min — ABNORMAL LOW (ref >60)
Glucose, Bld: 67 mg/dL — ABNORMAL LOW (ref 70–99)
Potassium: 4 mmol/L (ref 3.5–5.1)
Sodium: 138 mmol/L (ref 135–145)
Total Bilirubin: 0.8 mg/dL (ref 0.3–1.2)
Total Protein: 7 g/dL (ref 6.5–8.1)

## 2020-11-05 LAB — CBC WITH DIFFERENTIAL/PLATELET
Abs Immature Granulocytes: 0.02 10*3/uL (ref 0.00–0.07)
Basophils Absolute: 0 10*3/uL (ref 0.0–0.1)
Basophils Relative: 0 %
Eosinophils Absolute: 0.2 10*3/uL (ref 0.0–0.5)
Eosinophils Relative: 5 %
HCT: 40.1 % (ref 39.0–52.0)
Hemoglobin: 13.5 g/dL (ref 13.0–17.0)
Immature Granulocytes: 0 %
Lymphocytes Relative: 26 %
Lymphs Abs: 1.3 10*3/uL (ref 0.7–4.0)
MCH: 30.8 pg (ref 26.0–34.0)
MCHC: 33.7 g/dL (ref 30.0–36.0)
MCV: 91.3 fL (ref 80.0–100.0)
Monocytes Absolute: 0.5 10*3/uL (ref 0.1–1.0)
Monocytes Relative: 9 %
Neutro Abs: 3.1 10*3/uL (ref 1.7–7.7)
Neutrophils Relative %: 60 %
Platelets: 143 10*3/uL — ABNORMAL LOW (ref 150–400)
RBC: 4.39 MIL/uL (ref 4.22–5.81)
RDW: 14.7 % (ref 11.5–15.5)
WBC: 5.1 10*3/uL (ref 4.0–10.5)
nRBC: 0 % (ref 0.0–0.2)

## 2020-11-05 LAB — ECHOCARDIOGRAM COMPLETE
Area-P 1/2: 2.48 cm2
Height: 69 in
S' Lateral: 3 cm
Weight: 3728.42 oz

## 2020-11-05 LAB — MAGNESIUM: Magnesium: 2 mg/dL (ref 1.7–2.4)

## 2020-11-05 LAB — TROPONIN I (HIGH SENSITIVITY)
Troponin I (High Sensitivity): 12 ng/L (ref <18)
Troponin I (High Sensitivity): 10 ng/L (ref <18)

## 2020-11-05 LAB — GLUCOSE, CAPILLARY
Glucose-Capillary: 71 mg/dL (ref 70–99)
Glucose-Capillary: 156 mg/dL — ABNORMAL HIGH (ref 70–99)
Glucose-Capillary: 74 mg/dL (ref 70–99)
Glucose-Capillary: 139 mg/dL — ABNORMAL HIGH (ref 70–99)
Glucose-Capillary: 110 mg/dL — ABNORMAL HIGH (ref 70–99)
Glucose-Capillary: 124 mg/dL — ABNORMAL HIGH (ref 70–99)
Glucose-Capillary: 141 mg/dL — ABNORMAL HIGH (ref 70–99)

## 2020-11-05 MED ORDER — LACTATED RINGERS IV SOLN: INTRAVENOUS | Status: DC

## 2020-11-05 MED ORDER — LACTATED RINGERS IV SOLN: INTRAVENOUS | Status: AC

## 2020-11-05 NOTE — H&P (Addendum)
History and Physical    Tom Moore JOA:[id number] DOB: 1946-01-30 DOA: 11/04/2020  PCP: Jerrol Banana., MD  Patient coming from: Home   Chief Complaint:  Chief Complaint  Patient presents with  . Loss of Consciousness     HPI:    75 year old male with past medical history of systolic congestive heart failure  (EF 20-25%), paroxysmal atrial fibrillation (not currently on anticoagulation), hyperlipidemia, insulin-dependent diabetes mellitus type 2, gastroesophageal reflux disease, chronic kidney disease stage IIIb who presents to [facility] emergency department after experiencing an episode of loss of consciousness.  Patient explains that this past Friday he was sitting on the couch watching TV.  He stood up to walk to the kitchen when shortly after rising from a seated position he suddenly lost consciousness.  Patient awoke after an unspecified amount of time.  Patient denies any biting of the tongue self urination or self defecation.  2 days later, on Sunday patient states that he woke up at 5 AM and got out of bed to walk to the bathroom when he once again lost consciousness.  Once again the patient woke up after an unspecified amount of time without any evidence of tongue biting self urination or self defecation.  Patient then decided to present to [facility] emergency department for evaluation.  Upon evaluation in the emergency department, initial work-up was unremarkable.  Due to patient's known history of advanced systolic congestive heart failure with significantly depressed EF the hospitalist group was then called to assess the patient for admission to the hospital.  Review of Systems:   Review of Systems  Neurological: Positive for loss of consciousness.  All other systems reviewed and are negative.   Past Medical History:  Diagnosis Date  . Anxiety   . Arthritis   . CHF (congestive heart failure) ([place])   . Depression   . Diabetes mellitus without complication ([place])    . Dysrhythmia    atrial fib  . GERD (gastroesophageal reflux disease)   . Hyperlipidemia   . Hypertension   . Hyperthyroidism   . Insomnia   . Neuromuscular disorder ([place])    diabetic neuropathy  . Orthopnea    uses extra pillow to sleep  . Sleep apnea    unable to tolerater CPAP    Past Surgical History:  Procedure Laterality Date  . CARDIAC CATHETERIZATION    . CARPAL TUNNEL RELEASE Right 04/10/2018   Procedure: RELEASE MEDIAN NERVE AT CARPAL TUNNEL;  Surgeon: Earnestine Leys, MD;  Location: [facility] ORS;  Service: Orthopedics;  Laterality: Right;  . CARPAL TUNNEL RELEASE Left 05/08/2018   Procedure: CARPAL TUNNEL RELEASE;  Surgeon: Earnestine Leys, MD;  Location: [facility] ORS;  Service: Orthopedics;  Laterality: Left;  . CATARACT EXTRACTION W/PHACO Right 08/16/2019   Procedure: CATARACT EXTRACTION PHACO AND INTRAOCULAR LENS PLACEMENT ([place]) RIGHT;  Surgeon: Marchia Meiers, MD;  Location: [facility] ORS;  Service: Ophthalmology;  Laterality: Right;  [place] 01:30.3 CDE 13.71 Fluid Pack lot # [id number]  . COLONOSCOPY WITH PROPOFOL N/A 06/04/2020   Procedure: COLONOSCOPY WITH PROPOFOL;  Surgeon: Virgel Manifold, MD;  Location: [facility] ENDOSCOPY;  Service: Endoscopy;  Laterality: N/A;  . ESOPHAGOGASTRODUODENOSCOPY (EGD) WITH PROPOFOL N/A 06/04/2020   Procedure: ESOPHAGOGASTRODUODENOSCOPY (EGD) WITH PROPOFOL;  Surgeon: Virgel Manifold, MD;  Location: [facility] ENDOSCOPY;  Service: Endoscopy;  Laterality: N/A;  . TONSILLECTOMY       reports that he quit smoking about 32 years ago. He has never used smokeless tobacco. He reports previous alcohol use. He reports  that he does not use drugs.  Allergies  Allergen Reactions  . Zyrtec Allergy [Cetirizine] Diarrhea    Family History  Problem Relation Age of Onset  . Hypertension Mother   . Heart disease Mother      Prior to Admission medications   Medication Sig Start Date End Date Taking? Authorizing Provider  atorvastatin (LIPITOR) 40 MG tablet Take 40  mg by mouth daily.   Yes [provider]  buPROPion (WELLBUTRIN SR) 150 MG 12 hr tablet Take 1 tablet (150 mg total) by mouth 2 (two) times daily. 10/22/20  Yes Jerrol Banana., MD  diltiazem (CARDIZEM CD) 120 MG 24 hr capsule Take 1 capsule (120 mg total) by mouth daily. 08/27/19 07/04/22 Yes Jerrol Banana., MD  dutasteride (AVODART) 0.5 MG capsule Take 1 capsule (0.5 mg total) by mouth daily. 04/10/20  Yes Jerrol Banana., MD  furosemide (LASIX) 40 MG tablet Take 1 tablet (40 mg total) by mouth 2 (two) times daily. Patient taking differently: Take 40 mg by mouth daily. 06/19/18  Yes Mar Daring, PA-C  gabapentin (NEURONTIN) 400 MG capsule Take 400 mg by mouth daily. AT LUNCH    Yes [provider]  glipiZIDE (GLUCOTROL) 10 MG tablet TAKE ONE TABLET BY MOUTH THREE TIMES DAILY BEFORE MEALS Patient taking differently: 10 mg 2 (two) times daily before a meal. 06/19/18  Yes Burnette, Jennifer M, PA-C  insulin NPH Human (HUMULIN N,NOVOLIN N) 100 UNIT/ML injection 28 Units 2 (two) times daily before a meal.  11/01/16  Yes [provider]  meclizine (ANTIVERT) 25 MG tablet TAKE 1 TABLET BY MOUTH THREE TIMES DAILY AS NEEDED FOR  DIZZINESS 07/16/20  Yes Jerrol Banana., MD  metFORMIN (GLUCOPHAGE) 1000 MG tablet TAKE 1 TABLET BY MOUTH TWICE DAILY WITH A MEAL Patient taking differently: daily with breakfast. 04/27/16  Yes Carmon Ginsberg, PA  methimazole (TAPAZOLE) 5 MG tablet Take 5 mg by mouth. 02/15/17  Yes [provider]  metoprolol succinate (TOPROL-XL) 100 MG 24 hr tablet Take 100 mg by mouth daily. 07/19/17  Yes [provider]  multivitamin-iron-minerals-folic acid (THERAPEUTIC-M) TABS tablet Take 1 tablet by mouth daily.   Yes [provider]  omeprazole (PRILOSEC) 40 MG capsule Take 1 capsule by mouth once daily 07/16/20  Yes Jerrol Banana., MD  PACERONE 200 MG tablet Take by mouth. 06/19/20  Yes [provider]  sacubitril-valsartan (ENTRESTO) 24-26 MG Take by mouth. 06/03/20  Yes [provider]  sertraline (ZOLOFT) 100 MG tablet Take 1 tablet by mouth once daily Patient taking differently: Take 100 mg by mouth daily. 09/26/20  Yes Jerrol Banana., MD  spironolactone (ALDACTONE) 25 MG tablet Take 25 mg by mouth daily.  06/25/19  Yes [provider]  sucralfate (CARAFATE) 1 g tablet TAKE 1 TABLET BY MOUTH THREE TIMES DAILY WITH MEALS Patient taking differently: Take 1 g by mouth 4 (four) times daily -  with meals and at bedtime. 09/26/20  Yes Jerrol Banana., MD  tamsulosin (FLOMAX) 0.4 MG CAPS capsule TAKE 2 CAPSULES BY MOUTH ONCE DAILY WITH LUNCH Patient taking differently: Take 0.8 mg by mouth daily with lunch. 07/16/20  Yes Stoioff, Ronda Fairly, MD  TRULICITY 1.5 JE/5.6DJ SOPN Inject into the skin once a week.   Yes [provider]  venlafaxine XR (EFFEXOR-XR) 150 MG 24 hr capsule TAKE 1 CAPSULE BY MOUTH ONCE DAILY WITH BREAKFAST Patient taking differently: Take 150 mg  by mouth daily with breakfast. 08/29/20  Yes Jerrol Banana., MD  glucose blood test strip Use 3 (three) times daily One touch ultra test strips e11.29 06/02/18   [provider]  Na Sulfate-K Sulfate-Mg Sulf 17.5-3.13-1.6 GM/177ML SOLN At 5 PM the day before procedure take 1 bottle and 5 hours before procedure take 1 bottle. Patient not taking: No sig reported 05/27/20   Vonda Antigua B, MD  OZEMPIC, 0.25 OR 0.5 MG/DOSE, 2 MG/1.5ML SOPN 1.5 mLs once a week. Patient not taking: No sig reported 12/15/19   [provider]  triamcinolone cream (KENALOG) 0.1 % Apply 0.1 application topically 1 day or 1 dose. Patient not taking: No sig reported    [provider]    Physical Exam: Vitals:   11/04/20 1630 11/04/20 1819 11/04/20 1904 11/04/20 2001  BP: 120/71 115/63 (!) 142/66 106/61  Pulse: 78 83 81 74  Resp: 18 16 20 20   Temp:   97.8 F (36.6 C) 98.7  F (37.1 C)  TempSrc:   Oral Oral  SpO2: 97% 93% 93% 96%  Weight:      Height:        Constitutional: Acute alert and oriented x3, no associated distress.   Skin: no rashes, no lesions, good skin turgor noted. Eyes: Pupils are equally reactive to light.  No evidence of scleral icterus or conjunctival pallor.  ENMT: Moist mucous membranes noted.  Posterior pharynx clear of any exudate or lesions.   Neck: normal, supple, no masses, no thyromegaly.  No evidence of jugular venous distension.   Respiratory: clear to auscultation bilaterally, no wheezing, no crackles. Normal respiratory effort. No accessory muscle use.  Cardiovascular: Regular rate and rhythm, no murmurs / rubs / gallops. No extremity edema. 2+ pedal pulses. No carotid bruits.  Chest:   Nontender without crepitus or deformity.   Back:   Nontender without crepitus or deformity. Abdomen: Abdomen is soft and nontender.  No evidence of intra-abdominal masses.  Positive bowel sounds noted in all quadrants.   Musculoskeletal: No joint deformity upper and lower extremities. Good ROM, no contractures. Normal muscle tone.  Neurologic: CN 2-12 grossly intact. Sensation intact.  Patient moving all 4 extremities spontaneously.  Patient is following all commands.  Patient is responsive to verbal stimuli.   Psychiatric: Patient exhibits normal mood with appropriate affect.  Patient seems to possess insight as to their current situation.     Labs on Admission: I have personally reviewed following labs and imaging studies -   CBC: Recent Labs  Lab 11/04/20 1054  WBC 5.3  HGB 14.5  HCT 43.4  MCV 92.1  PLT 941   Basic Metabolic Panel: Recent Labs  Lab 11/04/20 1054  NA 138  K 4.5  CL 103  CO2 23  GLUCOSE 148*  BUN 23  CREATININE 1.68*  CALCIUM 9.4   GFR: Estimated Creatinine Clearance: 46.9 mL/min (A) (by C-G formula based on SCr of 1.68 mg/dL (H)). Liver Function Tests: No results for input(s): AST, ALT, ALKPHOS, BILITOT,  PROT, ALBUMIN in the last 168 hours. No results for input(s): LIPASE, AMYLASE in the last 168 hours. No results for input(s): AMMONIA in the last 168 hours. Coagulation Profile: No results for input(s): INR, PROTIME in the last 168 hours. Cardiac Enzymes: No results for input(s): CKTOTAL, CKMB, CKMBINDEX, TROPONINI in the last 168 hours. BNP (last 3 results) No results for input(s): PROBNP in the last 8760 hours. HbA1C: Recent Labs    11/04/20 1054  HGBA1C  7.0*   CBG: Recent Labs  Lab 11/04/20 1749 11/04/20 1758 11/04/20 1832 11/04/20 2006  GLUCAP 45* 47* 228* 208*   Lipid Profile: No results for input(s): CHOL, HDL, LDLCALC, TRIG, CHOLHDL, LDLDIRECT in the last 72 hours. Thyroid Function Tests: No results for input(s): TSH, T4TOTAL, FREET4, T3FREE, THYROIDAB in the last 72 hours. Anemia Panel: No results for input(s): VITAMINB12, FOLATE, FERRITIN, TIBC, IRON, RETICCTPCT in the last 72 hours. Urine analysis:    Component Value Date/Time   COLORURINE YELLOW (A) 11/04/2020 1210   APPEARANCEUR HAZY (A) 11/04/2020 1210   APPEARANCEUR Hazy (A) 08/27/2020 1503   LABSPEC 1.020 11/04/2020 1210   LABSPEC 1.013 01/01/2014 1619   PHURINE 5.0 11/04/2020 1210   GLUCOSEU NEGATIVE 11/04/2020 1210   GLUCOSEU >=500 01/01/2014 1619   HGBUR NEGATIVE 11/04/2020 1210   BILIRUBINUR NEGATIVE 11/04/2020 1210   BILIRUBINUR Negative 08/27/2020 1503   BILIRUBINUR Negative 01/01/2014 1619   KETONESUR 5 (A) 11/04/2020 1210   PROTEINUR NEGATIVE 11/04/2020 1210   UROBILINOGEN 0.2 07/05/2019 1020   NITRITE NEGATIVE 11/04/2020 1210   LEUKOCYTESUR NEGATIVE 11/04/2020 1210   LEUKOCYTESUR Negative 01/01/2014 1619    Radiological Exams on Admission - Personally Reviewed: DG Chest 1 View  Result Date: 11/04/2020 CLINICAL DATA:  Syncopal episode. EXAM: CHEST  1 VIEW COMPARISON:  Radiographs 09/08/2017 and 12/18/2015. FINDINGS: 1400 hours. Lordotic positioning. The heart size and mediastinal contours  are stable. The lungs are clear. There is no pleural effusion or pneumothorax. No acute osseous findings are evident. Telemetry leads overlie the chest. IMPRESSION: No active cardiopulmonary process. Electronically Signed   By: Richardean Sale M.D.   On: 11/04/2020 14:37    EKG: Personally reviewed.  Rhythm is sinus rhythm with heart rate of 90 bpm.  Slightly prolonged QTc of 43ms.  no dynamic ST segment changes appreciated.  Assessment/Plan Principal Problem:   Syncope   Patient presenting with 2 episodes of syncope over the span of 3 days.  Each episode of syncope seems to have occurred shortly after rising received a session suggestive of orthostatic syncope.  That being said, patient does have a known history of advanced systolic congestive heart failure with a significantly depressed ejection fraction of 20 to 25% per review of cardiology notes placing patient at high risk of cardiac arrhythmias  Monitoring patient on telemetry  Cycling cardiac enzymes  Obtaining orthostatic vital signs  Obtaining echocardiogram  Will monitor for bouts of hypotension on current antihypertensive and diuretic regimen  PT evaluation  Patient may benefit from outpatient event monitor at time of discharge  Active Problems:   Mixed hyperlipidemia   Continue home regimen of statin therapy    Essential hypertension   Continue home antihypertensive regimen    Type 2 diabetes mellitus without complication, with long-term current use of insulin (HCC)   Holding home regimen of scheduled insulin therapy due to bout of hypoglycemia in the emergency department  Accu-Cheks before every meal and nightly with sliding scale insulin  Obtaining hemoglobin A1c  Holding home regimen of oral hypoglycemics    AF (paroxysmal atrial fibrillation) (HCC)   Currently rate controlled  Continuing home regimen of diltiazem  Continuing home regimen of Pacerone  Monitoring on telemetry  Patient not  currently on anticoagulation at home but has been on Eliquis in the past    Chronic kidney disease, stage 3b (HCC)   Creatinine near baseline  Strict input and output monitoring  Limiting nephrotoxic agents as much as possible  Monitoring renal function and  electrolytes with serial chemistries    GERD without esophagitis   Continue home regimen of PPI    Benign prostatic hyperplasia with lower urinary tract symptoms   Continue home regimen of tamsulosin     Code Status:  Full code Family Communication: Deferred  Status is: Observation  The patient remains OBS appropriate and will d/c before 2 midnights.  Dispo: The patient is from: Home              Anticipated d/c is to: Home              Anticipated d/c date is: 2 days              Patient currently is not medically stable to d/c.   Difficult to place patient No        Vernelle Emerald MD [facility] Pager [phone]  If 7PM-7AM, please contact night-coverage [email or web address] Use universal [place] password for that web site. If you do not have the password, please call the hospital operator.  11/05/2020, 12:11 AM

## 2020-11-05 NOTE — Progress Notes (Signed)
Orthostatic vitals taken in PT evaluation.    11/05/20 1103  Therapy Vitals  Patient Position (if appropriate) Orthostatic Vitals  Orthostatic Lying   BP- Lying 143/76  Pulse- Lying 66  Orthostatic Sitting 1 minute  BP- Sitting 116/82  Pulse- Sitting 62  Orthostatic Sitting 3 minutes  BP- Sitting 128/80  Pulse- Sitting 67  Orthostatic Standing at 1 minute  BP- Standing at 0 minutes 107/74  Pulse- Standing at 0 minutes 72   Orthostatic Standing at 3 minutes   BP- Sitting 125/85  Pulse- Sitting 72   Pt asymptomatic throughout, then AMB 3x around the unit without any acute abnormality.   11:58 AM, 11/05/20 Etta Grandchild, PT, DPT Physical Therapist - [facility]  [phone] [facility])

## 2020-11-05 NOTE — Progress Notes (Signed)
Inpatient Diabetes Program Recommendations  AACE/ADA: New Consensus Statement on Inpatient Glycemic Control (2015)  Target Ranges:  Prepandial:   less than 140 mg/dL      Peak postprandial:   less than 180 mg/dL (1-2 hours)      Critically ill patients:  140 - 180 mg/dL   Lab Results  Component Value Date   GLUCAP 110 (H) 11/05/2020   HGBA1C 7.0 (H) 11/04/2020    Review of Glycemic Control Results for Tom Moore, Tom Moore (MRN [id number]) as of 11/05/2020 12:10  Ref. Range 11/05/2020 05:18 11/05/2020 05:34 11/05/2020 06:12 11/05/2020 07:41 11/05/2020 11:52  Glucose-Capillary Latest Ref Range: 70 - 99 mg/dL 71 74 156 (H) 139 (H) 110 (H)   Diabetes history: DM2 Outpatient Diabetes medications: NPH 28 units bid ac meals + Glucotrol 10 mg bid + Metformin 1 gm qd Current orders for Inpatient glycemic control: Novolog 0-15 units correction qid  Inpatient Diabetes Program Recommendations:   Patient hs hypoglycemia. -Decrease Novolog hs correction to 0-5 units  Thank you, Bethena Roys E. Jonatha Gagen, RN, MSN, CDE  Diabetes Coordinator Inpatient Glycemic Control Team Team Pager [phone] (8am-5pm) 11/05/2020 12:13 PM

## 2020-11-05 NOTE — Progress Notes (Addendum)
PROGRESS NOTE    Tom Moore  NWG:[id number] DOB: 26-May-1946 DOA: 11/04/2020 PCP: Jerrol Banana., MD   Brief Narrative:  75 year old male with past medical history of systolic congestive heart failure  (EF 20-25%), paroxysmal atrial fibrillation (not currently on anticoagulation), hyperlipidemia, insulin-dependent diabetes mellitus type 2, gastroesophageal reflux disease, chronic kidney disease stage IIIb who presents to [facility] emergency department after experiencing multiple episodes of loss of consciousness. First episode soon after standing up to ambulate to the kitchen and the second time again soon after standing up returning from the bathroom. Due to patient's known history of advanced systolic congestive heart failure with significantly depressed EF the hospitalist group was then called to assess the patient for admission to the hospital.  Assessment & Plan:   Principal Problem:   Syncope Active Problems:   Mixed hyperlipidemia   Essential hypertension   Type 2 diabetes mellitus without complication, with long-term current use of insulin (HCC)   GERD without esophagitis   AF (paroxysmal atrial fibrillation) (HCC)   Benign prostatic hyperplasia with lower urinary tract symptoms   Chronic kidney disease, stage 3b (HCC)   Chronic systolic CHF (congestive heart failure) (HCC)   Syncope, likely orthostatic in nature -Continues to be profoundly positive on orthostatics today even after IV fluids and increased p.o. intake -Restart IV fluids -None heart failure EF 20 to 25%, cardiology following the outpatient setting for event monitor  Mixed hyperlipidemia -Continue statin  Essential hypertension -Discontinue spironolactone, discontinue metoprolol given above -Continue Entresto, furosemide -consider discontinuation or decreasing doses if hypotension is ongoing  Type 2 diabetes mellitus without complication, with long-term current use of insulin - Holding home regimen  of scheduled insulin given hypoglycemia at intake -Continue to advance diet, reinstitute insulin pending glucose readings Lab Results  Component Value Date   HGBA1C 7.0 (H) 11/04/2020   Paroxysmal atrial fibrillation, rate controlled  - Continue diltiazem and amiodarone  -Patient to follow outpatient cardiology for Holter monitor -No clear indication for not being on anticoagulation, cardiology note November 21 states patient is on Eliquis, unclear if patient has had episodes of bleeding since then and has been discontinued, no indication in the chart - but no active prescription on med rec.  Chronic kidney disease, stage 3b (HCC) - At baseline - Continue IV fluids in the setting of above - Monitor closely given heart failure  GERD without esophagitis -Continue PPI  Benign prostatic hyperplasia with lower urinary tract symptoms - Continue tamsulosin   DVT prophylaxis: Early ambulation, SCDs.  Patient not on full dose anticoagulation despite history of A. fib, likely indicating high risk for bleeding.  Patient cannot recall reason for no anticoagulation. Code Status: Full Family Communication: None available  Status is: Inpatient  Dispo: The patient is from: Home              Anticipated d/c is to: Home              Anticipated d/c date is: 24 to 48 hours              Patient currently not medically stable for discharge  Consultants:   Cardiology  Procedures:   None  Antimicrobials:  None  Subjective: No acute issues or events overnight denies nausea vomiting diarrhea constipation headache fevers or chills.  Patient still profoundly symptomatic with standing or sitting from supine with positive orthostatics.  Objective: Vitals:   11/04/20 2001 11/05/20 0012 11/05/20 0417 11/05/20 0418  BP: 106/61 106/70  126/65  Pulse: 74 69  65  Resp: 20 18    Temp: 98.7 F (37.1 C) 98.5 F (36.9 C)  98.1 F (36.7 C)  TempSrc: Oral Oral  Oral  SpO2: 96% 97%    Weight:    103.5 kg   Height:        Intake/Output Summary (Last 24 hours) at 11/05/2020 0759 Last data filed at 11/05/2020 0745 Gross per 24 hour  Intake 750 ml  Output 2020 ml  Net -1270 ml   Filed Weights   11/04/20 1051 11/05/20 0417  Weight: 105.7 kg 103.5 kg    Examination:  General:  Pleasantly resting in bed, No acute distress. HEENT:  Normocephalic atraumatic.  Sclerae nonicteric, noninjected.  Extraocular movements intact bilaterally. Neck:  Without mass or deformity.  Trachea is midline. Lungs:  Clear to auscultate bilaterally without rhonchi, wheeze, or rales. Heart:  Regular rate and rhythm.  Without murmurs, rubs, or gallops. Abdomen:  Soft, nontender, nondistended.  Without guarding or rebound. Extremities: Without cyanosis, clubbing, edema, or obvious deformity. Vascular:  Dorsalis pedis and posterior tibial pulses palpable bilaterally. Skin:  Warm and dry, no erythema, no ulcerations.  Data Reviewed: I have personally reviewed following labs and imaging studies  CBC: Recent Labs  Lab 11/04/20 1054 11/05/20 0511  WBC 5.3 5.1  NEUTROABS  --  3.1  HGB 14.5 13.5  HCT 43.4 40.1  MCV 92.1 91.3  PLT 172 381*   Basic Metabolic Panel: Recent Labs  Lab 11/04/20 1054 11/05/20 0511  NA 138 138  K 4.5 4.0  CL 103 105  CO2 23 23  GLUCOSE 148* 67*  BUN 23 20  CREATININE 1.68* 1.46*  CALCIUM 9.4 8.9  MG  --  2.0   GFR: Estimated Creatinine Clearance: 53.4 mL/min (A) (by C-G formula based on SCr of 1.46 mg/dL (H)). Liver Function Tests: Recent Labs  Lab 11/05/20 0511  AST 37  ALT 52*  ALKPHOS 70  BILITOT 0.8  PROT 7.0  ALBUMIN 3.9   No results for input(s): LIPASE, AMYLASE in the last 168 hours. No results for input(s): AMMONIA in the last 168 hours. Coagulation Profile: No results for input(s): INR, PROTIME in the last 168 hours. Cardiac Enzymes: No results for input(s): CKTOTAL, CKMB, CKMBINDEX, TROPONINI in the last 168 hours. BNP (last 3 results) No  results for input(s): PROBNP in the last 8760 hours. HbA1C: Recent Labs    11/04/20 1054  HGBA1C 7.0*   CBG: Recent Labs  Lab 11/04/20 2006 11/05/20 0518 11/05/20 0534 11/05/20 0612 11/05/20 0741  GLUCAP 208* 71 74 156* 139*   Lipid Profile: No results for input(s): CHOL, HDL, LDLCALC, TRIG, CHOLHDL, LDLDIRECT in the last 72 hours. Thyroid Function Tests: No results for input(s): TSH, T4TOTAL, FREET4, T3FREE, THYROIDAB in the last 72 hours. Anemia Panel: No results for input(s): VITAMINB12, FOLATE, FERRITIN, TIBC, IRON, RETICCTPCT in the last 72 hours. Sepsis Labs: No results for input(s): PROCALCITON, LATICACIDVEN in the last 168 hours.  Recent Results (from the past 240 hour(s))  SARS CORONAVIRUS 2 (TAT 6-24 HRS) Nasopharyngeal Urine, Clean Catch     Status: None   Collection Time: 11/04/20 12:10 PM   Specimen: Urine, Clean Catch; Nasopharyngeal  Result Value Ref Range Status   SARS Coronavirus 2 NEGATIVE NEGATIVE Final    Comment: (NOTE) SARS-CoV-2 target nucleic acids are NOT DETECTED.  The SARS-CoV-2 RNA is generally detectable in upper and lower respiratory specimens during the acute phase of infection. Negative results do not preclude SARS-CoV-2  infection, do not rule out co-infections with other pathogens, and should not be used as the sole basis for treatment or other patient management decisions. Negative results must be combined with clinical observations, patient history, and epidemiological information. The expected result is Negative.  Fact Sheet for Patients: SugarRoll.be  Fact Sheet for Healthcare Providers: https://www.woods-mathews.com/  This test is not yet approved or cleared by the [place] FDA and  has been authorized for detection and/or diagnosis of SARS-CoV-2 by FDA under an Emergency Use Authorization (EUA). This EUA will remain  in effect (meaning this test can be used) for the duration of  the COVID-19 declaration under Se ction 564(b)(1) of the Act, 21 U.S.C. section 360bbb-3(b)(1), unless the authorization is terminated or revoked sooner.  Performed at [facility], [place] [address] [place], [place] [address]      Radiology Studies: DG Chest 1 View  Result Date: 11/04/2020 CLINICAL DATA:  Syncopal episode. EXAM: CHEST  1 VIEW COMPARISON:  Radiographs 09/08/2017 and 12/18/2015. FINDINGS: 1400 hours. Lordotic positioning. The heart size and mediastinal contours are stable. The lungs are clear. There is no pleural effusion or pneumothorax. No acute osseous findings are evident. Telemetry leads overlie the chest. IMPRESSION: No active cardiopulmonary process. Electronically Signed   By: Richardean Sale M.D.   On: 11/04/2020 14:37   Scheduled Meds: . amiodarone  200 mg Oral Daily  . atorvastatin  40 mg Oral Daily  . buPROPion  150 mg Oral BID  . diltiazem  120 mg Oral Daily  . dutasteride  0.5 mg Oral Daily  . furosemide  40 mg Oral Daily  . gabapentin  400 mg Oral Q lunch  . insulin aspart  0-15 Units Subcutaneous TID AC & HS  . methimazole  5 mg Oral Daily  . metoprolol succinate  100 mg Oral Daily  . pantoprazole  40 mg Oral Daily  . sacubitril-valsartan  1 tablet Oral BID  . sodium chloride flush  3 mL Intravenous Q12H  . spironolactone  25 mg Oral Daily  . tamsulosin  0.4 mg Oral Q lunch  . venlafaxine XR  150 mg Oral Q breakfast   Continuous Infusions:   LOS: 0 days   Time spent: 80min  Parry Po C Adaliz Dobis, DO [facility]  If 7PM-7AM, please contact night-coverage [email or web address]  11/05/2020, 7:59 AM

## 2020-11-05 NOTE — Plan of Care (Signed)
Pts bp & HR when lying - 126/65 - 65, sitting - 123/76 - 68, standing - 97/82 - 81. Denies dizziness or lightheadedness.

## 2020-11-05 NOTE — Evaluation (Signed)
Physical Therapy Evaluation Patient Details Name: Tom Moore MRN: [id number] DOB: Dec 14, 1945 Today's Date: 11/05/2020   History of Present Illness  Tom Moore is a 29yoM who [facility] on [facility] after >1 week of syncope, presyncope like episodes, most recent LOC on Sunday morning. Note from [facility] on 2/2 reports pt having 1 week of shaking episodes, weakness, imbalance, gait ataxia, SOB. PMH: HTN, HLD, IDDM2, AF on eliquis, CKD, CHF c EF 20-25%. Pt orthostatic in ED 2/9.  Clinical Impression  Pt admitted with above diagnosis. Pt currently with functional limitations due to the deficits listed below (see "PT Problem List"). Upon entry, pt in bed, awake and agreeable to participate. The pt is alert, pleasant, interactive, and able to provide info regarding prior level of function, both in tolerance and independence. Pt orthostatic in session, but with BP rebound in 1-2 minutes, never symptomatic this session. Pt has elevated PVC during AMB, but no symptoms or complaints. MD made aware, who reports goals include improvement of orthostatic BP prior to DC.  Patient's performance this date reveals decreased ability, independence, and tolerance in performing all basic mobility required for performance of activities of daily living. Pt requires additional DME, close physical assistance, and cues for safe participate in mobility. Pt will benefit from skilled PT intervention to increase independence and safety with basic mobility in preparation for discharge to the venue listed below.       Follow Up Recommendations Outpatient PT    Equipment Recommendations  None recommended by PT    Recommendations for Other Services       Precautions / Restrictions Precautions Precautions: Fall Precaution Comments: history of syncope Restrictions Weight Bearing Restrictions: No      Mobility  Bed Mobility Overal bed mobility: Independent                   Transfers Overall transfer level: Independent                  Ambulation/Gait   Gait Distance (Feet): 530 Feet Assistive device: None Gait Pattern/deviations: WFL(Within Functional Limits)        Stairs            Wheelchair Mobility    Modified Rankin (Stroke Patients Only)       Balance                                             Pertinent Vitals/Pain Pain Assessment: No/denies pain    Home Living Family/patient expects to be discharged to:: Private residence Living Arrangements: Alone Available Help at Discharge: Family Type of Home: House Home Access: Level entry     Home Layout: One level Home Equipment: Cane - single point      Prior Function Level of Independence: Independent   Gait / Transfers Assistance Needed: still drives, gropcery shops  ADL's / Homemaking Assistance Needed: independent, intermittent LOB issues, multiple syncopal falls; Reports issues with syncope, near syncope x 1 year        Hand Dominance   Dominant Hand: Right    Extremity/Trunk Assessment   Upper Extremity Assessment Upper Extremity Assessment: Overall WFL for tasks assessed    Lower Extremity Assessment Lower Extremity Assessment: Overall WFL for tasks assessed    Cervical / Trunk Assessment Cervical / Trunk Assessment: Normal  Communication      Cognition  Arousal/Alertness: Awake/alert Behavior During Therapy: WFL for tasks assessed/performed Overall Cognitive Status: Within Functional Limits for tasks assessed                                        General Comments      Exercises     Assessment/Plan    PT Assessment Patient needs continued PT services  PT Problem List Decreased activity tolerance;Decreased balance       PT Treatment Interventions Functional mobility training;Therapeutic activities;Therapeutic exercise    PT Goals (Current goals can be found in the Care Plan section)  Acute  Rehab PT Goals Patient Stated Goal: stop falling, syncopizting PT Goal Formulation: With patient Time For Goal Achievement: 11/19/20 Potential to Achieve Goals: Fair    Frequency Min 2X/week   Barriers to discharge        Co-evaluation               AM-PAC PT "6 Clicks" Mobility  Outcome Measure Help needed turning from your back to your side while in a flat bed without using bedrails?: None Help needed moving from lying on your back to sitting on the side of a flat bed without using bedrails?: None Help needed moving to and from a bed to a chair (including a wheelchair)?: A Little Help needed standing up from a chair using your arms (e.g., wheelchair or bedside chair)?: A Little Help needed to walk in hospital room?: A Little Help needed climbing 3-5 steps with a railing? : A Little 6 Click Score: 20    End of Session Equipment Utilized During Treatment: Gait belt Activity Tolerance: Patient tolerated treatment well;No increased pain Patient left: in chair;with call bell/phone within reach Nurse Communication: Mobility status PT Visit Diagnosis: Muscle weakness (generalized) (M62.81)    Time: 8638-1771 PT Time Calculation (min) (ACUTE ONLY): 32 min   Charges:   PT Evaluation $PT Eval Low Complexity: 1 Low PT Treatments $Therapeutic Exercise: 8-22 mins        12:17 PM, 11/05/20 Etta Grandchild, PT, DPT Physical Therapist - [facility]  [phone] ([place])    Christia Coaxum C 11/05/2020, 12:09 PM

## 2020-11-06 LAB — GLUCOSE, CAPILLARY
Glucose-Capillary: 99 mg/dL (ref 70–99)
Glucose-Capillary: 92 mg/dL (ref 70–99)

## 2020-11-06 MED ORDER — FUROSEMIDE 40 MG PO TABS: 40.0000 mg | ORAL_TABLET | Freq: Every day | ORAL | 0 refills | Status: DC

## 2020-11-06 NOTE — Progress Notes (Signed)
Pt is discharging home. Discharge documentation is completed. Waiting on ride.

## 2020-11-06 NOTE — Discharge Summary (Signed)
Physician Discharge Summary  Tom Moore QQP:[id number] DOB: 1946-01-10 DOA: 11/04/2020  PCP: Jerrol Banana., MD  Admit date: 11/04/2020 Discharge date: 11/06/2020  Admitted From: Home Disposition: Home  Recommendations for Outpatient Follow-up:  1. Follow up with PCP in 1-2 weeks 2. Please obtain BMP/CBC in one week  Home Health: None Equipment/Devices: None  Discharge Condition: Stable CODE STATUS: Full full Diet recommendation: Low-salt low-fat diabetic diet  Brief/Interim Summary:  75 year old male with past medical history of systolic congestive heart failure(EF 20-25%),paroxysmal atrial fibrillation(not currently on anticoagulation), hyperlipidemia, insulin-dependent diabetes mellitus type 2, gastroesophageal reflux disease, chronic kidney disease stage IIIb who presents to [facility] emergency department after experiencing multiple episodes of loss of consciousness. First episode soon after standing up to ambulate to the kitchen and the second time again soon after standing up returning from the bathroom. Due to patient's known history of advancedsystolic congestive heart failure with significantly depressed EF the hospitalist group was then called to assess the patient for admission to the hospital.  Patient admitted with acute syncopal episode likely in setting of orthostatic hypotension given profound orthostatics positive here even after 2 L of normal saline.  Patient's spironolactone was discontinued his metoprolol was discontinued his Lasix was cut to once daily from twice daily dosing with marked improvement in patient's blood pressure especially orthostatic blood pressures.  He will need close follow-up with PCP and/or cardiology in the next 1 to 2 weeks to further evaluate blood pressure and fluid status.  At this time patient is asymptomatic with ambulation, his orthostatic vital signs have improved drastically and is otherwise stable and agreeable for discharge home.   PT has recommended ongoing outpatient physical therapy which they will set up at discharge.   Discharge Diagnoses:  Principal Problem:   Syncope Active Problems:   Mixed hyperlipidemia   Essential hypertension   Type 2 diabetes mellitus without complication, with long-term current use of insulin (HCC)   GERD without esophagitis   AF (paroxysmal atrial fibrillation) (HCC)   Benign prostatic hyperplasia with lower urinary tract symptoms   Chronic kidney disease, stage 3b (HCC)   Chronic systolic CHF (congestive heart failure) [facility])    Discharge Instructions  Discharge Instructions    Call MD for:  persistant dizziness or light-headedness   Complete by: As directed    Diet Carb Modified   Complete by: As directed    Increase activity slowly   Complete by: As directed      Allergies as of 11/06/2020      Reactions   Zyrtec Allergy [cetirizine] Diarrhea      Medication List    STOP taking these medications   metoprolol succinate 100 MG 24 hr tablet Commonly known as: TOPROL-XL   spironolactone 25 MG tablet Commonly known as: ALDACTONE     TAKE these medications   atorvastatin 40 MG tablet Commonly known as: LIPITOR Take 40 mg by mouth daily.   buPROPion 150 MG 12 hr tablet Commonly known as: WELLBUTRIN SR Take 1 tablet (150 mg total) by mouth 2 (two) times daily.   diltiazem 120 MG 24 hr capsule Commonly known as: CARDIZEM CD Take 1 capsule (120 mg total) by mouth daily.   dutasteride 0.5 MG capsule Commonly known as: AVODART Take 1 capsule (0.5 mg total) by mouth daily.   furosemide 40 MG tablet Commonly known as: LASIX Take 1 tablet (40 mg total) by mouth daily.   gabapentin 400 MG capsule Commonly known as: NEURONTIN Take 400 mg by  mouth daily. AT LUNCH   glipiZIDE 10 MG tablet Commonly known as: GLUCOTROL TAKE ONE TABLET BY MOUTH THREE TIMES DAILY BEFORE MEALS What changed:   how much to take  when to take this  additional instructions    glucose blood test strip Use 3 (three) times daily One touch ultra test strips e11.29   insulin NPH Human 100 UNIT/ML injection Commonly known as: NOVOLIN N 28 Units 2 (two) times daily before a meal.   meclizine 25 MG tablet Commonly known as: ANTIVERT TAKE 1 TABLET BY MOUTH THREE TIMES DAILY AS NEEDED FOR  DIZZINESS   metFORMIN 1000 MG tablet Commonly known as: GLUCOPHAGE TAKE 1 TABLET BY MOUTH TWICE DAILY WITH A MEAL What changed: See the new instructions.   methimazole 5 MG tablet Commonly known as: TAPAZOLE Take 5 mg by mouth.   multivitamin-iron-minerals-folic acid Tabs tablet Take 1 tablet by mouth daily.   Na Sulfate-K Sulfate-Mg Sulf 17.5-3.13-1.6 GM/177ML Soln At 5 PM the day before procedure take 1 bottle and 5 hours before procedure take 1 bottle.   omeprazole 40 MG capsule Commonly known as: PRILOSEC Take 1 capsule by mouth once daily   Ozempic (0.25 or 0.5 MG/DOSE) 2 MG/1.5ML Sopn Generic drug: Semaglutide(0.25 or 0.5MG /DOS) 1.5 mLs once a week.   Pacerone 200 MG tablet Generic drug: amiodarone Take by mouth.   sacubitril-valsartan 24-26 MG Commonly known as: ENTRESTO Take by mouth.   sertraline 100 MG tablet Commonly known as: ZOLOFT Take 1 tablet by mouth once daily   sucralfate 1 g tablet Commonly known as: CARAFATE TAKE 1 TABLET BY MOUTH THREE TIMES DAILY WITH MEALS What changed: when to take this   tamsulosin 0.4 MG Caps capsule Commonly known as: FLOMAX TAKE 2 CAPSULES BY MOUTH ONCE DAILY WITH LUNCH What changed: See the new instructions.   triamcinolone 0.1 % Commonly known as: KENALOG Apply 0.1 application topically 1 day or 1 dose.   Trulicity 1.5 EP/3.2RJ Sopn Generic drug: Dulaglutide Inject into the skin once a week.   venlafaxine XR 150 MG 24 hr capsule Commonly known as: EFFEXOR-XR TAKE 1 CAPSULE BY MOUTH ONCE DAILY WITH BREAKFAST What changed: See the new instructions.       Follow-up Information    Jerrol Banana., MD. Go on 11/10/2020.   Specialty: Family Medicine Why: 3:20pm appointment Contact information: [address] [place] [address] [phone]              Allergies  Allergen Reactions  . Zyrtec Allergy [Cetirizine] Diarrhea    Consultations:  None   Procedures/Studies: DG Chest 1 View  Result Date: 11/04/2020 CLINICAL DATA:  Syncopal episode. EXAM: CHEST  1 VIEW COMPARISON:  Radiographs 09/08/2017 and 12/18/2015. FINDINGS: 1400 hours. Lordotic positioning. The heart size and mediastinal contours are stable. The lungs are clear. There is no pleural effusion or pneumothorax. No acute osseous findings are evident. Telemetry leads overlie the chest. IMPRESSION: No active cardiopulmonary process. Electronically Signed   By: Richardean Sale M.D.   On: 11/04/2020 14:37   ECHOCARDIOGRAM COMPLETE  Result Date: 11/05/2020    ECHOCARDIOGRAM REPORT   Patient Name:   Tom Moore Date of Exam: 11/04/2020 Medical Rec #:  [id number]        Height:       69.0 in Accession #:    [id number]       Weight:       233.0 lb Date of Birth:  [birth year]  BSA:          2.205 m Patient Age:    42 years         BP:           106/61 mmHg Patient Gender: M                HR:           73 bpm. Exam Location:  [facility] Procedure: 2D Echo, Cardiac Doppler and Color Doppler Indications:     R55 Syncope  History:         Patient has no prior history of Echocardiogram examinations.                  Risk Factors:Hypertension, Diabetes, Dyslipidemia and Obesity.                  Sleep apnea. Hyperthyroidism. Congestive heart failure.  Sonographer:     Wilford Sports Rodgers-Jones Referring Phys:  [id number] Vernelle Emerald Diagnosing Phys: Yolonda Kida MD IMPRESSIONS  1. Left ventricular ejection fraction, by estimation, is 65 to 70%. The left ventricle has normal function. The left ventricle has no regional wall motion abnormalities. Left ventricular diastolic parameters are consistent with  Grade I diastolic dysfunction (impaired relaxation).  2. Right ventricular systolic function is normal. The right ventricular size is normal.  3. The mitral valve is normal in structure. No evidence of mitral valve regurgitation.  4. The aortic valve is tricuspid. There is moderate calcification of the aortic valve. There is moderate thickening of the aortic valve. Aortic valve regurgitation is not visualized. Mild to moderate aortic valve sclerosis/calcification is present, without any evidence of aortic stenosis. FINDINGS  Left Ventricle: Left ventricular ejection fraction, by estimation, is 65 to 70%. The left ventricle has normal function. The left ventricle has no regional wall motion abnormalities. The left ventricular internal cavity size was normal in size. There is  no left ventricular hypertrophy. Left ventricular diastolic parameters are consistent with Grade I diastolic dysfunction (impaired relaxation). Right Ventricle: The right ventricular size is normal. No increase in right ventricular wall thickness. Right ventricular systolic function is normal. Left Atrium: Left atrial size was normal in size. Right Atrium: Right atrial size was normal in size. Pericardium: There is no evidence of pericardial effusion. Mitral Valve: The mitral valve is normal in structure. No evidence of mitral valve regurgitation. Tricuspid Valve: The tricuspid valve is normal in structure. Tricuspid valve regurgitation is trivial. Aortic Valve: The aortic valve is tricuspid. There is moderate calcification of the aortic valve. There is moderate thickening of the aortic valve. There is mild to moderate aortic valve annular calcification. Aortic valve regurgitation is not visualized. Mild to moderate aortic valve sclerosis/calcification is present, without any evidence of aortic stenosis. Pulmonic Valve: The pulmonic valve was normal in structure. Pulmonic valve regurgitation is not visualized. Aorta: The aortic arch was not well  visualized. IAS/Shunts: No atrial level shunt detected by color flow Doppler.  LEFT VENTRICLE PLAX 2D LVIDd:         5.05 cm  Diastology LVIDs:         3.00 cm  LV e' medial:    4.90 cm/s LV PW:         1.23 cm  LV E/e' medial:  13.2 LV IVS:        1.13 cm  LV e' lateral:   6.31 cm/s LVOT diam:     2.40 cm  LV E/e' lateral: 10.3 LV SV:  57 LV SV Index:   26 LVOT Area:     4.52 cm  RIGHT VENTRICLE RV Basal diam:  4.23 cm RV S prime:     13.60 cm/s TAPSE (M-mode): 2.3 cm LEFT ATRIUM             Index       RIGHT ATRIUM           Index LA diam:        4.80 cm 2.18 cm/m  RA Area:     21.10 cm LA Vol (A2C):   93.6 ml 42.46 ml/m RA Volume:   71.00 ml  32.20 ml/m LA Vol (A4C):   64.6 ml 29.30 ml/m LA Biplane Vol: 78.4 ml 35.56 ml/m  AORTIC VALVE LVOT Vmax:   76.05 cm/s LVOT Vmean:  56.650 cm/s LVOT VTI:    0.127 m  AORTA Ao Root diam: 4.10 cm Ao Asc diam:  4.10 cm MITRAL VALVE MV Area (PHT): 2.48 cm    SHUNTS MV Decel Time: 306 msec    Systemic VTI:  0.13 m MV E velocity: 64.70 cm/s  Systemic Diam: 2.40 cm MV A velocity: 92.50 cm/s MV E/A ratio:  0.70 Dwayne D Callwood MD Electronically signed by Yolonda Kida MD Signature Date/Time: 11/05/2020/8:04:18 AM    Final      Subjective: No acute issues or events overnight   Discharge Exam: Vitals:   11/06/20 0827 11/06/20 1049  BP: 114/73 103/73  Pulse: 63 60  Resp:    Temp: 98.5 F (36.9 C) 98.4 F (36.9 C)  SpO2: 98% 99%   Vitals:   11/06/20 0444 11/06/20 0457 11/06/20 0827 11/06/20 1049  BP: 114/72  114/73 103/73  Pulse: (!) 56  63 60  Resp: 18     Temp: 98.3 F (36.8 C)  98.5 F (36.9 C) 98.4 F (36.9 C)  TempSrc: Oral  Axillary Oral  SpO2: 98%  98% 99%  Weight:  102.9 kg    Height:        General: Pt is alert, awake, not in acute distress Cardiovascular: RRR, S1/S2 +, no rubs, no gallops Respiratory: CTA bilaterally, no wheezing, no rhonchi Abdominal: Soft, NT, ND, bowel sounds + Extremities: no edema, no  cyanosis    The results of significant diagnostics from this hospitalization (including imaging, microbiology, ancillary and laboratory) are listed below for reference.     Microbiology: Recent Results (from the past 240 hour(s))  SARS CORONAVIRUS 2 (TAT 6-24 HRS) Nasopharyngeal Urine, Clean Catch     Status: None   Collection Time: 11/04/20 12:10 PM   Specimen: Urine, Clean Catch; Nasopharyngeal  Result Value Ref Range Status   SARS Coronavirus 2 NEGATIVE NEGATIVE Final    Comment: (NOTE) SARS-CoV-2 target nucleic acids are NOT DETECTED.  The SARS-CoV-2 RNA is generally detectable in upper and lower respiratory specimens during the acute phase of infection. Negative results do not preclude SARS-CoV-2 infection, do not rule out co-infections with other pathogens, and should not be used as the sole basis for treatment or other patient management decisions. Negative results must be combined with clinical observations, patient history, and epidemiological information. The expected result is Negative.  Fact Sheet for Patients: SugarRoll.be  Fact Sheet for Healthcare Providers: https://www.woods-mathews.com/  This test is not yet approved or cleared by the [place] FDA and  has been authorized for detection and/or diagnosis of SARS-CoV-2 by FDA under an Emergency Use Authorization (EUA). This EUA will remain  in effect (meaning this test  can be used) for the duration of the COVID-19 declaration under Se ction 564(b)(1) of the Act, 21 U.S.C. section 360bbb-3(b)(1), unless the authorization is terminated or revoked sooner.  Performed at [facility], [place] [address], [place], [place] [address]      Labs: BNP (last 3 results) No results for input(s): BNP in the last 8760 hours. Basic Metabolic Panel: Recent Labs  Lab 11/04/20 1054 11/05/20 0511  NA 138 138  K 4.5 4.0  CL 103 105  CO2 23 23  GLUCOSE 148* 67*  BUN 23 20   CREATININE 1.68* 1.46*  CALCIUM 9.4 8.9  MG  --  2.0   Liver Function Tests: Recent Labs  Lab 11/05/20 0511  AST 37  ALT 52*  ALKPHOS 70  BILITOT 0.8  PROT 7.0  ALBUMIN 3.9   No results for input(s): LIPASE, AMYLASE in the last 168 hours. No results for input(s): AMMONIA in the last 168 hours. CBC: Recent Labs  Lab 11/04/20 1054 11/05/20 0511  WBC 5.3 5.1  NEUTROABS  --  3.1  HGB 14.5 13.5  HCT 43.4 40.1  MCV 92.1 91.3  PLT 172 143*   Cardiac Enzymes: No results for input(s): CKTOTAL, CKMB, CKMBINDEX, TROPONINI in the last 168 hours. BNP: Invalid input(s): POCBNP CBG: Recent Labs  Lab 11/05/20 1152 11/05/20 1638 11/05/20 2043 11/06/20 0454 11/06/20 0750  GLUCAP 110* 124* 141* 99 92   D-Dimer No results for input(s): DDIMER in the last 72 hours. Hgb A1c Recent Labs    11/04/20 1054  HGBA1C 7.0*   Lipid Profile No results for input(s): CHOL, HDL, LDLCALC, TRIG, CHOLHDL, LDLDIRECT in the last 72 hours. Thyroid function studies No results for input(s): TSH, T4TOTAL, T3FREE, THYROIDAB in the last 72 hours.  Invalid input(s): FREET3 Anemia work up No results for input(s): VITAMINB12, FOLATE, FERRITIN, TIBC, IRON, RETICCTPCT in the last 72 hours. Urinalysis    Component Value Date/Time   COLORURINE YELLOW (A) 11/04/2020 1210   APPEARANCEUR HAZY (A) 11/04/2020 1210   APPEARANCEUR Hazy (A) 08/27/2020 1503   LABSPEC 1.020 11/04/2020 1210   LABSPEC 1.013 01/01/2014 1619   PHURINE 5.0 11/04/2020 1210   GLUCOSEU NEGATIVE 11/04/2020 1210   GLUCOSEU >=500 01/01/2014 1619   HGBUR NEGATIVE 11/04/2020 1210   BILIRUBINUR NEGATIVE 11/04/2020 1210   BILIRUBINUR Negative 08/27/2020 1503   BILIRUBINUR Negative 01/01/2014 1619   KETONESUR 5 (A) 11/04/2020 1210   PROTEINUR NEGATIVE 11/04/2020 1210   UROBILINOGEN 0.2 07/05/2019 1020   NITRITE NEGATIVE 11/04/2020 1210   LEUKOCYTESUR NEGATIVE 11/04/2020 1210   LEUKOCYTESUR Negative 01/01/2014 1619   Sepsis  Labs Invalid input(s): PROCALCITONIN,  WBC,  LACTICIDVEN Microbiology Recent Results (from the past 240 hour(s))  SARS CORONAVIRUS 2 (TAT 6-24 HRS) Nasopharyngeal Urine, Clean Catch     Status: None   Collection Time: 11/04/20 12:10 PM   Specimen: Urine, Clean Catch; Nasopharyngeal  Result Value Ref Range Status   SARS Coronavirus 2 NEGATIVE NEGATIVE Final    Comment: (NOTE) SARS-CoV-2 target nucleic acids are NOT DETECTED.  The SARS-CoV-2 RNA is generally detectable in upper and lower respiratory specimens during the acute phase of infection. Negative results do not preclude SARS-CoV-2 infection, do not rule out co-infections with other pathogens, and should not be used as the sole basis for treatment or other patient management decisions. Negative results must be combined with clinical observations, patient history, and epidemiological information. The expected result is Negative.  Fact Sheet for Patients: SugarRoll.be  Fact Sheet for Healthcare  Providers: https://www.woods-mathews.com/  This test is not yet approved or cleared by the [place] and  has been authorized for detection and/or diagnosis of SARS-CoV-2 by FDA under an Emergency Use Authorization (EUA). This EUA will remain  in effect (meaning this test can be used) for the duration of the COVID-19 declaration under Se ction 564(b)(1) of the Act, 21 U.S.C. section 360bbb-3(b)(1), unless the authorization is terminated or revoked sooner.  Performed at [facility], [place] [address], [place], [place] [address]      Time coordinating discharge: Over 30 minutes  SIGNED:   Little Ishikawa, DO [facility] 11/06/2020, 6:03 PM Pager   If 7PM-7AM, please contact night-coverage [email or web address]

## 2020-11-07 NOTE — Progress Notes (Deleted)
Established patient visit   Patient: Tom Moore   DOB: [birth year]   75 y.o. Male  MRN: [id number] Visit Date: 11/10/2020  Today's healthcare provider: [name], MD   No chief complaint on file.  Subjective    HPI  Follow up Hospitalization  Patient was admitted to [facility] on 11/04/2020 and discharged on 11/06/2020. He was treated for; Syncope. Treatment for this included; see notes in chart. Telephone follow up was done on none He reports {excellent/good/fair:19992} compliance with treatment. He reports this condition is {resolved/improved/worsened:23923}.  ----------------------------------------------------------------------------------------- -    {Show patient history (optional):23778::" "}   Medications: Outpatient Medications Prior to Visit  Medication Sig  . atorvastatin (LIPITOR) 40 MG tablet Take 40 mg by mouth daily.  Marland Kitchen buPROPion (WELLBUTRIN SR) 150 MG 12 hr tablet Take 1 tablet (150 mg total) by mouth 2 (two) times daily.  Marland Kitchen diltiazem (CARDIZEM CD) 120 MG 24 hr capsule Take 1 capsule (120 mg total) by mouth daily.  Marland Kitchen dutasteride (AVODART) 0.5 MG capsule Take 1 capsule (0.5 mg total) by mouth daily.  . furosemide (LASIX) 40 MG tablet Take 1 tablet (40 mg total) by mouth daily.  Marland Kitchen gabapentin (NEURONTIN) 400 MG capsule Take 400 mg by mouth daily. AT LUNCH   . glipiZIDE (GLUCOTROL) 10 MG tablet TAKE ONE TABLET BY MOUTH THREE TIMES DAILY BEFORE MEALS (Patient taking differently: 10 mg 2 (two) times daily before a meal.)  . glucose blood test strip Use 3 (three) times daily One touch ultra test strips e11.29  . insulin NPH Human (HUMULIN N,NOVOLIN N) 100 UNIT/ML injection 28 Units 2 (two) times daily before a meal.   . meclizine (ANTIVERT) 25 MG tablet TAKE 1 TABLET BY MOUTH THREE TIMES DAILY AS NEEDED FOR  DIZZINESS  . metFORMIN (GLUCOPHAGE) 1000 MG tablet TAKE 1 TABLET BY MOUTH TWICE DAILY WITH A MEAL (Patient taking differently: daily with breakfast.)   . methimazole (TAPAZOLE) 5 MG tablet Take 5 mg by mouth.  . multivitamin-iron-minerals-folic acid (THERAPEUTIC-M) TABS tablet Take 1 tablet by mouth daily.  . Na Sulfate-K Sulfate-Mg Sulf 17.5-3.13-1.6 GM/177ML SOLN At 5 PM the day before procedure take 1 bottle and 5 hours before procedure take 1 bottle. (Patient not taking: No sig reported)  . omeprazole (PRILOSEC) 40 MG capsule Take 1 capsule by mouth once daily  . OZEMPIC, 0.25 OR 0.5 MG/DOSE, 2 MG/1.5ML SOPN 1.5 mLs once a week. (Patient not taking: No sig reported)  . PACERONE 200 MG tablet Take by mouth.  . sacubitril-valsartan (ENTRESTO) 24-26 MG Take by mouth.  . sertraline (ZOLOFT) 100 MG tablet Take 1 tablet by mouth once daily (Patient taking differently: Take 100 mg by mouth daily.)  . sucralfate (CARAFATE) 1 g tablet TAKE 1 TABLET BY MOUTH THREE TIMES DAILY WITH MEALS (Patient taking differently: Take 1 g by mouth 4 (four) times daily -  with meals and at bedtime.)  . tamsulosin (FLOMAX) 0.4 MG CAPS capsule TAKE 2 CAPSULES BY MOUTH ONCE DAILY WITH LUNCH (Patient taking differently: Take 0.8 mg by mouth daily with lunch.)  . triamcinolone cream (KENALOG) 0.1 % Apply 0.1 application topically 1 day or 1 dose. (Patient not taking: No sig reported)  . TRULICITY 1.5 QJ/1.9ER SOPN Inject into the skin once a week.  . venlafaxine XR (EFFEXOR-XR) 150 MG 24 hr capsule TAKE 1 CAPSULE BY MOUTH ONCE DAILY WITH BREAKFAST (Patient taking differently: Take 150 mg by mouth daily with breakfast.)   No facility-administered medications  prior to visit.    Review of Systems  Constitutional: Negative for appetite change, chills and fever.  Respiratory: Negative for chest tightness, shortness of breath and wheezing.   Cardiovascular: Negative for chest pain and palpitations.  Gastrointestinal: Negative for abdominal pain, nausea and vomiting.    {Labs  Heme  Chem  Endocrine  Serology  Results Review (optional):23779::" "}   Objective     There were no vitals taken for this visit. {Show previous vital signs (optional):23777::" "}   Physical Exam  ***  No results found for any visits on 11/10/20.  Assessment & Plan     ***  No follow-ups on file.      {provider attestation***:1}   [name], MD  [facility] [phone] (phone) [phone] (fax)  [place]

## 2020-11-10 ENCOUNTER — Inpatient Hospital Stay: Payer: Medicare Other | Admitting: Family Medicine

## 2020-11-15 ENCOUNTER — Other Ambulatory Visit: Payer: Self-pay | Admitting: Urology

## 2020-11-15 ENCOUNTER — Other Ambulatory Visit: Payer: Self-pay | Admitting: Family Medicine

## 2020-11-15 DIAGNOSIS — R141 Gas pain: Secondary | ICD-10-CM

## 2020-11-15 DIAGNOSIS — R110 Nausea: Secondary | ICD-10-CM

## 2020-11-17 ENCOUNTER — Other Ambulatory Visit: Payer: Self-pay | Admitting: Family Medicine

## 2020-11-18 ENCOUNTER — Emergency Department: Payer: Medicare Other

## 2020-11-18 ENCOUNTER — Other Ambulatory Visit: Payer: Self-pay

## 2020-11-18 ENCOUNTER — Observation Stay
Admission: EM | Admit: 2020-11-18 | Discharge: 2020-11-20 | Disposition: A | Discharge status: Home / Self Care | Payer: Medicare Other | Attending: Hospitalist | Admitting: Hospitalist

## 2020-11-18 DIAGNOSIS — Z79899 Other long term (current) drug therapy: Secondary | ICD-10-CM | POA: Diagnosis not present

## 2020-11-18 DIAGNOSIS — E0590 Thyrotoxicosis, unspecified without thyrotoxic crisis or storm: Secondary | ICD-10-CM | POA: Diagnosis not present

## 2020-11-18 DIAGNOSIS — Z7984 Long term (current) use of oral hypoglycemic drugs: Secondary | ICD-10-CM | POA: Diagnosis not present

## 2020-11-18 DIAGNOSIS — I10 Essential (primary) hypertension: Secondary | ICD-10-CM | POA: Diagnosis not present

## 2020-11-18 DIAGNOSIS — I130 Hypertensive heart and chronic kidney disease with heart failure and stage 1 through stage 4 chronic kidney disease, or unspecified chronic kidney disease: Secondary | ICD-10-CM | POA: Insufficient documentation

## 2020-11-18 DIAGNOSIS — N1832 Chronic kidney disease, stage 3b: Secondary | ICD-10-CM | POA: Diagnosis not present

## 2020-11-18 DIAGNOSIS — Z794 Long term (current) use of insulin: Secondary | ICD-10-CM | POA: Diagnosis not present

## 2020-11-18 DIAGNOSIS — E119 Type 2 diabetes mellitus without complications: Secondary | ICD-10-CM | POA: Insufficient documentation

## 2020-11-18 DIAGNOSIS — R55 Syncope and collapse: Secondary | ICD-10-CM | POA: Diagnosis not present

## 2020-11-18 DIAGNOSIS — Z86010 Personal history of colonic polyps: Secondary | ICD-10-CM | POA: Insufficient documentation

## 2020-11-18 DIAGNOSIS — Y900 Blood alcohol level of less than 20 mg/100 ml: Secondary | ICD-10-CM | POA: Insufficient documentation

## 2020-11-18 DIAGNOSIS — Z87891 Personal history of nicotine dependence: Secondary | ICD-10-CM | POA: Diagnosis not present

## 2020-11-18 DIAGNOSIS — Z20822 Contact with and (suspected) exposure to covid-19: Secondary | ICD-10-CM | POA: Diagnosis not present

## 2020-11-18 DIAGNOSIS — E1142 Type 2 diabetes mellitus with diabetic polyneuropathy: Secondary | ICD-10-CM

## 2020-11-18 DIAGNOSIS — I5022 Chronic systolic (congestive) heart failure: Secondary | ICD-10-CM | POA: Insufficient documentation

## 2020-11-18 LAB — LACTIC ACID, PLASMA
Lactic Acid, Venous: 4.6 mmol/L (ref 0.5–1.9)
Lactic Acid, Venous: 3.1 mmol/L (ref 0.5–1.9)
Lactic Acid, Venous: 2.7 mmol/L (ref 0.5–1.9)

## 2020-11-18 LAB — URINALYSIS, COMPLETE (UACMP) WITH MICROSCOPIC
Bilirubin Urine: NEGATIVE
Glucose, UA: NEGATIVE mg/dL
Hgb urine dipstick: NEGATIVE
Ketones, ur: 5 mg/dL — AB
Leukocytes,Ua: NEGATIVE
Nitrite: NEGATIVE
Protein, ur: NEGATIVE mg/dL
Specific Gravity, Urine: 1.026 (ref 1.005–1.030)
pH: 5 (ref 5.0–8.0)

## 2020-11-18 LAB — BASIC METABOLIC PANEL
Anion gap: 14 (ref 5–15)
BUN: 28 mg/dL — ABNORMAL HIGH (ref 8–23)
CO2: 19 mmol/L — ABNORMAL LOW (ref 22–32)
Calcium: 9.7 mg/dL (ref 8.9–10.3)
Chloride: 104 mmol/L (ref 98–111)
Creatinine, Ser: 1.8 mg/dL — ABNORMAL HIGH (ref 0.61–1.24)
GFR, Estimated: 39 mL/min — ABNORMAL LOW (ref >60)
Glucose, Bld: 110 mg/dL — ABNORMAL HIGH (ref 70–99)
Potassium: 4.1 mmol/L (ref 3.5–5.1)
Sodium: 137 mmol/L (ref 135–145)

## 2020-11-18 LAB — CBC
HCT: 40.8 % (ref 39.0–52.0)
Hemoglobin: 13.9 g/dL (ref 13.0–17.0)
MCH: 30.7 pg (ref 26.0–34.0)
MCHC: 34.1 g/dL (ref 30.0–36.0)
MCV: 90.1 fL (ref 80.0–100.0)
Platelets: 180 10*3/uL (ref 150–400)
RBC: 4.53 MIL/uL (ref 4.22–5.81)
RDW: 14.4 % (ref 11.5–15.5)
WBC: 6.9 10*3/uL (ref 4.0–10.5)
nRBC: 0 % (ref 0.0–0.2)

## 2020-11-18 LAB — TSH: TSH: 0.326 u[IU]/mL — ABNORMAL LOW (ref 0.350–4.500)

## 2020-11-18 LAB — TROPONIN I (HIGH SENSITIVITY)
Troponin I (High Sensitivity): 9 ng/L (ref <18)
Troponin I (High Sensitivity): 9 ng/L (ref <18)

## 2020-11-18 LAB — CBG MONITORING, ED
Glucose-Capillary: 45 mg/dL — ABNORMAL LOW (ref 70–99)
Glucose-Capillary: 120 mg/dL — ABNORMAL HIGH (ref 70–99)

## 2020-11-18 LAB — ETHANOL: Alcohol, Ethyl (B): <10 mg/dL (ref <10)

## 2020-11-18 IMAGING — CR DG CHEST 2V
2 series · 2 of 2 positions shown · non-contrast
Comparison: Chest x-ray dated [DATE].

CLINICAL DATA: Syncope.  Weakness.

EXAM:
CHEST - 2 VIEW

[chest lat]
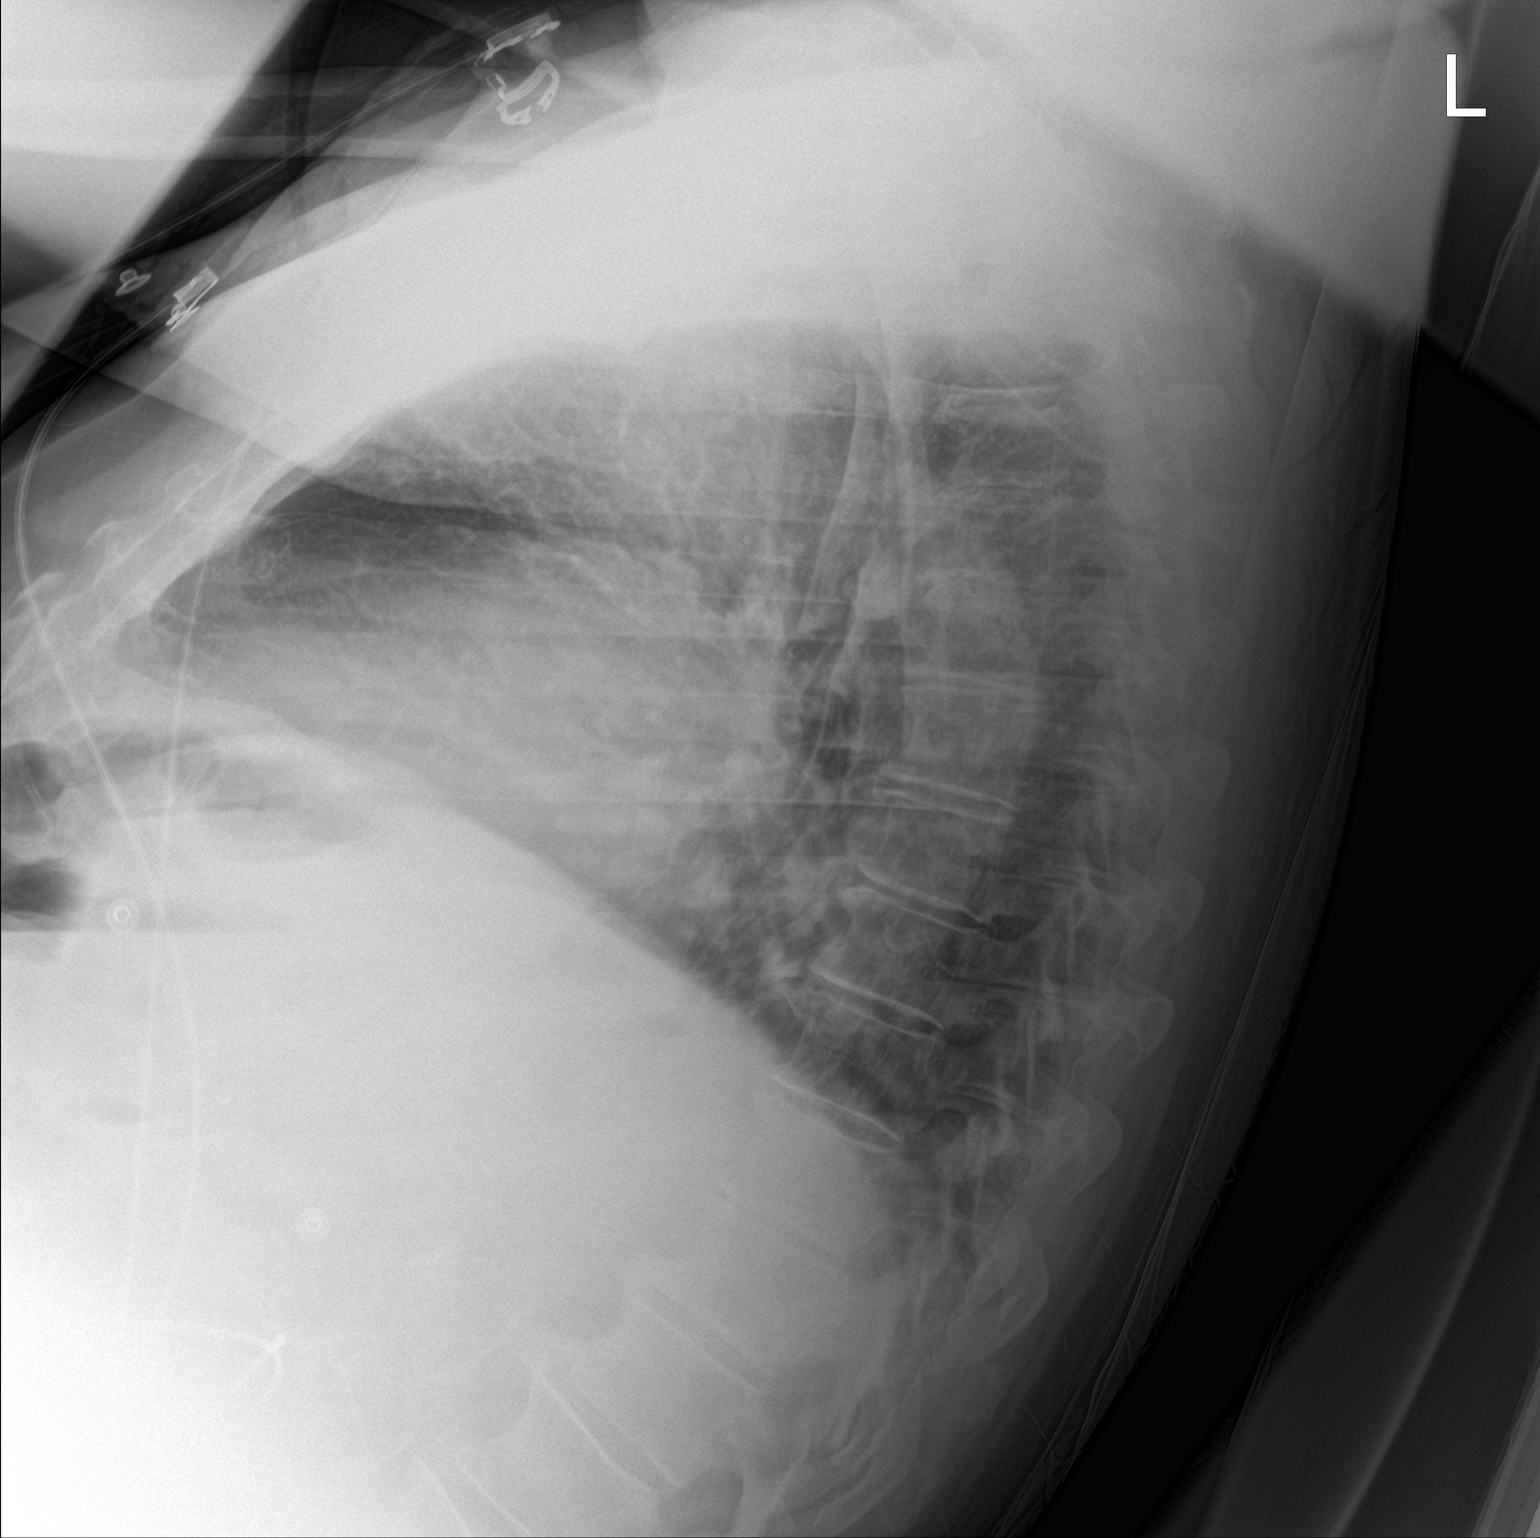

[chest ap]
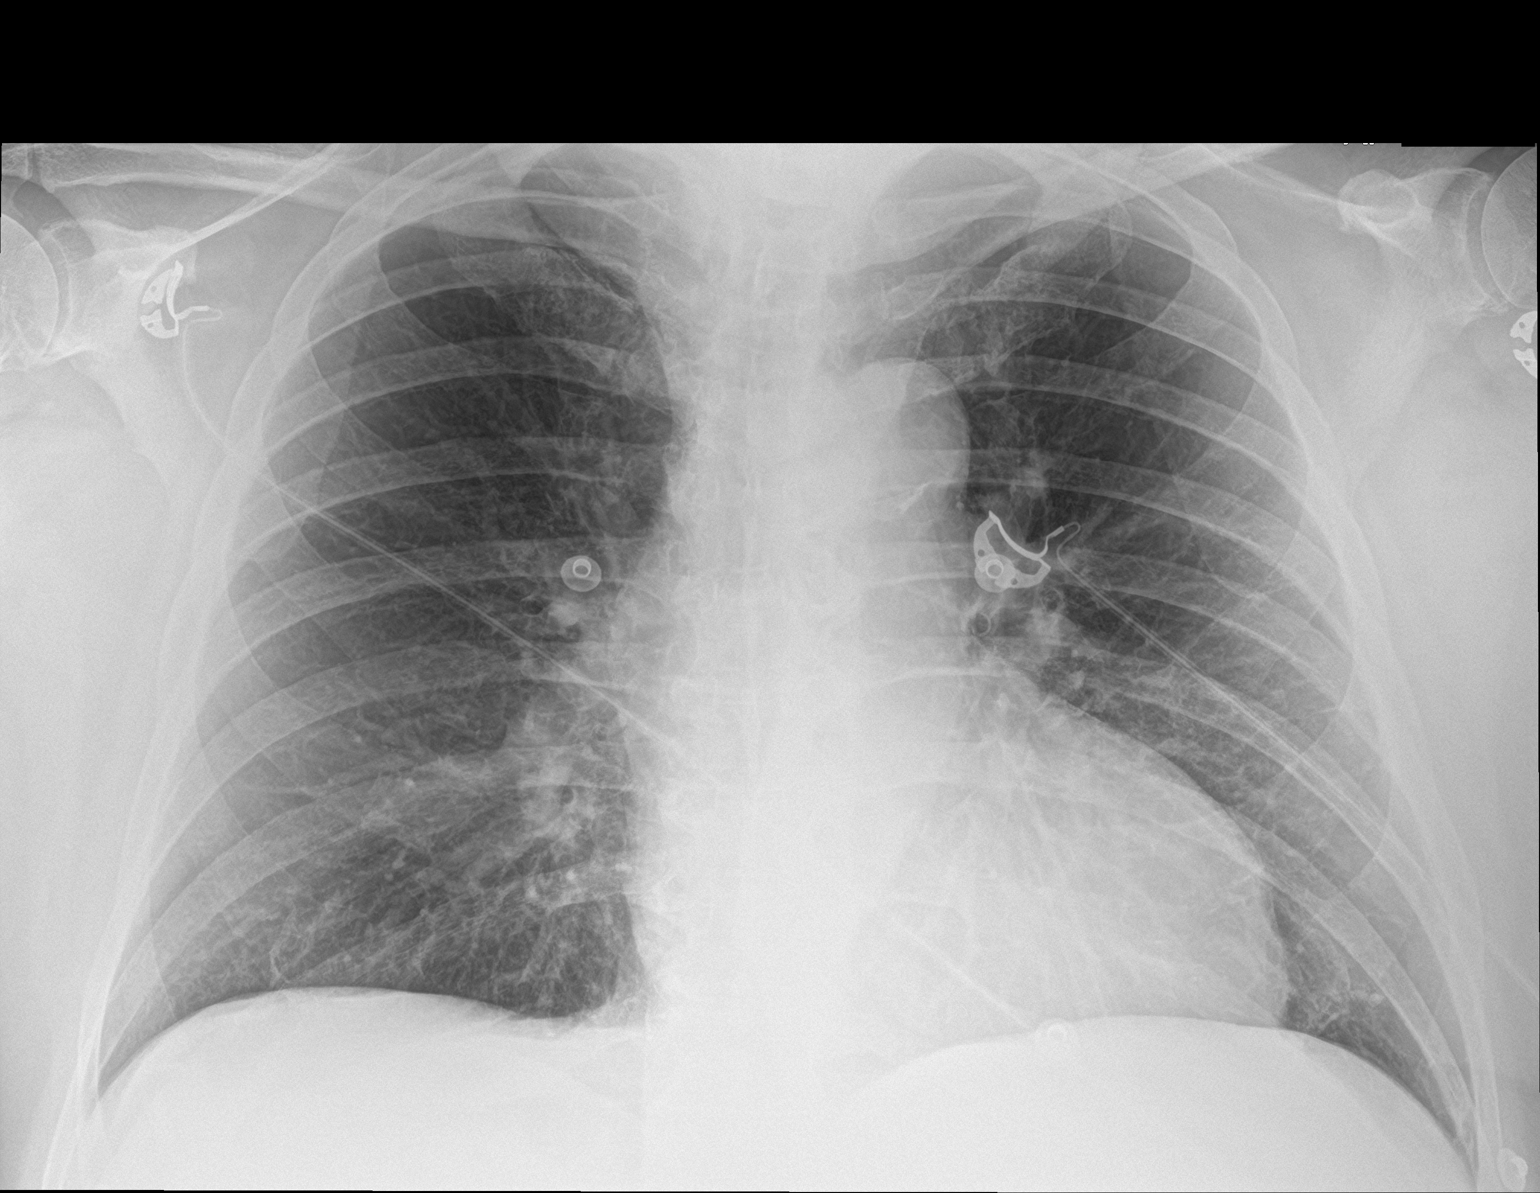

[2 of 2 positions shown; findings below may reference images not displayed]

FINDINGS: The heart size and mediastinal contours are within normal limits.
Both lungs are clear. The visualized skeletal structures are
unremarkable.
IMPRESSION: No active cardiopulmonary disease.

## 2020-11-18 IMAGING — CR DG SHOULDER 2+V*R*
3 series · 3 of 3 positions shown · non-contrast
Comparison: None.

CLINICAL DATA: Right shoulder pain after fall.

EXAM:
RIGHT SHOULDER - 2+ VIEW

[shoulder grashey]
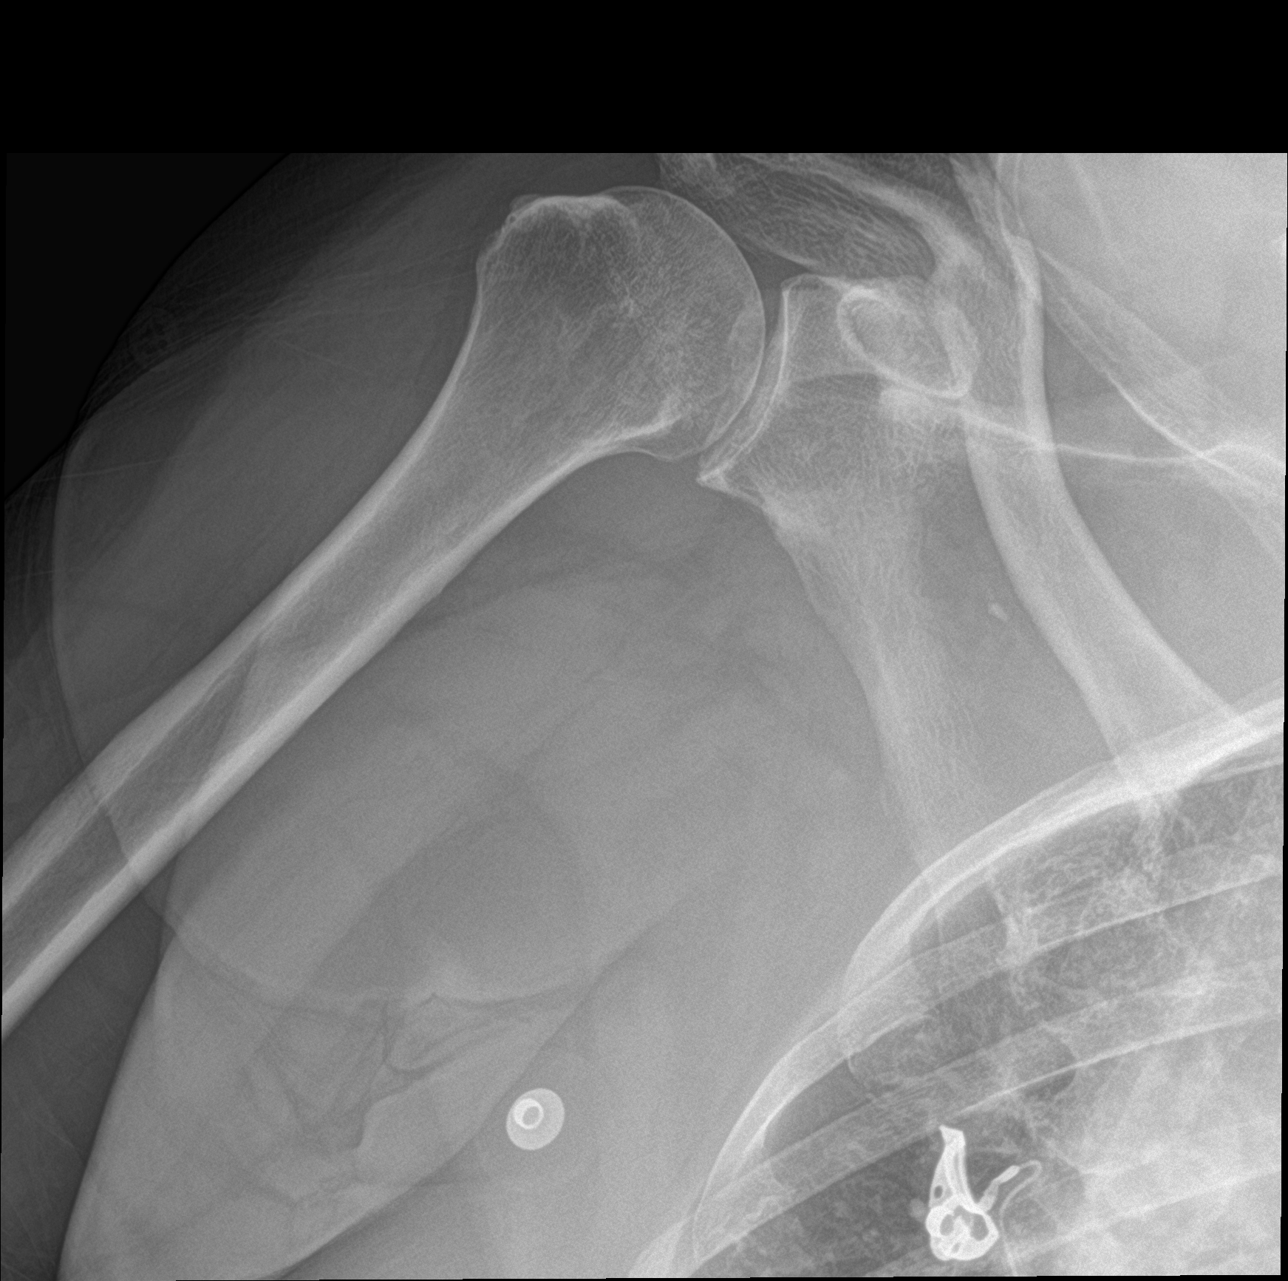

[shoulder y view]
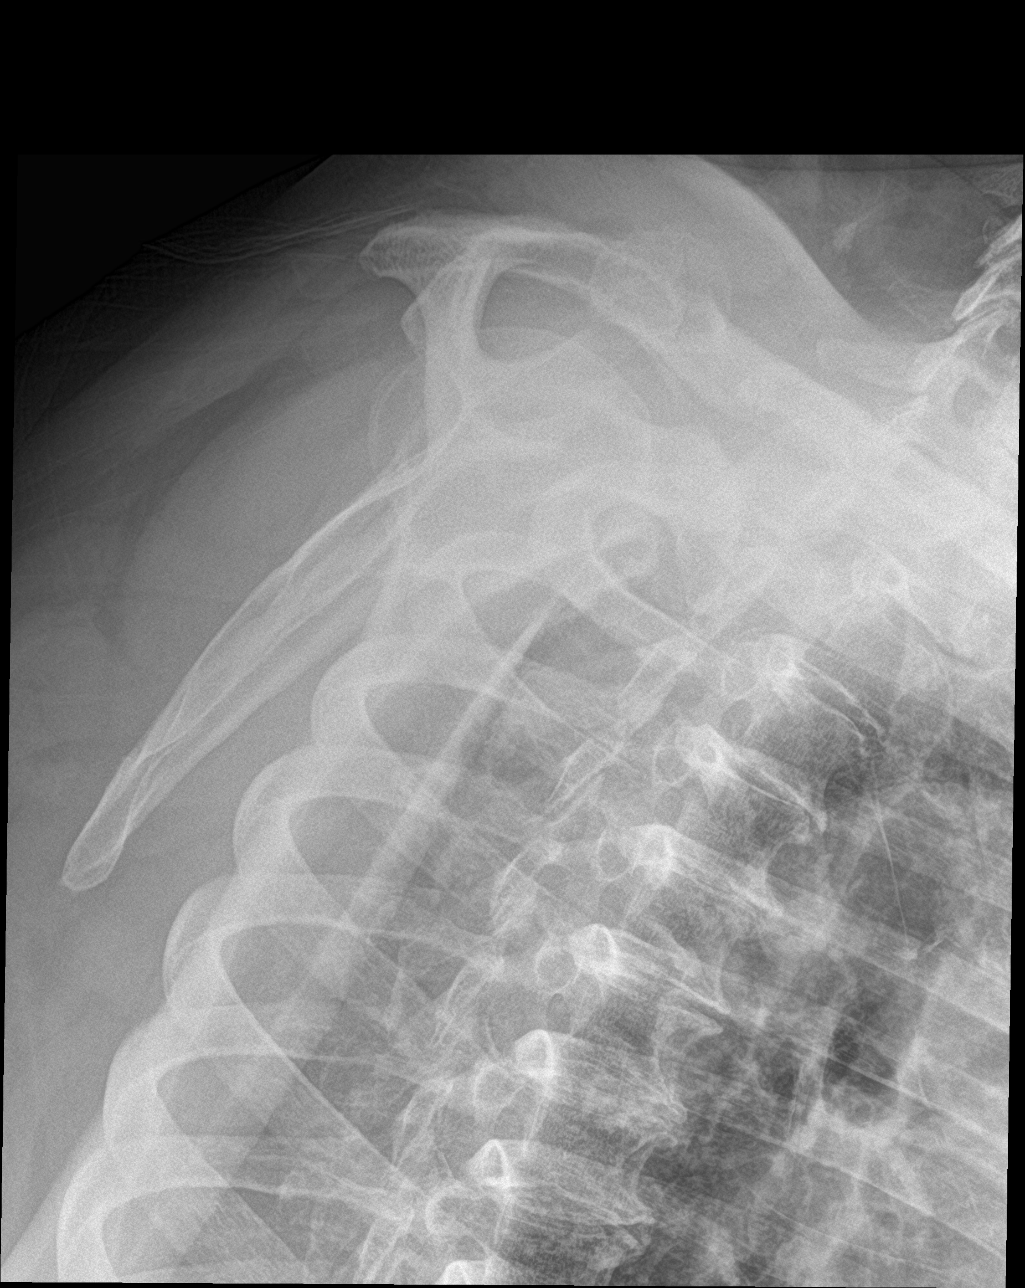

[shoulder axillary]
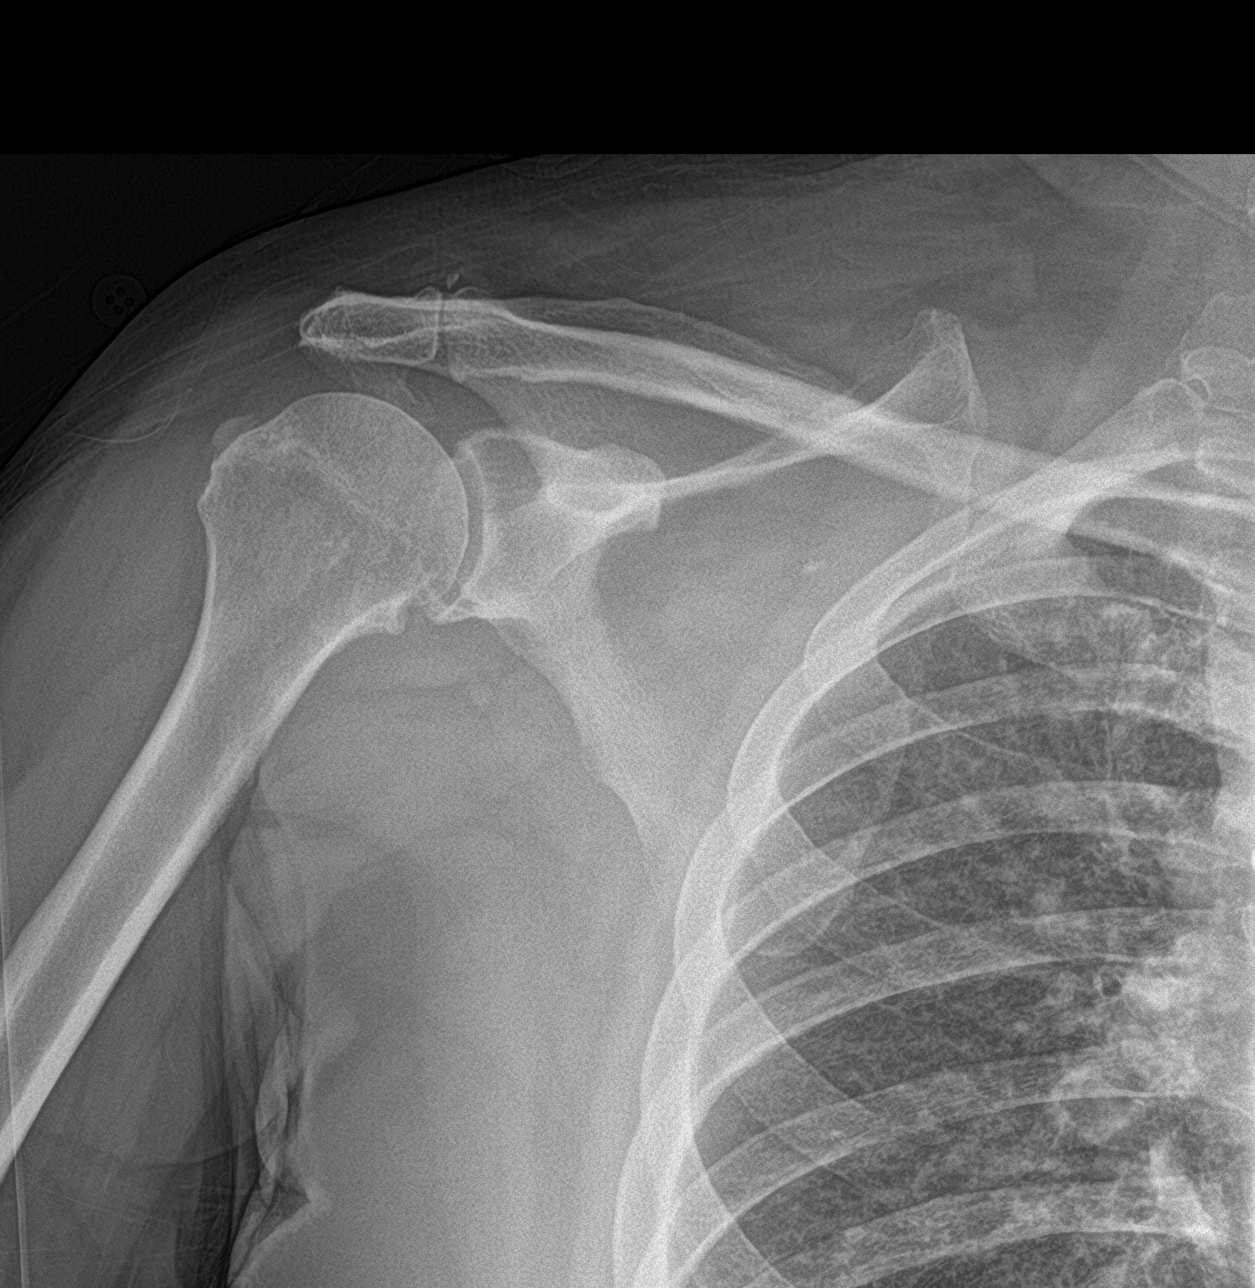

[3 of 3 positions shown; findings below may reference images not displayed]

FINDINGS: No acute fracture or dislocation. Mild glenohumeral and
acromioclavicular joint space narrowing with small marginal
osteophytes. Large subacromial spur. Amorphous calcification in the
region of the posterior greater tuberosity. Bone mineralization is
normal. Soft tissues are unremarkable.
IMPRESSION: 1. No acute osseous abnormality.
2. Mild glenohumeral and acromioclavicular osteoarthritis.
3. Calcific tendinitis of the rotator cuff.

## 2020-11-18 IMAGING — CT CT HEAD W/O CM
3 series · 16 of 47 positions shown, 19 images · non-contrast
Comparison: Head CT [DATE]

CLINICAL DATA: Witnessed syncopal episode, complaining of dizziness
and weakness.

EXAM:
CT HEAD WITHOUT CONTRAST
TECHNIQUE: Contiguous axial images were obtained from the base of the skull
through the vertex without intravenous contrast.

[Series 2: head wo · axial · 0.45mm/px · z∈[-180,-30]mm · 10 of 36 slices shown, 13 images]
[im 3/36  brain]
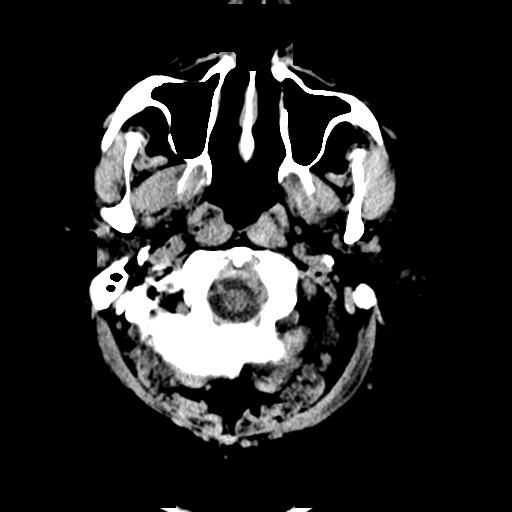
[im 3/36  bone]
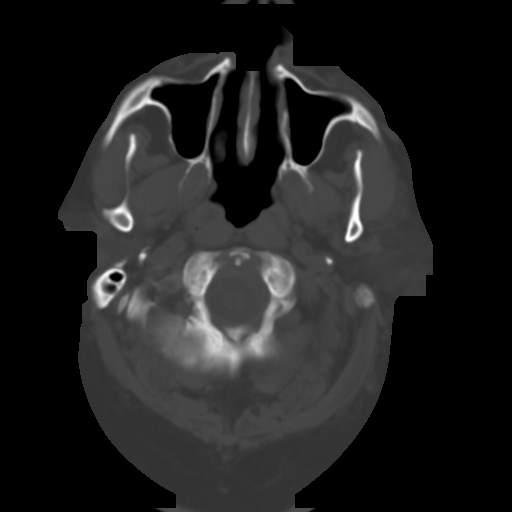
[im 7/36  brain]
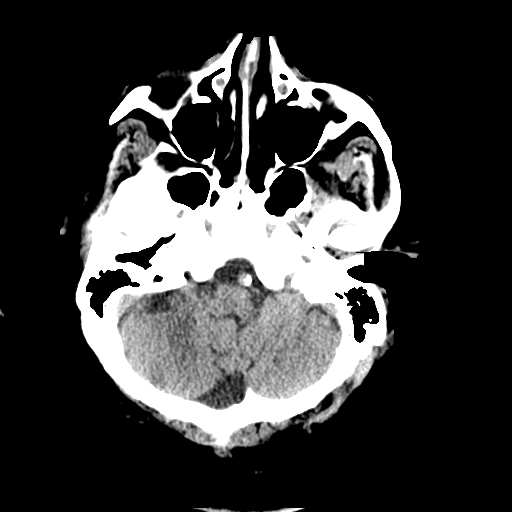
[im 10/36  brain]
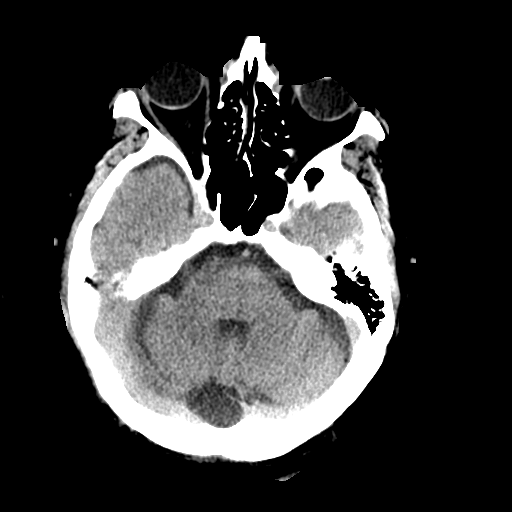
[im 13/36  brain]
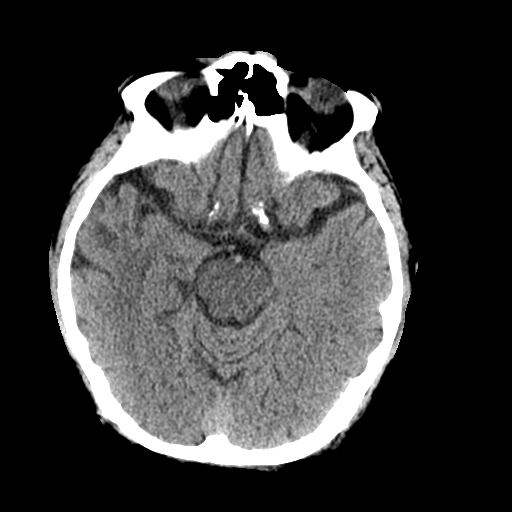
[im 16/36  brain]
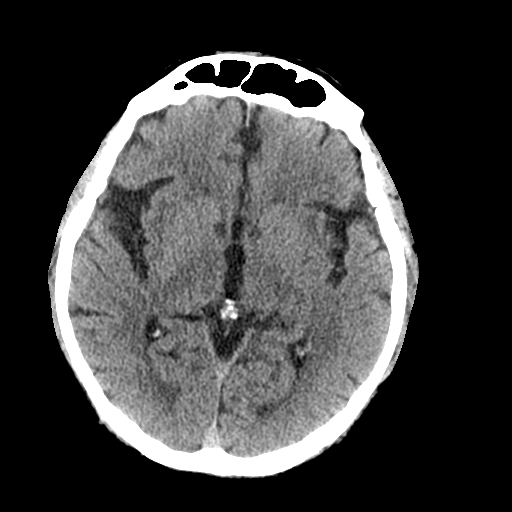
[im 16/36  bone]
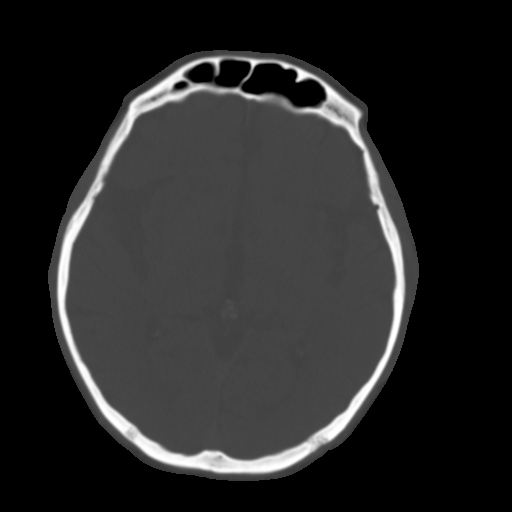
[im 20/36  brain]
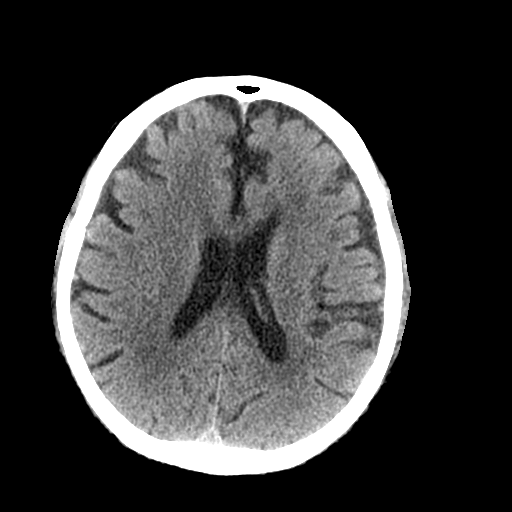
[im 23/36  brain]
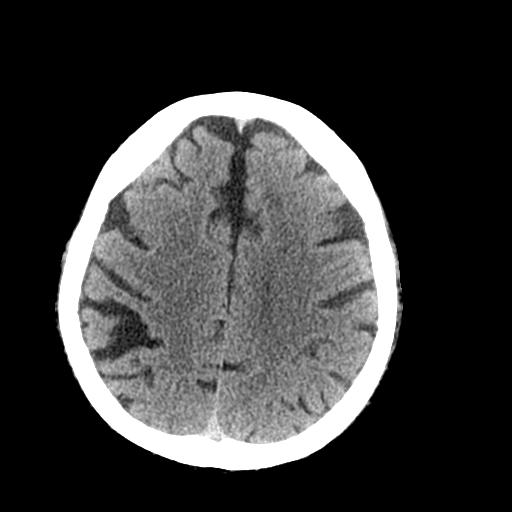
[im 27/36  brain]
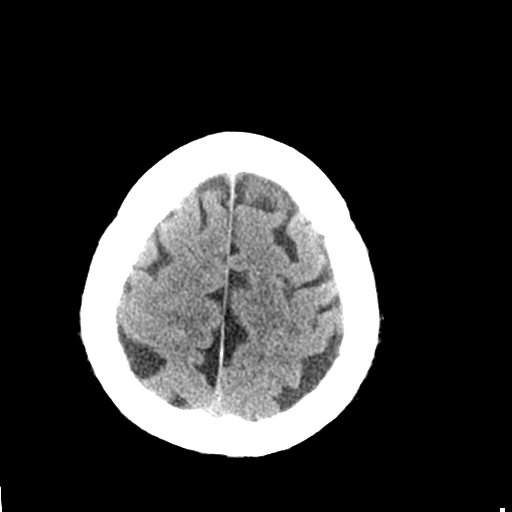
[im 29/36  brain]
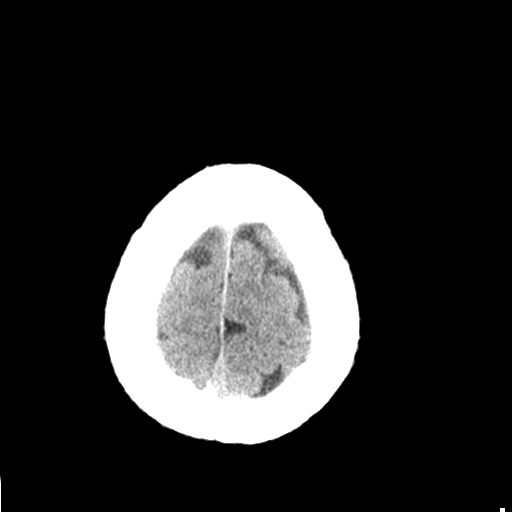
[im 29/36  bone]
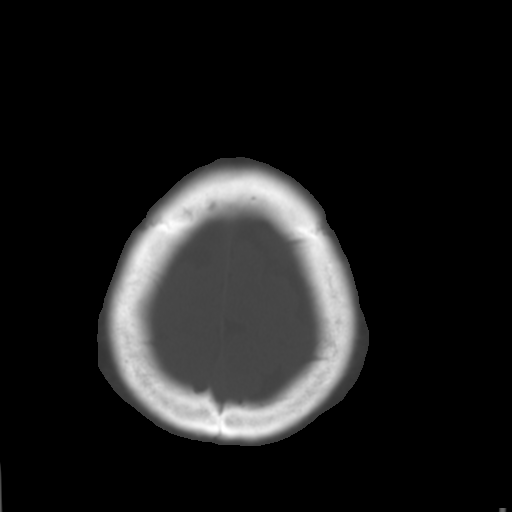
[im 33/36  brain]
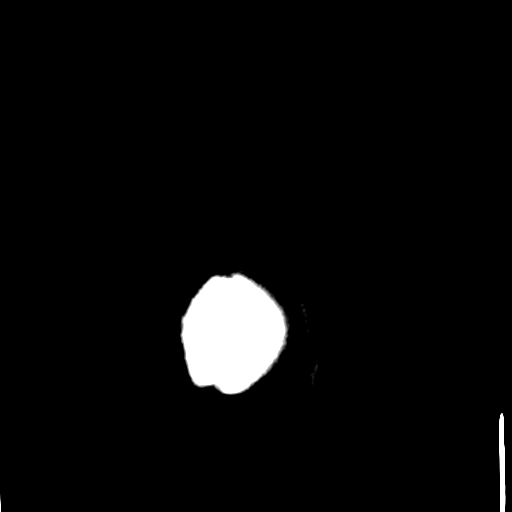

[Series 4: coronal soft tissue · coronal · 0.34mm/px · 3 of 69 slices shown]
[im 25/69  brain]
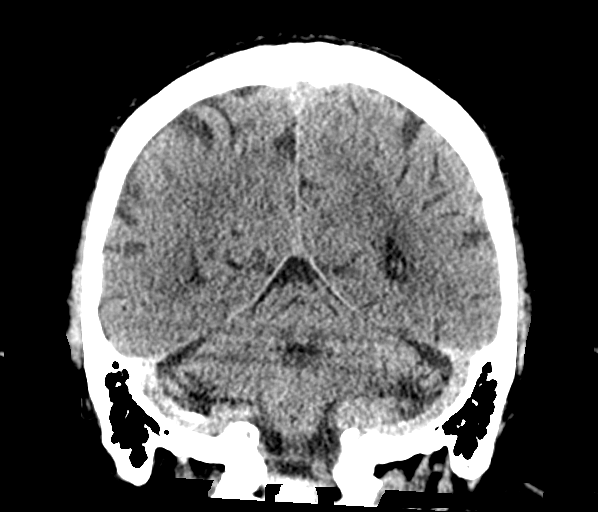
[im 31/69  brain]
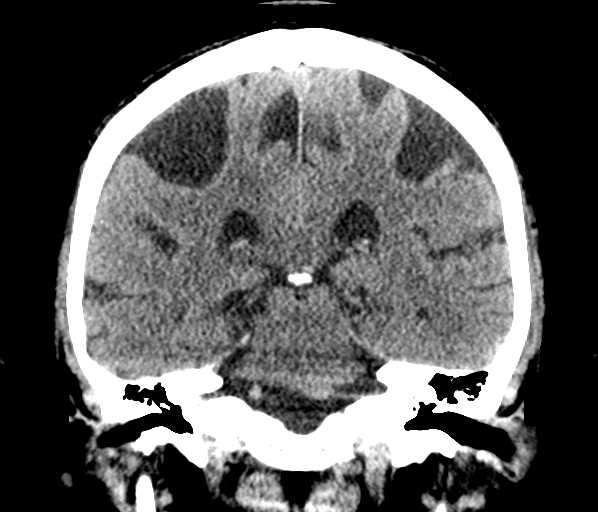
[im 38/69  brain]
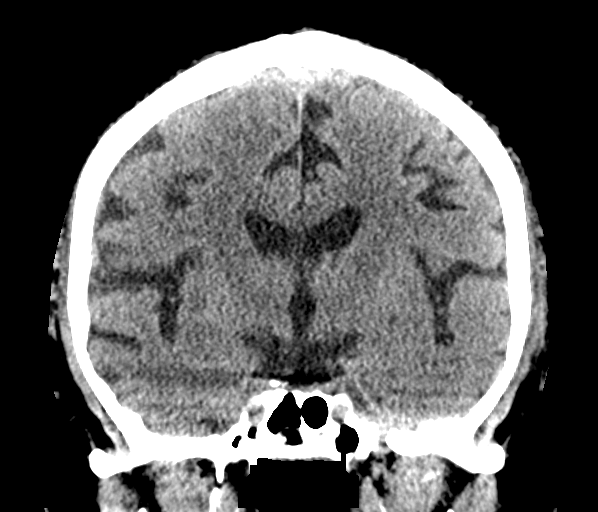

[Series 5: sagittal soft tissue · sagittal · 0.34mm/px · 3 of 67 slices shown]
[im 23/67  brain]
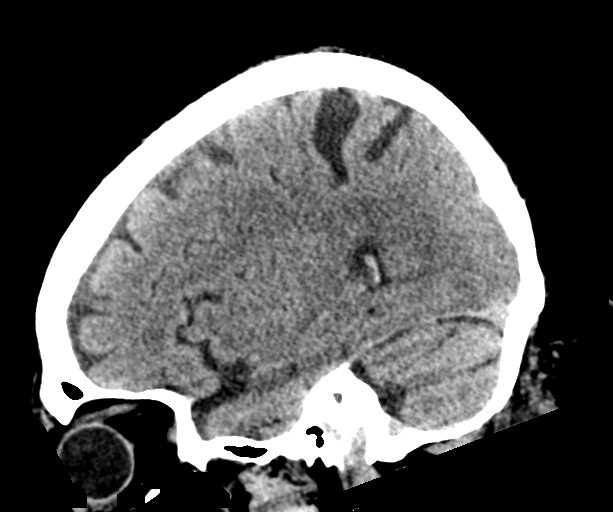
[im 34/67  brain]
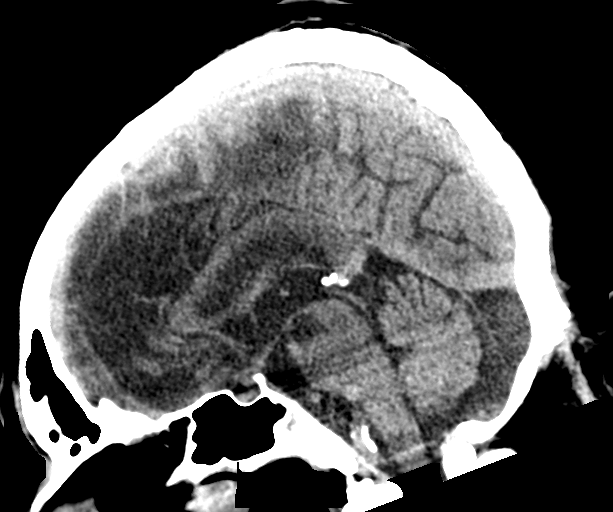
[im 45/67  brain]
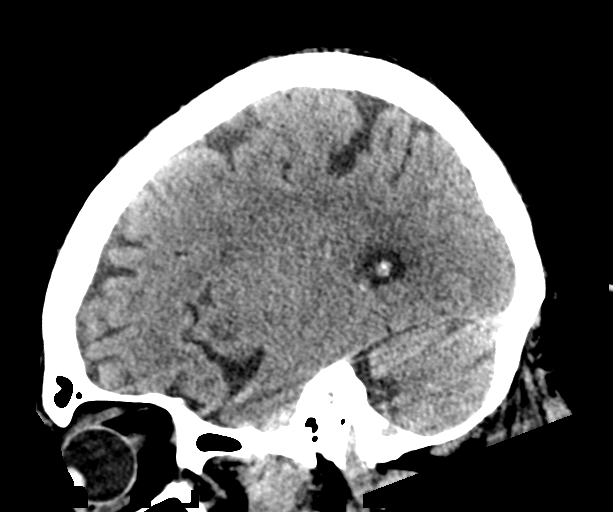

[16 of 47 positions shown; findings below may reference images not displayed]

FINDINGS: Brain: No evidence of acute infarction, hemorrhage, hydrocephalus,
extra-axial collection or mass lesion/mass effect. Mega cisterna
magna, normal variant.

Vascular: No hyperdense vessel. Atherosclerotic vascular
calcification.

Skull: Normal. Negative for fracture or focal lesion.

Sinuses/Orbits: Mild mucosal thickening of the maxillary sinuses.

Other: None.
IMPRESSION: 1. No acute intracranial pathology.
2. Mild mucosal thickening of the maxillary sinuses.

## 2020-11-18 MED ORDER — METHIMAZOLE 5 MG PO TABS
5.0000 mg | ORAL_TABLET | Freq: Every day | ORAL | Status: DC
  Administered 2020-11-19 – 2020-11-20 (×2): 5 mg via ORAL
  Filled 2020-11-18 (×2): qty 1

## 2020-11-18 MED ORDER — ONDANSETRON HCL 4 MG PO TABS: 4.0000 mg | ORAL_TABLET | Freq: Four times a day (QID) | ORAL | Status: DC | PRN

## 2020-11-18 MED ORDER — ATORVASTATIN CALCIUM 20 MG PO TABS
40.0000 mg | ORAL_TABLET | Freq: Every day | ORAL | Status: DC
  Administered 2020-11-18 – 2020-11-19 (×2): 40 mg via ORAL
  Filled 2020-11-18 (×2): qty 2

## 2020-11-18 MED ORDER — MECLIZINE HCL 12.5 MG PO TABS
12.5000 mg | ORAL_TABLET | Freq: Three times a day (TID) | ORAL | Status: DC | PRN
  Filled 2020-11-18: qty 1

## 2020-11-18 MED ORDER — GLIPIZIDE 10 MG PO TABS: 10.0000 mg | ORAL_TABLET | Freq: Two times a day (BID) | ORAL | Status: DC

## 2020-11-18 MED ORDER — DILTIAZEM HCL ER COATED BEADS 120 MG PO CP24
120.0000 mg | ORAL_CAPSULE | Freq: Every day | ORAL | Status: DC
  Administered 2020-11-19: 09:00:00 120 mg via ORAL
  Filled 2020-11-18: qty 1

## 2020-11-18 MED ORDER — ASPIRIN EC 81 MG PO TBEC
81.0000 mg | DELAYED_RELEASE_TABLET | Freq: Every day | ORAL | Status: DC
  Administered 2020-11-18 – 2020-11-20 (×3): 81 mg via ORAL
  Filled 2020-11-18 (×3): qty 1

## 2020-11-18 MED ORDER — SODIUM CHLORIDE 0.9 % IV BOLUS
1000.0000 mL | Freq: Once | INTRAVENOUS | Status: AC
  Administered 2020-11-18: 1000 mL via INTRAVENOUS

## 2020-11-18 MED ORDER — FUROSEMIDE 40 MG PO TABS
40.0000 mg | ORAL_TABLET | Freq: Every day | ORAL | Status: DC
  Administered 2020-11-19: 09:00:00 40 mg via ORAL
  Filled 2020-11-18: qty 1

## 2020-11-18 MED ORDER — DUTASTERIDE 0.5 MG PO CAPS
0.5000 mg | ORAL_CAPSULE | Freq: Every day | ORAL | Status: DC
  Administered 2020-11-19 – 2020-11-20 (×2): 0.5 mg via ORAL
  Filled 2020-11-18 (×2): qty 1

## 2020-11-18 MED ORDER — SUCRALFATE 1 G PO TABS
1.0000 g | ORAL_TABLET | Freq: Three times a day (TID) | ORAL | Status: DC
  Administered 2020-11-19 – 2020-11-20 (×4): 1 g via ORAL
  Filled 2020-11-18 (×4): qty 1

## 2020-11-18 MED ORDER — MAGNESIUM HYDROXIDE 400 MG/5ML PO SUSP
30.0000 mL | Freq: Every day | ORAL | Status: DC | PRN
  Filled 2020-11-18: qty 30

## 2020-11-18 MED ORDER — SODIUM CHLORIDE 0.9 % IV SOLN: Freq: Once | INTRAVENOUS | Status: DC

## 2020-11-18 MED ORDER — DEXTROSE 50 % IV SOLN
INTRAVENOUS | Status: AC
  Administered 2020-11-18: 12.5 g via INTRAVENOUS
  Filled 2020-11-18: qty 50

## 2020-11-18 MED ORDER — ONDANSETRON HCL 4 MG/2ML IJ SOLN: 4.0000 mg | Freq: Four times a day (QID) | INTRAMUSCULAR | Status: DC | PRN

## 2020-11-18 MED ORDER — VENLAFAXINE HCL ER 75 MG PO CP24
150.0000 mg | ORAL_CAPSULE | Freq: Every day | ORAL | Status: DC
  Administered 2020-11-19 – 2020-11-20 (×2): 150 mg via ORAL
  Filled 2020-11-18: qty 2
  Filled 2020-11-18: qty 1
  Filled 2020-11-18: qty 2

## 2020-11-18 MED ORDER — INSULIN ASPART 100 UNIT/ML ~~LOC~~ SOLN: 0.0000 [IU] | Freq: Three times a day (TID) | SUBCUTANEOUS | Status: DC

## 2020-11-18 MED ORDER — BUPROPION HCL ER (SR) 150 MG PO TB12
150.0000 mg | ORAL_TABLET | Freq: Two times a day (BID) | ORAL | Status: DC
  Administered 2020-11-18 – 2020-11-20 (×4): 150 mg via ORAL
  Filled 2020-11-18 (×5): qty 1

## 2020-11-18 MED ORDER — DULAGLUTIDE 1.5 MG/0.5ML ~~LOC~~ SOAJ: 1.5000 mg | SUBCUTANEOUS | Status: DC

## 2020-11-18 MED ORDER — ADULT MULTIVITAMIN W/MINERALS CH
1.0000 | ORAL_TABLET | Freq: Every day | ORAL | Status: DC
  Administered 2020-11-19 – 2020-11-20 (×2): 1 via ORAL
  Filled 2020-11-18 (×2): qty 1

## 2020-11-18 MED ORDER — TRAZODONE HCL 50 MG PO TABS
25.0000 mg | ORAL_TABLET | Freq: Every evening | ORAL | Status: DC | PRN
  Administered 2020-11-19: 25 mg via ORAL
  Filled 2020-11-18: qty 1

## 2020-11-18 MED ORDER — PANTOPRAZOLE SODIUM 40 MG PO TBEC
40.0000 mg | DELAYED_RELEASE_TABLET | Freq: Every day | ORAL | Status: DC
  Administered 2020-11-19 – 2020-11-20 (×2): 40 mg via ORAL
  Filled 2020-11-18 (×2): qty 1

## 2020-11-18 MED ORDER — SODIUM CHLORIDE 0.9 % IV BOLUS
500.0000 mL | Freq: Once | INTRAVENOUS | Status: AC
  Administered 2020-11-18: 500 mL via INTRAVENOUS

## 2020-11-18 MED ORDER — TAMSULOSIN HCL 0.4 MG PO CAPS
0.8000 mg | ORAL_CAPSULE | Freq: Every day | ORAL | Status: DC
  Administered 2020-11-19: 13:00:00 0.8 mg via ORAL
  Filled 2020-11-18: qty 2

## 2020-11-18 MED ORDER — SODIUM CHLORIDE 0.9 % IV SOLN: Freq: Once | INTRAVENOUS | Status: AC

## 2020-11-18 MED ORDER — ACETAMINOPHEN 650 MG RE SUPP: 650.0000 mg | Freq: Four times a day (QID) | RECTAL | Status: DC | PRN

## 2020-11-18 MED ORDER — ACETAMINOPHEN 325 MG PO TABS: 650.0000 mg | ORAL_TABLET | Freq: Four times a day (QID) | ORAL | Status: DC | PRN

## 2020-11-18 MED ORDER — ENOXAPARIN SODIUM 40 MG/0.4ML ~~LOC~~ SOLN: 40.0000 mg | SUBCUTANEOUS | Status: DC

## 2020-11-18 MED ORDER — GABAPENTIN 300 MG PO CAPS
400.0000 mg | ORAL_CAPSULE | Freq: Every day | ORAL | Status: DC
  Administered 2020-11-19 – 2020-11-20 (×2): 400 mg via ORAL
  Filled 2020-11-18 (×2): qty 1

## 2020-11-18 MED ORDER — INSULIN DETEMIR 100 UNIT/ML ~~LOC~~ SOLN
28.0000 [IU] | Freq: Two times a day (BID) | SUBCUTANEOUS | Status: DC
  Filled 2020-11-18 (×3): qty 0.28

## 2020-11-18 MED ORDER — INSULIN NPH (HUMAN) (ISOPHANE) 100 UNIT/ML ~~LOC~~ SUSP: 28.0000 [IU] | Freq: Two times a day (BID) | SUBCUTANEOUS | Status: DC

## 2020-11-18 MED ORDER — SERTRALINE HCL 50 MG PO TABS
100.0000 mg | ORAL_TABLET | Freq: Every day | ORAL | Status: DC
  Administered 2020-11-19 – 2020-11-20 (×2): 100 mg via ORAL
  Filled 2020-11-18 (×2): qty 2

## 2020-11-18 MED ORDER — DEXTROSE 50 % IV SOLN: 12.5000 g | Freq: Once | INTRAVENOUS | Status: AC

## 2020-11-18 MED ORDER — SODIUM CHLORIDE 0.9 % IV SOLN: INTRAVENOUS | Status: DC

## 2020-11-18 MED ORDER — AMIODARONE HCL 200 MG PO TABS
200.0000 mg | ORAL_TABLET | Freq: Every day | ORAL | Status: DC
  Administered 2020-11-19 – 2020-11-20 (×2): 200 mg via ORAL
  Filled 2020-11-18 (×2): qty 1

## 2020-11-18 NOTE — ED Notes (Signed)
Pt taken to decon shower and was able to stand to urinate into urinal without difficulty.

## 2020-11-18 NOTE — ED Provider Notes (Signed)
[facility] Emergency Department Provider Note    Event Date/Time   First MD Initiated Contact with Patient 11/18/20 1553     (approximate)  I have reviewed the triage vital signs and the nursing notes.   HISTORY  Chief Complaint Loss of Consciousness    HPI Tom Moore is a 75 y.o. male with the below listed past medical history presents to the ER for syncopal episode that occurred today.  States he has been feeling a little bit unwell over the past 2 days but woke up this morning feeling fine.  He  states that similar presentation to when he had a fainting spell 2 weeks ago and was admitted to the hospital.  States he has a history of a weak heart but recent echo showed normal EF.  He is denying any chest pain or pressure.  Denies any nausea or vomiting.  Denies any lower extremity swelling.  Has not made any new medication changes.  States he been compliant with his medications.  States he did lose consciousness and cannot recall whether he struck his head.  Does not know how long he was unresponsive.   Past Medical History:  Diagnosis Date  . Anxiety   . Arthritis   . CHF (congestive heart failure) ([place])   . Depression   . Diabetes mellitus without complication ([place])   . Dysrhythmia    atrial fib  . GERD (gastroesophageal reflux disease)   . Hyperlipidemia   . Hypertension   . Hyperthyroidism   . Insomnia   . Neuromuscular disorder ([place])    diabetic neuropathy  . Orthopnea    uses extra pillow to sleep  . Sleep apnea    unable to tolerater CPAP   Family History  Problem Relation Age of Onset  . Hypertension Mother   . Heart disease Mother    Past Surgical History:  Procedure Laterality Date  . CARDIAC CATHETERIZATION    . CARPAL TUNNEL RELEASE Right 04/10/2018   Procedure: RELEASE MEDIAN NERVE AT CARPAL TUNNEL;  Surgeon: Earnestine Leys, MD;  Location: [facility] ORS;  Service: Orthopedics;  Laterality: Right;  . CARPAL TUNNEL RELEASE  Left 05/08/2018   Procedure: CARPAL TUNNEL RELEASE;  Surgeon: Earnestine Leys, MD;  Location: [facility] ORS;  Service: Orthopedics;  Laterality: Left;  . CATARACT EXTRACTION W/PHACO Right 08/16/2019   Procedure: CATARACT EXTRACTION PHACO AND INTRAOCULAR LENS PLACEMENT ([place]) RIGHT;  Surgeon: Marchia Meiers, MD;  Location: [facility] ORS;  Service: Ophthalmology;  Laterality: Right;  [place] 01:30.3 CDE 13.71 Fluid Pack lot # [id number]  . COLONOSCOPY WITH PROPOFOL N/A 06/04/2020   Procedure: COLONOSCOPY WITH PROPOFOL;  Surgeon: Virgel Manifold, MD;  Location: [facility] ENDOSCOPY;  Service: Endoscopy;  Laterality: N/A;  . ESOPHAGOGASTRODUODENOSCOPY (EGD) WITH PROPOFOL N/A 06/04/2020   Procedure: ESOPHAGOGASTRODUODENOSCOPY (EGD) WITH PROPOFOL;  Surgeon: Virgel Manifold, MD;  Location: [facility] ENDOSCOPY;  Service: Endoscopy;  Laterality: N/A;  . TONSILLECTOMY     Patient Active Problem List   Diagnosis Date Noted  . Chronic systolic CHF (congestive heart failure) ([place]) 11/05/2020  . Syncope 11/04/2020  . Chronic kidney disease, stage 3b ([place]) 11/04/2020  . Benign prostatic hyperplasia with lower urinary tract symptoms 08/27/2020  . Screening for colon cancer   . Polyp of colon   . Early satiety   . Non-intractable vomiting   . Stasis dermatitis of both legs 06/09/2018  . Bilateral carpal tunnel syndrome 11/25/2017  . Nocturia 08/17/2017  . CKD stage 3 due to type 2  diabetes mellitus ([place]) 05/05/2017  . Heart failure with reduced ejection fraction ([place]) 11/30/2016  . AF (paroxysmal atrial fibrillation) ([place]) 11/30/2016  . Subclinical hyperthyroidism 11/30/2016  . Insomnia 05/20/2015  . Depression 05/20/2015  . Mixed hyperlipidemia 05/20/2015  . Anxiety 05/20/2015  . Erectile dysfunction 05/20/2015  . Essential hypertension 05/20/2015  . Type 2 diabetes mellitus without complication, with long-term current use of insulin ([place]) 05/20/2015  . GERD without esophagitis 05/20/2015  . Osteoarthritis, knee  05/20/2015      Prior to Admission medications   Medication Sig Start Date End Date Taking? Authorizing Provider  atorvastatin (LIPITOR) 40 MG tablet Take 40 mg by mouth daily.    [provider]  buPROPion (WELLBUTRIN SR) 150 MG 12 hr tablet Take 1 tablet (150 mg total) by mouth 2 (two) times daily. 10/22/20   Jerrol Banana., MD  diltiazem [facility] CD) 120 MG 24 hr capsule Take 1 capsule by mouth once daily 11/18/20   Jerrol Banana., MD  dutasteride (AVODART) 0.5 MG capsule Take 1 capsule by mouth once daily 11/16/20   Jerrol Banana., MD  furosemide (LASIX) 40 MG tablet Take 1 tablet (40 mg total) by mouth daily. 11/06/20   Little Ishikawa, MD  gabapentin (NEURONTIN) 400 MG capsule Take 400 mg by mouth daily. AT LUNCH     [provider]  glipiZIDE (GLUCOTROL) 10 MG tablet TAKE ONE TABLET BY MOUTH THREE TIMES DAILY BEFORE MEALS Patient taking differently: 10 mg 2 (two) times daily before a meal. 06/19/18   Burnette, Clearnce Sorrel, PA-C  glucose blood test strip Use 3 (three) times daily One touch ultra test strips e11.29 06/02/18   [provider]  insulin NPH Human (HUMULIN N,NOVOLIN N) 100 UNIT/ML injection 28 Units 2 (two) times daily before a meal.  11/01/16   [provider]  meclizine (ANTIVERT) 25 MG tablet TAKE 1 TABLET BY MOUTH THREE TIMES DAILY AS NEEDED FOR  DIZZINESS 07/16/20   Jerrol Banana., MD  metFORMIN (GLUCOPHAGE) 1000 MG tablet TAKE 1 TABLET BY MOUTH TWICE DAILY WITH A MEAL Patient taking differently: daily with breakfast. 04/27/16   Carmon Ginsberg, PA  methimazole (TAPAZOLE) 5 MG tablet Take 5 mg by mouth. 02/15/17   [provider]  multivitamin-iron-minerals-folic acid (THERAPEUTIC-M) TABS tablet Take 1 tablet by mouth daily.    [provider]  Na Sulfate-K Sulfate-Mg Sulf 17.5-3.13-1.6 GM/177ML SOLN At 5 PM the day before procedure take 1 bottle and 5 hours before procedure take 1  bottle. Patient not taking: No sig reported 05/27/20   Virgel Manifold, MD  omeprazole (PRILOSEC) 40 MG capsule Take 1 capsule by mouth once daily 11/17/20   Jerrol Banana., MD  OZEMPIC, 0.25 OR 0.5 MG/DOSE, 2 MG/1.5ML SOPN 1.5 mLs once a week. Patient not taking: No sig reported 12/15/19   [provider]  PACERONE 200 MG tablet Take by mouth. 06/19/20   [provider]  sacubitril-valsartan (ENTRESTO) 24-26 MG Take by mouth. 06/03/20   [provider]  sertraline (ZOLOFT) 100 MG tablet Take 1 tablet by mouth once daily Patient taking differently: Take 100 mg by mouth daily. 09/26/20   Jerrol Banana., MD  sucralfate (CARAFATE) 1 g tablet TAKE 1 TABLET BY MOUTH THREE TIMES DAILY WITH MEALS 11/16/20   Jerrol Banana., MD  tamsulosin (FLOMAX) 0.4 MG CAPS capsule TAKE 2 CAPSULES BY MOUTH ONCE DAILY WITH LUNCH Patient taking differently: Take 0.8  mg by mouth daily with lunch. 07/16/20   Stoioff, Ronda Fairly, MD  triamcinolone cream (KENALOG) 0.1 % Apply 0.1 application topically 1 day or 1 dose. Patient not taking: No sig reported    [provider]  TRULICITY 1.5 AJ/6.8TL SOPN Inject into the skin once a week.    [provider]  venlafaxine XR (EFFEXOR-XR) 150 MG 24 hr capsule TAKE 1 CAPSULE BY MOUTH ONCE DAILY WITH BREAKFAST Patient taking differently: Take 150 mg by mouth daily with breakfast. 08/29/20   Jerrol Banana., MD    Allergies Zyrtec allergy [cetirizine]    Social History Social History   Tobacco Use  . Smoking status: Former Smoker    Quit date: 04/03/1988    Years since quitting: 32.6  . Smokeless tobacco: Never Used  Vaping Use  . Vaping Use: Never used  Substance Use Topics  . Alcohol use: Not Currently    Comment: Ocassional alcohol use, Drinks wine.  . Drug use: Never    Review of Systems Patient denies headaches, rhinorrhea, blurry vision, numbness, shortness of breath, chest pain, edema,  cough, abdominal pain, nausea, vomiting, diarrhea, dysuria, fevers, rashes or hallucinations unless otherwise stated above in HPI. ____________________________________________   PHYSICAL EXAM:  VITAL SIGNS: Vitals:   11/18/20 1730 11/18/20 1813  BP:  137/76  Pulse:  83  Resp:  16  Temp: 97.7 F (36.5 C)   SpO2:  96%    Constitutional: Alert and oriented.  Eyes: Conjunctivae are normal.  Head: Atraumatic. Nose: No congestion/rhinnorhea. Mouth/Throat: Mucous membranes are moist.   Neck: No stridor. Painless ROM.  Cardiovascular: Normal rate, regular rhythm. Grossly normal heart sounds.  Good peripheral circulation. Respiratory: Normal respiratory effort.  No retractions. Lungs CTAB. Gastrointestinal: Soft and nontender. No distention. No abdominal bruits. No CVA tenderness. Genitourinary:  Musculoskeletal: mild ttp of rue,  No crepitus, no deformity.  Able to rang shoulder.  nv intact distally, No lower extremity tenderness nor edema.  No joint effusions. Neurologic:  Normal speech and language. No gross focal neurologic deficits are appreciated. No facial droop Skin:  Skin is warm, dry and intact. No rash noted. Psychiatric: Mood and affect are normal. Speech and behavior are normal.  ____________________________________________   LABS (all labs ordered are listed, but only abnormal results are displayed)  Results for orders placed or performed during the hospital encounter of 11/18/20 (from the past 24 hour(s))  Basic metabolic panel     Status: Abnormal   Collection Time: 11/18/20  3:40 PM  Result Value Ref Range   Sodium 137 135 - 145 mmol/L   Potassium 4.1 3.5 - 5.1 mmol/L   Chloride 104 98 - 111 mmol/L   CO2 19 (L) 22 - 32 mmol/L   Glucose, Bld 110 (H) 70 - 99 mg/dL   BUN 28 (H) 8 - 23 mg/dL   Creatinine, Ser 1.80 (H) 0.61 - 1.24 mg/dL   Calcium 9.7 8.9 - 10.3 mg/dL   GFR, Estimated 39 (L) >60 mL/min   Anion gap 14 5 - 15  CBC     Status: None   Collection  Time: 11/18/20  3:40 PM  Result Value Ref Range   WBC 6.9 4.0 - 10.5 K/uL   RBC 4.53 4.22 - 5.81 MIL/uL   Hemoglobin 13.9 13.0 - 17.0 g/dL   HCT 40.8 39.0 - 52.0 %   MCV 90.1 80.0 - 100.0 fL   MCH 30.7 26.0 - 34.0 pg   MCHC 34.1 30.0 - 36.0  g/dL   RDW 14.4 11.5 - 15.5 %   Platelets 180 150 - 400 K/uL   nRBC 0.0 0.0 - 0.2 %  Lactic acid, plasma     Status: Abnormal   Collection Time: 11/18/20  4:17 PM  Result Value Ref Range   Lactic Acid, Venous 4.6 (HH) 0.5 - 1.9 mmol/L  Troponin I (High Sensitivity)     Status: None   Collection Time: 11/18/20  4:17 PM  Result Value Ref Range   Troponin I (High Sensitivity) 9 <18 ng/L  Urinalysis, Complete w Microscopic Urine, Clean Catch     Status: Abnormal   Collection Time: 11/18/20  6:46 PM  Result Value Ref Range   Color, Urine YELLOW (A) YELLOW   APPearance CLEAR (A) CLEAR   Specific Gravity, Urine 1.026 1.005 - 1.030   pH 5.0 5.0 - 8.0   Glucose, UA NEGATIVE NEGATIVE mg/dL   Hgb urine dipstick NEGATIVE NEGATIVE   Bilirubin Urine NEGATIVE NEGATIVE   Ketones, ur 5 (A) NEGATIVE mg/dL   Protein, ur NEGATIVE NEGATIVE mg/dL   Nitrite NEGATIVE NEGATIVE   Leukocytes,Ua NEGATIVE NEGATIVE   RBC / HPF 0-5 0 - 5 RBC/hpf   WBC, UA 0-5 0 - 5 WBC/hpf   Bacteria, UA RARE (A) NONE SEEN   Squamous Epithelial / LPF 0-5 0 - 5   Mucus PRESENT    Hyaline Casts, UA PRESENT   Troponin I (High Sensitivity)     Status: None   Collection Time: 11/18/20  6:46 PM  Result Value Ref Range   Troponin I (High Sensitivity) 9 <18 ng/L  Lactic acid, plasma     Status: Abnormal   Collection Time: 11/18/20  6:46 PM  Result Value Ref Range   Lactic Acid, Venous 3.1 (HH) 0.5 - 1.9 mmol/L   ____________________________________________  EKG My review and personal interpretation at Time: 12:38   Indication: syncope  Rate: 85  Rhythm: sinus Axis: normal Other: normal intervals, no stemi, poor r wave  progression ____________________________________________  RADIOLOGY  I personally reviewed all radiographic images ordered to evaluate for the above acute complaints and reviewed radiology reports and findings.  These findings were personally discussed with the patient.  Please see medical record for radiology report.  ____________________________________________   PROCEDURES  Procedure(s) performed:  Procedures    Critical Care performed: no ____________________________________________   INITIAL IMPRESSION / ASSESSMENT AND PLAN / ED COURSE  Pertinent labs & imaging results that were available during my care of the patient were reviewed by me and considered in my medical decision making (see chart for details).   DDX: dysrhythmia, dehydration, sah, iph, seizure, orthostasis, vasovagal  Tom Moore is a 75 y.o. who presents to the ED with syncopal episode uncertain downtime.  Arrives afebrile hemodynamically stable no hypoxia.  He is denying any pain or discomfort.  States he was having some chills but otherwise feels well at this point.  He is denying any prodromal symptoms.  Did not bite his tongue.  No loss of bowel or urinary continence.  CT imaging will be ordered for above differential will give IV fluids as he may be dehydrated.  EKG nonischemic.  Clinical Course as of 11/18/20 1938  Tue Nov 18, 2020  1715 RDW: 14.4 [PR]  1800 Patient with no white count no fever.  Lower suspicion for sepsis.  Given the syncopal episode and a higher suspicion for dehydration possible seizure episode.  Still waiting urinalysis. [PR]  1922 Lactate is clearing.  Remains  normotensive.  At this point will discuss with hospitalist for admission. [PR]    Clinical Course User Index [PR] Merlyn Lot, MD  `  The patient was evaluated in Emergency Department today for the symptoms described in the history of present illness. He/she was evaluated in the context of the global COVID-19  pandemic, which necessitated consideration that the patient might be at risk for infection with the SARS-CoV-2 virus that causes COVID-19. Institutional protocols and algorithms that pertain to the evaluation of patients at risk for COVID-19 are in a state of rapid change based on information released by regulatory bodies including the CDC and federal and state organizations. These policies and algorithms were followed during the patient's care in the ED.  As part of my medical decision making, I reviewed the following data within the [facility] notes reviewed and incorporated, Labs reviewed, notes from prior ED visits and [place] Controlled Substance Database   ____________________________________________   FINAL CLINICAL IMPRESSION(S) / ED DIAGNOSES  Final diagnoses:  Syncope and collapse      NEW MEDICATIONS STARTED DURING THIS VISIT:  New Prescriptions   No medications on file     Note:  This document was prepared using Dragon voice recognition software and may include unintentional dictation errors.    Merlyn Lot, MD 11/18/20 [id number]

## 2020-11-18 NOTE — H&P (Signed)
[place]   PATIENT NAME: Tom Moore    MR#:  [id number]  DATE OF BIRTH:  1945-12-30  DATE OF ADMISSION:  11/18/2020  PRIMARY CARE PHYSICIAN: Jerrol Banana., MD   Patient is coming from: Home  REQUESTING/REFERRING PHYSICIAN: Merlyn Lot, MD  CHIEF COMPLAINT:   Chief Complaint  Patient presents with  . Loss of Consciousness    HISTORY OF PRESENT ILLNESS:  Tom Moore is a 75 y.o. Hispanic male with medical history significant for CHF, type II diabetes mellitus with diabetic neuropathy, GERD, hypertension, dyslipidemia, hypothyroidism and GERD, who presented to the ER with acute onset of syncope.  He stated that he was getting out of his car going to work when this happened.  He had a headache prior to his syncope and admitted to dyspnea after his syncope.  He denied any chest pain or palpitations.  No nausea or vomiting or diarrhea.  No cough or wheezing good dyspnea.  He has been vaccinated for COVID-19.  He denies any fever or chills.  No dysuria, oliguria or hematuria or flank pain.  The patient was recently admitted here on 2/8 with syncope in the setting of orthostatic hypotension and was hydrated while holding off his diuretics with improvement of his blood pressure.  ED Course: When he came to the ER vital signs were within normal. He was not orthostatic. Labs revealed a BUN of 28 with creatinine 1.8 compared to 20/1.46 on 2/9. High-sensitivity troponin I was 9 and later the same and lactic acid was 4.6 and later 3.1. Urinalysis was negative. Blood cultures were drawn. COVID-19 PCR came back negative. Alcohol levels less than 10. EKG as reviewed by me : Showed normal sinus rhythm with rate of 88 with poor R wave progression, left axis deviation and Q waves inferiorly. Imaging: Chest x-ray showed no acute cardiopulmonary disease. Right shoulder x-ray showed mild shoulder arthritis with no acute abnormalities. It showed calcific tendinitis of the rotator  cuff. Noncontrasted head CT scan showed mild mucosal thickening of the maxillary sinus without acute intracranial abnormality.  The patient was given 1.5 L bolus of IV normal saline followed 100 mL/h. He will be admitted to an observation medical monitored bed for further evaluation and management.  PAST MEDICAL HISTORY:   Past Medical History:  Diagnosis Date  . Anxiety   . Arthritis   . CHF (congestive heart failure) ([place])   . Depression   . Diabetes mellitus without complication ([place])   . Dysrhythmia    atrial fib  . GERD (gastroesophageal reflux disease)   . Hyperlipidemia   . Hypertension   . Hyperthyroidism   . Insomnia   . Neuromuscular disorder ([place])    diabetic neuropathy  . Orthopnea    uses extra pillow to sleep  . Sleep apnea    unable to tolerater CPAP    PAST SURGICAL HISTORY:   Past Surgical History:  Procedure Laterality Date  . CARDIAC CATHETERIZATION    . CARPAL TUNNEL RELEASE Right 04/10/2018   Procedure: RELEASE MEDIAN NERVE AT CARPAL TUNNEL;  Surgeon: Earnestine Leys, MD;  Location: [facility] ORS;  Service: Orthopedics;  Laterality: Right;  . CARPAL TUNNEL RELEASE Left 05/08/2018   Procedure: CARPAL TUNNEL RELEASE;  Surgeon: Earnestine Leys, MD;  Location: [facility] OR[facility];  Service: Orthopedics;  Laterality: Left;  . CATARACT EXTRACTION W/PHACO Right 08/16/2019   Procedure: CATARACT EXTRACTION PHACO AND INTRAOCULAR LENS PLACEMENT ([place]) RIGHT;  Surgeon: Marchia Meiers, MD;  Location: [facility] OR[facility];  Service: Ophthalmology;  Laterality: Right;  [place] 01:30.3 CDE 13.71 Fluid Pack lot # [id number]  . COLONOSCOPY WITH PROPOFOL N/A 06/04/2020   Procedure: COLONOSCOPY WITH PROPOFOL;  Surgeon: Virgel Manifold, MD;  Location: [facility];  Service: Endoscopy;  Laterality: N/A;  . ESOPHAGOGASTRODUODENOSCOPY (EGD) WITH PROPOFOL N/A 06/04/2020   Procedure: ESOPHAGOGASTRODUODENOSCOPY (EGD) WITH PROPOFOL;  Surgeon: Virgel Manifold, MD;  Location: [facility] END[facility];  Service: Endoscopy;   Laterality: N/A;  . TONSILLECTOMY      SOCIAL HISTORY:   Social History   Tobacco Use  . Smoking status: Former Smoker    Quit date: 04/03/1988    Years since quitting: 32.6  . Smokeless tobacco: Never Used  Substance Use Topics  . Alcohol use: Not Currently    Comment: Ocassional alcohol use, Drinks wine.    FAMILY HISTORY:   Family History  Problem Relation Age of Onset  . Hypertension Mother   . Heart disease Mother     DRUG ALLERGIES:   Allergies  Allergen Reactions  . Zyrtec Allergy [Cetirizine] Diarrhea    REVIEW OF SYSTEMS:   ROS As per history of present illness. All pertinent systems were reviewed above. Constitutional, HEENT, cardiovascular, respiratory, GI, GU, musculoskeletal, neuro, psychiatric, endocrine, integumentary and hematologic systems were reviewed and are otherwise negative/unremarkable except for positive findings mentioned above in the HPI.   MEDICATIONS AT HOME:   Prior to Admission medications   Medication Sig Start Date End Date Taking? Authorizing Provider  atorvastatin (LIPITOR) 40 MG tablet Take 40 mg by mouth daily.    [provider]  buPROPion (WELLBUTRIN SR) 150 MG 12 hr tablet Take 1 tablet (150 mg total) by mouth 2 (two) times daily. 10/22/20   Jerrol Banana., MD  diltiazem [facility] CD) 120 MG 24 hr capsule Take 1 capsule by mouth once daily 11/18/20   Jerrol Banana., MD  dutasteride (AVODART) 0.5 MG capsule Take 1 capsule by mouth once daily 11/16/20   Jerrol Banana., MD  furosemide (LASIX) 40 MG tablet Take 1 tablet (40 mg total) by mouth daily. 11/06/20   Little Ishikawa, MD  gabapentin (NEURONTIN) 400 MG capsule Take 400 mg by mouth daily. AT LUNCH     [provider]  glipiZIDE (GLUCOTROL) 10 MG tablet TAKE ONE TABLET BY MOUTH THREE TIMES DAILY BEFORE MEALS Patient taking differently: 10 mg 2 (two) times daily before a meal. 06/19/18   Burnette, Clearnce Sorrel, PA-C  glucose blood test  strip Use 3 (three) times daily One touch ultra test strips e11.29 06/02/18   [provider]  insulin NPH Human (HUMULIN N,NOVOLIN N) 100 UNIT/ML injection 28 Units 2 (two) times daily before a meal.  11/01/16   [provider]  meclizine (ANTIVERT) 25 MG tablet TAKE 1 TABLET BY MOUTH THREE TIMES DAILY AS NEEDED FOR  DIZZINESS 07/16/20   Jerrol Banana., MD  metFORMIN (GLUCOPHAGE) 1000 MG tablet TAKE 1 TABLET BY MOUTH TWICE DAILY WITH A MEAL Patient taking differently: daily with breakfast. 04/27/16   Carmon Ginsberg, PA  methimazole (TAPAZOLE) 5 MG tablet Take 5 mg by mouth. 02/15/17   [provider]  multivitamin-iron-minerals-folic acid (THERAPEUTIC-M) TABS tablet Take 1 tablet by mouth daily.    [provider]  Na Sulfate-K Sulfate-Mg Sulf 17.5-3.13-1.6 GM/177ML SOLN At 5 PM the day before procedure take 1 bottle and 5 hours before procedure take 1 bottle. Patient not taking: No sig reported 05/27/20   Virgel Manifold,  MD  omeprazole (PRILOSEC) 40 MG capsule Take 1 capsule by mouth once daily 11/17/20   Jerrol Banana., MD  OZEMPIC, 0.25 OR 0.5 MG/DOSE, 2 MG/1.5ML SOPN 1.5 mLs once a week. Patient not taking: No sig reported 12/15/19   [provider]  PACERONE 200 MG tablet Take by mouth. 06/19/20   [provider]  sacubitril-valsartan (ENTRESTO) 24-26 MG Take by mouth. 06/03/20   [provider]  sertraline (ZOLOFT) 100 MG tablet Take 1 tablet by mouth once daily Patient taking differently: Take 100 mg by mouth daily. 09/26/20   Jerrol Banana., MD  sucralfate (CARAFATE) 1 g tablet TAKE 1 TABLET BY MOUTH THREE TIMES DAILY WITH MEALS 11/16/20   Jerrol Banana., MD  tamsulosin (FLOMAX) 0.4 MG CAPS capsule TAKE 2 CAPSULES BY MOUTH ONCE DAILY WITH LUNCH Patient taking differently: Take 0.8 mg by mouth daily with lunch. 07/16/20   Stoioff, Ronda Fairly, MD  triamcinolone cream (KENALOG) 0.1 % Apply 0.1 application  topically 1 day or 1 dose. Patient not taking: No sig reported    [provider]  TRULICITY 1.5 YH/0.6CB SOPN Inject into the skin once a week.    [provider]  venlafaxine XR (EFFEXOR-XR) 150 MG 24 hr capsule TAKE 1 CAPSULE BY MOUTH ONCE DAILY WITH BREAKFAST Patient taking differently: Take 150 mg by mouth daily with breakfast. 08/29/20   Jerrol Banana., MD      VITAL SIGNS:  Blood pressure 137/76, pulse 83, temperature 97.7 F (36.5 C), temperature source Oral, resp. rate 16, height 5\' 9"  (1.753 m), weight 101.2 kg, SpO2 96 %.  PHYSICAL EXAMINATION:  Physical Exam  GENERAL:  74 y.o.-year-old Hispanic male patient lying in the bed with no acute distress.  EYES: Pupils equal, round, reactive to light and accommodation. No scleral icterus. Extraocular muscles intact.  HEENT: Head atraumatic, normocephalic. Oropharynx and nasopharynx clear.  NECK:  Supple, no jugular venous distention. No thyroid enlargement, no tenderness.  LUNGS: Normal breath sounds bilaterally, no wheezing, rales,rhonchi or crepitation. No use of accessory muscles of respiration.  CARDIOVASCULAR: Regular rate and rhythm, S1, S2 normal. No murmurs, rubs, or gallops.  ABDOMEN: Soft, nondistended, nontender. Bowel sounds present. No organomegaly or mass.  EXTREMITIES: No pedal edema, cyanosis, or clubbing.  NEUROLOGIC: Cranial nerves II through XII are intact. Muscle strength 5/5 in all extremities. Sensation intact. Gait not checked.  PSYCHIATRIC: The patient is alert and oriented x 3.  Normal affect and good eye contact. SKIN: No obvious rash, lesion, or ulcer.   LABORATORY PANEL:   CBC Recent Labs  Lab 11/18/20 1540  WBC 6.9  HGB 13.9  HCT 40.8  PLT 180   ------------------------------------------------------------------------------------------------------------------  Chemistries  Recent Labs  Lab 11/18/20 1540  NA 137  K 4.1  CL 104  CO2 19*  GLUCOSE 110*  BUN 28*   CREATININE 1.80*  CALCIUM 9.7   ------------------------------------------------------------------------------------------------------------------  Cardiac Enzymes No results for input(s): TROPONINI in the last 168 hours. ------------------------------------------------------------------------------------------------------------------  RADIOLOGY:  DG Chest 2 View  Result Date: 11/18/2020 CLINICAL DATA:  Syncope.  Weakness. EXAM: CHEST - 2 VIEW COMPARISON:  Chest x-ray dated November 04, 2020. FINDINGS: The heart size and mediastinal contours are within normal limits. Both lungs are clear. The visualized skeletal structures are unremarkable. IMPRESSION: No active cardiopulmonary disease. Electronically Signed   By: Titus Dubin M.D.   On: 11/18/2020 17:23   DG Shoulder Right  Result Date: 11/18/2020 CLINICAL DATA:  Right  shoulder pain after fall. EXAM: RIGHT SHOULDER - 2+ VIEW COMPARISON:  None. FINDINGS: No acute fracture or dislocation. Mild glenohumeral and acromioclavicular joint space narrowing with small marginal osteophytes. Large subacromial spur. Amorphous calcification in the region of the posterior greater tuberosity. Bone mineralization is normal. Soft tissues are unremarkable. IMPRESSION: 1. No acute osseous abnormality. 2. Mild glenohumeral and acromioclavicular osteoarthritis. 3. Calcific tendinitis of the rotator cuff. Electronically Signed   By: Titus Dubin M.D.   On: 11/18/2020 17:25   CT Head Wo Contrast  Result Date: 11/18/2020 CLINICAL DATA:  Witnessed syncopal episode, complaining of dizziness and weakness. EXAM: CT HEAD WITHOUT CONTRAST TECHNIQUE: Contiguous axial images were obtained from the base of the skull through the vertex without intravenous contrast. COMPARISON:  Head CT July 30, 2005 FINDINGS: Brain: No evidence of acute infarction, hemorrhage, hydrocephalus, extra-axial collection or mass lesion/mass effect. Mega cisterna magna, normal variant.  Vascular: No hyperdense vessel. Atherosclerotic vascular calcification. Skull: Normal. Negative for fracture or focal lesion. Sinuses/Orbits: Mild mucosal thickening of the maxillary sinuses. Other: None. IMPRESSION: 1. No acute intracranial pathology. 2. Mild mucosal thickening of the maxillary sinuses. Electronically Signed   By: Dahlia Bailiff MD   On: 11/18/2020 17:32      IMPRESSION AND PLAN:  Active Problems:   Syncope  1. Syncope. -The patient presented to an observation medical monitored bed. -We will follow orthostatics. -The patient will be monitored for arrhythmias. -We will obtain bilateral carotid Doppler. -His most recent 2D echo was on 2/8 revealing an EF of 65 to 70% with grade 1 diastolic dysfunction.  2.  Elevated lactic acid. -The patient has no current clear infectious etiology.  His urinalysis was negative.  His chest x-ray showed no acute cardiopulmonary disease. -I think that this is related to volume depletion and dehydration.  His lactic acid is already improving with hydration.  We will keep following its level.  3.  Essential hypertension. -We will continue Cardizem CD and hold Entresto.  4. Type 2 diabetes mellitus with peripheral neuropathy. -The patient will be placed on supplement coverage with NovoLog. -We will continue Glucotrol and his basal coverage with NPH. -We will hold off his Glucophage. -We will continue Neurontin.  5. Hyperthyroidism. -We will continue methimazole and check TSH.  6. Depression. -We will continue Zoloft and Wellbutrin XL.  7. GERD. -We will continue Carafate and PPI therapy.  8. BPH. -We will continue Avodart and Flomax.  8. Dyslipidemia. -We will continue statin therapy.  DVT prophylaxis: Lovenox. Code Status: full code. Family Communication:  The plan of care was discussed in details with the patient (and family). I answered all questions. The patient agreed to proceed with the above mentioned plan. Further  management will depend upon hospital course. Disposition Plan: Back to previous home environment Consults called: none. All the records are reviewed and case discussed with ED provider.  Status is: Observation  The patient remains OBS appropriate and will d/c before 2 midnights.  Dispo: The patient is from: Home              Anticipated d/c is to: Home              Anticipated d/c date is: 1 day              Patient currently is not medically stable to d/c.   Difficult to place patient No   TOTAL TIME TAKING CARE OF THIS PATIENT: 55 minutes.    Christel Mormon M.D on  11/18/2020 at 8:31 PM  [facility]   From 7 PM-7 AM, contact night-coverage [email or web address]  CC: Primary care physician; Jerrol Banana., MD

## 2020-11-18 NOTE — ED Notes (Signed)
Pt checked on by this RN.  Pt stated he felt his sugar was low, so BGL checked and was 45.  VO for 12.5 g D50 given by EDP Quentin Cornwall.  Medication administered to pt at this time.  Pt also given 8oz of OJ.

## 2020-11-18 NOTE — ED Notes (Signed)
Pt taken to scans

## 2020-11-18 NOTE — ED Triage Notes (Signed)
Pt arrives to ER via [facility] from witnessed syncopal episode. States was in ER 2 weeks ago for same. C/o dizziness and weakness and pain to R shoulder. CBG 151. Arrives with 20 to R AC. Arrives to ER A&O.

## 2020-11-19 DIAGNOSIS — E162 Hypoglycemia, unspecified: Secondary | ICD-10-CM

## 2020-11-19 DIAGNOSIS — R55 Syncope and collapse: Secondary | ICD-10-CM | POA: Diagnosis not present

## 2020-11-19 DIAGNOSIS — N179 Acute kidney failure, unspecified: Secondary | ICD-10-CM

## 2020-11-19 LAB — BASIC METABOLIC PANEL
Anion gap: 7 (ref 5–15)
BUN: 19 mg/dL (ref 8–23)
CO2: 24 mmol/L (ref 22–32)
Calcium: 8.8 mg/dL — ABNORMAL LOW (ref 8.9–10.3)
Chloride: 107 mmol/L (ref 98–111)
Creatinine, Ser: 1.29 mg/dL — ABNORMAL HIGH (ref 0.61–1.24)
GFR, Estimated: 59 mL/min — ABNORMAL LOW (ref >60)
Glucose, Bld: 49 mg/dL — ABNORMAL LOW (ref 70–99)
Potassium: 3.5 mmol/L (ref 3.5–5.1)
Sodium: 138 mmol/L (ref 135–145)

## 2020-11-19 LAB — CBC
HCT: 37.7 % — ABNORMAL LOW (ref 39.0–52.0)
Hemoglobin: 12.5 g/dL — ABNORMAL LOW (ref 13.0–17.0)
MCH: 30.7 pg (ref 26.0–34.0)
MCHC: 33.2 g/dL (ref 30.0–36.0)
MCV: 92.6 fL (ref 80.0–100.0)
Platelets: 148 10*3/uL — ABNORMAL LOW (ref 150–400)
RBC: 4.07 MIL/uL — ABNORMAL LOW (ref 4.22–5.81)
RDW: 14.6 % (ref 11.5–15.5)
WBC: 6.7 10*3/uL (ref 4.0–10.5)
nRBC: 0 % (ref 0.0–0.2)

## 2020-11-19 LAB — GLUCOSE, CAPILLARY
Glucose-Capillary: 60 mg/dL — ABNORMAL LOW (ref 70–99)
Glucose-Capillary: 132 mg/dL — ABNORMAL HIGH (ref 70–99)
Glucose-Capillary: 65 mg/dL — ABNORMAL LOW (ref 70–99)
Glucose-Capillary: 110 mg/dL — ABNORMAL HIGH (ref 70–99)
Glucose-Capillary: 110 mg/dL — ABNORMAL HIGH (ref 70–99)
Glucose-Capillary: 219 mg/dL — ABNORMAL HIGH (ref 70–99)

## 2020-11-19 LAB — LACTIC ACID, PLASMA: Lactic Acid, Venous: 1 mmol/L (ref 0.5–1.9)

## 2020-11-19 LAB — SARS CORONAVIRUS 2 (TAT 6-24 HRS): SARS Coronavirus 2: NEGATIVE

## 2020-11-19 MED ORDER — ENOXAPARIN SODIUM 60 MG/0.6ML ~~LOC~~ SOLN
0.5000 mg/kg | SUBCUTANEOUS | Status: DC
  Administered 2020-11-19: 52.5 mg via SUBCUTANEOUS
  Filled 2020-11-19 (×2): qty 0.6

## 2020-11-19 NOTE — Care Management Obs Status (Signed)
[place] NOTIFICATION   Patient Details  Name: Tom Moore MRN: [id number] Date of Birth: [birth year]   Medicare Observation Status Notification Given:  Yes    Shelbie Hutching, RN 11/19/2020, 10:09 AM

## 2020-11-19 NOTE — Progress Notes (Signed)
PHARMACIST - PHYSICIAN COMMUNICATION  CONCERNING:  Enoxaparin (Lovenox) for DVT Prophylaxis    RECOMMENDATION: Patient was prescribed enoxaprin 40mg  q24 hours for VTE prophylaxis.   Filed Weights   11/18/20 1541 11/18/20 2116  Weight: 101.2 kg (223 lb) 103.9 kg (229 lb 1.6 oz)    Body mass index is 33.83 kg/m.  Estimated Creatinine Clearance: 60.6 mL/min (A) (by C-G formula based on SCr of 1.29 mg/dL (H)).   Based on [place] patient is candidate for enoxaparin 0.5mg /kg TBW SQ every 24 hours based on BMI being >30.  DESCRIPTION: Pharmacy has adjusted enoxaparin dose per [facility] policy.  Patient is now receiving enoxaparin 0.5 mg/kg every 24 hours    Renda Rolls, PharmD, [facility] 11/19/2020 6:02 AM

## 2020-11-19 NOTE — Plan of Care (Signed)
  Problem: Education: Goal: Knowledge of General Education information will improve Description: Including pain rating scale, medication(s)/side effects and non-pharmacologic comfort measures 11/19/2020 1333 by Cristela Blue, RN Outcome: Progressing 11/19/2020 1333 by Cristela Blue, RN Outcome: Progressing   Problem: Health Behavior/Discharge Planning: Goal: Ability to manage health-related needs will improve 11/19/2020 1333 by Cristela Blue, RN Outcome: Progressing 11/19/2020 1333 by Cristela Blue, RN Outcome: Progressing   Problem: Clinical Measurements: Goal: Ability to maintain clinical measurements within normal limits will improve 11/19/2020 1333 by Cristela Blue, RN Outcome: Progressing 11/19/2020 1333 by Cristela Blue, RN Outcome: Progressing Goal: Will remain free from infection 11/19/2020 1333 by Cristela Blue, RN Outcome: Progressing 11/19/2020 1333 by Cristela Blue, RN Outcome: Progressing Goal: Diagnostic test results will improve 11/19/2020 1333 by Cristela Blue, RN Outcome: Progressing 11/19/2020 1333 by Cristela Blue, RN Outcome: Progressing Goal: Respiratory complications will improve 11/19/2020 1333 by Cristela Blue, RN Outcome: Progressing 11/19/2020 1333 by Cristela Blue, RN Outcome: Progressing Goal: Cardiovascular complication will be avoided 11/19/2020 1333 by Cristela Blue, RN Outcome: Progressing 11/19/2020 1333 by Cristela Blue, RN Outcome: Progressing   Problem: Activity: Goal: Risk for activity intolerance will decrease 11/19/2020 1333 by Cristela Blue, RN Outcome: Progressing 11/19/2020 1333 by Cristela Blue, RN Outcome: Progressing   Problem: Nutrition: Goal: Adequate nutrition will be maintained 11/19/2020 1333 by Cristela Blue, RN Outcome: Progressing 11/19/2020 1333 by Cristela Blue, RN Outcome: Progressing   Problem: Coping: Goal: Level of anxiety will decrease 11/19/2020 1333 by Cristela Blue, RN Outcome:  Progressing 11/19/2020 1333 by Cristela Blue, RN Outcome: Progressing   Problem: Elimination: Goal: Will not experience complications related to bowel motility 11/19/2020 1333 by Cristela Blue, RN Outcome: Progressing 11/19/2020 1333 by Cristela Blue, RN Outcome: Progressing Goal: Will not experience complications related to urinary retention 11/19/2020 1333 by Cristela Blue, RN Outcome: Progressing 11/19/2020 1333 by Cristela Blue, RN Outcome: Progressing   Problem: Pain Managment: Goal: General experience of comfort will improve 11/19/2020 1333 by Cristela Blue, RN Outcome: Progressing 11/19/2020 1333 by Cristela Blue, RN Outcome: Progressing   Problem: Safety: Goal: Ability to remain free from injury will improve 11/19/2020 1333 by Cristela Blue, RN Outcome: Progressing 11/19/2020 1333 by Cristela Blue, RN Outcome: Progressing   Problem: Skin Integrity: Goal: Risk for impaired skin integrity will decrease 11/19/2020 1333 by Cristela Blue, RN Outcome: Progressing 11/19/2020 1333 by Cristela Blue, RN Outcome: Progressing   Problem: Education: Goal: Knowledge of condition and prescribed therapy will improve 11/19/2020 1333 by Cristela Blue, RN Outcome: Progressing 11/19/2020 1333 by Cristela Blue, RN Outcome: Progressing   Problem: Cardiac: Goal: Will achieve and/or maintain adequate cardiac output 11/19/2020 1333 by Cristela Blue, RN Outcome: Progressing 11/19/2020 1333 by Cristela Blue, RN Outcome: Progressing   Problem: Physical Regulation: Goal: Complications related to the disease process, condition or treatment will be avoided or minimized 11/19/2020 1333 by Cristela Blue, RN Outcome: Progressing 11/19/2020 1333 by Cristela Blue, RN Outcome: Progressing

## 2020-11-19 NOTE — Progress Notes (Signed)
Inpatient Diabetes Program Recommendations  AACE/ADA: New Consensus Statement on Inpatient Glycemic Control   Target Ranges:  Prepandial:   less than 140 mg/dL      Peak postprandial:   less than 180 mg/dL (1-2 hours)      Critically ill patients:  140 - 180 mg/dL  Results for KENYATTE, CHATMON (MRN [id number]) as of 11/19/2020 07:03  Ref. Range 11/19/2020 04:58  Glucose Latest Ref Range: 70 - 99 mg/dL 49 (L)   Results for GUINN, DELAROSA (MRN [id number]) as of 11/19/2020 07:03  Ref. Range 11/18/2020 20:26 11/18/2020 20:51  Glucose-Capillary Latest Ref Range: 70 - 99 mg/dL 45 (L) 120 (H)  Results for EMORI, KAMAU (MRN [id number]) as of 11/19/2020 07:03  Ref. Range 11/04/2020 10:54  Hemoglobin A1C Latest Ref Range: 4.8 - 5.6 % 7.0 (H)   Review of Glycemic Control  Diabetes history: DM2 Outpatient Diabetes medications: NPH 28 units BID, Glipizide 10 mg BID, Metformin 3606 mg QAM, Trulicity 1.5 mg Qweek Current orders for Inpatient glycemic control: Levemir 28 units BID, Novolog 0-9 units TID with meals  Inpatient Diabetes Program Recommendations:    Insulin: Please consider discontinuing Levemir for now. Once hypoglycemia is resolved and If glucose becomes consistently elevated, may want to consider adding back low dose basal insulin.  Thanks, Barnie Alderman, RN, MSN, CDE Diabetes Coordinator Inpatient Diabetes Program [phone] (Team Pager from 8am to 5pm)

## 2020-11-19 NOTE — TOC Initial Note (Signed)
Transition of Care [facility]) - Initial/Assessment Note    Patient Details  Name: Tom Moore MRN: [id number] Date of Birth: 30-Jun-1946  Transition of Care [facility]) CM/SW Contact:    Shelbie Hutching, RN Phone Number: 11/19/2020, 10:16 AM  Clinical Narrative:                 MOON reviewed with patient at the bedside.  Patient placed under observation for syncope.  Patient reports that he was here 2 weeks ago for the same.  He does have an appointment with Dr. Clayborn Bigness with cardiology, scheduled for tomorrow at the [facility] clinic, he was told last admission that his heart was weak.   Patient is from home where he lives alone, he has family support that lives in the area, a sister, 4 children, 55 grandchildren, and a couple of great grandchildren.  No TOC needs identified at this time.    Expected Discharge Plan: Home/Self Care Barriers to Discharge: Continued Medical Work up   Patient Goals and CMS Choice        Expected Discharge Plan and Services Expected Discharge Plan: Home/Self Care       Living arrangements for the past 2 months: Single Family Home                                      Prior Living Arrangements/Services Living arrangements for the past 2 months: Single Family Home Lives with:: Self Patient language and need for interpreter reviewed:: Yes Do you feel safe going back to the place where you live?: Yes      Need for Family Participation in Patient Care: Yes (Comment) (syncopal episodes) Care giver support system in place?: Yes (comment) (lot of family support)   Criminal Activity/Legal Involvement Pertinent to Current Situation/Hospitalization: No - Comment as needed  Activities of Daily Living Home Assistive Devices/Equipment: None ADL Screening (condition at time of admission) Patient's cognitive ability adequate to safely complete daily activities?: Yes Is the patient deaf or have difficulty hearing?: No Does the patient have difficulty seeing,  even when wearing glasses/contacts?: No Does the patient have difficulty concentrating, remembering, or making decisions?: No Patient able to express need for assistance with ADLs?: Yes Does the patient have difficulty dressing or bathing?: No Independently performs ADLs?: Yes (appropriate for developmental age) Does the patient have difficulty walking or climbing stairs?: No Weakness of Legs: None Weakness of Arms/Hands: None  Permission Sought/Granted Permission sought to share information with : Case Manager,Family Supports Permission granted to share information with : Yes, Verbal Permission Granted  Share Information with NAME: Fabio Bering     Permission granted to share info w Relationship: sister  Permission granted to share info w Contact Information: [phone]  Emotional Assessment Appearance:: Appears stated age Attitude/Demeanor/Rapport: Engaged Affect (typically observed): Accepting Orientation: : Oriented to Self,Oriented to Place,Oriented to  Time,Oriented to Situation Alcohol / Substance Use: Not Applicable Psych Involvement: No (comment)  Admission diagnosis:  Syncope and collapse [R55] Syncope [R55] Patient Active Problem List   Diagnosis Date Noted  . Chronic systolic CHF (congestive heart failure) ([place]) 11/05/2020  . Syncope 11/04/2020  . Chronic kidney disease, stage 3b ([place]) 11/04/2020  . Benign prostatic hyperplasia with lower urinary tract symptoms 08/27/2020  . Screening for colon cancer   . Polyp of colon   . Early satiety   . Non-intractable vomiting   . Stasis dermatitis of both legs 06/09/2018  .  Bilateral carpal tunnel syndrome 11/25/2017  . Nocturia 08/17/2017  . CKD stage 3 due to type 2 diabetes mellitus ([place]) 05/05/2017  . Heart failure with reduced ejection fraction ([place]) 11/30/2016  . AF (paroxysmal atrial fibrillation) ([place]) 11/30/2016  . Subclinical hyperthyroidism 11/30/2016  . Insomnia 05/20/2015  . Depression 05/20/2015  . Mixed  hyperlipidemia 05/20/2015  . Anxiety 05/20/2015  . Erectile dysfunction 05/20/2015  . Essential hypertension 05/20/2015  . Type 2 diabetes mellitus without complication, with long-term current use of insulin ([place]) 05/20/2015  . GERD without esophagitis 05/20/2015  . Osteoarthritis, knee 05/20/2015   PCP:  Jerrol Banana., MD Pharmacy:   [facility] [address], [place] [address] [place] [address] Phone: [phone] Fax: [phone]     Social Determinants of Health (SDOH) Interventions    Readmission Risk Interventions No flowsheet data found.

## 2020-11-19 NOTE — Progress Notes (Signed)
PROGRESS NOTE    Tom Moore  ZOX:[id number] DOB: 23-Aug-1946 DOA: 11/18/2020 PCP: Jerrol Banana., MD    Assessment & Plan:   Active Problems:   Syncope   Tom Moore is a 75 y.o. Hispanic male with medical history significant for CHF, type II diabetes mellitus with diabetic neuropathy, GERD, hypertension, dyslipidemia, hypothyroidism and GERD, who presented to the ER with acute onset of syncope.    He stated that he was getting out of his car going to work when this happened.  He had a headache prior to his syncope and admitted to dyspnea after his syncope.  He denied any chest pain or palpitations.  The patient was recently admitted here on 2/8 with syncope in the setting of orthostatic hypotension and was hydrated while holding off his diuretics with improvement of his blood pressure.   1. Syncope. -orthostatic BP measurement neg for orthostasis.  BG found to be low, which may be the cause of pt's syncope. --Monitor on tele for now  # Hypoglycemia --overnight, pt had 4 episodes of hypoglycemia that improved with D50 or orange juice.  Likely due to home glipizide in the setting of reduced renal function. Plan: --q4h BG checks to monitor for hypoglycemia --hold all insulin --d/c glipizide at discharge  2.  Elevated lactic acid. -The patient has no current clear infectious etiology.  His urinalysis was negative.  His chest x-ray showed no acute cardiopulmonary disease. -I think that this is related to volume depletion and dehydration.  His lactic acid is already improving with hydration.  We will keep following its level.  3.  Essential hypertension. --Hold home BP meds due to soft BP  4. Type 2 diabetes mellitus, well controlled with peripheral neuropathy. --A1c 7.0. --q4h BG checks to monitor for hypoglycemia --hold all insulin due to hypoglycemia --d/c glipizide at discharge  AKI --Cr 1.8 on presentation.  Improved to 1.29 with IVF. --cont  MIVF@75   5. Hyperthyroidism. -cont home methimazole  6. Depression. -cont home Zoloft and Wellbutrin  7. GERD. -We will continue Carafate and PPI therapy.  8. BPH. -We will continue Avodart and Flomax.  8. Dyslipidemia. -We will continue statin therapy.   DVT prophylaxis: Lovenox SQ Code Status: Full code  Family Communication:  Level of care: Med-Surg Dispo:   The patient is from: home Anticipated d/c is to: home Anticipated d/c date is: tomorrow Patient currently is not medically ready to d/c due to: still dropping BG needing monitoring   Subjective and Interval History:  Pt reported doing fine, no more syncope issues.  Have been noted to have low BG.   Objective: Vitals:   11/18/20 2352 11/19/20 0410 11/19/20 0750 11/19/20 1204  BP: 126/68 121/62 108/64 140/68  Pulse: 71 65 67 69  Resp: 16 16 16 20   Temp: 98.3 F (36.8 C) 97.7 F (36.5 C) 97.6 F (36.4 C) 97.9 F (36.6 C)  TempSrc: Oral Oral    SpO2: 97% 99% 96% 98%  Weight:      Height:        Intake/Output Summary (Last 24 hours) at 11/19/2020 1511 Last data filed at 11/19/2020 1500 Gross per 24 hour  Intake 1600 ml  Output 2200 ml  Net -600 ml   Filed Weights   11/18/20 1541 11/18/20 2116  Weight: 101.2 kg 103.9 kg    Examination:   Constitutional: NAD, AAOx3 HEENT: conjunctivae and lids normal, EOMI CV: No cyanosis.   RESP: normal respiratory effort, on RA Extremities: No  effusions, edema in BLE SKIN: warm, dry Neuro: II - XII grossly intact.   Psych: Normal mood and affect.  Appropriate judgement and reason   Data Reviewed: I have personally reviewed following labs and imaging studies  CBC: Recent Labs  Lab 11/18/20 1540 11/19/20 0458  WBC 6.9 6.7  HGB 13.9 12.5*  HCT 40.8 37.7*  MCV 90.1 92.6  PLT 180 607*   Basic Metabolic Panel: Recent Labs  Lab 11/18/20 1540 11/19/20 0458  NA 137 138  K 4.1 3.5  CL 104 107  CO2 19* 24  GLUCOSE 110* 49*  BUN 28* 19   CREATININE 1.80* 1.29*  CALCIUM 9.7 8.8*   GFR: Estimated Creatinine Clearance: 60.6 mL/min (A) (by C-G formula based on SCr of 1.29 mg/dL (H)). Liver Function Tests: No results for input(s): AST, ALT, ALKPHOS, BILITOT, PROT, ALBUMIN in the last 168 hours. No results for input(s): LIPASE, AMYLASE in the last 168 hours. No results for input(s): AMMONIA in the last 168 hours. Coagulation Profile: No results for input(s): INR, PROTIME in the last 168 hours. Cardiac Enzymes: No results for input(s): CKTOTAL, CKMB, CKMBINDEX, TROPONINI in the last 168 hours. BNP (last 3 results) No results for input(s): PROBNP in the last 8760 hours. HbA1C: No results for input(s): HGBA1C in the last 72 hours. CBG: Recent Labs  Lab 11/18/20 2051 11/19/20 0753 11/19/20 0918 11/19/20 1143 11/19/20 1258  GLUCAP 120* 60* 132* 65* 110*   Lipid Profile: No results for input(s): CHOL, HDL, LDLCALC, TRIG, CHOLHDL, LDLDIRECT in the last 72 hours. Thyroid Function Tests: Recent Labs    11/18/20 2131  TSH 0.326*   Anemia Panel: No results for input(s): VITAMINB12, FOLATE, FERRITIN, TIBC, IRON, RETICCTPCT in the last 72 hours. Sepsis Labs: Recent Labs  Lab 11/18/20 1617 11/18/20 1846 11/18/20 2131 11/19/20 0458  LATICACIDVEN 4.6* 3.1* 2.7* 1.0    Recent Results (from the past 240 hour(s))  Blood culture (routine x 2)     Status: None (Preliminary result)   Collection Time: 11/18/20  5:26 PM   Specimen: BLOOD  Result Value Ref Range Status   Specimen Description BLOOD BLOOD LEFT HAND  Final   Special Requests   Final    BOTTLES DRAWN AEROBIC AND ANAEROBIC Blood Culture adequate volume   Culture   Final    NO GROWTH < 24 HOURS Performed at [facility], [address] [place], [place] [address]    Report Status PENDING  Incomplete  Blood culture (routine x 2)     Status: None (Preliminary result)   Collection Time: 11/18/20  5:26 PM   Specimen: BLOOD  Result Value Ref Range  Status   Specimen Description BLOOD BLOOD RIGHT HAND  Final   Special Requests   Final    BOTTLES DRAWN AEROBIC AND ANAEROBIC Blood Culture adequate volume   Culture   Final    NO GROWTH < 24 HOURS Performed at [facility], [address] [place], [place] [address]    Report Status PENDING  Incomplete  SARS CORONAVIRUS 2 (TAT 6-24 HRS) Nasopharyngeal Nasopharyngeal Swab     Status: None   Collection Time: 11/18/20  7:32 PM   Specimen: Nasopharyngeal Swab  Result Value Ref Range Status   SARS Coronavirus 2 NEGATIVE NEGATIVE Final    Comment: (NOTE) SARS-CoV-2 target nucleic acids are NOT DETECTED.  The SARS-CoV-2 RNA is generally detectable in upper and lower respiratory specimens during the acute phase of infection. Negative results do not preclude SARS-CoV-2 infection,  do not rule out co-infections with other pathogens, and should not be used as the sole basis for treatment or other patient management decisions. Negative results must be combined with clinical observations, patient history, and epidemiological information. The expected result is Negative.  Fact Sheet for Patients: SugarRoll.be  Fact Sheet for Healthcare Providers: https://www.woods-mathews.com/  This test is not yet approved or cleared by the [place] FDA and  has been authorized for detection and/or diagnosis of SARS-CoV-2 by FDA under an Emergency Use Authorization (EUA). This EUA will remain  in effect (meaning this test can be used) for the duration of the COVID-19 declaration under Se ction 564(b)(1) of the Act, 21 U.S.C. section 360bbb-3(b)(1), unless the authorization is terminated or revoked sooner.  Performed at [facility], [place] [address], [place], [place] [address]       Radiology Studies: DG Chest 2 View  Result Date: 11/18/2020 CLINICAL DATA:  Syncope.  Weakness. EXAM: CHEST - 2 VIEW COMPARISON:  Chest x-ray dated November 04, 2020. FINDINGS: The heart size and mediastinal contours are within normal limits. Both lungs are clear. The visualized skeletal structures are unremarkable. IMPRESSION: No active cardiopulmonary disease. Electronically Signed   By: Titus Dubin M.D.   On: 11/18/2020 17:23   DG Shoulder Right  Result Date: 11/18/2020 CLINICAL DATA:  Right shoulder pain after fall. EXAM: RIGHT SHOULDER - 2+ VIEW COMPARISON:  None. FINDINGS: No acute fracture or dislocation. Mild glenohumeral and acromioclavicular joint space narrowing with small marginal osteophytes. Large subacromial spur. Amorphous calcification in the region of the posterior greater tuberosity. Bone mineralization is normal. Soft tissues are unremarkable. IMPRESSION: 1. No acute osseous abnormality. 2. Mild glenohumeral and acromioclavicular osteoarthritis. 3. Calcific tendinitis of the rotator cuff. Electronically Signed   By: Titus Dubin M.D.   On: 11/18/2020 17:25   CT Head Wo Contrast  Result Date: 11/18/2020 CLINICAL DATA:  Witnessed syncopal episode, complaining of dizziness and weakness. EXAM: CT HEAD WITHOUT CONTRAST TECHNIQUE: Contiguous axial images were obtained from the base of the skull through the vertex without intravenous contrast. COMPARISON:  Head CT July 30, 2005 FINDINGS: Brain: No evidence of acute infarction, hemorrhage, hydrocephalus, extra-axial collection or mass lesion/mass effect. Mega cisterna magna, normal variant. Vascular: No hyperdense vessel. Atherosclerotic vascular calcification. Skull: Normal. Negative for fracture or focal lesion. Sinuses/Orbits: Mild mucosal thickening of the maxillary sinuses. Other: None. IMPRESSION: 1. No acute intracranial pathology. 2. Mild mucosal thickening of the maxillary sinuses. Electronically Signed   By: Dahlia Bailiff MD   On: 11/18/2020 17:32     Scheduled Meds: . amiodarone  200 mg Oral Daily  . aspirin EC  81 mg Oral Daily  . atorvastatin  40 mg Oral Daily  . buPROPion   150 mg Oral BID  . dutasteride  0.5 mg Oral Daily  . enoxaparin (LOVENOX) injection  0.5 mg/kg Subcutaneous Q24H  . gabapentin  400 mg Oral Daily  . insulin aspart  0-9 Units Subcutaneous TID PC & HS  . methimazole  5 mg Oral Daily  . multivitamin with minerals  1 tablet Oral Daily  . pantoprazole  40 mg Oral Daily  . sertraline  100 mg Oral Daily  . sucralfate  1 g Oral TID WC  . tamsulosin  0.8 mg Oral Q lunch  . venlafaxine XR  150 mg Oral Q breakfast   Continuous Infusions: . sodium chloride 100 mL/hr at 11/18/20 2117     LOS: 0 days  Enzo Bi, MD [facility] If 7PM-7AM, please contact night-coverage 11/19/2020, 3:11 PM

## 2020-11-20 DIAGNOSIS — R55 Syncope and collapse: Secondary | ICD-10-CM | POA: Diagnosis not present

## 2020-11-20 LAB — CBC
HCT: 42.1 % (ref 39.0–52.0)
Hemoglobin: 14.3 g/dL (ref 13.0–17.0)
MCH: 31.6 pg (ref 26.0–34.0)
MCHC: 34 g/dL (ref 30.0–36.0)
MCV: 93.1 fL (ref 80.0–100.0)
Platelets: 146 10*3/uL — ABNORMAL LOW (ref 150–400)
RBC: 4.52 MIL/uL (ref 4.22–5.81)
RDW: 14.7 % (ref 11.5–15.5)
WBC: 4.8 10*3/uL (ref 4.0–10.5)
nRBC: 0 % (ref 0.0–0.2)

## 2020-11-20 LAB — BASIC METABOLIC PANEL
Anion gap: 6 (ref 5–15)
BUN: 14 mg/dL (ref 8–23)
CO2: 26 mmol/L (ref 22–32)
Calcium: 8.7 mg/dL — ABNORMAL LOW (ref 8.9–10.3)
Chloride: 107 mmol/L (ref 98–111)
Creatinine, Ser: 1.27 mg/dL — ABNORMAL HIGH (ref 0.61–1.24)
GFR, Estimated: 60 mL/min — ABNORMAL LOW (ref >60)
Glucose, Bld: 151 mg/dL — ABNORMAL HIGH (ref 70–99)
Potassium: 3.8 mmol/L (ref 3.5–5.1)
Sodium: 139 mmol/L (ref 135–145)

## 2020-11-20 LAB — GLUCOSE, CAPILLARY
Glucose-Capillary: 168 mg/dL — ABNORMAL HIGH (ref 70–99)
Glucose-Capillary: 71 mg/dL (ref 70–99)
Glucose-Capillary: 85 mg/dL (ref 70–99)

## 2020-11-20 LAB — MAGNESIUM: Magnesium: 2.2 mg/dL (ref 1.7–2.4)

## 2020-11-20 MED ORDER — FUROSEMIDE 40 MG PO TABS: ORAL_TABLET | ORAL | 0 refills | Status: DC

## 2020-11-20 MED ORDER — INSULIN NPH ISOPHANE & REGULAR (70-30) 100 UNIT/ML ~~LOC~~ SUSP: 20.0000 [IU] | Freq: Two times a day (BID) | SUBCUTANEOUS | 11 refills | Status: DC

## 2020-11-20 MED ORDER — DILTIAZEM HCL ER COATED BEADS 120 MG PO CP24: ORAL_CAPSULE | ORAL | 0 refills | Status: DC

## 2020-11-20 NOTE — Discharge Summary (Signed)
Physician Discharge Summary   SHAWNN BOUILLON  male DOB: 1946-02-07  HEN:[id number]  PCP: Jerrol Banana., MD  Admit date: 11/18/2020 Discharge date: 11/20/2020  Admitted From: home Disposition:  home CODE STATUS: Full code  Discharge Instructions    Discharge instructions   Complete by: As directed    Your blood sugars were dropping even without any insulin, like due to buildup of glipizide.  Low blood sugars can cause fainting.  I have discontinued your glipizide.  Also reduce your insulin to 20u twice a day when you eat a meal.  Your blood pressure was also on the low side without having received blood pressure medication during your hospitalization.  Please hold your Cardizem and Lasix until outpatient followup with your doctor.   Dr. Enzo Bi - -      47 Day Unplanned Readmission Risk Score   Flowsheet Row ED to Hosp-Admission (Discharged) from 11/04/2020 in [facility]  30 Day Unplanned Readmission Risk Score (%) 8.97 Filed at 11/06/2020 0801     This score is the patient's risk of an unplanned readmission within 30 days of being discharged (0 -100%). The score is based on dignosis, age, lab data, medications, orders, and past utilization.   Low:  0-14.9   Medium: 15-21.9   High: 22-29.9   Extreme: 30 and above         Hospital Course:  For full details, please see H&P, progress notes, consult notes and ancillary notes.  Briefly,  [employer] Gomezis a 75 y.o.Hispanic malewith medical history significant forCHF, type II diabetes mellitus with diabetic neuropathy, GERD, hypertension, dyslipidemia, hypothyroidism and GERD, who presented to the ER with acute onset of syncope.  He stated that he was getting out of his car going to work when this happened. He had a headache prior to his syncope and admitted to dyspnea after his syncope. He denied any chest pain or palpitations. The patient was recently admitted here on  2/74 with syncope in the setting of orthostatic hypotension and was hydrated while holding off his diuretics with improvement of his blood pressure.   1. Syncope. -orthostatic BP measurement neg for orthostasis.  BG found to be low, which may be the cause of pt's syncope.  # Hypoglycemia 4. Type 2 diabetes mellitus, well controlled with peripheral neuropathy --overnight after presentation, pt had 4 episodes of hypoglycemia that improved with D50 or orange juice.  Likely due to home glipizide in the setting of reduced renal function.  A1c 7.0.  Insulin held during hospitalization.  Pt received q4h BG checks and did not drop for a day prior to discharge.  Home glipizide was discontinued at discharge.  Home 70/30 was reduced to 20u BID (down from 28u BID).  Home metformin continued after discharge.  2.Elevated lactic acid. -The patient has no current clear infectious etiology. His urinalysis was negative. His chest x-ray showed no acute cardiopulmonary disease. lactic acid is already improving with hydration.   3.Essential hypertension. All home BP meds held during hospitalization due to soft BP.  Metop and Entresto were resumed after discharge.  Cardizem and Lasix were held pending outpatient followup.  AKI --Cr 1.8 on presentation.  Improved to 1.29 with IVF.  5. Hyperthyroidism. -cont home methimazole  6. Depression. -cont home Zoloft and Wellbutrin  7. GERD. continue Carafate and PPI therapy.  8. BPH. continue Avodart and Flomax.  8. Dyslipidemia. continue statin therapy.   Discharge Diagnoses:  Active Problems:  Syncope    Discharge Instructions:  Allergies as of 11/20/2020      Reactions   Zyrtec Allergy [cetirizine] Diarrhea      Medication List    STOP taking these medications   glipiZIDE 10 MG tablet Commonly known as: GLUCOTROL   Na Sulfate-K Sulfate-Mg Sulf 17.5-3.13-1.6 GM/177ML Soln   Ozempic (0.25 or 0.5 MG/DOSE) 2 MG/1.5ML  Sopn Generic drug: Semaglutide(0.25 or 0.5MG /DOS)     TAKE these medications   atorvastatin 40 MG tablet Commonly known as: LIPITOR Take 40 mg by mouth daily.   buPROPion 150 MG 12 hr tablet Commonly known as: WELLBUTRIN SR Take 1 tablet (150 mg total) by mouth 2 (two) times daily.   diltiazem 120 MG 24 hr capsule Commonly known as: CARDIZEM CD Hold until followup with your outpatient doctor due to low blood pressure. What changed:   how much to take  how to take this  when to take this  additional instructions   dutasteride 0.5 MG capsule Commonly known as: AVODART Take 1 capsule by mouth once daily   furosemide 40 MG tablet Commonly known as: LASIX Hold until outpatient followup with your doctor due to low blood pressure. What changed:   how much to take  how to take this  when to take this  additional instructions   glucose blood test strip Use 3 (three) times daily One touch ultra test strips e11.29   insulin NPH-regular Human (70-30) 100 UNIT/ML injection Inject 20 Units into the skin 2 (two) times daily with a meal. What changed: how much to take   meclizine 25 MG tablet Commonly known as: ANTIVERT TAKE 1 TABLET BY MOUTH THREE TIMES DAILY AS NEEDED FOR  DIZZINESS   metFORMIN 1000 MG tablet Commonly known as: GLUCOPHAGE TAKE 1 TABLET BY MOUTH TWICE DAILY WITH A MEAL What changed:   how much to take  how to take this  when to take this   methimazole 5 MG tablet Commonly known as: TAPAZOLE Take 5 mg by mouth.   metoprolol tartrate 25 MG tablet Commonly known as: LOPRESSOR Take 25 mg by mouth 2 (two) times daily.   omeprazole 40 MG capsule Commonly known as: PRILOSEC Take 1 capsule by mouth once daily   Pacerone 200 MG tablet Generic drug: amiodarone Take by mouth.   sacubitril-valsartan 24-26 MG Commonly known as: ENTRESTO Take by mouth.   sertraline 100 MG tablet Commonly known as: ZOLOFT Take 1 tablet by mouth once daily    sucralfate 1 g tablet Commonly known as: CARAFATE TAKE 1 TABLET BY MOUTH THREE TIMES DAILY WITH MEALS   tamsulosin 0.4 MG Caps capsule Commonly known as: FLOMAX TAKE 2 CAPSULES BY MOUTH ONCE DAILY WITH LUNCH What changed: See the new instructions.   venlafaxine XR 150 MG 24 hr capsule Commonly known as: EFFEXOR-XR TAKE 1 CAPSULE BY MOUTH ONCE DAILY WITH BREAKFAST        Follow-up Information    Jerrol Banana., MD. Go on 11/27/2020.   Specialty: Family Medicine Why: at 2:40pm; patient needs to be there 15 minutes prior to his appointment Contact information: [address] [place] [address] [phone]               Allergies  Allergen Reactions  . Zyrtec Allergy [Cetirizine] Diarrhea     The results of significant diagnostics from this hospitalization (including imaging, microbiology, ancillary and laboratory) are listed below for reference.   Consultations:   Procedures/Studies: DG Chest 1  View  Result Date: 11/04/2020 CLINICAL DATA:  Syncopal episode. EXAM: CHEST  1 VIEW COMPARISON:  Radiographs 09/08/2017 and 12/18/2015. FINDINGS: 1400 hours. Lordotic positioning. The heart size and mediastinal contours are stable. The lungs are clear. There is no pleural effusion or pneumothorax. No acute osseous findings are evident. Telemetry leads overlie the chest. IMPRESSION: No active cardiopulmonary process. Electronically Signed   By: Richardean Sale M.D.   On: 11/04/2020 14:37   DG Chest 2 View  Result Date: 11/18/2020 CLINICAL DATA:  Syncope.  Weakness. EXAM: CHEST - 2 VIEW COMPARISON:  Chest x-ray dated November 04, 2020. FINDINGS: The heart size and mediastinal contours are within normal limits. Both lungs are clear. The visualized skeletal structures are unremarkable. IMPRESSION: No active cardiopulmonary disease. Electronically Signed   By: Titus Dubin M.D.   On: 11/18/2020 17:23   DG Shoulder Right  Result Date: 11/18/2020 CLINICAL  DATA:  Right shoulder pain after fall. EXAM: RIGHT SHOULDER - 2+ VIEW COMPARISON:  None. FINDINGS: No acute fracture or dislocation. Mild glenohumeral and acromioclavicular joint space narrowing with small marginal osteophytes. Large subacromial spur. Amorphous calcification in the region of the posterior greater tuberosity. Bone mineralization is normal. Soft tissues are unremarkable. IMPRESSION: 1. No acute osseous abnormality. 2. Mild glenohumeral and acromioclavicular osteoarthritis. 3. Calcific tendinitis of the rotator cuff. Electronically Signed   By: Titus Dubin M.D.   On: 11/18/2020 17:25   CT Head Wo Contrast  Result Date: 11/18/2020 CLINICAL DATA:  Witnessed syncopal episode, complaining of dizziness and weakness. EXAM: CT HEAD WITHOUT CONTRAST TECHNIQUE: Contiguous axial images were obtained from the base of the skull through the vertex without intravenous contrast. COMPARISON:  Head CT July 30, 2005 FINDINGS: Brain: No evidence of acute infarction, hemorrhage, hydrocephalus, extra-axial collection or mass lesion/mass effect. Mega cisterna magna, normal variant. Vascular: No hyperdense vessel. Atherosclerotic vascular calcification. Skull: Normal. Negative for fracture or focal lesion. Sinuses/Orbits: Mild mucosal thickening of the maxillary sinuses. Other: None. IMPRESSION: 1. No acute intracranial pathology. 2. Mild mucosal thickening of the maxillary sinuses. Electronically Signed   By: Dahlia Bailiff MD   On: 11/18/2020 17:32   ECHOCARDIOGRAM COMPLETE  Result Date: 11/05/2020    ECHOCARDIOGRAM REPORT   Patient Name:   Tom Moore Date of Exam: 11/04/2020 Medical Rec #:  [id number]        Height:       69.0 in Accession #:    [id number]       Weight:       233.0 lb Date of Birth:  01-07-1946         BSA:          2.205 m Patient Age:    75 years         BP:           106/61 mmHg Patient Gender: M                HR:           73 bpm. Exam Location:  [facility] Procedure: 2D Echo, Cardiac  Doppler and Color Doppler Indications:     R55 Syncope  History:         Patient has no prior history of Echocardiogram examinations.                  Risk Factors:Hypertension, Diabetes, Dyslipidemia and Obesity.                  Sleep apnea. Hyperthyroidism. Congestive  heart failure.  Sonographer:     Wilford Sports Rodgers-Jones Referring Phys:  [id number] Vernelle Emerald Diagnosing Phys: Yolonda Kida MD IMPRESSIONS  1. Left ventricular ejection fraction, by estimation, is 65 to 70%. The left ventricle has normal function. The left ventricle has no regional wall motion abnormalities. Left ventricular diastolic parameters are consistent with Grade I diastolic dysfunction (impaired relaxation).  2. Right ventricular systolic function is normal. The right ventricular size is normal.  3. The mitral valve is normal in structure. No evidence of mitral valve regurgitation.  4. The aortic valve is tricuspid. There is moderate calcification of the aortic valve. There is moderate thickening of the aortic valve. Aortic valve regurgitation is not visualized. Mild to moderate aortic valve sclerosis/calcification is present, without any evidence of aortic stenosis. FINDINGS  Left Ventricle: Left ventricular ejection fraction, by estimation, is 65 to 70%. The left ventricle has normal function. The left ventricle has no regional wall motion abnormalities. The left ventricular internal cavity size was normal in size. There is  no left ventricular hypertrophy. Left ventricular diastolic parameters are consistent with Grade I diastolic dysfunction (impaired relaxation). Right Ventricle: The right ventricular size is normal. No increase in right ventricular wall thickness. Right ventricular systolic function is normal. Left Atrium: Left atrial size was normal in size. Right Atrium: Right atrial size was normal in size. Pericardium: There is no evidence of pericardial effusion. Mitral Valve: The mitral valve is normal in structure. No  evidence of mitral valve regurgitation. Tricuspid Valve: The tricuspid valve is normal in structure. Tricuspid valve regurgitation is trivial. Aortic Valve: The aortic valve is tricuspid. There is moderate calcification of the aortic valve. There is moderate thickening of the aortic valve. There is mild to moderate aortic valve annular calcification. Aortic valve regurgitation is not visualized. Mild to moderate aortic valve sclerosis/calcification is present, without any evidence of aortic stenosis. Pulmonic Valve: The pulmonic valve was normal in structure. Pulmonic valve regurgitation is not visualized. Aorta: The aortic arch was not well visualized. IAS/Shunts: No atrial level shunt detected by color flow Doppler.  LEFT VENTRICLE PLAX 2D LVIDd:         5.05 cm  Diastology LVIDs:         3.00 cm  LV e' medial:    4.90 cm/s LV PW:         1.23 cm  LV E/e' medial:  13.2 LV IVS:        1.13 cm  LV e' lateral:   6.31 cm/s LVOT diam:     2.40 cm  LV E/e' lateral: 10.3 LV SV:         57 LV SV Index:   26 LVOT Area:     4.52 cm  RIGHT VENTRICLE RV Basal diam:  4.23 cm RV S prime:     13.60 cm/s TAPSE (M-mode): 2.3 cm LEFT ATRIUM             Index       RIGHT ATRIUM           Index LA diam:        4.80 cm 2.18 cm/m  RA Area:     21.10 cm LA Vol (A2C):   93.6 ml 42.46 ml/m RA Volume:   71.00 ml  32.20 ml/m LA Vol (A4C):   64.6 ml 29.30 ml/m LA Biplane Vol: 78.4 ml 35.56 ml/m  AORTIC VALVE LVOT Vmax:   76.05 cm/s LVOT Vmean:  56.650 cm/s LVOT VTI:  0.127 m  AORTA Ao Root diam: 4.10 cm Ao Asc diam:  4.10 cm MITRAL VALVE MV Area (PHT): 2.48 cm    SHUNTS MV Decel Time: 306 msec    Systemic VTI:  0.13 m MV E velocity: 64.70 cm/s  Systemic Diam: 2.40 cm MV A velocity: 92.50 cm/s MV E/A ratio:  0.70 Dwayne D Callwood MD Electronically signed by Yolonda Kida MD Signature Date/Time: 11/05/2020/8:04:18 AM    Final       Labs: BNP (last 3 results) No results for input(s): BNP in the last 8760 hours. Basic  Metabolic Panel: Recent Labs  Lab 11/18/20 1540 11/19/20 0458 11/20/20 0515  NA 137 138 139  K 4.1 3.5 3.8  CL 104 107 107  CO2 19* 24 26  GLUCOSE 110* 49* 151*  BUN 28* 19 14  CREATININE 1.80* 1.29* 1.27*  CALCIUM 9.7 8.8* 8.7*  MG  --   --  2.2   Liver Function Tests: No results for input(s): AST, ALT, ALKPHOS, BILITOT, PROT, ALBUMIN in the last 168 hours. No results for input(s): LIPASE, AMYLASE in the last 168 hours. No results for input(s): AMMONIA in the last 168 hours. CBC: Recent Labs  Lab 11/18/20 1540 11/19/20 0458 11/20/20 0515  WBC 6.9 6.7 4.8  HGB 13.9 12.5* 14.3  HCT 40.8 37.7* 42.1  MCV 90.1 92.6 93.1  PLT 180 148* 146*   Cardiac Enzymes: No results for input(s): CKTOTAL, CKMB, CKMBINDEX, TROPONINI in the last 168 hours. BNP: Invalid input(s): POCBNP CBG: Recent Labs  Lab 11/19/20 1618 11/19/20 2002 11/20/20 0048 11/20/20 0418 11/20/20 0737  GLUCAP 110* 219* 71 168* 85   D-Dimer No results for input(s): DDIMER in the last 72 hours. Hgb A1c No results for input(s): HGBA1C in the last 72 hours. Lipid Profile No results for input(s): CHOL, HDL, LDLCALC, TRIG, CHOLHDL, LDLDIRECT in the last 72 hours. Thyroid function studies Recent Labs    11/18/20 2131  TSH 0.326*   Anemia work up No results for input(s): VITAMINB12, FOLATE, FERRITIN, TIBC, IRON, RETICCTPCT in the last 72 hours. Urinalysis    Component Value Date/Time   COLORURINE YELLOW (A) 11/18/2020 1846   APPEARANCEUR CLEAR (A) 11/18/2020 1846   APPEARANCEUR Hazy (A) 08/27/2020 1503   LABSPEC 1.026 11/18/2020 1846   LABSPEC 1.013 01/01/2014 1619   PHURINE 5.0 11/18/2020 1846   GLUCOSEU NEGATIVE 11/18/2020 1846   GLUCOSEU >=500 01/01/2014 1619   HGBUR NEGATIVE 11/18/2020 1846   BILIRUBINUR NEGATIVE 11/18/2020 1846   BILIRUBINUR Negative 08/27/2020 1503   BILIRUBINUR Negative 01/01/2014 1619   KETONESUR 5 (A) 11/18/2020 1846   PROTEINUR NEGATIVE 11/18/2020 1846   UROBILINOGEN  0.2 07/05/2019 1020   NITRITE NEGATIVE 11/18/2020 1846   LEUKOCYTESUR NEGATIVE 11/18/2020 1846   LEUKOCYTESUR Negative 01/01/2014 1619   Sepsis Labs Invalid input(s): PROCALCITONIN,  WBC,  LACTICIDVEN Microbiology Recent Results (from the past 240 hour(s))  Blood culture (routine x 2)     Status: None (Preliminary result)   Collection Time: 11/18/20  5:26 PM   Specimen: BLOOD  Result Value Ref Range Status   Specimen Description BLOOD BLOOD LEFT HAND  Final   Special Requests   Final    BOTTLES DRAWN AEROBIC AND ANAEROBIC Blood Culture adequate volume   Culture   Final    NO GROWTH 2 DAYS Performed at [facility], [address] [place], [place] [address]    Report Status PENDING  Incomplete  Blood culture (routine x 2)  Status: None (Preliminary result)   Collection Time: 11/18/20  5:26 PM   Specimen: BLOOD  Result Value Ref Range Status   Specimen Description BLOOD BLOOD RIGHT HAND  Final   Special Requests   Final    BOTTLES DRAWN AEROBIC AND ANAEROBIC Blood Culture adequate volume   Culture   Final    NO GROWTH 2 DAYS Performed at [facility], [place], [place] [address]    Report Status PENDING  Incomplete  SARS CORONAVIRUS 2 (TAT 6-24 HRS) Nasopharyngeal Nasopharyngeal Swab     Status: None   Collection Time: 11/18/20  7:32 PM   Specimen: Nasopharyngeal Swab  Result Value Ref Range Status   SARS Coronavirus 2 NEGATIVE NEGATIVE Final    Comment: (NOTE) SARS-CoV-2 target nucleic acids are NOT DETECTED.  The SARS-CoV-2 RNA is generally detectable in upper and lower respiratory specimens during the acute phase of infection. Negative results do not preclude SARS-CoV-2 infection, do not rule out co-infections with other pathogens, and should not be used as the sole basis for treatment or other patient management decisions. Negative results must be combined with clinical observations, patient history, and epidemiological  information. The expected result is Negative.  Fact Sheet for Patients: SugarRoll.be  Fact Sheet for Healthcare Providers: https://www.woods-mathews.com/  This test is not yet approved or cleared by the [place] FDA and  has been authorized for detection and/or diagnosis of SARS-CoV-2 by FDA under an Emergency Use Authorization (EUA). This EUA will remain  in effect (meaning this test can be used) for the duration of the COVID-19 declaration under Se ction 564(b)(1) of the Act, 21 U.S.C. section 360bbb-3(b)(1), unless the authorization is terminated or revoked sooner.  Performed at [facility], [place] [address], [place], [place] [address]      Total time spend on discharging this patient, including the last patient exam, discussing the hospital stay, instructions for ongoing care as it relates to all pertinent caregivers, as well as preparing the medical discharge records, prescriptions, and/or referrals as applicable, is 45 minutes.    Enzo Bi, MD  [facility] 11/20/2020, 10:13 AM

## 2020-11-23 LAB — CULTURE, BLOOD (ROUTINE X 2)
Culture: NO GROWTH
Culture: NO GROWTH
Special Requests: ADEQUATE
Special Requests: ADEQUATE

## 2020-11-26 NOTE — Progress Notes (Deleted)
Established patient visit   Patient: Tom Moore   DOB: 1946-05-13   75 y.o. Male  MRN: [id number] Visit Date: 11/27/2020  Today's healthcare provider: [name], MD   No chief complaint on file.  Subjective    HPI  Follow up Hospitalization  Patient was admitted to [facility] on 11/04/2020 and discharged on 11/06/2020. He was treated for Syncope. Treatment for this included; see notes in chart. Telephone follow up was done on none He reports {excellent/good/fair:19992} compliance with treatment. He reports this condition is {resolved/improved/worsened:23923}.  ----------------------------------------------------------------------------------------- -    {Show patient history (optional):23778::" "}   Medications: Outpatient Medications Prior to Visit  Medication Sig  . atorvastatin (LIPITOR) 40 MG tablet Take 40 mg by mouth daily.  Marland Kitchen buPROPion (WELLBUTRIN SR) 150 MG 12 hr tablet Take 1 tablet (150 mg total) by mouth 2 (two) times daily.  Marland Kitchen diltiazem (CARDIZEM CD) 120 MG 24 hr capsule Hold until followup with your outpatient doctor due to low blood pressure.  . dutasteride (AVODART) 0.5 MG capsule Take 1 capsule by mouth once daily  . furosemide (LASIX) 40 MG tablet Hold until outpatient followup with your doctor due to low blood pressure.  Marland Kitchen glucose blood test strip Use 3 (three) times daily One touch ultra test strips e11.29  . insulin NPH-regular Human (70-30) 100 UNIT/ML injection Inject 20 Units into the skin 2 (two) times daily with a meal.  . meclizine (ANTIVERT) 25 MG tablet TAKE 1 TABLET BY MOUTH THREE TIMES DAILY AS NEEDED FOR  DIZZINESS (Patient not taking: No sig reported)  . metFORMIN (GLUCOPHAGE) 1000 MG tablet TAKE 1 TABLET BY MOUTH TWICE DAILY WITH A MEAL (Patient taking differently: daily with breakfast.)  . methimazole (TAPAZOLE) 5 MG tablet Take 5 mg by mouth.  . metoprolol tartrate (LOPRESSOR) 25 MG tablet Take 25 mg by mouth 2 (two) times  daily.  Marland Kitchen omeprazole (PRILOSEC) 40 MG capsule Take 1 capsule by mouth once daily  . PACERONE 200 MG tablet Take by mouth.  . sacubitril-valsartan (ENTRESTO) 24-26 MG Take by mouth.  . sertraline (ZOLOFT) 100 MG tablet Take 1 tablet by mouth once daily (Patient taking differently: Take 100 mg by mouth daily.)  . sucralfate (CARAFATE) 1 g tablet TAKE 1 TABLET BY MOUTH THREE TIMES DAILY WITH MEALS  . tamsulosin (FLOMAX) 0.4 MG CAPS capsule TAKE 2 CAPSULES BY MOUTH ONCE DAILY WITH LUNCH (Patient taking differently: Take 0.8 mg by mouth daily with lunch.)  . venlafaxine XR (EFFEXOR-XR) 150 MG 24 hr capsule TAKE 1 CAPSULE BY MOUTH ONCE DAILY WITH BREAKFAST (Patient taking differently: Take 150 mg by mouth daily with breakfast.)   No facility-administered medications prior to visit.    Review of Systems  Constitutional: Negative for appetite change, chills and fever.  Respiratory: Negative for chest tightness, shortness of breath and wheezing.   Cardiovascular: Negative for chest pain and palpitations.  Gastrointestinal: Negative for abdominal pain, nausea and vomiting.    {Labs  Heme  Chem  Endocrine  Serology  Results Review (optional):23779::" "}   Objective    There were no vitals taken for this visit. {Show previous vital signs (optional):23777::" "}   Physical Exam  ***  No results found for any visits on 11/27/20.  Assessment & Plan     ***  No follow-ups on file.      {provider attestation***:1}   [name], MD  [facility] [phone] (phone) [phone] (fax)  [facility]

## 2020-11-27 ENCOUNTER — Inpatient Hospital Stay: Payer: Medicare Other | Admitting: Family Medicine

## 2020-12-03 ENCOUNTER — Encounter: Payer: Self-pay | Admitting: Family Medicine

## 2020-12-03 ENCOUNTER — Other Ambulatory Visit: Payer: Self-pay

## 2020-12-03 ENCOUNTER — Ambulatory Visit (INDEPENDENT_AMBULATORY_CARE_PROVIDER_SITE_OTHER): Payer: Medicare Other | Admitting: Family Medicine

## 2020-12-03 VITALS — BP 116/70 | HR 97 | Temp 98.4°F | Resp 20 | Ht 69.0 in | Wt 224.0 lb

## 2020-12-03 DIAGNOSIS — E162 Hypoglycemia, unspecified: Secondary | ICD-10-CM

## 2020-12-03 DIAGNOSIS — I5020 Unspecified systolic (congestive) heart failure: Secondary | ICD-10-CM

## 2020-12-03 DIAGNOSIS — R42 Dizziness and giddiness: Secondary | ICD-10-CM

## 2020-12-03 DIAGNOSIS — W19XXXA Unspecified fall, initial encounter: Secondary | ICD-10-CM

## 2020-12-03 DIAGNOSIS — I959 Hypotension, unspecified: Secondary | ICD-10-CM | POA: Diagnosis not present

## 2020-12-03 DIAGNOSIS — R251 Tremor, unspecified: Secondary | ICD-10-CM

## 2020-12-03 DIAGNOSIS — R2689 Other abnormalities of gait and mobility: Secondary | ICD-10-CM

## 2020-12-03 NOTE — Progress Notes (Signed)
Established patient visit   Patient: Tom Moore   DOB: 23-Aug-1946   75 y.o. Male  MRN: [id number] Visit Date: 12/03/2020  Today's healthcare provider: [name], MD   Chief Complaint  Patient presents with  . Hospitalization Follow-up   Subjective    HPI  Patient comes in today for follow-up of hospitalization for syncope secondary to hypoglycemia and hypotension. Hospital records reviewed. Patient is accompanied by his sister. Follow up hospital visit  Patient was seen in ER for syncope and collapse on 11/18/2020. Treatment for this included discontinuing diltiazem, glipizide, and ozempic. He reports good compliance with treatment. He reports this condition is Unchanged. Patient reports that he is still feeling weak and off balance. He reports that his FBS averages around 80s-90s.       Medications: Outpatient Medications Prior to Visit  Medication Sig  . atorvastatin (LIPITOR) 40 MG tablet Take 40 mg by mouth daily.  Marland Kitchen buPROPion (WELLBUTRIN SR) 150 MG 12 hr tablet Take 1 tablet (150 mg total) by mouth 2 (two) times daily.  Marland Kitchen diltiazem (CARDIZEM CD) 120 MG 24 hr capsule Hold until followup with your outpatient doctor due to low blood pressure. (Patient not taking: Reported on 12/03/2020)  . dutasteride (AVODART) 0.5 MG capsule Take 1 capsule by mouth once daily  . furosemide (LASIX) 40 MG tablet Hold until outpatient followup with your doctor due to low blood pressure. (Patient not taking: Reported on 12/03/2020)  . glucose blood test strip Use 3 (three) times daily One touch ultra test strips e11.29  . insulin NPH-regular Human (70-30) 100 UNIT/ML injection Inject 20 Units into the skin 2 (two) times daily with a meal.  . meclizine (ANTIVERT) 25 MG tablet TAKE 1 TABLET BY MOUTH THREE TIMES DAILY AS NEEDED FOR  DIZZINESS (Patient not taking: No sig reported)  . metFORMIN (GLUCOPHAGE) 1000 MG tablet TAKE 1 TABLET BY MOUTH TWICE DAILY WITH A MEAL (Patient taking  differently: daily with breakfast.)  . methimazole (TAPAZOLE) 5 MG tablet Take 5 mg by mouth.  . metoprolol tartrate (LOPRESSOR) 25 MG tablet Take 25 mg by mouth 2 (two) times daily.  Marland Kitchen omeprazole (PRILOSEC) 40 MG capsule Take 1 capsule by mouth once daily  . PACERONE 200 MG tablet Take by mouth.  . sacubitril-valsartan (ENTRESTO) 24-26 MG Take by mouth.  . sertraline (ZOLOFT) 100 MG tablet Take 1 tablet by mouth once daily (Patient taking differently: Take 100 mg by mouth daily.)  . sucralfate (CARAFATE) 1 g tablet TAKE 1 TABLET BY MOUTH THREE TIMES DAILY WITH MEALS  . tamsulosin (FLOMAX) 0.4 MG CAPS capsule TAKE 2 CAPSULES BY MOUTH ONCE DAILY WITH LUNCH (Patient taking differently: Take 0.8 mg by mouth daily with lunch.)  . venlafaxine XR (EFFEXOR-XR) 150 MG 24 hr capsule TAKE 1 CAPSULE BY MOUTH ONCE DAILY WITH BREAKFAST (Patient taking differently: Take 150 mg by mouth daily with breakfast.)   No facility-administered medications prior to visit.    Review of Systems  Constitutional: Negative for activity change, fatigue and unexpected weight change.  Cardiovascular: Negative for chest pain, palpitations and leg swelling.  Musculoskeletal: Positive for gait problem. Negative for arthralgias.  Neurological: Positive for weakness and light-headedness. Negative for dizziness, syncope and headaches.  Psychiatric/Behavioral: Negative for agitation.        Objective    BP 116/70   Pulse 97   Temp 98.4 F (36.9 C)   Resp 20   Ht 5\' 9"  (1.753 m)  Wt 224 lb (101.6 kg)   BMI 33.08 kg/m  BP Readings from Last 3 Encounters:  12/03/20 116/70  11/20/20 (!) 152/99  11/06/20 103/73   Wt Readings from Last 3 Encounters:  12/03/20 224 lb (101.6 kg)  11/18/20 229 lb 1.6 oz (103.9 kg)  11/06/20 226 lb 13.7 oz (102.9 kg)       Physical Exam Vitals and nursing note reviewed.  Constitutional:      Appearance: Normal appearance. He is normal weight.  Eyes:     General: No scleral  icterus.    Conjunctiva/sclera: Conjunctivae normal.  Cardiovascular:     Rate and Rhythm: Normal rate and regular rhythm.     Pulses: Normal pulses.     Heart sounds: Normal heart sounds.  Pulmonary:     Effort: Pulmonary effort is normal.     Breath sounds: Normal breath sounds.  Abdominal:     General: There is no distension.     Palpations: Abdomen is soft.     Tenderness: There is no abdominal tenderness.  Musculoskeletal:     Cervical back: Normal range of motion and neck supple.  Skin:    General: Skin is warm and dry.  Neurological:     General: No focal deficit present.     Mental Status: He is alert and oriented to person, place, and time.     Gait: Gait normal.     Comments: Patient walking with a cane.  Psychiatric:        Mood and Affect: Mood normal.        Behavior: Behavior normal.        Thought Content: Thought content normal.        Judgment: Judgment normal.       No results found for any visits on 12/03/20.  Assessment & Plan     1. Hypoglycemia Do not restart sulfonylureas and stay off of Ozempic for now.  Follow-up A1c when appropriate.  Check blood sugars daily  2. Fall, initial encounter Refer to physical therapy due to fall risk.  Walks with a cane.  May need a neurological referral - Ambulatory referral to Physical Therapy  3. Heart failure with reduced ejection fraction ([place]) Followed by cardiology.  EF at hospitalization was 60%. - Ambulatory referral to Cardiology  4. Dizziness Improved. - Ambulatory referral to Cardiology  5. Hypotension, unspecified hypotension type ~Stay off diltiazem and Lasix.  Stop Entresto and tamsulosin.  Has follow-up with cardiology and will follow up here in 1 month. - Ambulatory referral to Cardiology  6. Occasional tremors For to neurology due to tremor and weakness. - Ambulatory referral to Neurology  7. Balance problem As well therapy referral - Ambulatory referral to Physical Therapy   No  follow-ups on file.      I, [name], MD, have reviewed all documentation for this visit. The documentation on 12/06/20 for the exam, diagnosis, procedures, and orders are all accurate and complete.    Giancarlos Berendt Cranford Mon, MD  [facility] [phone] (phone) [phone] (fax)  [place]

## 2020-12-03 NOTE — Patient Instructions (Addendum)
Stop Entresto and Tamsulosin.

## 2020-12-08 ENCOUNTER — Telehealth: Payer: Self-pay

## 2020-12-08 NOTE — Telephone Encounter (Signed)
Carlean Jews was advised.

## 2020-12-08 NOTE — Telephone Encounter (Signed)
Please advise 

## 2020-12-08 NOTE — Telephone Encounter (Signed)
I would not add something for sleep as he is already unsteady on his feet.  They should be hearing this week from physical therapy in the next week or 2 from neurology.

## 2020-12-08 NOTE — Telephone Encounter (Signed)
Copied from [place] [id number]. Topic: General - Inquiry >> Dec 08, 2020  9:46 AM Alanda Slim E wrote: Reason for CRM: Pt forgot to mention that he was having trouble sleeping and his sister called to see if something can be called in to help with this. Please advise / She also wanted to check the status of a referral for Neurology and Physical therapy/ please advise

## 2020-12-08 NOTE — Telephone Encounter (Signed)
LMOVM for Vivana to return call.

## 2020-12-16 ENCOUNTER — Ambulatory Visit: Payer: Self-pay | Admitting: *Deleted

## 2020-12-16 ENCOUNTER — Telehealth: Payer: Self-pay

## 2020-12-16 NOTE — Telephone Encounter (Signed)
Per initial encounter, "Pts daughter was concerned for her father because she states he is going down fast and believes he needs to see a Neurologist ASAP. Referral request has already been sent to Dr Rosanna Randy. Caller stated her father had a fall over the weekend and is very unsteady and wanted some advice";  contacted pt's daughter Tom Moore to discuss his symptoms; she says over the past 2 weeks his body shakes when he stands up; pt fell on 12/13/20 and 12/14/20; the pt is not able to walk without assistance; he almost fell on 12/15/20 but his grandson was standing behind him and caught him; the pt's daughter says his entire body shakes when he sleeps; this is worse than initially reported; she says the pt's BP and blood sugars are ok;the pt's daughter says he was not injured from the falls this past weekend but he has ongoing right shoulder pain since fall on 11/18/20; the pt was hospitalized 11/18/20-11/20/20; the pt is seen by Dr Miguel Aschoff, [facility]; discussed with Jiles Garter and she states the referral for Neuro has been sent to the [facility]; left message on voicemail of referral coordinator Judson Roch [phone]) to contact pt's daughter; pt's daughter notified and advised to take pt to ED for evaluation due to his worsening symptoms; will route to office for notification of encounter.  Reason for Disposition . General information question, no triage required and triager able to answer question  Answer Assessment - Initial Assessment Questions 1. REASON FOR CALL or QUESTION: "What is your reason for calling today?" or "How can I best help you?" or "What question do you have that I can help answer?"     Neuro referral  Protocols used: INFORMATION ONLY CALL - NO TRIAGE-A-AH

## 2020-12-16 NOTE — Telephone Encounter (Signed)
Please advise 

## 2020-12-16 NOTE — Telephone Encounter (Signed)
See previous nurse triage note dated 12/16/20.

## 2020-12-16 NOTE — Telephone Encounter (Signed)
Copied from [id number] [phone] [phone]. Topic: Referral - Question >> Dec 16, 2020  1:51 PM Pawlus, Brayton Layman A wrote: Reason for CRM: Pts daughter Izora Gala wanted to know if Dr Rosanna Randy could place a referral for the Pt to see a neurologist.

## 2020-12-17 NOTE — Telephone Encounter (Signed)
Tom Moore of Neurology appt. They were already aware. They were wanting an appt sooner. Advised them to contact Dr. Trena Platt office.

## 2020-12-17 NOTE — Telephone Encounter (Signed)
Yes but I think it has already been done,

## 2020-12-26 ENCOUNTER — Emergency Department: Payer: Medicare Other

## 2020-12-26 ENCOUNTER — Encounter: Payer: Self-pay | Admitting: Emergency Medicine

## 2020-12-26 ENCOUNTER — Observation Stay
Admission: EM | Admit: 2020-12-26 | Discharge: 2020-12-27 | Disposition: A | Discharge status: Home / Self Care | Payer: Medicare Other | Attending: Hospitalist | Admitting: Hospitalist

## 2020-12-26 ENCOUNTER — Inpatient Hospital Stay: Payer: Medicare Other

## 2020-12-26 ENCOUNTER — Other Ambulatory Visit: Payer: Self-pay

## 2020-12-26 DIAGNOSIS — R251 Tremor, unspecified: Secondary | ICD-10-CM

## 2020-12-26 DIAGNOSIS — Z794 Long term (current) use of insulin: Secondary | ICD-10-CM | POA: Diagnosis not present

## 2020-12-26 DIAGNOSIS — E1122 Type 2 diabetes mellitus with diabetic chronic kidney disease: Secondary | ICD-10-CM

## 2020-12-26 DIAGNOSIS — M4802 Spinal stenosis, cervical region: Secondary | ICD-10-CM

## 2020-12-26 DIAGNOSIS — I5020 Unspecified systolic (congestive) heart failure: Secondary | ICD-10-CM | POA: Diagnosis present

## 2020-12-26 DIAGNOSIS — I10 Essential (primary) hypertension: Secondary | ICD-10-CM

## 2020-12-26 DIAGNOSIS — R296 Repeated falls: Principal | ICD-10-CM

## 2020-12-26 DIAGNOSIS — R55 Syncope and collapse: Secondary | ICD-10-CM | POA: Diagnosis not present

## 2020-12-26 DIAGNOSIS — I130 Hypertensive heart and chronic kidney disease with heart failure and stage 1 through stage 4 chronic kidney disease, or unspecified chronic kidney disease: Secondary | ICD-10-CM | POA: Insufficient documentation

## 2020-12-26 DIAGNOSIS — Z7984 Long term (current) use of oral hypoglycemic drugs: Secondary | ICD-10-CM | POA: Diagnosis not present

## 2020-12-26 DIAGNOSIS — G629 Polyneuropathy, unspecified: Secondary | ICD-10-CM | POA: Diagnosis not present

## 2020-12-26 DIAGNOSIS — R2681 Unsteadiness on feet: Secondary | ICD-10-CM

## 2020-12-26 DIAGNOSIS — I480 Paroxysmal atrial fibrillation: Secondary | ICD-10-CM | POA: Diagnosis not present

## 2020-12-26 DIAGNOSIS — F324 Major depressive disorder, single episode, in partial remission: Secondary | ICD-10-CM

## 2020-12-26 DIAGNOSIS — Z79899 Other long term (current) drug therapy: Secondary | ICD-10-CM | POA: Insufficient documentation

## 2020-12-26 DIAGNOSIS — Z20822 Contact with and (suspected) exposure to covid-19: Secondary | ICD-10-CM | POA: Diagnosis not present

## 2020-12-26 DIAGNOSIS — K219 Gastro-esophageal reflux disease without esophagitis: Secondary | ICD-10-CM

## 2020-12-26 DIAGNOSIS — E782 Mixed hyperlipidemia: Secondary | ICD-10-CM

## 2020-12-26 DIAGNOSIS — Z87891 Personal history of nicotine dependence: Secondary | ICD-10-CM | POA: Diagnosis not present

## 2020-12-26 DIAGNOSIS — R531 Weakness: Secondary | ICD-10-CM | POA: Diagnosis present

## 2020-12-26 DIAGNOSIS — N183 Chronic kidney disease, stage 3 unspecified: Secondary | ICD-10-CM

## 2020-12-26 DIAGNOSIS — I5022 Chronic systolic (congestive) heart failure: Secondary | ICD-10-CM | POA: Diagnosis not present

## 2020-12-26 DIAGNOSIS — N1832 Chronic kidney disease, stage 3b: Secondary | ICD-10-CM | POA: Diagnosis not present

## 2020-12-26 DIAGNOSIS — E0590 Thyrotoxicosis, unspecified without thyrotoxic crisis or storm: Secondary | ICD-10-CM

## 2020-12-26 DIAGNOSIS — E119 Type 2 diabetes mellitus without complications: Secondary | ICD-10-CM

## 2020-12-26 DIAGNOSIS — F32A Depression, unspecified: Secondary | ICD-10-CM | POA: Diagnosis present

## 2020-12-26 LAB — COMPREHENSIVE METABOLIC PANEL WITH GFR
ALT: 133 U/L — ABNORMAL HIGH (ref 0–44)
AST: 83 U/L — ABNORMAL HIGH (ref 15–41)
Albumin: 4.1 g/dL (ref 3.5–5.0)
Alkaline Phosphatase: 95 U/L (ref 38–126)
Anion gap: 11 (ref 5–15)
BUN: 22 mg/dL (ref 8–23)
CO2: 26 mmol/L (ref 22–32)
Calcium: 9.3 mg/dL (ref 8.9–10.3)
Chloride: 105 mmol/L (ref 98–111)
Creatinine, Ser: 1.73 mg/dL — ABNORMAL HIGH (ref 0.61–1.24)
GFR, Estimated: 41 mL/min — ABNORMAL LOW
Glucose, Bld: 125 mg/dL — ABNORMAL HIGH (ref 70–99)
Potassium: 3.8 mmol/L (ref 3.5–5.1)
Sodium: 142 mmol/L (ref 135–145)
Total Bilirubin: 1.1 mg/dL (ref 0.3–1.2)
Total Protein: 7.4 g/dL (ref 6.5–8.1)

## 2020-12-26 LAB — CBC WITH DIFFERENTIAL/PLATELET
Abs Immature Granulocytes: 0.03 10*3/uL (ref 0.00–0.07)
Basophils Absolute: 0 10*3/uL (ref 0.0–0.1)
Basophils Relative: 1 %
Eosinophils Absolute: 0.2 10*3/uL (ref 0.0–0.5)
Eosinophils Relative: 3 %
HCT: 43.4 % (ref 39.0–52.0)
Hemoglobin: 14.7 g/dL (ref 13.0–17.0)
Immature Granulocytes: 1 %
Lymphocytes Relative: 23 %
Lymphs Abs: 1.3 10*3/uL (ref 0.7–4.0)
MCH: 30.6 pg (ref 26.0–34.0)
MCHC: 33.9 g/dL (ref 30.0–36.0)
MCV: 90.2 fL (ref 80.0–100.0)
Monocytes Absolute: 0.5 10*3/uL (ref 0.1–1.0)
Monocytes Relative: 9 %
Neutro Abs: 3.7 10*3/uL (ref 1.7–7.7)
Neutrophils Relative %: 63 %
Platelets: 180 10*3/uL (ref 150–400)
RBC: 4.81 MIL/uL (ref 4.22–5.81)
RDW: 13.8 % (ref 11.5–15.5)
WBC: 5.7 10*3/uL (ref 4.0–10.5)
nRBC: 0 % (ref 0.0–0.2)

## 2020-12-26 LAB — TROPONIN I (HIGH SENSITIVITY)
Troponin I (High Sensitivity): 21 ng/L — ABNORMAL HIGH (ref <18)
Troponin I (High Sensitivity): 16 ng/L (ref <18)

## 2020-12-26 LAB — RESP PANEL BY RT-PCR (FLU A&B, COVID) ARPGX2
Influenza A by PCR: NEGATIVE
Influenza B by PCR: NEGATIVE
SARS Coronavirus 2 by RT PCR: NEGATIVE

## 2020-12-26 LAB — HEPATITIS PANEL, ACUTE
HCV Ab: NONREACTIVE
Hep A IgM: NONREACTIVE
Hep B C IgM: NONREACTIVE
Hepatitis B Surface Ag: NONREACTIVE

## 2020-12-26 LAB — VITAMIN B12: Vitamin B-12: 412 pg/mL (ref 180–914)

## 2020-12-26 LAB — FOLATE: Folate: 28 ng/mL (ref >5.9)

## 2020-12-26 LAB — TSH: TSH: 1.529 u[IU]/mL (ref 0.350–4.500)

## 2020-12-26 LAB — GLUCOSE, CAPILLARY
Glucose-Capillary: 112 mg/dL — ABNORMAL HIGH (ref 70–99)
Glucose-Capillary: 107 mg/dL — ABNORMAL HIGH (ref 70–99)

## 2020-12-26 LAB — BRAIN NATRIURETIC PEPTIDE: B Natriuretic Peptide: 229.9 pg/mL — ABNORMAL HIGH (ref 0.0–100.0)

## 2020-12-26 LAB — MAGNESIUM: Magnesium: 1.7 mg/dL (ref 1.7–2.4)

## 2020-12-26 IMAGING — MR MR THORACIC SPINE WO/W CM
7 of 9 series · 30 of 48 positions shown · IV contrast (10ml Gadavist)
Comparison: None.

CLINICAL DATA: Imbalance.  Frequent falls.

EXAM:
MRI CERVICAL, THORACIC AND LUMBAR SPINE WITHOUT CONTRAST
TECHNIQUE: Multiplanar and multiecho pulse sequences of the cervical spine, to
include the craniocervical junction and cervicothoracic junction,
and thoracic and lumbar spine, were obtained without intravenous
contrast.

[Series 18: T1 · sagittal · 6.0mm · 1.88mm/px · 3 of 9 slices shown (1 of 2)]
[im 1/9]
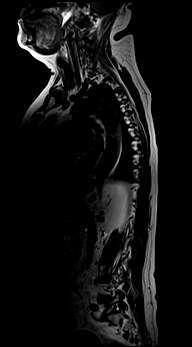
[im 5/9]
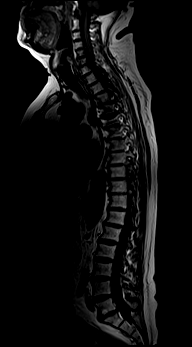
[im 9/9]
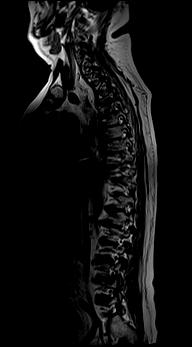

[Series 19: T2 · sagittal · 3.0mm · 1.33mm/px · 3 of 17 slices shown (1 of 2)]
[im 1/17]
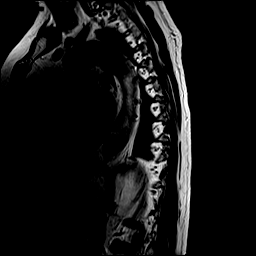
[im 9/17]
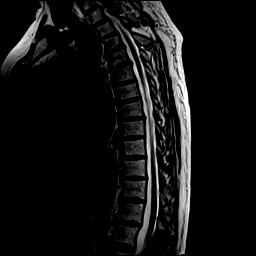
[im 17/17]
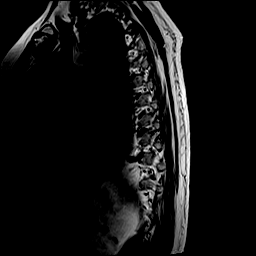

[Series 20: T1 · sagittal · 3.0mm · 1.33mm/px · 3 of 17 slices shown (2 of 2)]
[im 1/17]
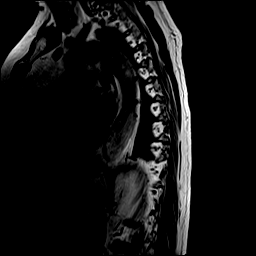
[im 9/17]
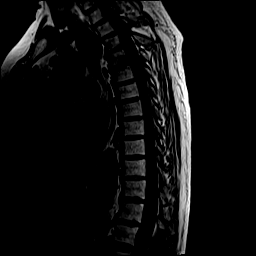
[im 17/17]
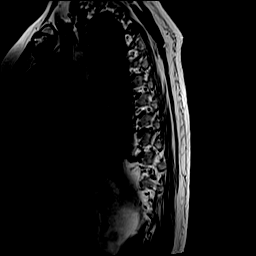

[Series 21: STIR · sagittal · 3.0mm · 0.66mm/px · 3 of 17 slices shown]
[im 1/17]
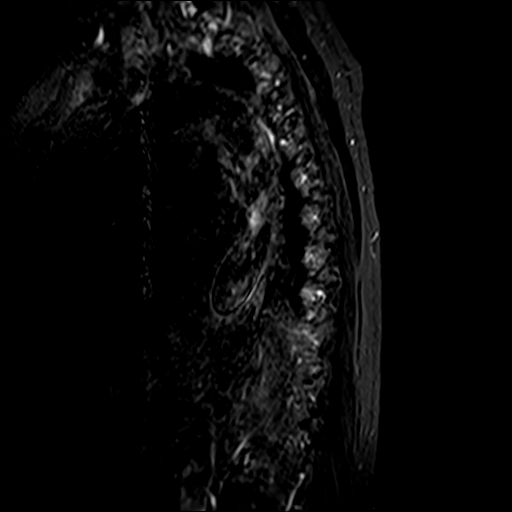
[im 9/17]
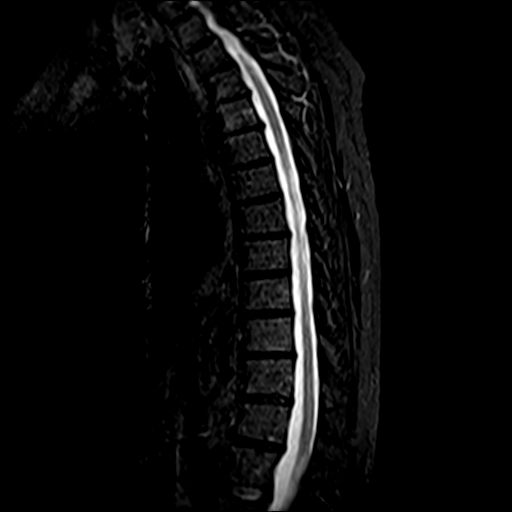
[im 17/17]
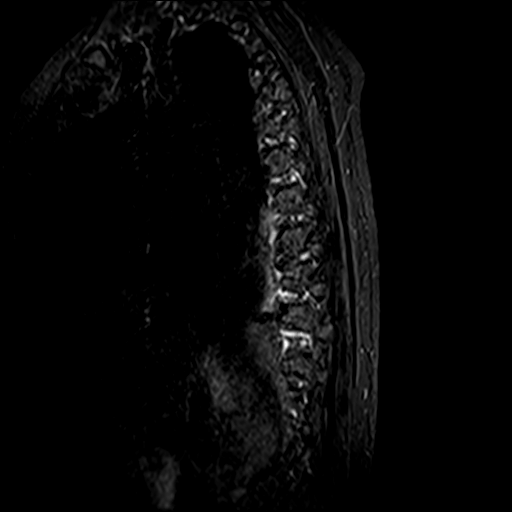

[Series 22: T2 · axial · 4.0mm · 0.59mm/px · z∈[-374,-127]mm · 8 of 39 slices shown (2 of 2)]
[im 1/39]
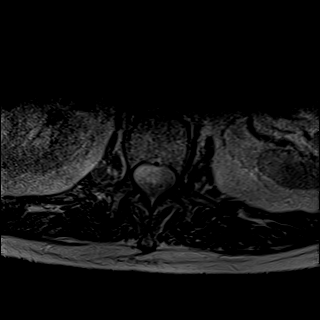
[im 6/39]
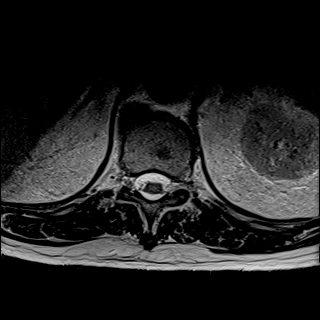
[im 11/39]
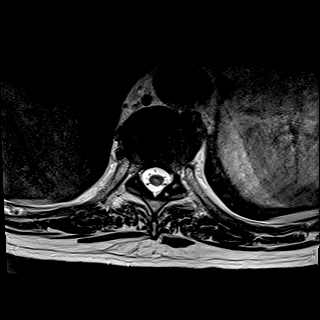
[im 17/39]
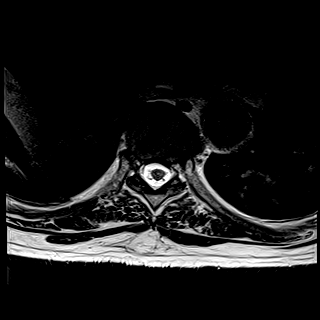
[im 22/39]
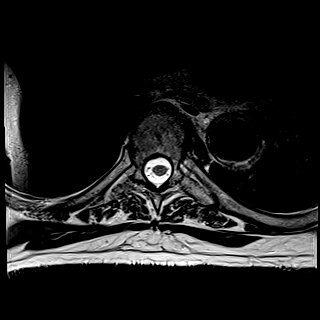
[im 28/39]
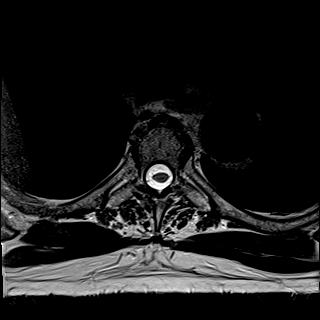
[im 33/39]
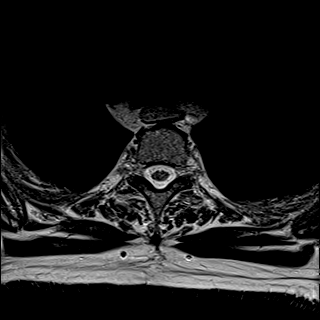
[im 39/39]
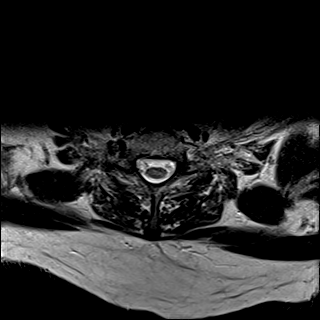

[Series 24: T1 post-contrast · axial · non-contrast · 4.0mm · 0.37mm/px · z∈[-374,-178]mm · 6 of 39 slices shown]
[im 1/39]
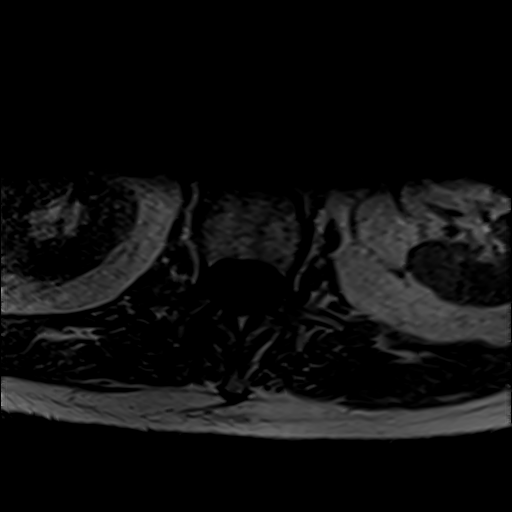
[im 6/39]
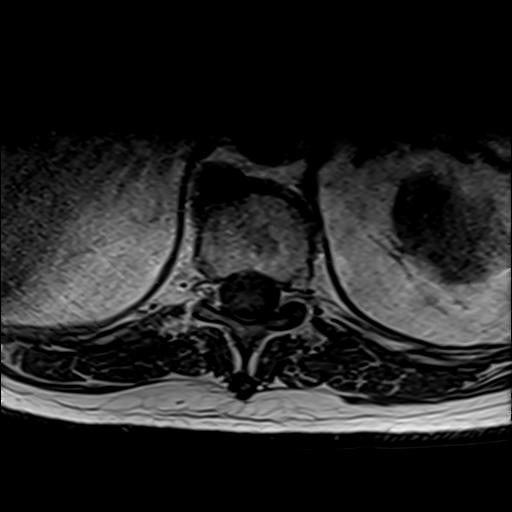
[im 11/39]
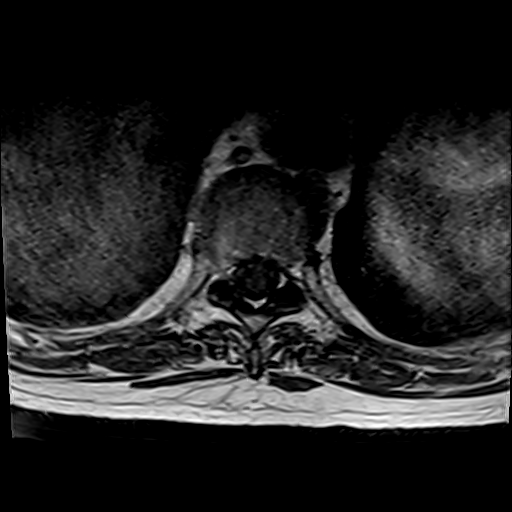
[im 17/39]
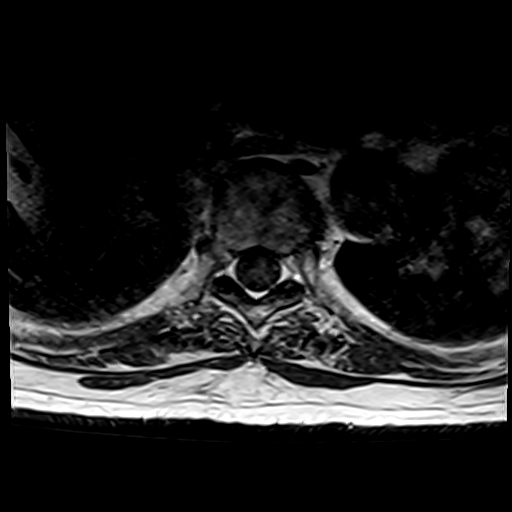
[im 22/39]
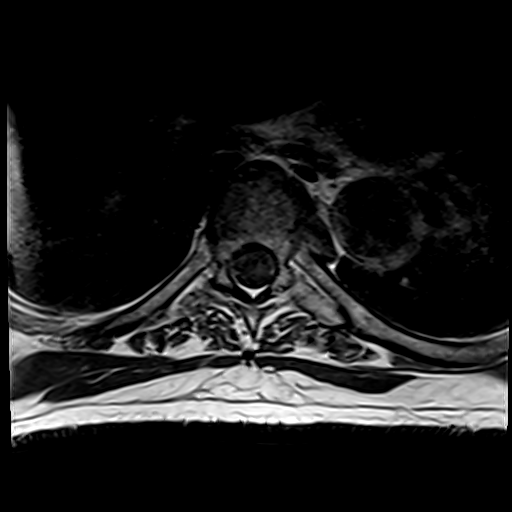
[im 28/39]
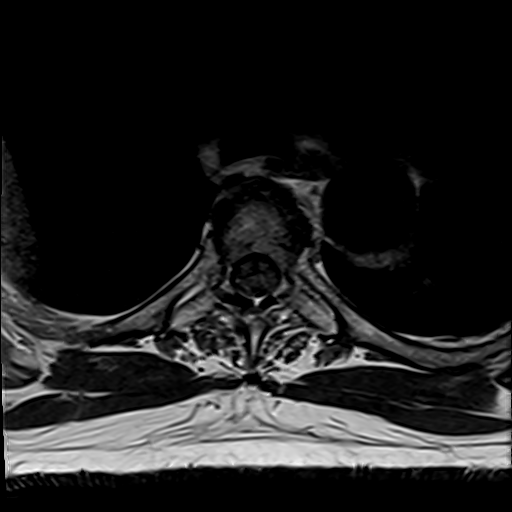

[Series 40: T1 fat-sat post-contrast · sagittal · 3.0mm · 1.33mm/px · 4 of 19 slices shown]
[im 1/19]
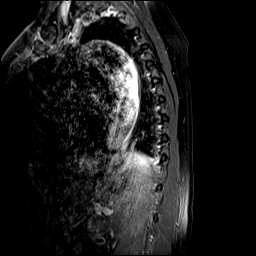
[im 7/19]
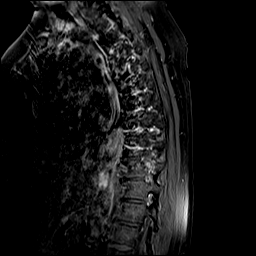
[im 13/19]
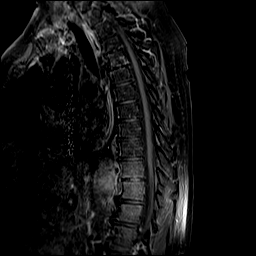
[im 19/19]
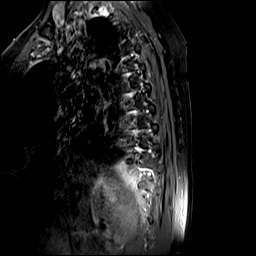

[30 of 48 positions shown; findings below may reference images not displayed]

FINDINGS: MRI CERVICAL SPINE FINDINGS

Some sequences (including STIR) are degraded by motion. Within this
limitation:

Alignment: Approximately 2 mm of anterolisthesis of C7 on T1.
Otherwise, no substantial sagittal subluxation. Mild straightening.

Vertebrae: Vertebral body heights are maintained. No specific
evidence of acute fracture, discitis/osteomyelitis, or suspicious
bone lesion. No abnormal enhancement. Degenerative/discogenic
endplate signal changes at C5-C6 and C6-C7.

Cord: Normal cord signal.  No cord enhancement.

Posterior Fossa, vertebral arteries, paraspinal tissues: Mild edema
and enhancement in the subcutaneous fat of the posterior/lower neck
overlying the T1 spinous process (series 8, image 8).

Disc levels:

C2-C3: Bilateral facet and uncovertebral hypertrophy without
significant canal or foraminal stenosis.

C3-C4: Small posterior disc osteophyte complex and left greater than
right facet and uncovertebral hypertrophy. Moderate left and mild
right foraminal stenosis. Posterior disc contacts and flattens the
left eccentric ventral cord without significant canal stenosis.

C4-C5: Posterior disc osteophyte complex and left greater than right
facet and uncovertebral hypertrophy. Resulting severe left and mild
right foraminal stenosis. Moderate canal stenosis.

C5-C6: Posterior disc osteophyte complex and left greater than right
facet and uncovertebral hypertrophy. Resulting severe left foraminal
stenosis and mild right foraminal stenosis. Mild canal stenosis.

C6-C7: Posterior disc osteophyte complex and left greater than right
facet and uncovertebral hypertrophy. Resulting severe left foraminal
stenosis and mild right foraminal stenosis. Mild canal stenosis.

C7-T1: No significant disc protrusion, foraminal stenosis, or canal
stenosis.

MRI THORACIC SPINE FINDINGS

Alignment:  No substantial sagittal subluxation.

Vertebrae: Degenerative/discogenic endplate signal changes about the
T4-T5 disc. Vertebral body heights are maintained. No specific
evidence of acute fracture, discitis/osteomyelitis, or suspicious
bone lesion.

Cord: Normal cord signal.  No abnormal enhancement.

Paraspinal and other soft tissues: Unremarkable

Disc levels:

Multiple small disc bulges without significant canal stenosis.
Multilevel facet hypertrophy. Mild to moderate bilateral foraminal
stenosis at T1-T2. Otherwise, multilevel mild foraminal stenosis in
the thoracic spine.

MRI LUMBAR SPINE FINDINGS

Segmentation:  Standard

Alignment:  Slight (2 mm) retrolisthesis of L5 on S1.

Vertebrae: Degenerative/discogenic endplate signal changes about the
L5-S1 disc. Vertebral body heights are maintained. No specific
evidence of acute fracture, discitis/osteomyelitis, or suspicious
bone lesion. No abnormal enhancement.

Conus medullaris and cauda equina: Conus extends to the L1 level.
Conus appears normal. No abnormal enhancement.

Paraspinal and other soft tissues: Unremarkable.

Disc levels:

T12-L1: Left subarticular/foraminal disc protrusion and mild
bilateral facet hypertrophy without significant canal or foraminal
stenosis.

L1-L2: Mild disc bulging and mild bilateral facet hypertrophy
without significant canal or foraminal stenosis.

L2-L3: Mild disc bulging and mild bilateral facet hypertrophy
without significant canal or foraminal stenosis.

L3-L4: Right eccentric disc bulge with superimposed inferiorly
dissecting central disc protrusion and right foraminal disc
protrusion. Mild bilateral facet hypertrophy. Resulting moderate
right foraminal stenosis. Mild canal stenosis and mild right greater
than left subarticular recess stenosis.

L4-L5: Broad disc bulge with small superimposed left subarticular
disc protrusion. Mild bilateral foraminal stenosis without
significant canal stenosis.

L5-S1: Disc desiccation and height loss. Approximately 2 mm of
retrolisthesis of L5 on S1. Central disc protrusion and small right
foraminal disc protrusion. Mild right foraminal stenosis without
significant canal stenosis.
IMPRESSION: Cervical spine:

1. Severe left and mild right foraminal stenosis at C4-C5, C5-C6 and
C6-C7. Moderate left and mild right foraminal stenosis C3-C4.
2. Moderate canal stenosis at C4-C5 and mild canal stenosis at C5-C6
and C6-C7.
3. Mild edema and enhancement in the subcutaneous fat of the
posterior/lower neck overlying the T1 spinous process, which could
be inflammatory, infectious or vascular.

Thoracic spine:

1. Mild to moderate bilateral foraminal stenosis at T1-T2.
Otherwise, multilevel mild foraminal stenosis.
2. No significant canal stenosis.

Lumbar spine:

1. Moderate right foraminal stenosis at L3-L4. Mild foraminal
stenosis bilaterally at L4-L5 and on the right at L5-S1.
2. Mild canal stenosis at L3-L4.

## 2020-12-26 IMAGING — MR MR CERVICAL SPINE WO/W CM
5 of 8 series · 26 of 48 positions shown · non-contrast
Comparison: None.

CLINICAL DATA: Imbalance.  Frequent falls.

EXAM:
MRI CERVICAL, THORACIC AND LUMBAR SPINE WITHOUT CONTRAST
TECHNIQUE: Multiplanar and multiecho pulse sequences of the cervical spine, to
include the craniocervical junction and cervicothoracic junction,
and thoracic and lumbar spine, were obtained without intravenous
contrast.

[Series 1: T2 · sagittal · 3.0mm · 0.62mm/px · 4 of 15 slices shown (1 of 2)]
[im 1/15]
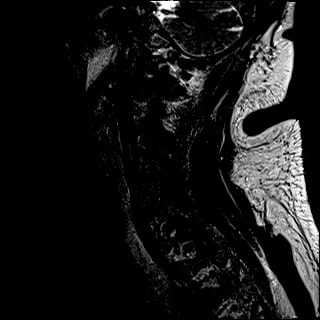
[im 5/15]
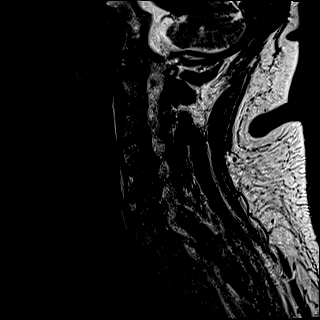
[im 10/15]
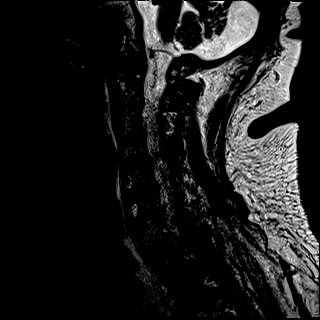
[im 15/15]
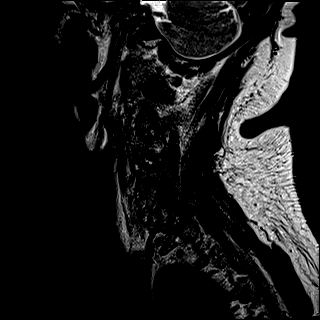

[Series 3: STIR · sagittal · 3.0mm · 0.62mm/px · 4 of 15 slices shown]
[im 1/15]
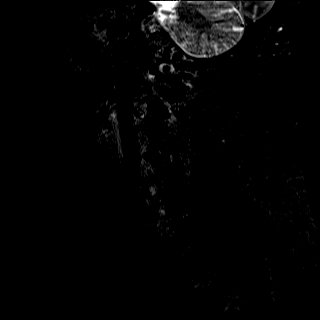
[im 5/15]
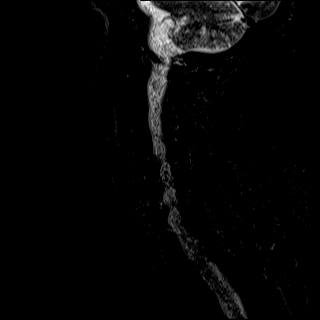
[im 10/15]
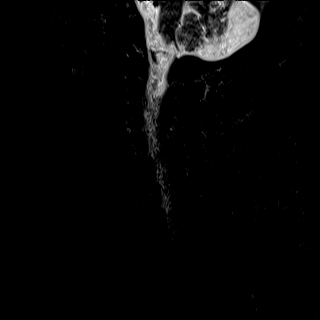
[im 15/15]
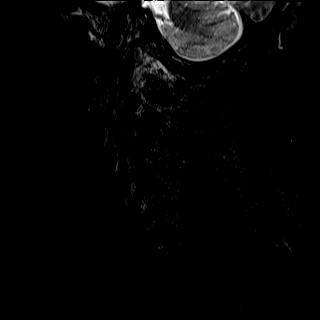

[Series 4: T2 · axial · 3.0mm · 0.70mm/px · z∈[-105,-10]mm · 8 of 29 slices shown (2 of 2)]
[im 1/29]
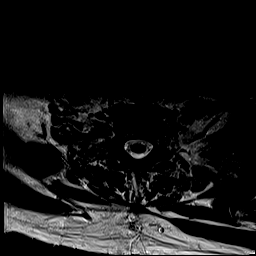
[im 5/29]
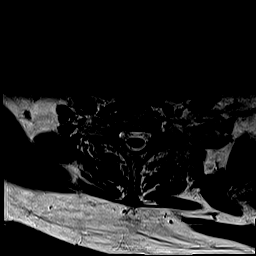
[im 9/29]
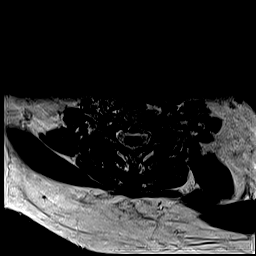
[im 13/29]
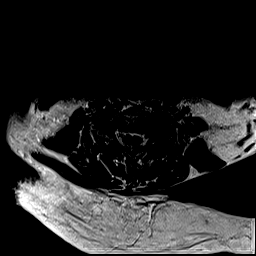
[im 17/29]
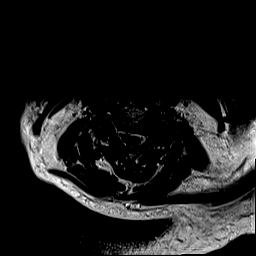
[im 21/29]
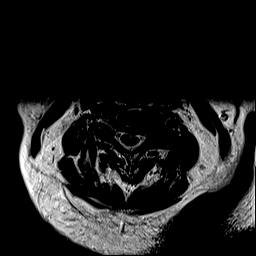
[im 25/29]
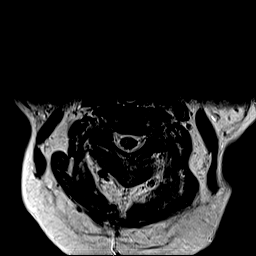
[im 29/29]
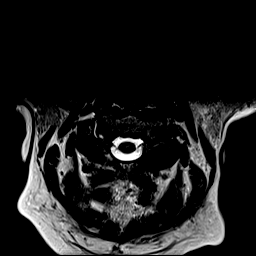

[Series 6: T1 · axial · non-contrast · 3.0mm · 0.35mm/px · z∈[-105,-10]mm · 8 of 29 slices shown]
[im 1/29]
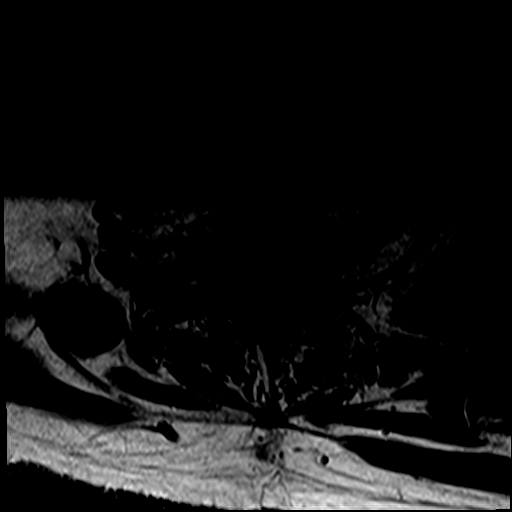
[im 5/29]
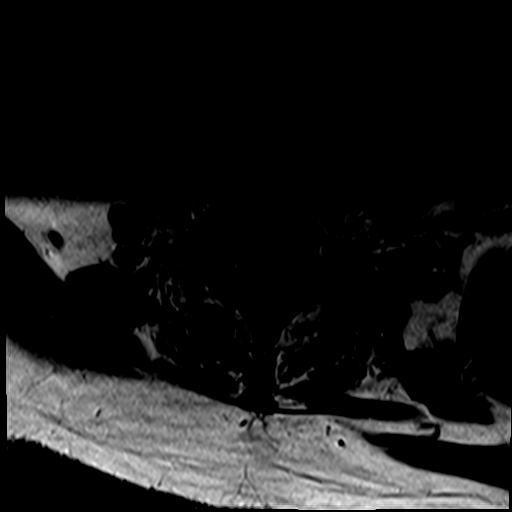
[im 9/29]
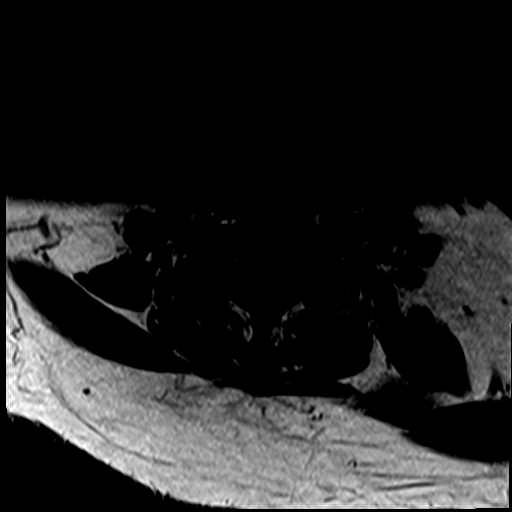
[im 13/29]
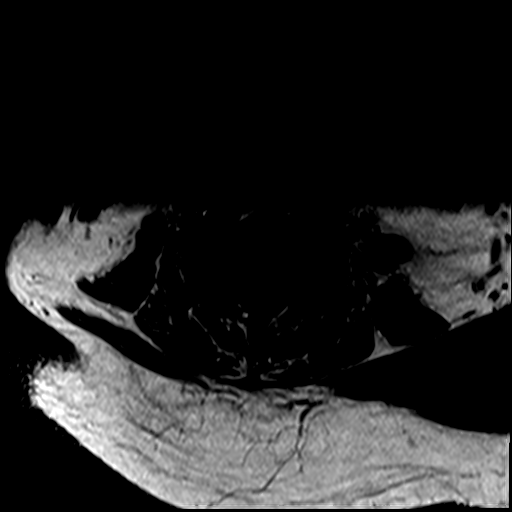
[im 17/29]
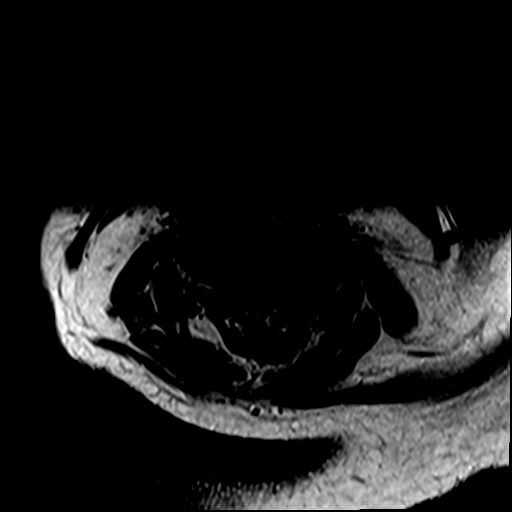
[im 21/29]
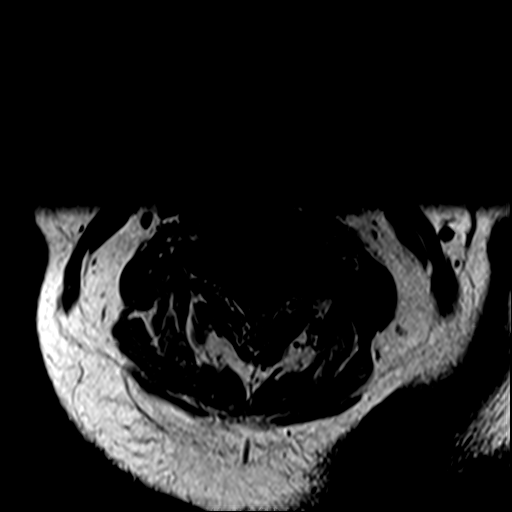
[im 25/29]
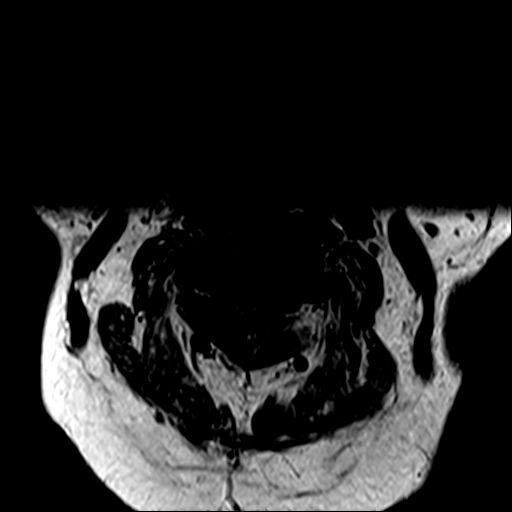
[im 29/29]
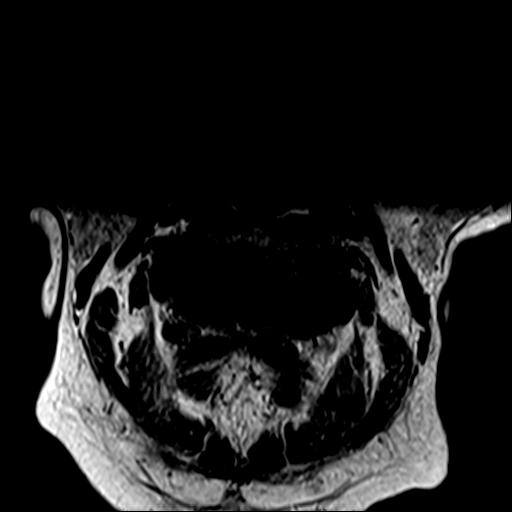

[Series 9: T1 post-contrast · axial · 3.0mm · 0.35mm/px · z∈[-99,-86]mm · 2 of 29 slices shown]
[im 1/29]
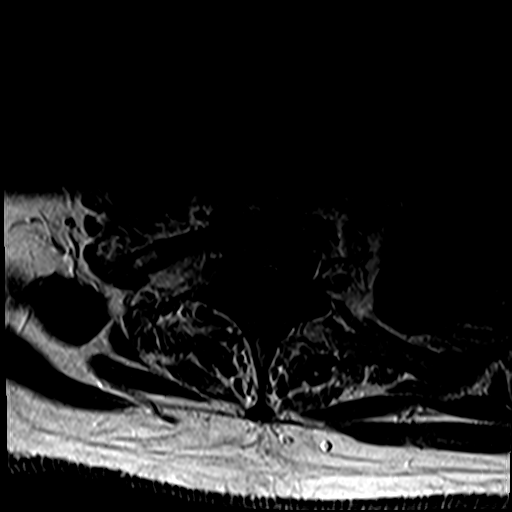
[im 5/29]
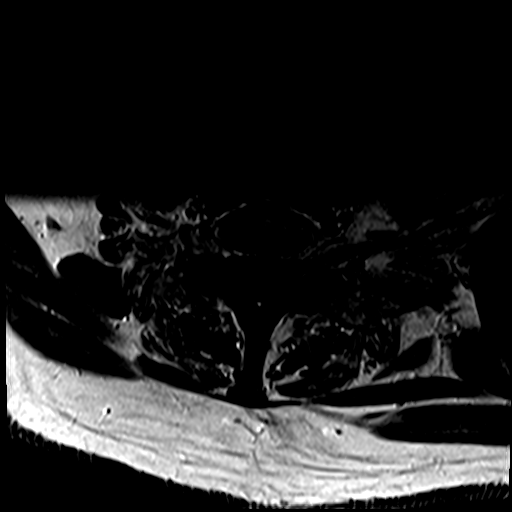

[26 of 48 positions shown; findings below may reference images not displayed]

FINDINGS: MRI CERVICAL SPINE FINDINGS

Some sequences (including STIR) are degraded by motion. Within this
limitation:

Alignment: Approximately 2 mm of anterolisthesis of C7 on T1.
Otherwise, no substantial sagittal subluxation. Mild straightening.

Vertebrae: Vertebral body heights are maintained. No specific
evidence of acute fracture, discitis/osteomyelitis, or suspicious
bone lesion. No abnormal enhancement. Degenerative/discogenic
endplate signal changes at C5-C6 and C6-C7.

Cord: Normal cord signal.  No cord enhancement.

Posterior Fossa, vertebral arteries, paraspinal tissues: Mild edema
and enhancement in the subcutaneous fat of the posterior/lower neck
overlying the T1 spinous process (series 8, image 8).

Disc levels:

C2-C3: Bilateral facet and uncovertebral hypertrophy without
significant canal or foraminal stenosis.

C3-C4: Small posterior disc osteophyte complex and left greater than
right facet and uncovertebral hypertrophy. Moderate left and mild
right foraminal stenosis. Posterior disc contacts and flattens the
left eccentric ventral cord without significant canal stenosis.

C4-C5: Posterior disc osteophyte complex and left greater than right
facet and uncovertebral hypertrophy. Resulting severe left and mild
right foraminal stenosis. Moderate canal stenosis.

C5-C6: Posterior disc osteophyte complex and left greater than right
facet and uncovertebral hypertrophy. Resulting severe left foraminal
stenosis and mild right foraminal stenosis. Mild canal stenosis.

C6-C7: Posterior disc osteophyte complex and left greater than right
facet and uncovertebral hypertrophy. Resulting severe left foraminal
stenosis and mild right foraminal stenosis. Mild canal stenosis.

C7-T1: No significant disc protrusion, foraminal stenosis, or canal
stenosis.

MRI THORACIC SPINE FINDINGS

Alignment:  No substantial sagittal subluxation.

Vertebrae: Degenerative/discogenic endplate signal changes about the
T4-T5 disc. Vertebral body heights are maintained. No specific
evidence of acute fracture, discitis/osteomyelitis, or suspicious
bone lesion.

Cord: Normal cord signal.  No abnormal enhancement.

Paraspinal and other soft tissues: Unremarkable

Disc levels:

Multiple small disc bulges without significant canal stenosis.
Multilevel facet hypertrophy. Mild to moderate bilateral foraminal
stenosis at T1-T2. Otherwise, multilevel mild foraminal stenosis in
the thoracic spine.

MRI LUMBAR SPINE FINDINGS

Segmentation:  Standard

Alignment:  Slight (2 mm) retrolisthesis of L5 on S1.

Vertebrae: Degenerative/discogenic endplate signal changes about the
L5-S1 disc. Vertebral body heights are maintained. No specific
evidence of acute fracture, discitis/osteomyelitis, or suspicious
bone lesion. No abnormal enhancement.

Conus medullaris and cauda equina: Conus extends to the L1 level.
Conus appears normal. No abnormal enhancement.

Paraspinal and other soft tissues: Unremarkable.

Disc levels:

T12-L1: Left subarticular/foraminal disc protrusion and mild
bilateral facet hypertrophy without significant canal or foraminal
stenosis.

L1-L2: Mild disc bulging and mild bilateral facet hypertrophy
without significant canal or foraminal stenosis.

L2-L3: Mild disc bulging and mild bilateral facet hypertrophy
without significant canal or foraminal stenosis.

L3-L4: Right eccentric disc bulge with superimposed inferiorly
dissecting central disc protrusion and right foraminal disc
protrusion. Mild bilateral facet hypertrophy. Resulting moderate
right foraminal stenosis. Mild canal stenosis and mild right greater
than left subarticular recess stenosis.

L4-L5: Broad disc bulge with small superimposed left subarticular
disc protrusion. Mild bilateral foraminal stenosis without
significant canal stenosis.

L5-S1: Disc desiccation and height loss. Approximately 2 mm of
retrolisthesis of L5 on S1. Central disc protrusion and small right
foraminal disc protrusion. Mild right foraminal stenosis without
significant canal stenosis.
IMPRESSION: Cervical spine:

1. Severe left and mild right foraminal stenosis at C4-C5, C5-C6 and
C6-C7. Moderate left and mild right foraminal stenosis C3-C4.
2. Moderate canal stenosis at C4-C5 and mild canal stenosis at C5-C6
and C6-C7.
3. Mild edema and enhancement in the subcutaneous fat of the
posterior/lower neck overlying the T1 spinous process, which could
be inflammatory, infectious or vascular.

Thoracic spine:

1. Mild to moderate bilateral foraminal stenosis at T1-T2.
Otherwise, multilevel mild foraminal stenosis.
2. No significant canal stenosis.

Lumbar spine:

1. Moderate right foraminal stenosis at L3-L4. Mild foraminal
stenosis bilaterally at L4-L5 and on the right at L5-S1.
2. Mild canal stenosis at L3-L4.

## 2020-12-26 IMAGING — CT CT ANGIO HEAD
2 of 11 series · 7 of 35 positions shown · IV contrast (omnipaque)
Comparison: same day MRI.  CT head [DATE].

CLINICAL DATA: Dizziness.

EXAM:
CT ANGIOGRAPHY HEAD AND NECK
TECHNIQUE: Multidetector CT imaging of the head and neck was performed using
the standard protocol during bolus administration of intravenous
contrast. Multiplanar CT image reconstructions and MIPs were
obtained to evaluate the vascular anatomy. Carotid stenosis
measurements (when applicable) are obtained utilizing NASCET
criteria, using the distal internal carotid diameter as the
denominator.
CONTRAST:  60mL OMNIPAQUE IOHEXOL 350 MG/ML SOLN

[Series 603: cta head neck thins · axial · 0.41mm/px · z∈[-51,+187]mm · 5 of 713 slices shown]
[im 119/713  soft-tissue]
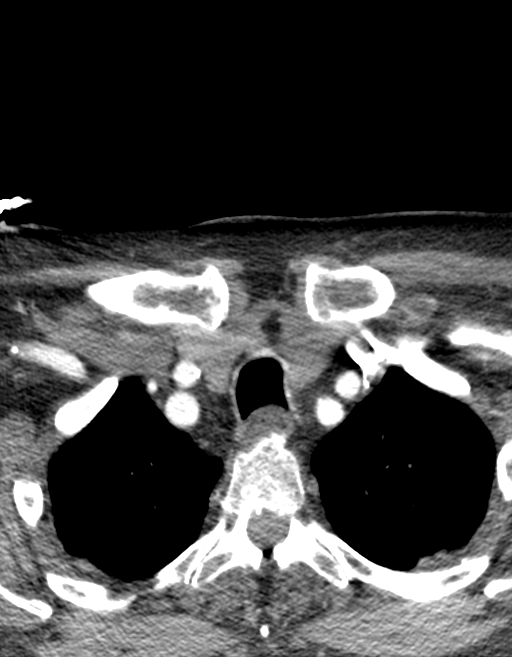
[im 238/713  bone]
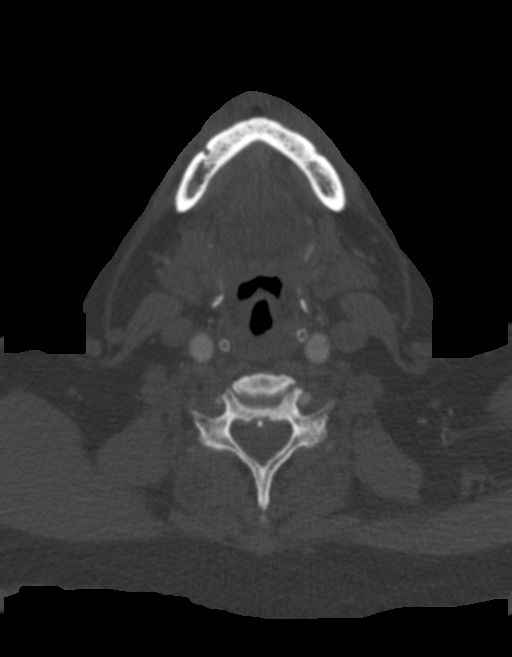
[im 357/713  soft-tissue]
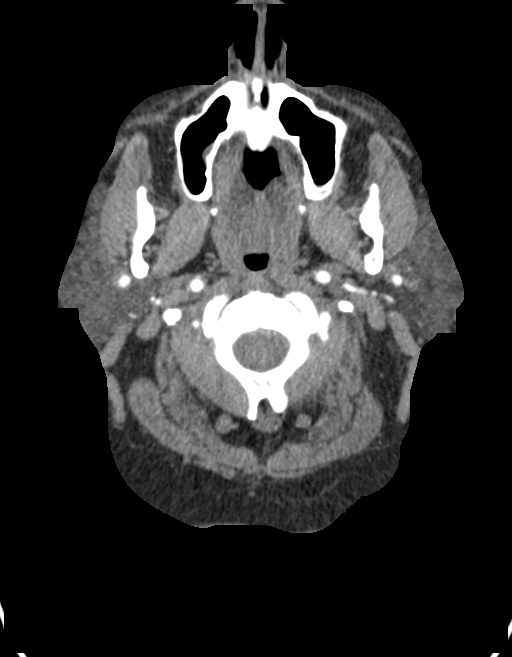
[im 475/713  bone]
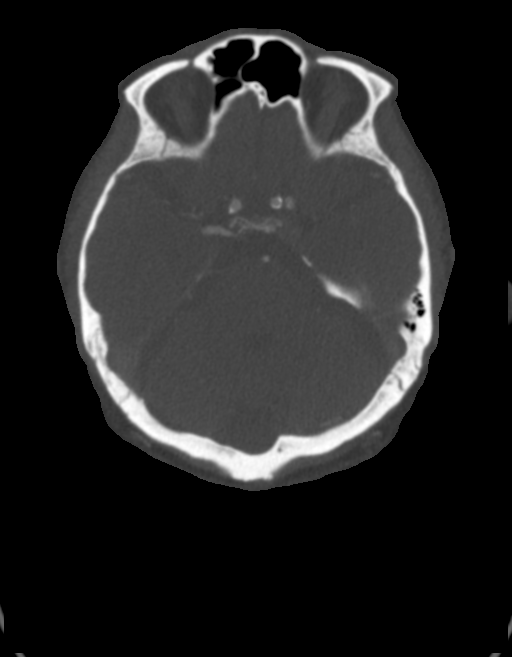
[im 594/713  soft-tissue]
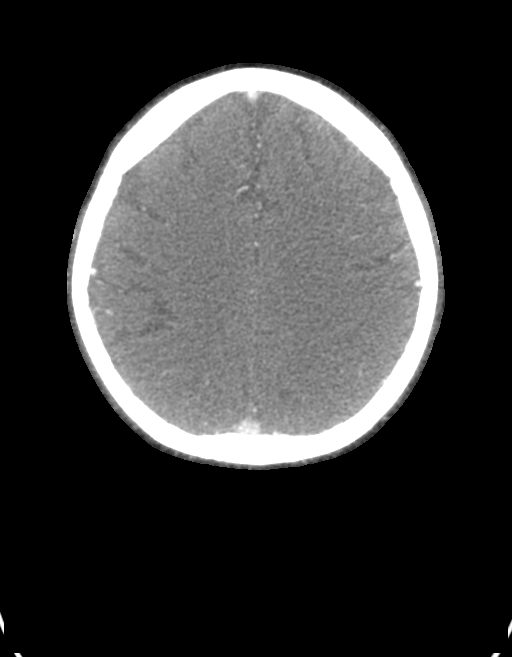

[Series 604: ax thin · axial · 0.41mm/px · z∈[+8,+127]mm · 2 of 357 slices shown]
[im 119/357  soft-tissue]
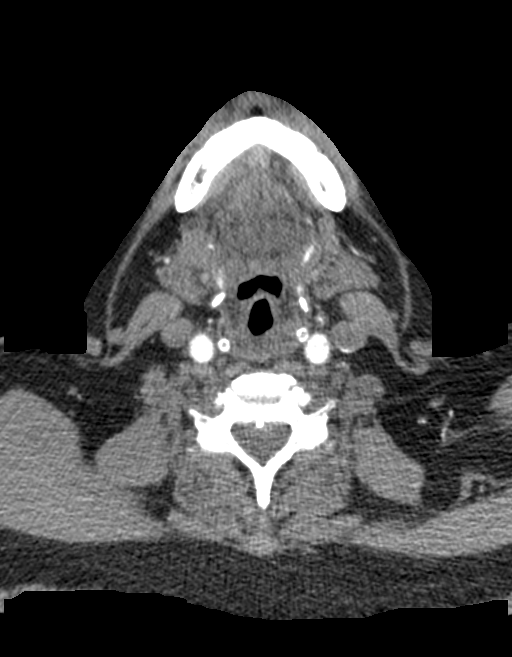
[im 238/357  soft-tissue]
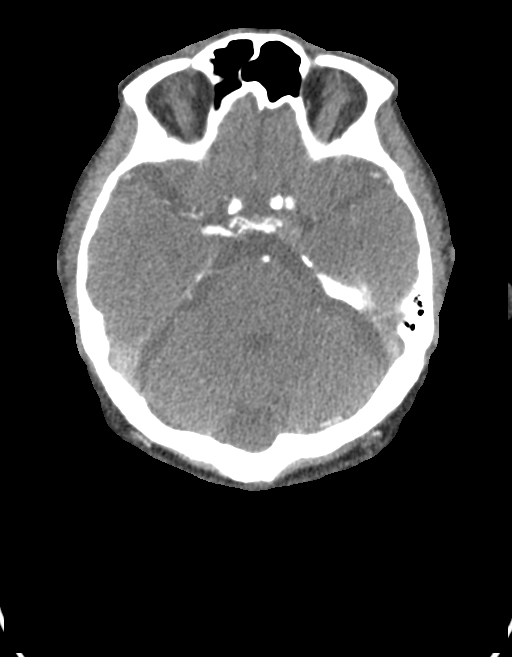

[7 of 35 positions shown; findings below may reference images not displayed]

FINDINGS: CTA NECK FINDINGS

Aortic arch: Imaged portion shows no evidence of aneurysm or
dissection. No significant stenosis of the major arch vessel
origins. Calcific atherosclerosis.

Right carotid system: Predominantly calcific atherosclerosis at the
bifurcation without greater than 50% stenosis. Tortuous ICA.

Left carotid system: Predominantly calcific atherosclerosis at the
bifurcation without greater than 50% stenosis. Tortuous ICA.

Vertebral arteries: Left dominant. No evidence of dissection,
stenosis (50% or greater) or occlusion. The non dominant right
vertebral artery is small throughout its course.

Skeleton: Severe degenerative disc disease at C6-C7 and moderate
degenerative disease at C5-C6 with disc height loss, endplate
sclerosis, endplate cystic change, and posterior disc osteophyte
complexes.

Other neck: No mass or suspicious adenopathy.

Upper chest: Visualized lung apices are clear.

Review of the MIP images confirms the above findings

CTA HEAD FINDINGS

Anterior circulation: Calcific atherosclerosis of bilateral
cavernous and paraclinoid ICAs without greater than 50% stenosis.
Bilateral MCAs and ACAs are patent without evidence of
hemodynamically significant proximal stenosis. No aneurysm
identified.

Posterior circulation: The small/non dominant right vertebral artery
makes a very small contribution to the basilar artery. Mild
narrowing of the intradural left vertebral artery secondary to
calcific atherosclerosis. Right fetal type PCA. Bilateral posterior
cerebral arteries are patent without evidence of hemodynamically
significant proximal stenosis. Mild multifocal
narrowing/irregularity of the PCAs, likely related to
atherosclerosis. No aneurysm identified.

Venous sinuses: As permitted by contrast timing, patent.

Anatomic variants: Right fetal type PCA.

Review of the MIP images confirms the above findings
IMPRESSION: 1. No evidence of emergent large vessel occlusion or hemodynamically
significant proximal stenosis in the head or neck.
2. Multiple areas of mild narrowing secondary to atherosclerosis, as
detailed above.

## 2020-12-26 IMAGING — MR MR LUMBAR SPINE WO/W CM
6 of 7 series · 31 of 48 positions shown · IV contrast (10ml Gadavist)
Comparison: None.

CLINICAL DATA: Imbalance.  Frequent falls.

EXAM:
MRI CERVICAL, THORACIC AND LUMBAR SPINE WITHOUT CONTRAST
TECHNIQUE: Multiplanar and multiecho pulse sequences of the cervical spine, to
include the craniocervical junction and cervicothoracic junction,
and thoracic and lumbar spine, were obtained without intravenous
contrast.

[Series 1: T2 · sagittal · 4.0mm · 1.02mm/px · 5 of 16 slices shown (1 of 2)]
[im 1/16]
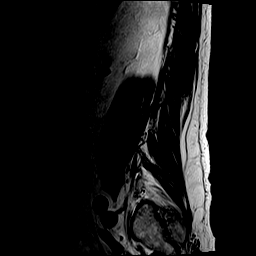
[im 4/16]
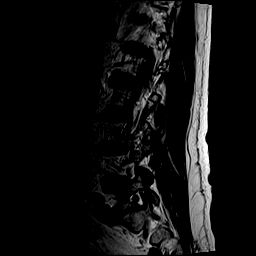
[im 8/16]
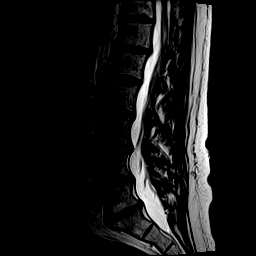
[im 12/16]
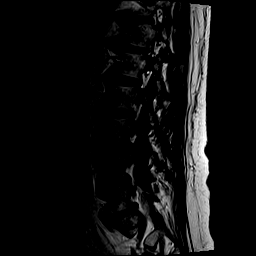
[im 16/16]
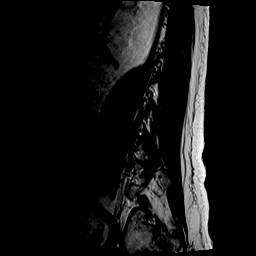

[Series 2: T1 · sagittal · 4.0mm · 1.02mm/px · 5 of 16 slices shown (1 of 2)]
[im 1/16]
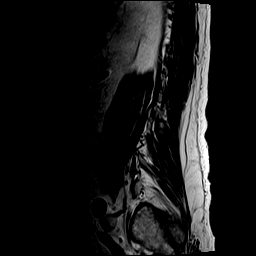
[im 4/16]
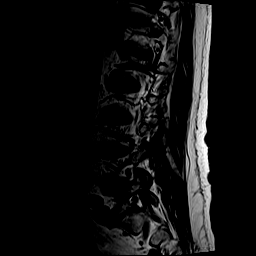
[im 8/16]
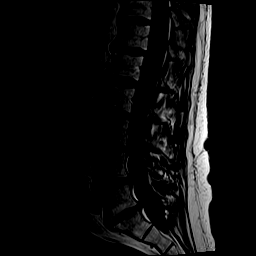
[im 12/16]
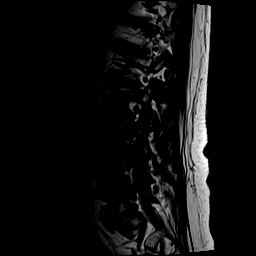
[im 16/16]
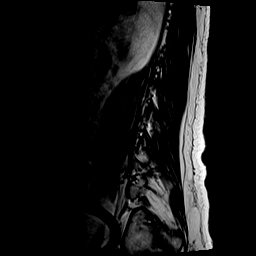

[Series 3: STIR · sagittal · 4.0mm · 0.51mm/px · 1 of 16 slices shown]
[im 1/16]
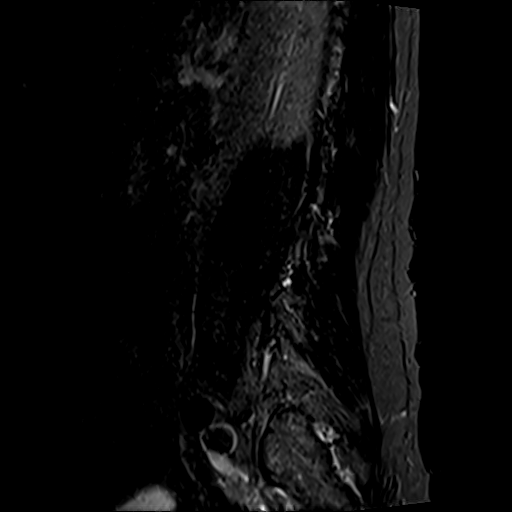

[Series 4: T2 · axial · 4.0mm · 0.78mm/px · z∈[-580,-362]mm · 8 of 36 slices shown (2 of 2)]
[im 1/36]
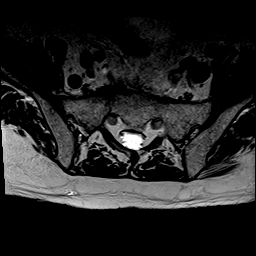
[im 4/36]
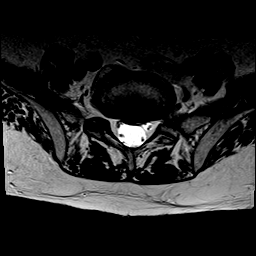
[im 12/36]
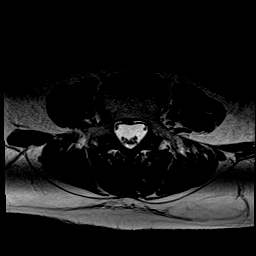
[im 16/36]
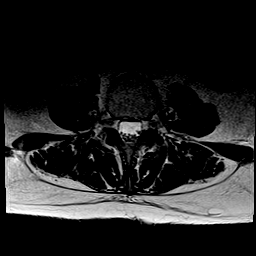
[im 20/36]
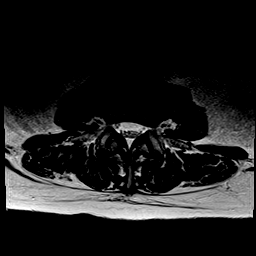
[im 24/36]
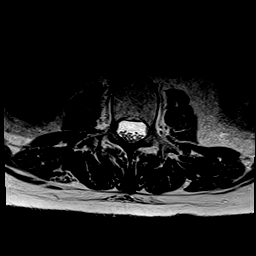
[im 32/36]
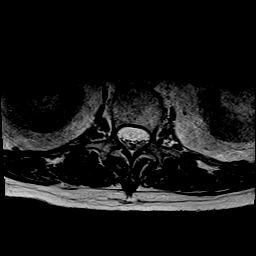
[im 36/36]
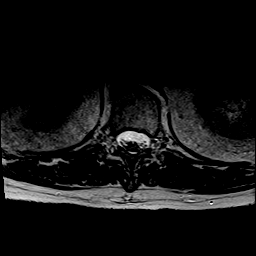

[Series 5: T1 · axial · 4.0mm · 0.39mm/px · z∈[-580,-362]mm · 8 of 36 slices shown (2 of 2)]
[im 1/36]
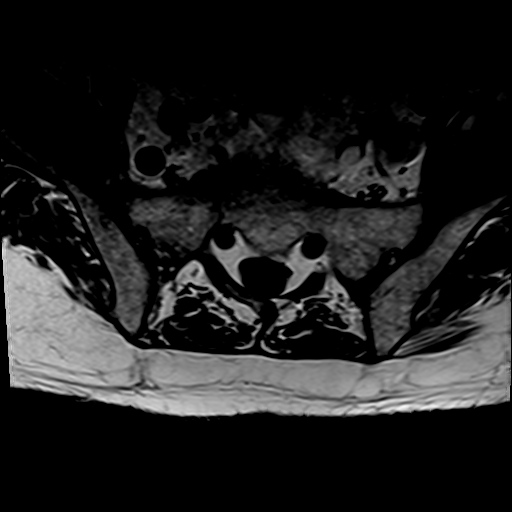
[im 4/36]
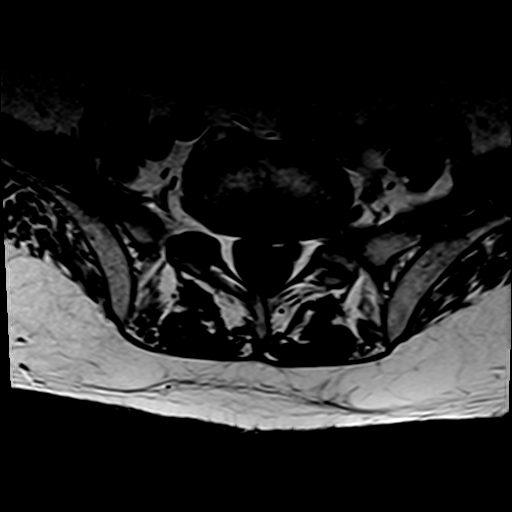
[im 12/36]
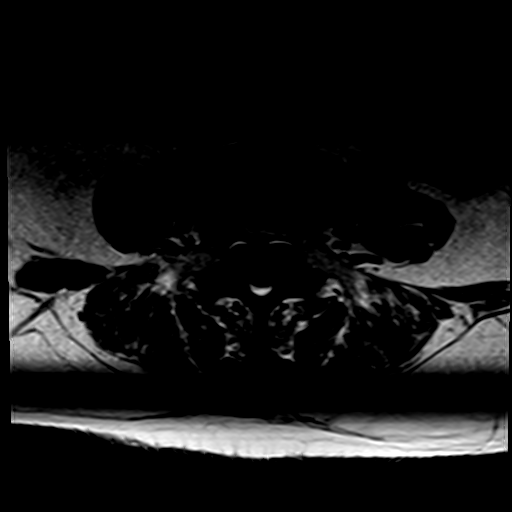
[im 16/36]
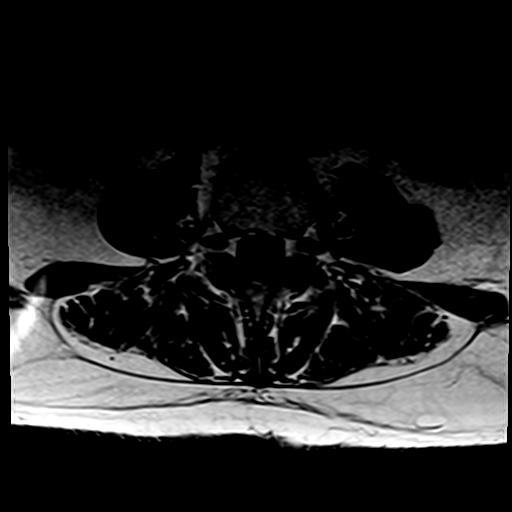
[im 20/36]
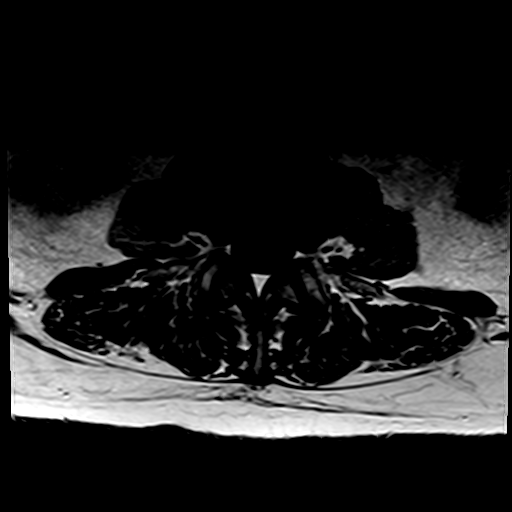
[im 24/36]
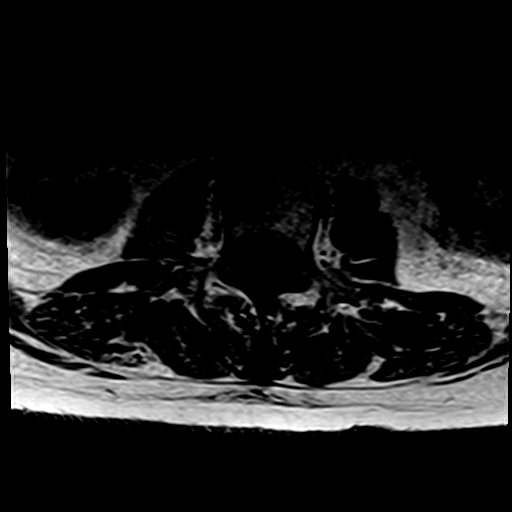
[im 32/36]
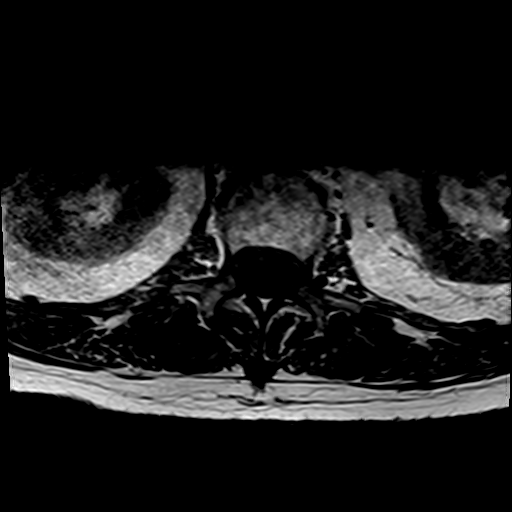
[im 36/36]
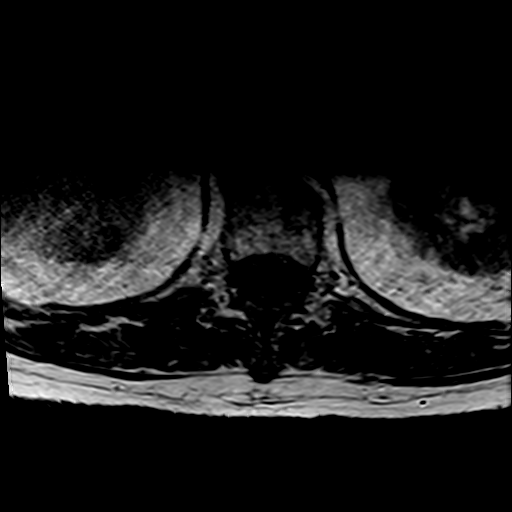

[Series 6: T1 fat-sat post-contrast · sagittal · 4.0mm · 1.02mm/px · 4 of 16 slices shown]
[im 1/16]
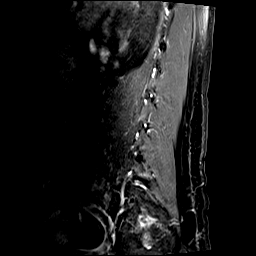
[im 6/16]
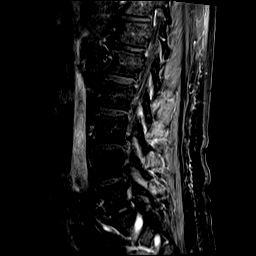
[im 11/16]
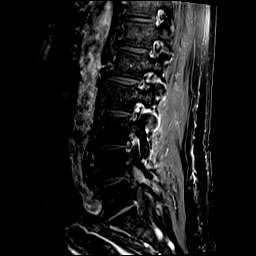
[im 16/16]
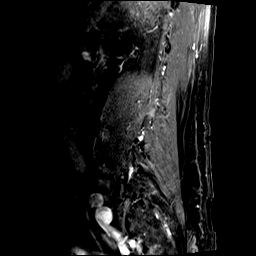

[31 of 48 positions shown; findings below may reference images not displayed]

FINDINGS: MRI CERVICAL SPINE FINDINGS

Some sequences (including STIR) are degraded by motion. Within this
limitation:

Alignment: Approximately 2 mm of anterolisthesis of C7 on T1.
Otherwise, no substantial sagittal subluxation. Mild straightening.

Vertebrae: Vertebral body heights are maintained. No specific
evidence of acute fracture, discitis/osteomyelitis, or suspicious
bone lesion. No abnormal enhancement. Degenerative/discogenic
endplate signal changes at C5-C6 and C6-C7.

Cord: Normal cord signal.  No cord enhancement.

Posterior Fossa, vertebral arteries, paraspinal tissues: Mild edema
and enhancement in the subcutaneous fat of the posterior/lower neck
overlying the T1 spinous process (series 8, image 8).

Disc levels:

C2-C3: Bilateral facet and uncovertebral hypertrophy without
significant canal or foraminal stenosis.

C3-C4: Small posterior disc osteophyte complex and left greater than
right facet and uncovertebral hypertrophy. Moderate left and mild
right foraminal stenosis. Posterior disc contacts and flattens the
left eccentric ventral cord without significant canal stenosis.

C4-C5: Posterior disc osteophyte complex and left greater than right
facet and uncovertebral hypertrophy. Resulting severe left and mild
right foraminal stenosis. Moderate canal stenosis.

C5-C6: Posterior disc osteophyte complex and left greater than right
facet and uncovertebral hypertrophy. Resulting severe left foraminal
stenosis and mild right foraminal stenosis. Mild canal stenosis.

C6-C7: Posterior disc osteophyte complex and left greater than right
facet and uncovertebral hypertrophy. Resulting severe left foraminal
stenosis and mild right foraminal stenosis. Mild canal stenosis.

C7-T1: No significant disc protrusion, foraminal stenosis, or canal
stenosis.

MRI THORACIC SPINE FINDINGS

Alignment:  No substantial sagittal subluxation.

Vertebrae: Degenerative/discogenic endplate signal changes about the
T4-T5 disc. Vertebral body heights are maintained. No specific
evidence of acute fracture, discitis/osteomyelitis, or suspicious
bone lesion.

Cord: Normal cord signal.  No abnormal enhancement.

Paraspinal and other soft tissues: Unremarkable

Disc levels:

Multiple small disc bulges without significant canal stenosis.
Multilevel facet hypertrophy. Mild to moderate bilateral foraminal
stenosis at T1-T2. Otherwise, multilevel mild foraminal stenosis in
the thoracic spine.

MRI LUMBAR SPINE FINDINGS

Segmentation:  Standard

Alignment:  Slight (2 mm) retrolisthesis of L5 on S1.

Vertebrae: Degenerative/discogenic endplate signal changes about the
L5-S1 disc. Vertebral body heights are maintained. No specific
evidence of acute fracture, discitis/osteomyelitis, or suspicious
bone lesion. No abnormal enhancement.

Conus medullaris and cauda equina: Conus extends to the L1 level.
Conus appears normal. No abnormal enhancement.

Paraspinal and other soft tissues: Unremarkable.

Disc levels:

T12-L1: Left subarticular/foraminal disc protrusion and mild
bilateral facet hypertrophy without significant canal or foraminal
stenosis.

L1-L2: Mild disc bulging and mild bilateral facet hypertrophy
without significant canal or foraminal stenosis.

L2-L3: Mild disc bulging and mild bilateral facet hypertrophy
without significant canal or foraminal stenosis.

L3-L4: Right eccentric disc bulge with superimposed inferiorly
dissecting central disc protrusion and right foraminal disc
protrusion. Mild bilateral facet hypertrophy. Resulting moderate
right foraminal stenosis. Mild canal stenosis and mild right greater
than left subarticular recess stenosis.

L4-L5: Broad disc bulge with small superimposed left subarticular
disc protrusion. Mild bilateral foraminal stenosis without
significant canal stenosis.

L5-S1: Disc desiccation and height loss. Approximately 2 mm of
retrolisthesis of L5 on S1. Central disc protrusion and small right
foraminal disc protrusion. Mild right foraminal stenosis without
significant canal stenosis.
IMPRESSION: Cervical spine:

1. Severe left and mild right foraminal stenosis at C4-C5, C5-C6 and
C6-C7. Moderate left and mild right foraminal stenosis C3-C4.
2. Moderate canal stenosis at C4-C5 and mild canal stenosis at C5-C6
and C6-C7.
3. Mild edema and enhancement in the subcutaneous fat of the
posterior/lower neck overlying the T1 spinous process, which could
be inflammatory, infectious or vascular.

Thoracic spine:

1. Mild to moderate bilateral foraminal stenosis at T1-T2.
Otherwise, multilevel mild foraminal stenosis.
2. No significant canal stenosis.

Lumbar spine:

1. Moderate right foraminal stenosis at L3-L4. Mild foraminal
stenosis bilaterally at L4-L5 and on the right at L5-S1.
2. Mild canal stenosis at L3-L4.

## 2020-12-26 IMAGING — MR MR HEAD W/O CM
11 series · 48 of 48 positions shown · non-contrast
Comparison: None.

CLINICAL DATA: Vertigo

EXAM:
MRI HEAD WITHOUT CONTRAST
TECHNIQUE: Multiplanar, multiecho pulse sequences of the brain and surrounding
structures were obtained without intravenous contrast.

[Series 5: ax dwi_tracew · axial · 3.0mm · 0.71mm/px · z∈[-68,+97]mm · 5 of 56 slices shown]
[im 1/56]
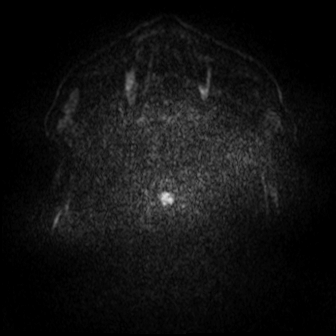
[im 14/56]
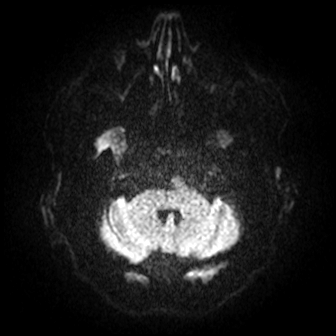
[im 28/56]
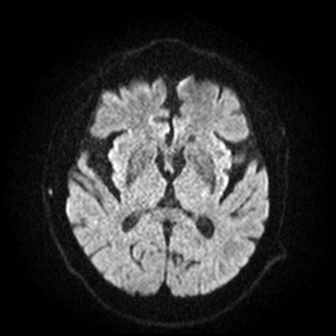
[im 42/56]
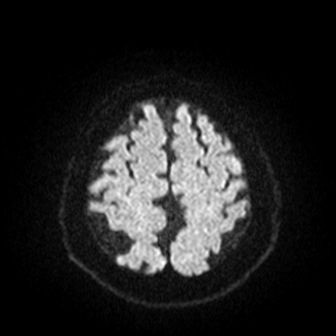
[im 56/56]
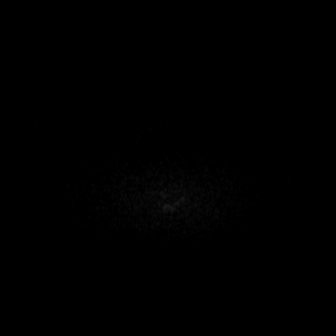

[Series 6: ax dwi_adc · axial · 3.0mm · 0.71mm/px · z∈[-68,+94]mm · 5 of 55 slices shown]
[im 1/55]
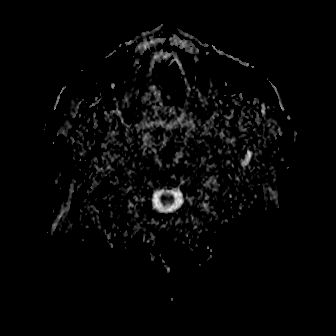
[im 14/55]
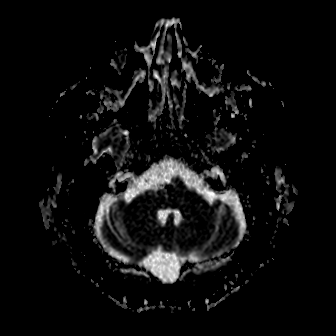
[im 28/55]
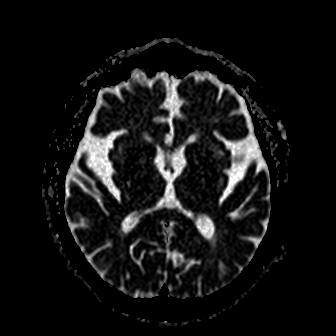
[im 41/55]
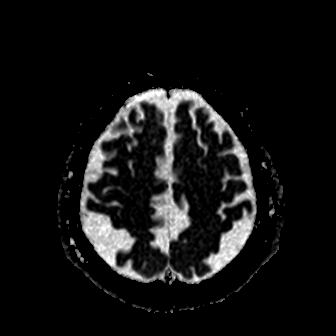
[im 55/55]
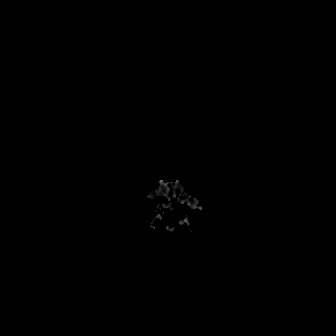

[Series 7: cor dwi_tracew · coronal · 5.0mm · 0.68mm/px · 3 of 40 slices shown]
[im 1/40]
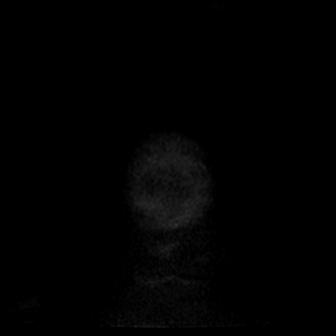
[im 20/40]
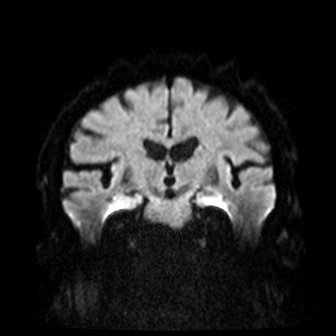
[im 40/40]
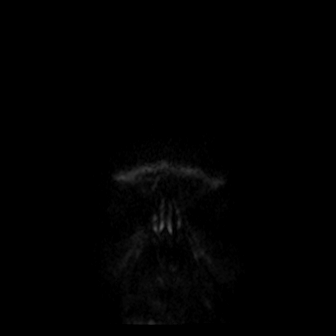

[Series 8: cor dwi_adc · coronal · 5.0mm · 0.68mm/px · 3 of 40 slices shown]
[im 1/40]
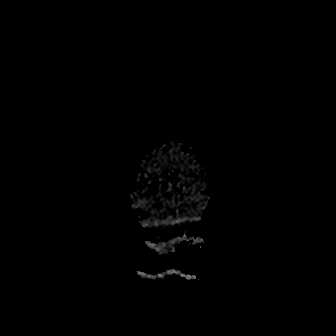
[im 20/40]
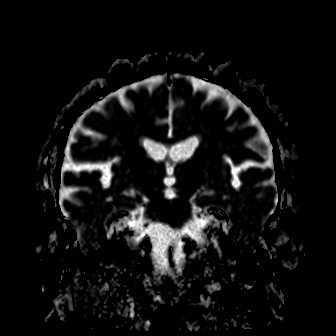
[im 40/40]
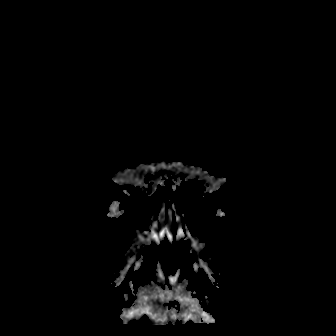

[Series 9: T1 · sagittal · 5.0mm · 0.47mm/px · 2 of 24 slices shown (1 of 2)]
[im 1/24]
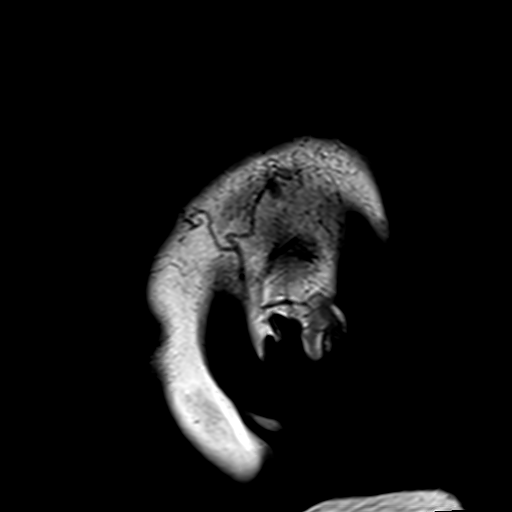
[im 24/24]
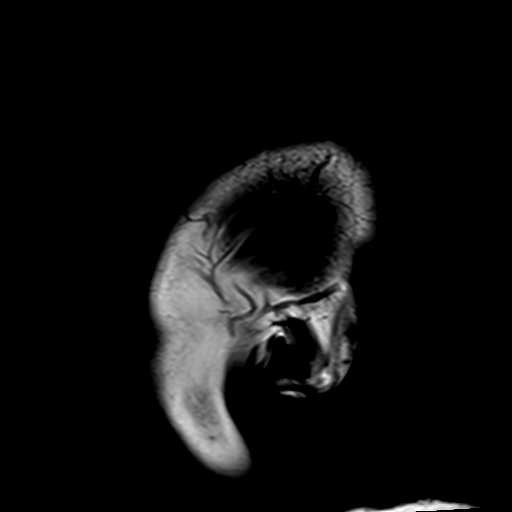

[Series 10: T2 · axial · 5.0mm · 0.86mm/px · z∈[-63,+93]mm · 2 of 27 slices shown (1 of 2)]
[im 1/27]
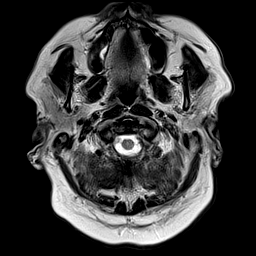
[im 27/27]
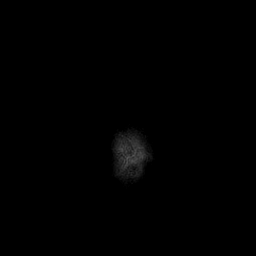

[Series 12: pha_images · axial · 3.0mm · 0.90mm/px · z∈[-62,+91]mm · 4 of 52 slices shown]
[im 1/52]
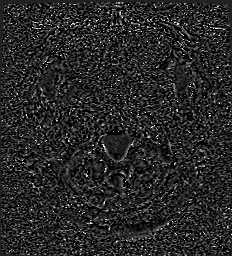
[im 18/52]
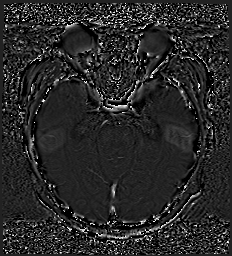
[im 35/52]
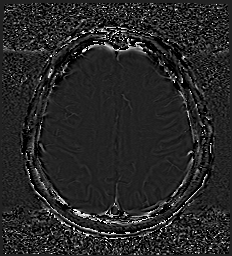
[im 52/52]
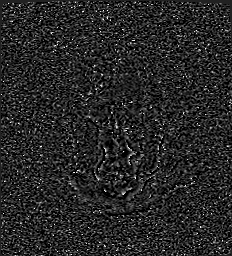

[Series 13: swi_images · axial · 3.0mm · 0.90mm/px · z∈[-62,+91]mm · 4 of 52 slices shown]
[im 1/52]
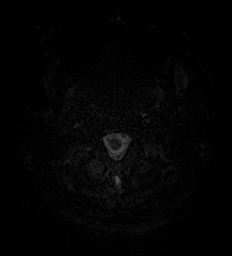
[im 18/52]
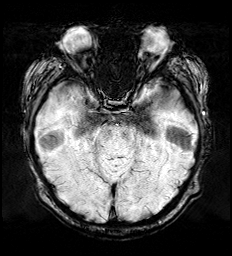
[im 35/52]
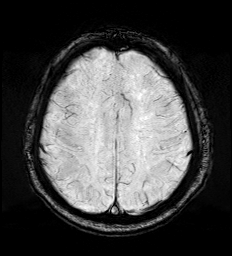
[im 52/52]
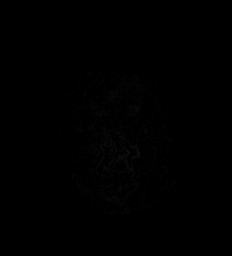

[Series 15: FLAIR · axial · 3.0mm · 0.69mm/px · z∈[-66,+96]mm · 4 of 55 slices shown]
[im 1/55]
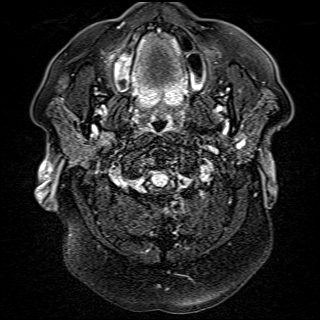
[im 19/55]
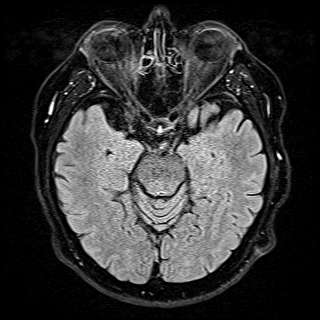
[im 37/55]
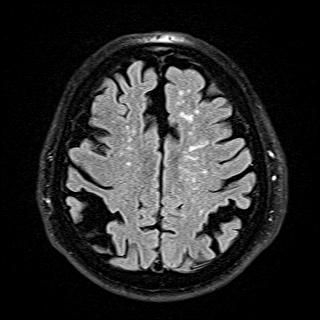
[im 55/55]
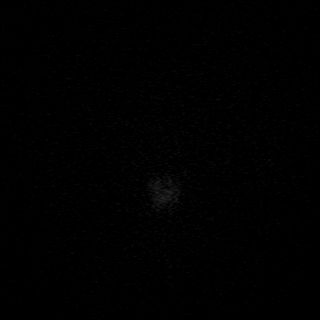

[Series 16: T1 · axial · 1.0mm · 0.98mm/px · z∈[-79,+109]mm · 14 of 184 slices shown (2 of 2)]
[im 1/184]
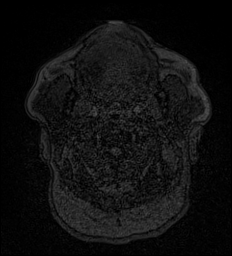
[im 15/184]
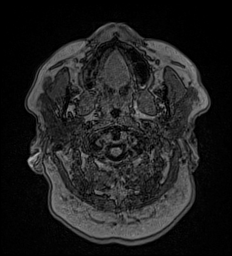
[im 29/184]
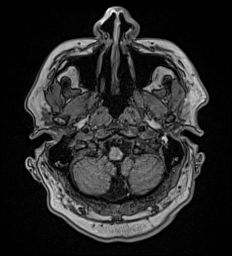
[im 43/184]
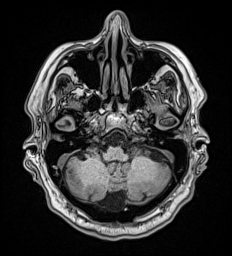
[im 57/184]
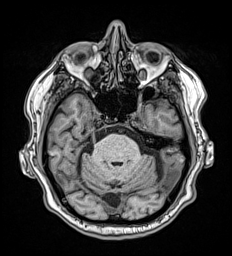
[im 71/184]
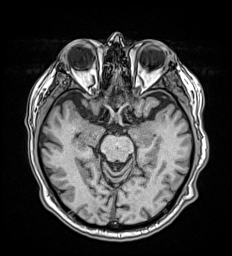
[im 85/184]
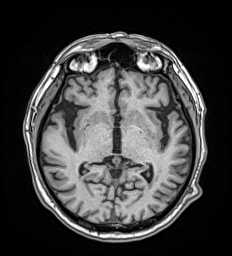
[im 99/184]
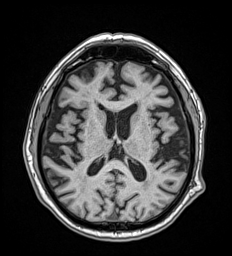
[im 113/184]
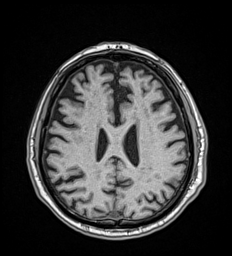
[im 127/184]
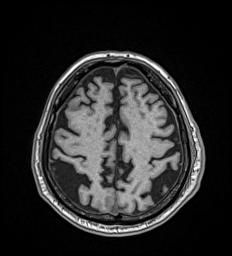
[im 141/184]
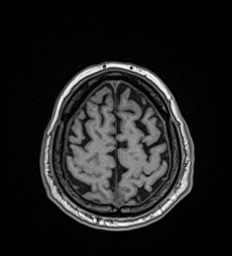
[im 155/184]
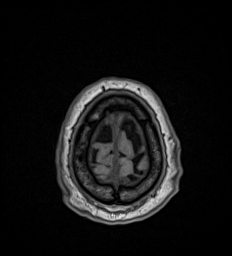
[im 169/184]
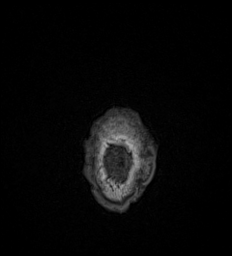
[im 184/184]
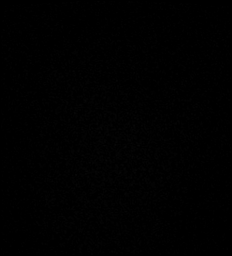

[Series 17: T2 · coronal · 5.0mm · 0.86mm/px · 2 of 30 slices shown (2 of 2)]
[im 1/30]
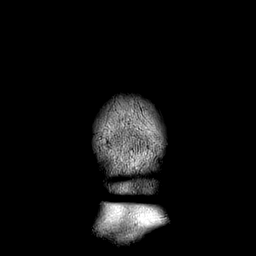
[im 30/30]
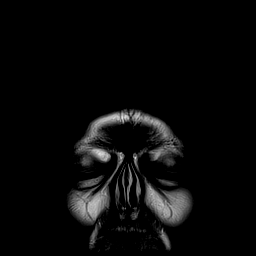

[48 of 48 positions shown; findings below may reference images not displayed]

FINDINGS: Brain: No acute infarction, hemorrhage, or hydrocephalus. No
intraparenchymal mass lesion. Moderate scattered T2/FLAIR
hyperintensities within the white matter, nonspecific but most
likely related to chronic microvascular ischemic disease given
patient age. Moderate generalized cerebral atrophy. Along the vertex
posteriorly there is prominence of the extra-axial spaces with mild
mass effect on the adjacent parietal lobes (series 10, image 20),
suspected to represent small arachnoid cysts. Additionally there is
mild prominence of the retro cerebellar CSF on the right, likely
mega cisterna magna or an additional small right arachnoid cyst. No
midline shift. Basal cisterns are patent.

Vascular: Major arterial flow voids are maintained at the skull
base. Non dominant right vertebral artery and tortuous
vertebrobasilar system.

Skull and upper cervical spine: Normal marrow signal.

Sinuses/Orbits: Mild ethmoid air cell and right frontal sinus
mucosal thickening. No air-fluid levels. Unremarkable orbits. Right
lens extraction.

Other: No mastoid effusions.
IMPRESSION: 1. No evidence of acute intracranial abnormality. Specifically, no
acute infarct.
2. Moderate chronic microvascular ischemic disease and generalized
cerebral atrophy.
3. Suspected high biparietal convexity arachnoid cysts with mild
mass effect, unlikely to be of clinical significance.

## 2020-12-26 IMAGING — CT CT ANGIO NECK
2 of 7 series · 8 of 33 positions shown · IV contrast (APPLIED)
Comparison: same day MRI.  CT head [DATE].

CLINICAL DATA: Dizziness.

EXAM:
CT ANGIOGRAPHY HEAD AND NECK
TECHNIQUE: Multidetector CT imaging of the head and neck was performed using
the standard protocol during bolus administration of intravenous
contrast. Multiplanar CT image reconstructions and MIPs were
obtained to evaluate the vascular anatomy. Carotid stenosis
measurements (when applicable) are obtained utilizing NASCET
criteria, using the distal internal carotid diameter as the
denominator.
CONTRAST:  60mL OMNIPAQUE IOHEXOL 350 MG/ML SOLN

[Series 4: cta head neck · axial · 0.49mm/px · z∈[+9,+131]mm · 2 of 184 slices shown]
[im 62/184  soft-tissue]
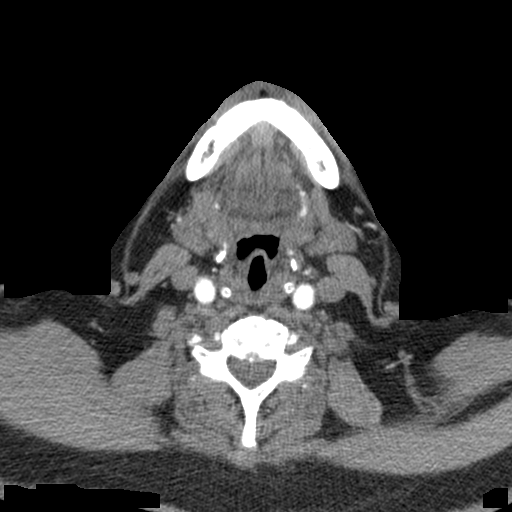
[im 123/184  soft-tissue]
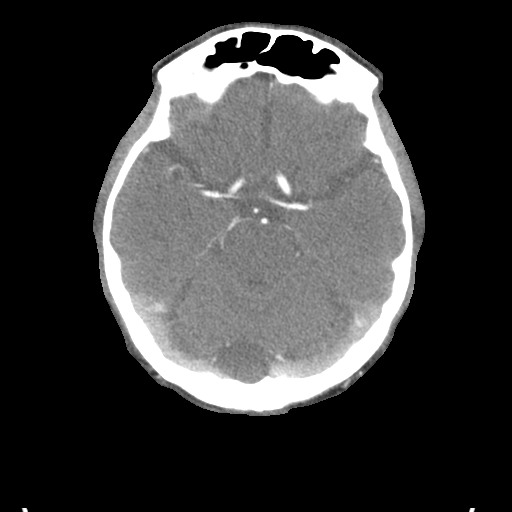

[Series 6: ax thin · axial · 0.41mm/px · z∈[-60,+195]mm · 6 of 357 slices shown]
[im 51/357  soft-tissue]
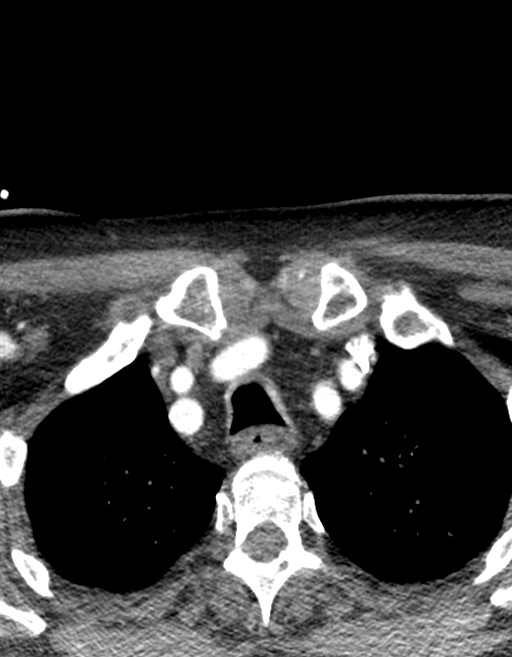
[im 102/357  bone]
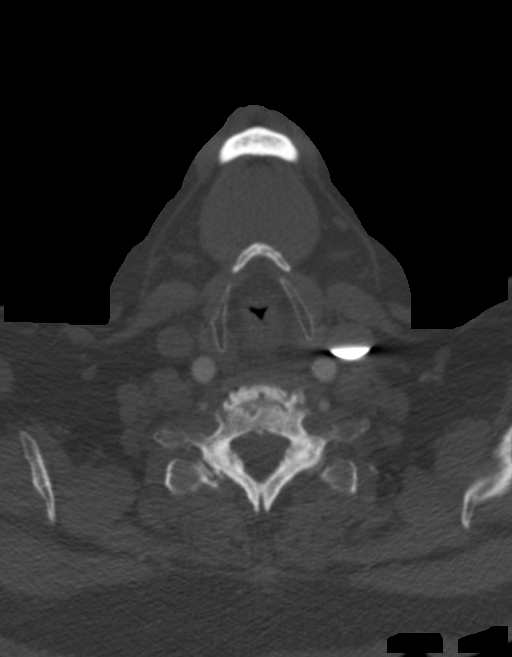
[im 153/357  soft-tissue]
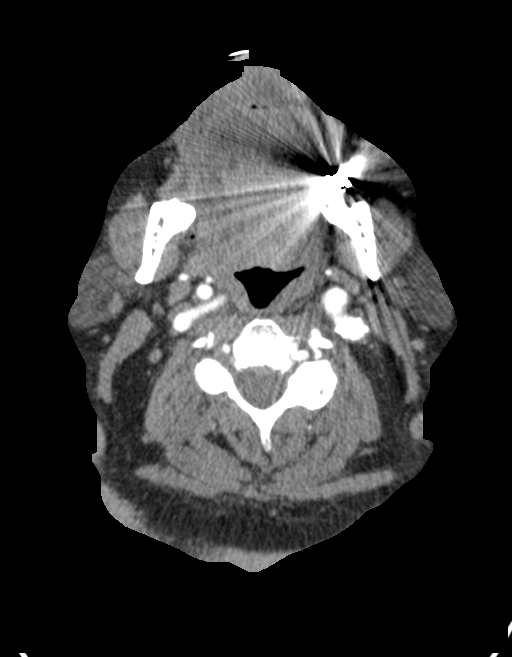
[im 204/357  bone]
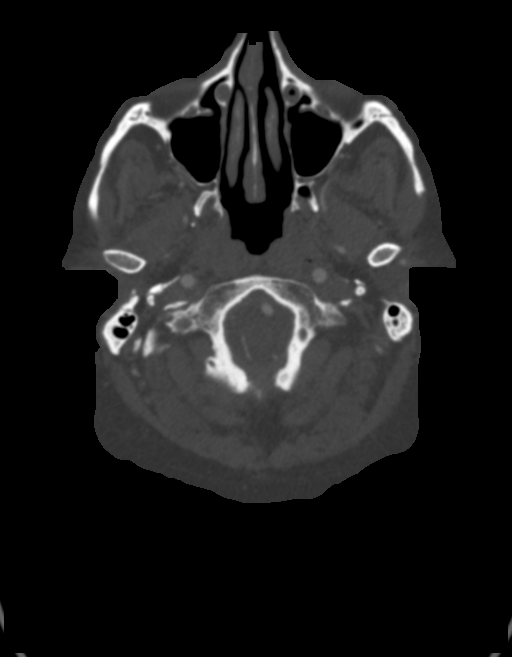
[im 255/357  soft-tissue]
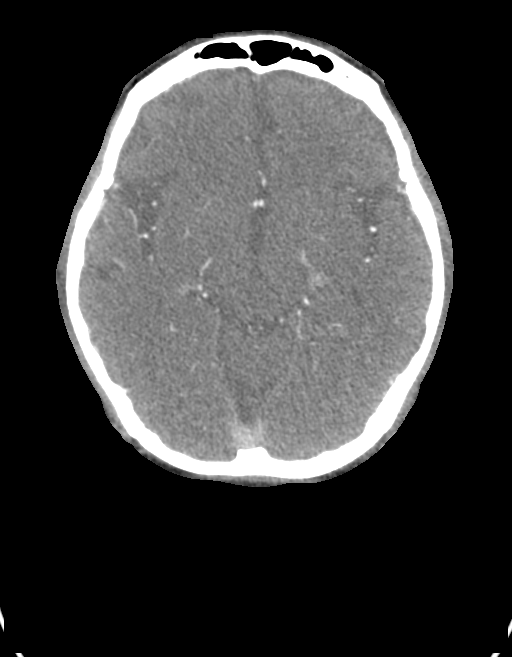
[im 306/357  bone]
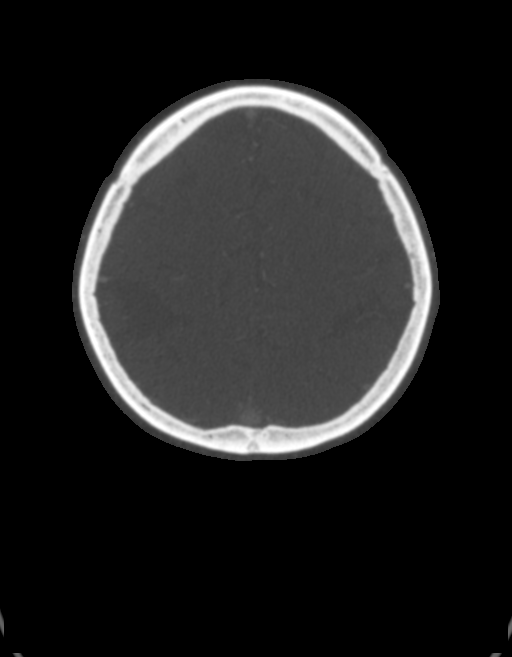

[8 of 33 positions shown; findings below may reference images not displayed]

FINDINGS: CTA NECK FINDINGS

Aortic arch: Imaged portion shows no evidence of aneurysm or
dissection. No significant stenosis of the major arch vessel
origins. Calcific atherosclerosis.

Right carotid system: Predominantly calcific atherosclerosis at the
bifurcation without greater than 50% stenosis. Tortuous ICA.

Left carotid system: Predominantly calcific atherosclerosis at the
bifurcation without greater than 50% stenosis. Tortuous ICA.

Vertebral arteries: Left dominant. No evidence of dissection,
stenosis (50% or greater) or occlusion. The non dominant right
vertebral artery is small throughout its course.

Skeleton: Severe degenerative disc disease at C6-C7 and moderate
degenerative disease at C5-C6 with disc height loss, endplate
sclerosis, endplate cystic change, and posterior disc osteophyte
complexes.

Other neck: No mass or suspicious adenopathy.

Upper chest: Visualized lung apices are clear.

Review of the MIP images confirms the above findings

CTA HEAD FINDINGS

Anterior circulation: Calcific atherosclerosis of bilateral
cavernous and paraclinoid ICAs without greater than 50% stenosis.
Bilateral MCAs and ACAs are patent without evidence of
hemodynamically significant proximal stenosis. No aneurysm
identified.

Posterior circulation: The small/non dominant right vertebral artery
makes a very small contribution to the basilar artery. Mild
narrowing of the intradural left vertebral artery secondary to
calcific atherosclerosis. Right fetal type PCA. Bilateral posterior
cerebral arteries are patent without evidence of hemodynamically
significant proximal stenosis. Mild multifocal
narrowing/irregularity of the PCAs, likely related to
atherosclerosis. No aneurysm identified.

Venous sinuses: As permitted by contrast timing, patent.

Anatomic variants: Right fetal type PCA.

Review of the MIP images confirms the above findings
IMPRESSION: 1. No evidence of emergent large vessel occlusion or hemodynamically
significant proximal stenosis in the head or neck.
2. Multiple areas of mild narrowing secondary to atherosclerosis, as
detailed above.

## 2020-12-26 MED ORDER — SUCRALFATE 1 G PO TABS
1.0000 g | ORAL_TABLET | Freq: Three times a day (TID) | ORAL | Status: DC
  Administered 2020-12-26 – 2020-12-27 (×2): 1 g via ORAL
  Filled 2020-12-26 (×2): qty 1

## 2020-12-26 MED ORDER — GADOBUTROL 1 MMOL/ML IV SOLN
10.0000 mL | Freq: Once | INTRAVENOUS | Status: AC | PRN
  Administered 2020-12-26: 10 mL via INTRAVENOUS

## 2020-12-26 MED ORDER — ATORVASTATIN CALCIUM 20 MG PO TABS
40.0000 mg | ORAL_TABLET | Freq: Every day | ORAL | Status: DC
  Administered 2020-12-26 – 2020-12-27 (×2): 40 mg via ORAL
  Filled 2020-12-26 (×2): qty 2

## 2020-12-26 MED ORDER — IOHEXOL 350 MG/ML SOLN
60.0000 mL | Freq: Once | INTRAVENOUS | Status: AC | PRN
  Administered 2020-12-26: 60 mL via INTRAVENOUS

## 2020-12-26 MED ORDER — PANTOPRAZOLE SODIUM 40 MG PO TBEC: 80.0000 mg | DELAYED_RELEASE_TABLET | Freq: Every day | ORAL | Status: DC

## 2020-12-26 MED ORDER — ENOXAPARIN SODIUM 60 MG/0.6ML ~~LOC~~ SOLN
0.5000 mg/kg | SUBCUTANEOUS | Status: DC
  Administered 2020-12-26: 50 mg via SUBCUTANEOUS
  Filled 2020-12-26 (×3): qty 0.6

## 2020-12-26 MED ORDER — INSULIN ASPART 100 UNIT/ML ~~LOC~~ SOLN: 0.0000 [IU] | Freq: Three times a day (TID) | SUBCUTANEOUS | Status: DC

## 2020-12-26 MED ORDER — METHIMAZOLE 10 MG PO TABS
10.0000 mg | ORAL_TABLET | Freq: Every day | ORAL | Status: DC
  Administered 2020-12-26 – 2020-12-27 (×2): 10 mg via ORAL
  Filled 2020-12-26 (×2): qty 1

## 2020-12-26 MED ORDER — INSULIN ASPART PROT & ASPART (70-30 MIX) 100 UNIT/ML ~~LOC~~ SUSP
10.0000 [IU] | Freq: Two times a day (BID) | SUBCUTANEOUS | Status: DC
  Administered 2020-12-26 – 2020-12-27 (×2): 10 [IU] via SUBCUTANEOUS
  Filled 2020-12-26 (×2): qty 10

## 2020-12-26 MED ORDER — MORPHINE SULFATE (PF) 2 MG/ML IV SOLN
INTRAVENOUS | Status: AC
  Administered 2020-12-26: 2 mg via INTRAVENOUS
  Filled 2020-12-26: qty 1

## 2020-12-26 MED ORDER — BUPROPION HCL ER (SR) 150 MG PO TB12
150.0000 mg | ORAL_TABLET | Freq: Two times a day (BID) | ORAL | Status: DC
  Administered 2020-12-26 – 2020-12-27 (×2): 150 mg via ORAL
  Filled 2020-12-26 (×4): qty 1

## 2020-12-26 MED ORDER — DUTASTERIDE 0.5 MG PO CAPS
0.5000 mg | ORAL_CAPSULE | Freq: Every day | ORAL | Status: DC
  Administered 2020-12-27: 0.5 mg via ORAL
  Filled 2020-12-26: qty 1

## 2020-12-26 MED ORDER — ACETAMINOPHEN 325 MG PO TABS: 650.0000 mg | ORAL_TABLET | Freq: Four times a day (QID) | ORAL | Status: DC | PRN

## 2020-12-26 MED ORDER — ONDANSETRON HCL 4 MG/2ML IJ SOLN: 4.0000 mg | Freq: Four times a day (QID) | INTRAMUSCULAR | Status: DC | PRN

## 2020-12-26 MED ORDER — MORPHINE SULFATE (PF) 2 MG/ML IV SOLN: 2.0000 mg | Freq: Once | INTRAVENOUS | Status: AC

## 2020-12-26 MED ORDER — SERTRALINE HCL 50 MG PO TABS
100.0000 mg | ORAL_TABLET | Freq: Every day | ORAL | Status: DC
  Administered 2020-12-26 – 2020-12-27 (×2): 100 mg via ORAL
  Filled 2020-12-26 (×2): qty 2

## 2020-12-26 MED ORDER — LACTATED RINGERS IV SOLN: INTRAVENOUS | Status: AC

## 2020-12-26 MED ORDER — AMIODARONE HCL 200 MG PO TABS
200.0000 mg | ORAL_TABLET | Freq: Every day | ORAL | Status: DC
  Administered 2020-12-27: 200 mg via ORAL
  Filled 2020-12-26: qty 1

## 2020-12-26 MED ORDER — TRAZODONE HCL 50 MG PO TABS: 50.0000 mg | ORAL_TABLET | Freq: Every evening | ORAL | Status: DC | PRN

## 2020-12-26 MED ORDER — ACETAMINOPHEN 650 MG RE SUPP: 650.0000 mg | Freq: Four times a day (QID) | RECTAL | Status: DC | PRN

## 2020-12-26 MED ORDER — PANTOPRAZOLE SODIUM 40 MG PO TBEC
40.0000 mg | DELAYED_RELEASE_TABLET | Freq: Every day | ORAL | Status: DC
  Administered 2020-12-26 – 2020-12-27 (×2): 40 mg via ORAL
  Filled 2020-12-26 (×2): qty 1

## 2020-12-26 MED ORDER — ONDANSETRON HCL 4 MG PO TABS: 4.0000 mg | ORAL_TABLET | Freq: Four times a day (QID) | ORAL | Status: DC | PRN

## 2020-12-26 MED ORDER — POLYETHYLENE GLYCOL 3350 17 G PO PACK: 17.0000 g | PACK | Freq: Every day | ORAL | Status: DC | PRN

## 2020-12-26 MED ORDER — METOPROLOL TARTRATE 25 MG PO TABS
25.0000 mg | ORAL_TABLET | Freq: Two times a day (BID) | ORAL | Status: DC
  Administered 2020-12-26 – 2020-12-27 (×3): 25 mg via ORAL
  Filled 2020-12-26 (×3): qty 1

## 2020-12-26 MED ORDER — VENLAFAXINE HCL ER 150 MG PO CP24
150.0000 mg | ORAL_CAPSULE | Freq: Every day | ORAL | Status: DC
  Administered 2020-12-27: 150 mg via ORAL
  Filled 2020-12-26: qty 2
  Filled 2020-12-26: qty 1

## 2020-12-26 NOTE — ED Notes (Signed)
Pt back from MRI 

## 2020-12-26 NOTE — ED Notes (Signed)
Transportation requested  

## 2020-12-26 NOTE — Plan of Care (Signed)
  Problem: Education: Goal: Knowledge of General Education information will improve Description: Including pain rating scale, medication(s)/side effects and non-pharmacologic comfort measures Outcome: Progressing   Problem: Health Behavior/Discharge Planning: Goal: Ability to manage health-related needs will improve Outcome: Progressing   Problem: Clinical Measurements: Goal: Will remain free from infection Outcome: Progressing   

## 2020-12-26 NOTE — ED Notes (Signed)
MD with pt  

## 2020-12-26 NOTE — ED Triage Notes (Signed)
Pt comes into the eD via POV c/o generalized weakness and decrease of balance.  Pt's MD wanted him seen due to steady decline in balance.  Pt normally able to ambulate well but has now transitioned to using a cane.  Pt denies any CP, SHOB.  Pt states he does get dizzy at times and he has trembling.  Pt ambulatory to triage and has even and unlabored respirations.  Pt in NAD>

## 2020-12-26 NOTE — H&P (Signed)
[facility] History and Physical  CAMAR GUYTON RKY:[id number] DOB: [birth year] DOA: 12/26/2020  Referring physician: Dr. Ellender Hose PCP: Jerrol Banana., MD   Chief Complaint: falls  HPI: Tom Moore is a 75 y.o. male with hx of A. fib, systolic CHF, type 2 diabetes, hypertension, hyperlipidemia, CKD, hypothyroidism, who presents from home with frequent falls.  Last seen as an outpatient on March 9 for hospital follow-up after admission at the end of February.  At that time he had been admitted for syncope which was thought to possibly be due to hypoglycemic events.  At time of discharge only on Metformin and 70/30 20u twice daily.  At that visit he was referred to neurology for tremors, physical therapy for falls, and cardiology for his heart failure.  Further review of chart shows that he follows with endocrinology at the [facility] clinic and has tried multiple medications.  Today reports that he has presented with his daughter at the suggestion of his primary care doctor due to worsening falls.  His daughter reports that he has had progressively more frequent falls over the past couple of weeks, she has also noticed multiple episodes of shaking both when active and at rest.  She has also noticed that he seems to be very unsteady on his feet.  Patient reports that he is having a mix of falls due to what sounds like syncope, as well as simply losing his balance.  When he has syncope he has no warning, and finds himself on the floor.  When he wakes up he does not have bowel or bladder incontinence but feels very weak and is unable to get up by himself for several minutes.  He otherwise denies any chest pain or shortness of breath.  His daughter reports that after his falling events they have not seen any associated shaking or bowel incontinence.  He does report relatively new numbness bilaterally from mid foot to his toes.  He reports that he has never previously had this symptoms.  In  the ED initial vital signs notable only for very mild hypertension.  CMP notable for creatinine at baseline of 1.7, also with elevation of AST at 83 and ALT 133 which is new for him, bilirubin normal.  CBC unremarkable.  Troponin borderline positive at 21, and BNP elevated at 229 with no recent priors for comparison.  EKG unchanged from prior.  An MRI was then obtained which does not show any acute findings.  At this point neurology was consulted and they recommended admission to medicine for further evaluation as well as an MRI of the cervical, thoracic, and lumbar spine.  They also recommended carotid and vertebrobasilar imaging with CT angio.  Review of Systems:  Pertinent positives and negative per HPI, all others reviewed and negative  Past Medical History:  Diagnosis Date  . Anxiety   . Arthritis   . CHF (congestive heart failure) ([place])   . Depression   . Diabetes mellitus without complication ([place])   . Dysrhythmia    atrial fib  . GERD (gastroesophageal reflux disease)   . Hyperlipidemia   . Hypertension   . Hyperthyroidism   . Insomnia   . Neuromuscular disorder ([place])    diabetic neuropathy  . Orthopnea    uses extra pillow to sleep  . Sleep apnea    unable to tolerater CPAP   Past Surgical History:  Procedure Laterality Date  . CARDIAC CATHETERIZATION    . CARPAL TUNNEL RELEASE Right 04/10/2018  Procedure: RELEASE MEDIAN NERVE AT CARPAL TUNNEL;  Surgeon: Earnestine Leys, MD;  Location: [facility] ORS;  Service: Orthopedics;  Laterality: Right;  . CARPAL TUNNEL RELEASE Left 05/08/2018   Procedure: CARPAL TUNNEL RELEASE;  Surgeon: Earnestine Leys, MD;  Location: [facility] ORS;  Service: Orthopedics;  Laterality: Left;  . CATARACT EXTRACTION W/PHACO Right 08/16/2019   Procedure: CATARACT EXTRACTION PHACO AND INTRAOCULAR LENS PLACEMENT ([place]) RIGHT;  Surgeon: Marchia Meiers, MD;  Location: [facility] ORS;  Service: Ophthalmology;  Laterality: Right;  [place] 01:30.3 CDE 13.71 Fluid Pack lot # [id number]   . COLONOSCOPY WITH PROPOFOL N/A 06/04/2020   Procedure: COLONOSCOPY WITH PROPOFOL;  Surgeon: Virgel Manifold, MD;  Location: [facility] ENDOSCOPY;  Service: Endoscopy;  Laterality: N/A;  . ESOPHAGOGASTRODUODENOSCOPY (EGD) WITH PROPOFOL N/A 06/04/2020   Procedure: ESOPHAGOGASTRODUODENOSCOPY (EGD) WITH PROPOFOL;  Surgeon: Virgel Manifold, MD;  Location: [facility] ENDOSCOPY;  Service: Endoscopy;  Laterality: N/A;  . TONSILLECTOMY     Social History:  reports that he quit smoking about 32 years ago. He has never used smokeless tobacco. He reports previous alcohol use. He reports that he does not use drugs.  Allergies  Allergen Reactions  . Zyrtec Allergy [Cetirizine] Diarrhea    Family History  Problem Relation Age of Onset  . Hypertension Mother   . Heart disease Mother      Prior to Admission medications   Medication Sig Start Date End Date Taking? Authorizing Provider  atorvastatin (LIPITOR) 40 MG tablet Take 40 mg by mouth daily.    [provider]  buPROPion (WELLBUTRIN SR) 150 MG 12 hr tablet Take 1 tablet (150 mg total) by mouth 2 (two) times daily. 10/22/20   Jerrol Banana., MD  diltiazem (CARDIZEM CD) 120 MG 24 hr capsule Hold until followup with your outpatient doctor due to low blood pressure. Patient not taking: Reported on 12/03/2020 11/20/20   Enzo Bi, MD  dutasteride (AVODART) 0.5 MG capsule Take 1 capsule by mouth once daily 11/16/20   Jerrol Banana., MD  furosemide (LASIX) 40 MG tablet Hold until outpatient followup with your doctor due to low blood pressure. Patient not taking: Reported on 12/03/2020 11/20/20   Enzo Bi, MD  glucose blood test strip Use 3 (three) times daily One touch ultra test strips e11.29 06/02/18   [provider]  insulin NPH-regular Human (70-30) 100 UNIT/ML injection Inject 20 Units into the skin 2 (two) times daily with a meal. 11/20/20   Enzo Bi, MD  meclizine (ANTIVERT) 25 MG tablet TAKE 1 TABLET BY MOUTH THREE TIMES  DAILY AS NEEDED FOR  DIZZINESS Patient not taking: No sig reported 07/16/20   Jerrol Banana., MD  metFORMIN (GLUCOPHAGE) 1000 MG tablet TAKE 1 TABLET BY MOUTH TWICE DAILY WITH A MEAL Patient taking differently: daily with breakfast. 04/27/16   Carmon Ginsberg, PA  methimazole (TAPAZOLE) 5 MG tablet Take 5 mg by mouth. 02/15/17   [provider]  metoprolol tartrate (LOPRESSOR) 25 MG tablet Take 25 mg by mouth 2 (two) times daily.    [provider]  omeprazole (PRILOSEC) 40 MG capsule Take 1 capsule by mouth once daily 11/17/20   Jerrol Banana., MD  PACERONE 200 MG tablet Take by mouth. 06/19/20   [provider]  sacubitril-valsartan (ENTRESTO) 24-26 MG Take by mouth. 06/03/20   [provider]  sertraline (ZOLOFT) 100 MG tablet Take 1 tablet by mouth once daily Patient taking differently: Take 100 mg by mouth daily. 09/26/20  Jerrol Banana., MD  sucralfate (CARAFATE) 1 g tablet TAKE 1 TABLET BY MOUTH THREE TIMES DAILY WITH MEALS 11/16/20   Jerrol Banana., MD  tamsulosin (FLOMAX) 0.4 MG CAPS capsule TAKE 2 CAPSULES BY MOUTH ONCE DAILY WITH LUNCH Patient taking differently: Take 0.8 mg by mouth daily with lunch. 07/16/20   Stoioff, Ronda Fairly, MD  venlafaxine XR (EFFEXOR-XR) 150 MG 24 hr capsule TAKE 1 CAPSULE BY MOUTH ONCE DAILY WITH BREAKFAST Patient taking differently: Take 150 mg by mouth daily with breakfast. 08/29/20   Jerrol Banana., MD   Physical Exam: Vitals:   12/26/20 1000 12/26/20 1035 12/26/20 1100 12/26/20 1130  BP: 138/89 133/89 (!) 143/85 132/87  Pulse: 71 71 70 71  Resp: 16 16 18 15   Temp:      TempSrc:      SpO2: 100% 97% 99% 96%  Weight:      Height:        Wt Readings from Last 3 Encounters:  12/26/20 99.8 kg  12/03/20 101.6 kg  11/18/20 103.9 kg     . General:  Appears calm and comfortable . Eyes: PERRL, normal lids, irises & conjunctiva . ENT: grossly normal hearing, lips & tongue . Neck: no  masses . Cardiovascular: RRR, no m/r/g. No LE edema. . Telemetry: SR, no arrhythmias  . Respiratory: CTA bilaterally, no w/r/r. Normal respiratory effort. . Abdomen: soft, ntnd . Skin: no rash or induration seen on limited exam . Musculoskeletal: grossly normal tone BUE/BLE . Psychiatric: grossly normal mood and affect, speech fluent and appropriate . Neurologic: grossly non-focal.          Labs on Admission:  Basic Metabolic Panel: Recent Labs  Lab 12/26/20 0927  NA 142  K 3.8  CL 105  CO2 26  GLUCOSE 125*  BUN 22  CREATININE 1.73*  CALCIUM 9.3  MG 1.7   Liver Function Tests: Recent Labs  Lab 12/26/20 0927  AST 83*  ALT 133*  ALKPHOS 95  BILITOT 1.1  PROT 7.4  ALBUMIN 4.1   No results for input(s): LIPASE, AMYLASE in the last 168 hours. No results for input(s): AMMONIA in the last 168 hours. CBC: Recent Labs  Lab 12/26/20 0927  WBC 5.7  NEUTROABS 3.7  HGB 14.7  HCT 43.4  MCV 90.2  PLT 180   Cardiac Enzymes: No results for input(s): CKTOTAL, CKMB, CKMBINDEX, TROPONINI in the last 168 hours.  BNP (last 3 results) Recent Labs    12/26/20 0927  BNP 229.9*    ProBNP (last 3 results) No results for input(s): PROBNP in the last 8760 hours.  CBG: No results for input(s): GLUCAP in the last 168 hours.  Radiological Exams on Admission: MR BRAIN WO CONTRAST  Result Date: 12/26/2020 CLINICAL DATA:  Vertigo EXAM: MRI HEAD WITHOUT CONTRAST TECHNIQUE: Multiplanar, multiecho pulse sequences of the brain and surrounding structures were obtained without intravenous contrast. COMPARISON:  None. FINDINGS: Brain: No acute infarction, hemorrhage, or hydrocephalus. No intraparenchymal mass lesion. Moderate scattered T2/FLAIR hyperintensities within the white matter, nonspecific but most likely related to chronic microvascular ischemic disease given patient age. Moderate generalized cerebral atrophy. Along the vertex posteriorly there is prominence of the extra-axial  spaces with mild mass effect on the adjacent parietal lobes (series 10, image 20), suspected to represent small arachnoid cysts. Additionally there is mild prominence of the retro cerebellar CSF on the right, likely mega cisterna magna or an additional small right arachnoid cyst. No midline shift. Basal cisterns  are patent. Vascular: Major arterial flow voids are maintained at the skull base. Non dominant right vertebral artery and tortuous vertebrobasilar system. Skull and upper cervical spine: Normal marrow signal. Sinuses/Orbits: Mild ethmoid air cell and right frontal sinus mucosal thickening. No air-fluid levels. Unremarkable orbits. Right lens extraction. Other: No mastoid effusions. IMPRESSION: 1. No evidence of acute intracranial abnormality. Specifically, no acute infarct. 2. Moderate chronic microvascular ischemic disease and generalized cerebral atrophy. 3. Suspected high biparietal convexity arachnoid cysts with mild mass effect, unlikely to be of clinical significance. Electronically Signed   By: Margaretha Sheffield MD   On: 12/26/2020 10:53    EKG: Independently reviewed. Incomplete LBBB, no acute ischemic changes, unchanged from prior.   Assessment/Plan Active Problems:   Depression   Mixed hyperlipidemia   Essential hypertension   Type 2 diabetes mellitus without complication, with long-term current use of insulin (HCC)   GERD without esophagitis   Heart failure with reduced ejection fraction (HCC)   AF (paroxysmal atrial fibrillation) (HCC)   Subclinical hyperthyroidism   CKD stage 3 due to type 2 diabetes mellitus ([place])   Frequent falls   #Frequent falls #Poor balance #Syncope #Distal neuropathy Unclear etiology for patient's constellation of symptoms.  Sudden syncope raises concern for possible cardiac etiology, however remainder symptoms appear to be neurological in origin.  No strong history to suggest seizures.  Some elements of history suggestive of B12 deficiency, will  check level, last done in 2018 was low normal at 488.  Initial MRI unrevealing, CTA carotids as well as an MRI of spinal column is still pending.  Other elements concerning for neurodegenerative disease, possibly parkinsonian, especially given daughter's report of memory difficulties, falls, and tremors. -Follow-up neurology recommendations -Follow-up CTA and MRI results -Telemetry -Check orthostatics -check B12, MMA, folate  #Elevated troponin No chest pain, shortness of breath, or signs of CHF on exam.  EKG nonischemic and unchanged from prior.  Likely from mild demand. -Trend troponin  #Transaminitis Unclear etiology, possibly due to mild dehydration or may be related to underlying process leading to his other symptoms. -Check acute hepatitis panel, low suspicion -Trend CMP  #DM2 Has been having frequent hypoglycemic episodes at home and his regimen has been down trended significantly over the past several months. -Reduce 70/30 insulin from 20 to 10 units twice daily -Hold metformin  #Known medical problems Hyperlipidemia-continue atorvastatin.  Anxiety/depression-continue Wellbutrin, sertraline, venlafaxine BPH-continue dutasteride, Flomax recently discontinued by PCP Per outpatient notes Hyperthyroidism-continue methimazole, recently increased from 5 to 10 mg by endocrinology per outpatient notes Hypertension-continue metoprolol.  Entresto recently discontinued by PCP GERD-continue PPI, sucralfate A. fib-continue amiodarone    Code Status: Full Code DVT Prophylaxis: Lovenox Family Communication: daughter updated at bedside Disposition Plan: Inpatient, Med-Surg   Time spent: 72 min  Clarnce Flock MD/MPH [facility]  Note:  This document was prepared using Systems analyst and may include unintentional dictation errors.

## 2020-12-26 NOTE — ED Notes (Signed)
Per CT, pt is moving too much due to shoulder/arm pain. MD Dione Plover messaged- PRN requested- awaiting response.

## 2020-12-26 NOTE — ED Provider Notes (Signed)
[facility] Emergency Department Provider Note  ____________________________________________   Event Date/Time   First MD Initiated Contact with Patient 12/26/20 [phone]     (approximate)  I have reviewed the triage vital signs and the nursing notes.   HISTORY  Chief Complaint Weakness    HPI Tom Moore is a 75 y.o. male with history of hypertension, hyperlipidemia, diabetes, CHF, A. fib, here with dizziness and loss of balance.  The patient states that he has had progressively worsening loss of balance for the last several months.  Over the last 3 weeks, he is gone from being essentially independent and ambulatory to using a cane and now having 7 falls over the last several days.  He said extreme dizziness, loss of balance, and feels like he is very unsteady on his feet.  He states he also has tremor which seems to happen both at rest as well as when he tries to exert himself.  Denies any focal numbness or weakness.  No seizure-like activity and he is awake during his tremors.  Denies any fevers or chills.  No chest pain.  No recent medication changes.  He was told to come to the ER for evaluation by his PCP.  He currently has an appointment with neurology scheduled in 18 days as an outpatient.  He has not seen neurology prior to this.  No other complaints.        Past Medical History:  Diagnosis Date  . Anxiety   . Arthritis   . CHF (congestive heart failure) ([place])   . Depression   . Diabetes mellitus without complication ([place])   . Dysrhythmia    atrial fib  . GERD (gastroesophageal reflux disease)   . Hyperlipidemia   . Hypertension   . Hyperthyroidism   . Insomnia   . Neuromuscular disorder ([place])    diabetic neuropathy  . Orthopnea    uses extra pillow to sleep  . Sleep apnea    unable to tolerater CPAP    Patient Active Problem List   Diagnosis Date Noted  . Chronic systolic CHF (congestive heart failure) ([place]) 11/05/2020  . Syncope  11/04/2020  . Chronic kidney disease, stage 3b ([place]) 11/04/2020  . Benign prostatic hyperplasia with lower urinary tract symptoms 08/27/2020  . Screening for colon cancer   . Polyp of colon   . Early satiety   . Non-intractable vomiting   . Stasis dermatitis of both legs 06/09/2018  . Bilateral carpal tunnel syndrome 11/25/2017  . Nocturia 08/17/2017  . CKD stage 3 due to type 2 diabetes mellitus ([place]) 05/05/2017  . Heart failure with reduced ejection fraction ([place]) 11/30/2016  . AF (paroxysmal atrial fibrillation) ([place]) 11/30/2016  . Subclinical hyperthyroidism 11/30/2016  . Insomnia 05/20/2015  . Depression 05/20/2015  . Mixed hyperlipidemia 05/20/2015  . Anxiety 05/20/2015  . Erectile dysfunction 05/20/2015  . Essential hypertension 05/20/2015  . Type 2 diabetes mellitus without complication, with long-term current use of insulin ([place]) 05/20/2015  . GERD without esophagitis 05/20/2015  . Osteoarthritis, knee 05/20/2015    Past Surgical History:  Procedure Laterality Date  . CARDIAC CATHETERIZATION    . CARPAL TUNNEL RELEASE Right 04/10/2018   Procedure: RELEASE MEDIAN NERVE AT CARPAL TUNNEL;  Surgeon: Earnestine Leys, MD;  Location: [facility] ORS;  Service: Orthopedics;  Laterality: Right;  . CARPAL TUNNEL RELEASE Left 05/08/2018   Procedure: CARPAL TUNNEL RELEASE;  Surgeon: Earnestine Leys, MD;  Location: [facility] ORS;  Service: Orthopedics;  Laterality: Left;  .  CATARACT EXTRACTION W/PHACO Right 08/16/2019   Procedure: CATARACT EXTRACTION PHACO AND INTRAOCULAR LENS PLACEMENT ([place]) RIGHT;  Surgeon: Marchia Meiers, MD;  Location: [facility] ORS;  Service: Ophthalmology;  Laterality: Right;  [place] 01:30.3 CDE 13.71 Fluid Pack lot # [id number]  . COLONOSCOPY WITH PROPOFOL N/A 06/04/2020   Procedure: COLONOSCOPY WITH PROPOFOL;  Surgeon: Virgel Manifold, MD;  Location: [facility] ENDOSCOPY;  Service: Endoscopy;  Laterality: N/A;  . ESOPHAGOGASTRODUODENOSCOPY (EGD) WITH PROPOFOL N/A 06/04/2020   Procedure:  ESOPHAGOGASTRODUODENOSCOPY (EGD) WITH PROPOFOL;  Surgeon: Virgel Manifold, MD;  Location: [facility] ENDOSCOPY;  Service: Endoscopy;  Laterality: N/A;  . TONSILLECTOMY      Prior to Admission medications   Medication Sig Start Date End Date Taking? Authorizing Provider  atorvastatin (LIPITOR) 40 MG tablet Take 40 mg by mouth daily.    [provider]  buPROPion (WELLBUTRIN SR) 150 MG 12 hr tablet Take 1 tablet (150 mg total) by mouth 2 (two) times daily. 10/22/20   Jerrol Banana., MD  diltiazem (CARDIZEM CD) 120 MG 24 hr capsule Hold until followup with your outpatient doctor due to low blood pressure. Patient not taking: Reported on 12/03/2020 11/20/20   Enzo Bi, MD  dutasteride (AVODART) 0.5 MG capsule Take 1 capsule by mouth once daily 11/16/20   Jerrol Banana., MD  furosemide (LASIX) 40 MG tablet Hold until outpatient followup with your doctor due to low blood pressure. Patient not taking: Reported on 12/03/2020 11/20/20   Enzo Bi, MD  glucose blood test strip Use 3 (three) times daily One touch ultra test strips e11.29 06/02/18   [provider]  insulin NPH-regular Human (70-30) 100 UNIT/ML injection Inject 20 Units into the skin 2 (two) times daily with a meal. 11/20/20   Enzo Bi, MD  meclizine (ANTIVERT) 25 MG tablet TAKE 1 TABLET BY MOUTH THREE TIMES DAILY AS NEEDED FOR  DIZZINESS Patient not taking: No sig reported 07/16/20   Jerrol Banana., MD  metFORMIN (GLUCOPHAGE) 1000 MG tablet TAKE 1 TABLET BY MOUTH TWICE DAILY WITH A MEAL Patient taking differently: daily with breakfast. 04/27/16   Carmon Ginsberg, PA  methimazole (TAPAZOLE) 5 MG tablet Take 5 mg by mouth. 02/15/17   [provider]  metoprolol tartrate (LOPRESSOR) 25 MG tablet Take 25 mg by mouth 2 (two) times daily.    [provider]  omeprazole (PRILOSEC) 40 MG capsule Take 1 capsule by mouth once daily 11/17/20   Jerrol Banana., MD  PACERONE 200 MG tablet Take by  mouth. 06/19/20   [provider]  sacubitril-valsartan (ENTRESTO) 24-26 MG Take by mouth. 06/03/20   [provider]  sertraline (ZOLOFT) 100 MG tablet Take 1 tablet by mouth once daily Patient taking differently: Take 100 mg by mouth daily. 09/26/20   Jerrol Banana., MD  sucralfate (CARAFATE) 1 g tablet TAKE 1 TABLET BY MOUTH THREE TIMES DAILY WITH MEALS 11/16/20   Jerrol Banana., MD  tamsulosin (FLOMAX) 0.4 MG CAPS capsule TAKE 2 CAPSULES BY MOUTH ONCE DAILY WITH LUNCH Patient taking differently: Take 0.8 mg by mouth daily with lunch. 07/16/20   Stoioff, Ronda Fairly, MD  venlafaxine XR (EFFEXOR-XR) 150 MG 24 hr capsule TAKE 1 CAPSULE BY MOUTH ONCE DAILY WITH BREAKFAST Patient taking differently: Take 150 mg by mouth daily with breakfast. 08/29/20   Jerrol Banana., MD    Allergies Zyrtec allergy [cetirizine]  Family History  Problem Relation Age of Onset  . Hypertension  Mother   . Heart disease Mother     Social History Social History   Tobacco Use  . Smoking status: Former Smoker    Quit date: 04/03/1988    Years since quitting: 32.7  . Smokeless tobacco: Never Used  Vaping Use  . Vaping Use: Never used  Substance Use Topics  . Alcohol use: Not Currently    Comment: Ocassional alcohol use, Drinks wine.  . Drug use: Never    Review of Systems  Review of Systems  Constitutional: Positive for fatigue. Negative for chills and fever.  HENT: Negative for sore throat.   Respiratory: Negative for shortness of breath.   Cardiovascular: Negative for chest pain.  Gastrointestinal: Negative for abdominal pain.  Genitourinary: Negative for flank pain.  Musculoskeletal: Positive for gait problem. Negative for neck pain.  Skin: Negative for rash and wound.  Allergic/Immunologic: Negative for immunocompromised state.  Neurological: Positive for dizziness, tremors and weakness. Negative for numbness.  Hematological: Does not bruise/bleed easily.  All  other systems reviewed and are negative.    ____________________________________________  PHYSICAL EXAM:      VITAL SIGNS: ED Triage Vitals  Enc Vitals Group     BP 12/26/20 0849 (!) 148/96     Pulse Rate 12/26/20 0849 87     Resp 12/26/20 0849 18     Temp 12/26/20 0849 97.6 F (36.4 C)     Temp Source 12/26/20 0849 Oral     SpO2 12/26/20 0849 99 %     Weight 12/26/20 0845 220 lb (99.8 kg)     Height 12/26/20 0845 5\' 9"  (1.753 m)     Head Circumference --      Peak Flow --      Pain Score 12/26/20 0845 0     Pain Loc --      Pain Edu? --      Excl. in [place]? --      Physical Exam Vitals and nursing note reviewed.  Constitutional:      General: He is not in acute distress.    Appearance: He is well-developed.  HENT:     Head: Normocephalic and atraumatic.     Nose: Nose normal.  Eyes:     Conjunctiva/sclera: Conjunctivae normal.  Cardiovascular:     Rate and Rhythm: Normal rate and regular rhythm.     Heart sounds: Normal heart sounds.  Pulmonary:     Effort: Pulmonary effort is normal. No respiratory distress.     Breath sounds: No wheezing.  Abdominal:     General: There is no distension.  Musculoskeletal:     Cervical back: Neck supple.  Skin:    General: Skin is warm.     Capillary Refill: Capillary refill takes less than 2 seconds.     Findings: No rash.  Neurological:     Mental Status: He is alert and oriented to person, place, and time.     Motor: No abnormal muscle tone.     Comments: Neurological Exam:  Mental Status: Alert and oriented to person, place, and time. Attention and concentration normal. Speech clear. Recent memory is intact. Cranial Nerves: Visual fields grossly intact. EOMI and PERRLA. No nystagmus noted. Facial sensation intact at forehead, maxillary cheek, and chin/mandible bilaterally. No facial asymmetry or weakness. Hearing grossly normal. Uvula is midline, and palate elevates symmetrically. Normal SCM and trapezius strength. Tongue  midline without fasciculations. Motor: Muscle strength 5/5 in proximal and distal UE and LE bilaterally. No pronator drift. Muscle tone normal.  Sensation: Intact to light touch in upper and lower extremities distally bilaterally.  Gait: Deferred Coordination: Tremor and dysmetria noted on FTN bl, L>R          ____________________________________________   LABS (all labs ordered are listed, but only abnormal results are displayed)  Labs Reviewed  COMPREHENSIVE METABOLIC PANEL - Abnormal; Notable for the following components:      Result Value   Glucose, Bld 125 (*)    Creatinine, Ser 1.73 (*)    AST 83 (*)    ALT 133 (*)    GFR, Estimated 41 (*)    All other components within normal limits  BRAIN NATRIURETIC PEPTIDE - Abnormal; Notable for the following components:   B Natriuretic Peptide 229.9 (*)    All other components within normal limits  TROPONIN I (HIGH SENSITIVITY) - Abnormal; Notable for the following components:   Troponin I (High Sensitivity) 21 (*)    All other components within normal limits  RESP PANEL BY RT-PCR (FLU A&B, COVID) ARPGX2  CBC WITH DIFFERENTIAL/PLATELET  MAGNESIUM  TSH    ____________________________________________  EKG: Normal sinus rhythm, ventricular rate 74.  PR 186, QRS 117, QTc 43.  No acute ST elevations or depressions. ________________________________________  RADIOLOGY All imaging, including plain films, CT scans, and ultrasounds, independently reviewed by me, and interpretations confirmed via formal radiology reads.  ED MD interpretation:   MRI brain: No acute abnormality  Official radiology report(s): MR BRAIN WO CONTRAST  Result Date: 12/26/2020 CLINICAL DATA:  Vertigo EXAM: MRI HEAD WITHOUT CONTRAST TECHNIQUE: Multiplanar, multiecho pulse sequences of the brain and surrounding structures were obtained without intravenous contrast. COMPARISON:  None. FINDINGS: Brain: No acute infarction, hemorrhage, or hydrocephalus. No  intraparenchymal mass lesion. Moderate scattered T2/FLAIR hyperintensities within the white matter, nonspecific but most likely related to chronic microvascular ischemic disease given patient age. Moderate generalized cerebral atrophy. Along the vertex posteriorly there is prominence of the extra-axial spaces with mild mass effect on the adjacent parietal lobes (series 10, image 20), suspected to represent small arachnoid cysts. Additionally there is mild prominence of the retro cerebellar CSF on the right, likely mega cisterna magna or an additional small right arachnoid cyst. No midline shift. Basal cisterns are patent. Vascular: Major arterial flow voids are maintained at the skull base. Non dominant right vertebral artery and tortuous vertebrobasilar system. Skull and upper cervical spine: Normal marrow signal. Sinuses/Orbits: Mild ethmoid air cell and right frontal sinus mucosal thickening. No air-fluid levels. Unremarkable orbits. Right lens extraction. Other: No mastoid effusions. IMPRESSION: 1. No evidence of acute intracranial abnormality. Specifically, no acute infarct. 2. Moderate chronic microvascular ischemic disease and generalized cerebral atrophy. 3. Suspected high biparietal convexity arachnoid cysts with mild mass effect, unlikely to be of clinical significance. Electronically Signed   By: Margaretha Sheffield MD   On: 12/26/2020 10:53    ____________________________________________  PROCEDURES   Procedure(s) performed (including Critical Care):  .1-3 Lead EKG Interpretation Performed by: Duffy Bruce, MD Authorized by: Duffy Bruce, MD     Interpretation: normal     ECG rate:  70-90   ECG rate assessment: normal     Rhythm: sinus rhythm     Ectopy: none     Conduction: normal   Comments:     Indication: Stroke like symptoms    ____________________________________________  INITIAL IMPRESSION / MDM / ASSESSMENT AND PLAN / ED COURSE  As part of my medical decision  making, I reviewed the following data within the [facility]  Nursing notes reviewed and incorporated, Old chart reviewed, Notes from prior ED visits, and [place] Controlled Substance Database       *Dajaun Goldring Chiarelli was evaluated in Emergency Department on 12/26/2020 for the symptoms described in the history of present illness. He was evaluated in the context of the global COVID-19 pandemic, which necessitated consideration that the patient might be at risk for infection with the SARS-CoV-2 virus that causes COVID-19. Institutional protocols and algorithms that pertain to the evaluation of patients at risk for COVID-19 are in a state of rapid change based on information released by regulatory bodies including the CDC and federal and state organizations. These policies and algorithms were followed during the patient's care in the ED.  Some ED evaluations and interventions may be delayed as a result of limited staffing during the pandemic.*     Medical Decision Making: 75 year old male here with inability to ambulate and loss of balance.  Patient has had 7 falls in the last week.  On exam, he has dysmetria and some left hip flexor weakness.  MRI obtained and shows no evidence of acute abnormality.  Screening lab work is overall unremarkable.  He has a mild troponin and BNP elevation which I suspect is due to underlying hypertensive and coronary atherosclerosis.  CMP shows moderate CKD.  Will give cautious fluids.  Case was discussed with neurology who has independently evaluated the patient.  They recommend admission for MRI of the C, T, and L-spine, syncope evaluation, as well as carotid imaging and vertebrobasilar imaging with CT angio.  Will admit to medicine.  ____________________________________________  FINAL CLINICAL IMPRESSION(S) / ED DIAGNOSES  Final diagnoses:  Gait instability     MEDICATIONS GIVEN DURING THIS VISIT:  Medications - No data to display   ED Discharge Orders     None       Note:  This document was prepared using Dragon voice recognition software and may include unintentional dictation errors.   Duffy Bruce, MD 12/26/20 1200

## 2020-12-26 NOTE — ED Notes (Signed)
Called to give heads up to floor- per charge, nurse is on break and they will not accept pt until she is back.

## 2020-12-26 NOTE — Consult Note (Signed)
NEUROLOGY CONSULTATION NOTE   Date of service: December 26, 2020 Patient Name: Tom Moore MRN:  [id number] DOB:  07/28/1946 Reason for consult: frequent falls _ _ _   _ __   _ __ _ _  __ __   _ __   __ _  History of Present Illness   This is a 75 year old gentleman with a history of hypertension, hyperlipidemia, diabetes, CHF, A. fib who presents to the ED with complaints of imbalance and frequent falls.  He has had progressive worsening of his balance for the last several several months.  He denies frank dizziness and does not report any weakness in any of his extremities.  He has developed intermittent episodes of low amplitude shaking of his arms and legs simultaneously.  This is not associated with any alteration consciousness.  This may occur while he is laying down, seated, or standing.  If standing is does not precipitate a fall.  It was also witnessed by his daughter once while he was sleeping.  Over the past 3 weeks due to his loss of balance he has began falling frequently.  He has had 7 falls over the last several days.  Some of these he describes as frank syncope where he passes out and then falls as result, although he has also had falls due to his loss of balance.  He denies any focal numbness or weakness.  There is no positional component to his syncope.  He was seen in the ED for this recently and did undergo an echo however has not undergone cardiac monitoring as an outpatient.  No signs or symptoms of infection.  No fevers or chills.  No chest pain or palpitations.  No recent medication changes.  He has not seen a neurologist to date although he does have an appointment with 1 in 18 days from now.  Due to his imbalance and also reported dysmetria on finger-nose-finger he underwent MRI brain in the emergency department without contrast.  Upon personal review he does have atrophy advanced for age predominantly in the frontotemporal lobes bilaterally however he has no acute or chronic  infarcts.  He does report a head injury with one of his falls and is not sure if he passed out but he does not have memory of directly after the event.  No prodrome to episodes of syncope. No changes in bowel or bladder function.    ROS   10 point review of systems was performed and was negative except as described in HPI.  Past History   Past Medical History:  Diagnosis Date  . Anxiety   . Arthritis   . CHF (congestive heart failure) ([place])   . Depression   . Diabetes mellitus without complication ([place])   . Dysrhythmia    atrial fib  . GERD (gastroesophageal reflux disease)   . Hyperlipidemia   . Hypertension   . Hyperthyroidism   . Insomnia   . Neuromuscular disorder ([place])    diabetic neuropathy  . Orthopnea    uses extra pillow to sleep  . Sleep apnea    unable to tolerater CPAP   Past Surgical History:  Procedure Laterality Date  . CARDIAC CATHETERIZATION    . CARPAL TUNNEL RELEASE Right 04/10/2018   Procedure: RELEASE MEDIAN NERVE AT CARPAL TUNNEL;  Surgeon: Earnestine Leys, MD;  Location: [facility] ORS;  Service: Orthopedics;  Laterality: Right;  . CARPAL TUNNEL RELEASE Left 05/08/2018   Procedure: CARPAL TUNNEL RELEASE;  Surgeon: Earnestine Leys, MD;  Location:  [facility] ORS;  Service: Orthopedics;  Laterality: Left;  . CATARACT EXTRACTION W/PHACO Right 08/16/2019   Procedure: CATARACT EXTRACTION PHACO AND INTRAOCULAR LENS PLACEMENT ([place]) RIGHT;  Surgeon: Marchia Meiers, MD;  Location: [facility] ORS;  Service: Ophthalmology;  Laterality: Right;  [place] 01:30.3 CDE 13.71 Fluid Pack lot # [id number]  . COLONOSCOPY WITH PROPOFOL N/A 06/04/2020   Procedure: COLONOSCOPY WITH PROPOFOL;  Surgeon: Virgel Manifold, MD;  Location: [facility];  Service: Endoscopy;  Laterality: N/A;  . ESOPHAGOGASTRODUODENOSCOPY (EGD) WITH PROPOFOL N/A 06/04/2020   Procedure: ESOPHAGOGASTRODUODENOSCOPY (EGD) WITH PROPOFOL;  Surgeon: Virgel Manifold, MD;  Location: [facility] ENDOS[facility];  Service: Endoscopy;   Laterality: N/A;  . TONSILLECTOMY     Family History  Problem Relation Age of Onset  . Hypertension Mother   . Heart disease Mother    Social History   Socioeconomic History  . Marital status: Divorced    Spouse name: Not on file  . Number of children: Not on file  . Years of education: Not on file  . Highest education level: Not on file  Occupational History  . Not on file  Tobacco Use  . Smoking status: Former Smoker    Quit date: 04/03/1988    Years since quitting: 32.7  . Smokeless tobacco: Never Used  Vaping Use  . Vaping Use: Never used  Substance and Sexual Activity  . Alcohol use: Not Currently    Comment: Ocassional alcohol use, Drinks wine.  . Drug use: Never  . Sexual activity: Not Currently  Other Topics Concern  . Not on file  Social History Narrative  . Not on file   Social Determinants of Health   Financial Resource Strain: Not on file  Food Insecurity: Not on file  Transportation Needs: Not on file  Physical Activity: Not on file  Stress: Not on file  Social Connections: Not on file   Allergies  Allergen Reactions  . Zyrtec Allergy [Cetirizine] Diarrhea    Medications   (Not in a hospital admission)    Vitals   Vitals:   12/26/20 1000 12/26/20 1035 12/26/20 1100 12/26/20 1130  BP: 138/89 133/89 (!) 143/85 132/87  Pulse: 71 71 70 71  Resp: 16 16 18 15   Temp:      TempSrc:      SpO2: 100% 97% 99% 96%  Weight:      Height:         Body mass index is 32.49 kg/m.  Physical Exam   Physical Exam Gen: A&O x4, NAD HEENT: Atraumatic, normocephalic;mucous membranes moist; oropharynx clear, tongue without atrophy or fasciculations. Neck: Supple, trachea midline. Resp: CTAB, no w/r/r CV: RRR, no m/g/r; nml S1 and S2. 2+ symmetric peripheral pulses. Abd: soft/NT/ND; nabs x 4 quad Extrem: Nml bulk; no cyanosis, clubbing, or edema. Back: no pain on palpation of total spine  Neuro: *MS: A&O x4. Follows multi-step commands.  *Speech:  fluid, nondysarthric. Naming and repetition intact.  *CN:    I: Deferred   II,III: PERRLA, VFF by confrontation, optic discs sharp   III,IV,VI: EOMI w/o nystagmus, no ptosis   V: Sensation intact from V1 to V3 to LT   VII: Eyelid closure was full.  Smile symmetric.   VIII: Hearing intact to voice   IX,X: Voice normal, palate elevates symmetrically    XI: SCM/trap 5/5 bilat   XII: Tongue protrudes midline, no atrophy or fasciculations   *Motor:   Normal bulk.  No tremor, rigidity or bradykinesia. No pronator drift.    Strength:  Dlt Bic Tri WrE WrF FgS Gr HF KnF KnE PlF DoF    Left 5 5 5 5 5 5 5 4 5 4 5 5     Right 5 5 5 5 5 5 5 5 5 5 5 5     *Sensory: Length-dependent impairment to PP and vibration in BLE to the knee. Reproducible sensory level at T4. *Coordination:  Minimal ataxia and mild dysmetria on FNF bilat *Reflexes:  2+ and symmetric throughout without clonus; toes down-going bilat *Gait: minimal sensory ataxia, decreased step height and stride length, cautious, symmetric   Labs   CBC:  Recent Labs  Lab 12/26/20 0927  WBC 5.7  NEUTROABS 3.7  HGB 14.7  HCT 43.4  MCV 90.2  PLT 258    Basic Metabolic Panel:  Lab Results  Component Value Date   NA 142 12/26/2020   K 3.8 12/26/2020   CO2 26 12/26/2020   GLUCOSE 125 (H) 12/26/2020   BUN 22 12/26/2020   CREATININE 1.73 (H) 12/26/2020   CALCIUM 9.3 12/26/2020   GFRNONAA 41 (L) 12/26/2020   GFRAA 43 (L) 06/12/2020   Lipid Panel:  Lab Results  Component Value Date   LDLCALC 64 07/22/2020   HgbA1c:  Lab Results  Component Value Date   HGBA1C 7.0 (H) 11/04/2020   Urine Drug Screen: No results found for: LABOPIA, COCAINSCRNUR, LABBENZ, AMPHETMU, THCU, LABBARB  Alcohol Level     Component Value Date/Time   [facility] <10 11/18/2020 1548     Impression   This is a 75 year old gentleman with a history of hypertension, hyperlipidemia, diabetes, CHF, A. fib who presents to the ED with complaints of imbalance and  frequent falls.   Falls appear to be of 2 types: 1 precipitated by syncope, and 1 type related to imbalance. Will defer syncope w/u to admitting hospitalist team. Given known cardiovascular dysfunction eval of cardiogenic syncope should be fully investigated. Patient has not had outpatient cardiac monitoring.  Regarding his frequent falls 2/2 imbalance: he has unexplained weakness L proximal LE and sensory level at T4 with rapid progression of functional decline. Recommend MRI wwo contrast C/T/L spine r/o cord compression or radiculopathy.  Shaking spells sound most c/w postural/orthostatic tremor; given diffuse shaking with no alteration of consciousness epileptic etiology highly unlikely.  Recommendations   - MRI c/t/l spine wwo contrast - Monitor on tele - Additional syncope w/u per primary team - PT eval - Will continue to follow ______________________________________________________________________   Thank you for the opportunity to take part in the care of this patient. If you have any further questions, please contact the neurology consultation attending.  Signed,  Su Monks, MD [facility] [phone]  If 7pm- 7am, please page neurology on call as listed in [place].

## 2020-12-26 NOTE — ED Notes (Signed)
Pt taken to MRI  

## 2020-12-27 DIAGNOSIS — R296 Repeated falls: Secondary | ICD-10-CM | POA: Diagnosis not present

## 2020-12-27 LAB — COMPREHENSIVE METABOLIC PANEL
ALT: 123 U/L — ABNORMAL HIGH (ref 0–44)
AST: 85 U/L — ABNORMAL HIGH (ref 15–41)
Albumin: 3.6 g/dL (ref 3.5–5.0)
Alkaline Phosphatase: 76 U/L (ref 38–126)
Anion gap: 10 (ref 5–15)
BUN: 20 mg/dL (ref 8–23)
CO2: 24 mmol/L (ref 22–32)
Calcium: 8.8 mg/dL — ABNORMAL LOW (ref 8.9–10.3)
Chloride: 105 mmol/L (ref 98–111)
Creatinine, Ser: 1.29 mg/dL — ABNORMAL HIGH (ref 0.61–1.24)
GFR, Estimated: 59 mL/min — ABNORMAL LOW (ref >60)
Glucose, Bld: 78 mg/dL (ref 70–99)
Potassium: 3.2 mmol/L — ABNORMAL LOW (ref 3.5–5.1)
Sodium: 139 mmol/L (ref 135–145)
Total Bilirubin: 1.1 mg/dL (ref 0.3–1.2)
Total Protein: 6.5 g/dL (ref 6.5–8.1)

## 2020-12-27 LAB — CBC
HCT: 40.5 % (ref 39.0–52.0)
Hemoglobin: 14.1 g/dL (ref 13.0–17.0)
MCH: 31.6 pg (ref 26.0–34.0)
MCHC: 34.8 g/dL (ref 30.0–36.0)
MCV: 90.8 fL (ref 80.0–100.0)
Platelets: 158 10*3/uL (ref 150–400)
RBC: 4.46 MIL/uL (ref 4.22–5.81)
RDW: 13.9 % (ref 11.5–15.5)
WBC: 5.6 10*3/uL (ref 4.0–10.5)
nRBC: 0 % (ref 0.0–0.2)

## 2020-12-27 LAB — GLUCOSE, CAPILLARY: Glucose-Capillary: 90 mg/dL (ref 70–99)

## 2020-12-27 NOTE — Care Management CC44 (Signed)
Condition Code 44 Documentation Completed  Patient Details  Name: DMITRIY GAIR MRN: [id number] Date of Birth: 29-Jul-1946   Condition Code 44 given:  Yes Patient signature on Condition Code 44 notice:  Yes Documentation of 2 MD's agreement:  Yes Code 44 added to claim:  Yes    Izola Price, RN 12/27/2020, 9:55 AM

## 2020-12-27 NOTE — Progress Notes (Incomplete)
Patient discharged per orders. PIV/tele removed from patient. FWW delivered. AVS reviewed with patient; expressed understanding. Taken down to medical mall entrance via wheelchair.

## 2020-12-27 NOTE — Discharge Summary (Signed)
Physician Discharge Summary   Tom Moore  male DOB: [birth year]  ZOX:[id number]  PCP: Jerrol Banana., MD  Admit date: 12/26/2020 Discharge date: 12/27/2020  Admitted From: home Disposition:  home Home Health: Yes CODE STATUS: Full code  Discharge Instructions    Ambulatory referral to Neurosurgery   Complete by: As directed    Spinal stenosis   Discharge instructions   Complete by: As directed    You have been evaluated by inpatient neurology and had extensive MRI scan of your brain and spinal cord.  There is not 1 clear reason for your frequent falls.  You may have neuropathy which affects your balance, or weakness that affects your strength.  Please be sure to walk with a walker and continue outpatient physical therapy.  You have spinal stenosis and this can be followed up by outpatient neurosurgery.    To complete your workup for possible syncope, please follow up with your regular outpatient cardiologist Dr. Clayborn Bigness for outpatient cardiac monitoring.   Dr. Enzo Bi - -      24 Day Unplanned Readmission Risk Score   Flowsheet Row ED to Hosp-Admission (Current) from 12/26/2020 in [facility]  30 Day Unplanned Readmission Risk Score (%) 16.74 Filed at 12/27/2020 0800     This score is the patient's risk of an unplanned readmission within 30 days of being discharged (0 -100%). The score is based on dignosis, age, lab data, medications, orders, and past utilization.   Low:  0-14.9   Medium: 15-21.9   High: 22-29.9   Extreme: 30 and above         Hospital Course:  For full details, please see H&P, progress notes, consult notes and ancillary notes.  Briefly,  CYLAS FALZONE is a 75 y.o. male with hx of A. fib, systolic CHF, type 2 diabetes, hypertension, hyperlipidemia, CKD, hypothyroidism, who presented from home with frequent falls.  #Frequent falls #Poor balance #Distal neuropathy Falls appear to be of 2 types: precipitated by  syncope, and related to imbalance.  Per neuro consult, regarding his frequent falls 2/2 imbalance: he has unexplained weakness L proximal LE and sensory level at T4 with rapid progression of functional decline. MRI wwo contrast C/T/L spine performed which found cervical radiculopathy, which is not contributing to his falls, per neuro.    Per neuro, shaking spells sound most c/w postural/orthostatic tremor; given diffuse shaking with no alteration of consciousness epileptic etiology highly unlikely.  Pt reported numbness in his feet mostly at night time, and his falls also occurred mostly at night.  Imbalance due to neuropathy may also contribute to the falls.  Vit B12 412.  Neuropathy likely from diabetes.  Pt reported only using a cane, so was advised to use a walker instead.  Pt already has HHPT set up.  Will continue after discharge.  #Syncope Pt has had 2 prior hospitalizations in recent months for syncope, one was attributed to orthostasis, one was attributed to hypoglycemia.  Pt was on tele monitoring during both hospitalizations with no remarkable events.  Pt was advised to follow up with cardiology for long-term cardiac monitoring.  #Elevated troponin, not clinically significant Trop only 21 then down to 16. No chest pain, shortness of breath, or signs of CHF on exam.  EKG nonischemic and unchanged from prior.    #Transaminitis, mild acute hepatitis panel neg.  outpatient followup.  #DM2 Cont current home regimen.  Hypertension -continue metoprolol.  Entresto recently discontinued by PCP, however,  pt's BP elevated during hospitalization, so Entresto resumed.  Hyperlipidemia-continue atorvastatin.   Anxiety/depression-continue Wellbutrin, sertraline, venlafaxine  BPH-continue dutasteride, Flomax recently discontinued by PCP Per outpatient notes  Hyperthyroidism-continue methimazole.  GERD-continue PPI, sucralfate  A. fib-continue amiodarone and metop   Discharge  Diagnoses:  Active Problems:   Depression   Mixed hyperlipidemia   Essential hypertension   Type 2 diabetes mellitus without complication, with long-term current use of insulin (HCC)   GERD without esophagitis   Heart failure with reduced ejection fraction (HCC)   AF (paroxysmal atrial fibrillation) ([place])   Subclinical hyperthyroidism   CKD stage 3 due to type 2 diabetes mellitus ([place])   Frequent falls    Discharge Instructions:  Allergies as of 12/27/2020      Reactions   Zyrtec Allergy [cetirizine] Diarrhea      Medication List    TAKE these medications   atorvastatin 40 MG tablet Commonly known as: LIPITOR Take 40 mg by mouth daily.   buPROPion 150 MG 12 hr tablet Commonly known as: WELLBUTRIN SR Take 1 tablet (150 mg total) by mouth 2 (two) times daily.   dutasteride 0.5 MG capsule Commonly known as: AVODART Take 1 capsule by mouth once daily   insulin NPH-regular Human (70-30) 100 UNIT/ML injection Inject 20 Units into the skin 2 (two) times daily with a meal.   metFORMIN 1000 MG tablet Commonly known as: GLUCOPHAGE TAKE 1 TABLET BY MOUTH TWICE DAILY WITH A MEAL What changed: when to take this   methimazole 5 MG tablet Commonly known as: TAPAZOLE Take 5 mg by mouth.   metoprolol tartrate 25 MG tablet Commonly known as: LOPRESSOR Take 25 mg by mouth 2 (two) times daily.   multivitamin with minerals Tabs tablet Take 1 tablet by mouth daily.   omeprazole 40 MG capsule Commonly known as: PRILOSEC Take 1 capsule by mouth once daily   Pacerone 200 MG tablet Generic drug: amiodarone Take 200 mg by mouth daily.   sacubitril-valsartan 24-26 MG Commonly known as: ENTRESTO Take 1 tablet by mouth daily.   sertraline 100 MG tablet Commonly known as: ZOLOFT Take 1 tablet by mouth once daily   sucralfate 1 g tablet Commonly known as: CARAFATE TAKE 1 TABLET BY MOUTH THREE TIMES DAILY WITH MEALS   tamsulosin 0.4 MG Caps capsule Commonly known as:  FLOMAX TAKE 2 CAPSULES BY MOUTH ONCE DAILY WITH LUNCH What changed: See the new instructions.   Trulicity 1.5 HM/0.9OB Sopn Generic drug: Dulaglutide Inject 1.5 mg into the skin once a week. Mondays   venlafaxine XR 150 MG 24 hr capsule Commonly known as: EFFEXOR-XR TAKE 1 CAPSULE BY MOUTH ONCE DAILY WITH BREAKFAST        Follow-up Information    Deetta Perla, MD. Schedule an appointment as soon as possible for a visit in 2 week(s).   Specialty: Neurosurgery Why: spinal stenosis Contact information: [place] [address] [phone]        Lujean Amel D, MD. Schedule an appointment as soon as possible for a visit in 1 week(s).   Specialties: Cardiology, Internal Medicine Why: outpatient cardiac monitoring Contact information: [address] [place] [address] [phone]               Allergies  Allergen Reactions  . Zyrtec Allergy [Cetirizine] Diarrhea     The results of significant diagnostics from this hospitalization (including imaging, microbiology, ancillary and laboratory) are listed below for reference.   Consultations:   Procedures/Studies: CT  Angio Head W or Wo Contrast  Result Date: 12/26/2020 CLINICAL DATA:  Dizziness. EXAM: CT ANGIOGRAPHY HEAD AND NECK TECHNIQUE: Multidetector CT imaging of the head and neck was performed using the standard protocol during bolus administration of intravenous contrast. Multiplanar CT image reconstructions and MIPs were obtained to evaluate the vascular anatomy. Carotid stenosis measurements (when applicable) are obtained utilizing NASCET criteria, using the distal internal carotid diameter as the denominator. CONTRAST:  24mL OMNIPAQUE IOHEXOL 350 MG/ML SOLN COMPARISON:  same day MRI.  CT head November 18, 2020. FINDINGS: CTA NECK FINDINGS Aortic arch: Imaged portion shows no evidence of aneurysm or dissection. No significant stenosis of the major arch vessel origins. Calcific  atherosclerosis. Right carotid system: Predominantly calcific atherosclerosis at the bifurcation without greater than 50% stenosis. Tortuous ICA. Left carotid system: Predominantly calcific atherosclerosis at the bifurcation without greater than 50% stenosis. Tortuous ICA. Vertebral arteries: Left dominant. No evidence of dissection, stenosis (50% or greater) or occlusion. The non dominant right vertebral artery is small throughout its course. Skeleton: Severe degenerative disc disease at C6-C7 and moderate degenerative disease at C5-C6 with disc height loss, endplate sclerosis, endplate cystic change, and posterior disc osteophyte complexes. Other neck: No mass or suspicious adenopathy. Upper chest: Visualized lung apices are clear. Review of the MIP images confirms the above findings CTA HEAD FINDINGS Anterior circulation: Calcific atherosclerosis of bilateral cavernous and paraclinoid ICAs without greater than 50% stenosis. Bilateral MCAs and ACAs are patent without evidence of hemodynamically significant proximal stenosis. No aneurysm identified. Posterior circulation: The small/non dominant right vertebral artery makes a very small contribution to the basilar artery. Mild narrowing of the intradural left vertebral artery secondary to calcific atherosclerosis. Right fetal type PCA. Bilateral posterior cerebral arteries are patent without evidence of hemodynamically significant proximal stenosis. Mild multifocal narrowing/irregularity of the PCAs, likely related to atherosclerosis. No aneurysm identified. Venous sinuses: As permitted by contrast timing, patent. Anatomic variants: Right fetal type PCA. Review of the MIP images confirms the above findings IMPRESSION: 1. No evidence of emergent large vessel occlusion or hemodynamically significant proximal stenosis in the head or neck. 2. Multiple areas of mild narrowing secondary to atherosclerosis, as detailed above. Electronically Signed   By: Margaretha Sheffield  MD   On: 12/26/2020 13:08   CT Angio Neck W and/or Wo Contrast  Result Date: 12/26/2020 CLINICAL DATA:  Dizziness. EXAM: CT ANGIOGRAPHY HEAD AND NECK TECHNIQUE: Multidetector CT imaging of the head and neck was performed using the standard protocol during bolus administration of intravenous contrast. Multiplanar CT image reconstructions and MIPs were obtained to evaluate the vascular anatomy. Carotid stenosis measurements (when applicable) are obtained utilizing NASCET criteria, using the distal internal carotid diameter as the denominator. CONTRAST:  1mL OMNIPAQUE IOHEXOL 350 MG/ML SOLN COMPARISON:  same day MRI.  CT head November 18, 2020. FINDINGS: CTA NECK FINDINGS Aortic arch: Imaged portion shows no evidence of aneurysm or dissection. No significant stenosis of the major arch vessel origins. Calcific atherosclerosis. Right carotid system: Predominantly calcific atherosclerosis at the bifurcation without greater than 50% stenosis. Tortuous ICA. Left carotid system: Predominantly calcific atherosclerosis at the bifurcation without greater than 50% stenosis. Tortuous ICA. Vertebral arteries: Left dominant. No evidence of dissection, stenosis (50% or greater) or occlusion. The non dominant right vertebral artery is small throughout its course. Skeleton: Severe degenerative disc disease at C6-C7 and moderate degenerative disease at C5-C6 with disc height loss, endplate sclerosis, endplate cystic change, and posterior disc osteophyte complexes. Other neck: No mass or suspicious adenopathy.  Upper chest: Visualized lung apices are clear. Review of the MIP images confirms the above findings CTA HEAD FINDINGS Anterior circulation: Calcific atherosclerosis of bilateral cavernous and paraclinoid ICAs without greater than 50% stenosis. Bilateral MCAs and ACAs are patent without evidence of hemodynamically significant proximal stenosis. No aneurysm identified. Posterior circulation: The small/non dominant right  vertebral artery makes a very small contribution to the basilar artery. Mild narrowing of the intradural left vertebral artery secondary to calcific atherosclerosis. Right fetal type PCA. Bilateral posterior cerebral arteries are patent without evidence of hemodynamically significant proximal stenosis. Mild multifocal narrowing/irregularity of the PCAs, likely related to atherosclerosis. No aneurysm identified. Venous sinuses: As permitted by contrast timing, patent. Anatomic variants: Right fetal type PCA. Review of the MIP images confirms the above findings IMPRESSION: 1. No evidence of emergent large vessel occlusion or hemodynamically significant proximal stenosis in the head or neck. 2. Multiple areas of mild narrowing secondary to atherosclerosis, as detailed above. Electronically Signed   By: Margaretha Sheffield MD   On: 12/26/2020 13:08   MR BRAIN WO CONTRAST  Result Date: 12/26/2020 CLINICAL DATA:  Vertigo EXAM: MRI HEAD WITHOUT CONTRAST TECHNIQUE: Multiplanar, multiecho pulse sequences of the brain and surrounding structures were obtained without intravenous contrast. COMPARISON:  None. FINDINGS: Brain: No acute infarction, hemorrhage, or hydrocephalus. No intraparenchymal mass lesion. Moderate scattered T2/FLAIR hyperintensities within the white matter, nonspecific but most likely related to chronic microvascular ischemic disease given patient age. Moderate generalized cerebral atrophy. Along the vertex posteriorly there is prominence of the extra-axial spaces with mild mass effect on the adjacent parietal lobes (series 10, image 20), suspected to represent small arachnoid cysts. Additionally there is mild prominence of the retro cerebellar CSF on the right, likely mega cisterna magna or an additional small right arachnoid cyst. No midline shift. Basal cisterns are patent. Vascular: Major arterial flow voids are maintained at the skull base. Non dominant right vertebral artery and tortuous  vertebrobasilar system. Skull and upper cervical spine: Normal marrow signal. Sinuses/Orbits: Mild ethmoid air cell and right frontal sinus mucosal thickening. No air-fluid levels. Unremarkable orbits. Right lens extraction. Other: No mastoid effusions. IMPRESSION: 1. No evidence of acute intracranial abnormality. Specifically, no acute infarct. 2. Moderate chronic microvascular ischemic disease and generalized cerebral atrophy. 3. Suspected high biparietal convexity arachnoid cysts with mild mass effect, unlikely to be of clinical significance. Electronically Signed   By: Margaretha Sheffield MD   On: 12/26/2020 10:53   MR CERVICAL SPINE W WO CONTRAST  Result Date: 12/26/2020 CLINICAL DATA:  Imbalance.  Frequent falls. EXAM: MRI CERVICAL, THORACIC AND LUMBAR SPINE WITHOUT CONTRAST TECHNIQUE: Multiplanar and multiecho pulse sequences of the cervical spine, to include the craniocervical junction and cervicothoracic junction, and thoracic and lumbar spine, were obtained without intravenous contrast. COMPARISON:  None. FINDINGS: MRI CERVICAL SPINE FINDINGS Some sequences (including STIR) are degraded by motion. Within this limitation: Alignment: Approximately 2 mm of anterolisthesis of C7 on T1. Otherwise, no substantial sagittal subluxation. Mild straightening. Vertebrae: Vertebral body heights are maintained. No specific evidence of acute fracture, discitis/osteomyelitis, or suspicious bone lesion. No abnormal enhancement. Degenerative/discogenic endplate signal changes at C5-C6 and C6-C7. Cord: Normal cord signal.  No cord enhancement. Posterior Fossa, vertebral arteries, paraspinal tissues: Mild edema and enhancement in the subcutaneous fat of the posterior/lower neck overlying the T1 spinous process (series 8, image 8). Disc levels: C2-C3: Bilateral facet and uncovertebral hypertrophy without significant canal or foraminal stenosis. C3-C4: Small posterior disc osteophyte complex and left greater than right facet  and uncovertebral hypertrophy. Moderate left and mild right foraminal stenosis. Posterior disc contacts and flattens the left eccentric ventral cord without significant canal stenosis. C4-C5: Posterior disc osteophyte complex and left greater than right facet and uncovertebral hypertrophy. Resulting severe left and mild right foraminal stenosis. Moderate canal stenosis. C5-C6: Posterior disc osteophyte complex and left greater than right facet and uncovertebral hypertrophy. Resulting severe left foraminal stenosis and mild right foraminal stenosis. Mild canal stenosis. C6-C7: Posterior disc osteophyte complex and left greater than right facet and uncovertebral hypertrophy. Resulting severe left foraminal stenosis and mild right foraminal stenosis. Mild canal stenosis. C7-T1: No significant disc protrusion, foraminal stenosis, or canal stenosis. MRI THORACIC SPINE FINDINGS Alignment:  No substantial sagittal subluxation. Vertebrae: Degenerative/discogenic endplate signal changes about the T4-T5 disc. Vertebral body heights are maintained. No specific evidence of acute fracture, discitis/osteomyelitis, or suspicious bone lesion. Cord: Normal cord signal.  No abnormal enhancement. Paraspinal and other soft tissues: Unremarkable Disc levels: Multiple small disc bulges without significant canal stenosis. Multilevel facet hypertrophy. Mild to moderate bilateral foraminal stenosis at T1-T2. Otherwise, multilevel mild foraminal stenosis in the thoracic spine. MRI LUMBAR SPINE FINDINGS Segmentation:  Standard Alignment:  Slight (2 mm) retrolisthesis of L5 on S1. Vertebrae: Degenerative/discogenic endplate signal changes about the L5-S1 disc. Vertebral body heights are maintained. No specific evidence of acute fracture, discitis/osteomyelitis, or suspicious bone lesion. No abnormal enhancement. Conus medullaris and cauda equina: Conus extends to the L1 level. Conus appears normal. No abnormal enhancement. Paraspinal and other  soft tissues: Unremarkable. Disc levels: T12-L1: Left subarticular/foraminal disc protrusion and mild bilateral facet hypertrophy without significant canal or foraminal stenosis. L1-L2: Mild disc bulging and mild bilateral facet hypertrophy without significant canal or foraminal stenosis. L2-L3: Mild disc bulging and mild bilateral facet hypertrophy without significant canal or foraminal stenosis. L3-L4: Right eccentric disc bulge with superimposed inferiorly dissecting central disc protrusion and right foraminal disc protrusion. Mild bilateral facet hypertrophy. Resulting moderate right foraminal stenosis. Mild canal stenosis and mild right greater than left subarticular recess stenosis. L4-L5: Broad disc bulge with small superimposed left subarticular disc protrusion. Mild bilateral foraminal stenosis without significant canal stenosis. L5-S1: Disc desiccation and height loss. Approximately 2 mm of retrolisthesis of L5 on S1. Central disc protrusion and small right foraminal disc protrusion. Mild right foraminal stenosis without significant canal stenosis. IMPRESSION: Cervical spine: 1. Severe left and mild right foraminal stenosis at C4-C5, C5-C6 and C6-C7. Moderate left and mild right foraminal stenosis C3-C4. 2. Moderate canal stenosis at C4-C5 and mild canal stenosis at C5-C6 and C6-C7. 3. Mild edema and enhancement in the subcutaneous fat of the posterior/lower neck overlying the T1 spinous process, which could be inflammatory, infectious or vascular. Thoracic spine: 1. Mild to moderate bilateral foraminal stenosis at T1-T2. Otherwise, multilevel mild foraminal stenosis. 2. No significant canal stenosis. Lumbar spine: 1. Moderate right foraminal stenosis at L3-L4. Mild foraminal stenosis bilaterally at L4-L5 and on the right at L5-S1. 2. Mild canal stenosis at L3-L4. Electronically Signed   By: Margaretha Sheffield MD   On: 12/26/2020 15:32   MR THORACIC SPINE W WO CONTRAST  Result Date: 12/26/2020 CLINICAL  DATA:  Imbalance.  Frequent falls. EXAM: MRI CERVICAL, THORACIC AND LUMBAR SPINE WITHOUT CONTRAST TECHNIQUE: Multiplanar and multiecho pulse sequences of the cervical spine, to include the craniocervical junction and cervicothoracic junction, and thoracic and lumbar spine, were obtained without intravenous contrast. COMPARISON:  None. FINDINGS: MRI CERVICAL SPINE FINDINGS Some sequences (including STIR) are degraded by motion. Within this limitation: Alignment:  Approximately 2 mm of anterolisthesis of C7 on T1. Otherwise, no substantial sagittal subluxation. Mild straightening. Vertebrae: Vertebral body heights are maintained. No specific evidence of acute fracture, discitis/osteomyelitis, or suspicious bone lesion. No abnormal enhancement. Degenerative/discogenic endplate signal changes at C5-C6 and C6-C7. Cord: Normal cord signal.  No cord enhancement. Posterior Fossa, vertebral arteries, paraspinal tissues: Mild edema and enhancement in the subcutaneous fat of the posterior/lower neck overlying the T1 spinous process (series 8, image 8). Disc levels: C2-C3: Bilateral facet and uncovertebral hypertrophy without significant canal or foraminal stenosis. C3-C4: Small posterior disc osteophyte complex and left greater than right facet and uncovertebral hypertrophy. Moderate left and mild right foraminal stenosis. Posterior disc contacts and flattens the left eccentric ventral cord without significant canal stenosis. C4-C5: Posterior disc osteophyte complex and left greater than right facet and uncovertebral hypertrophy. Resulting severe left and mild right foraminal stenosis. Moderate canal stenosis. C5-C6: Posterior disc osteophyte complex and left greater than right facet and uncovertebral hypertrophy. Resulting severe left foraminal stenosis and mild right foraminal stenosis. Mild canal stenosis. C6-C7: Posterior disc osteophyte complex and left greater than right facet and uncovertebral hypertrophy. Resulting  severe left foraminal stenosis and mild right foraminal stenosis. Mild canal stenosis. C7-T1: No significant disc protrusion, foraminal stenosis, or canal stenosis. MRI THORACIC SPINE FINDINGS Alignment:  No substantial sagittal subluxation. Vertebrae: Degenerative/discogenic endplate signal changes about the T4-T5 disc. Vertebral body heights are maintained. No specific evidence of acute fracture, discitis/osteomyelitis, or suspicious bone lesion. Cord: Normal cord signal.  No abnormal enhancement. Paraspinal and other soft tissues: Unremarkable Disc levels: Multiple small disc bulges without significant canal stenosis. Multilevel facet hypertrophy. Mild to moderate bilateral foraminal stenosis at T1-T2. Otherwise, multilevel mild foraminal stenosis in the thoracic spine. MRI LUMBAR SPINE FINDINGS Segmentation:  Standard Alignment:  Slight (2 mm) retrolisthesis of L5 on S1. Vertebrae: Degenerative/discogenic endplate signal changes about the L5-S1 disc. Vertebral body heights are maintained. No specific evidence of acute fracture, discitis/osteomyelitis, or suspicious bone lesion. No abnormal enhancement. Conus medullaris and cauda equina: Conus extends to the L1 level. Conus appears normal. No abnormal enhancement. Paraspinal and other soft tissues: Unremarkable. Disc levels: T12-L1: Left subarticular/foraminal disc protrusion and mild bilateral facet hypertrophy without significant canal or foraminal stenosis. L1-L2: Mild disc bulging and mild bilateral facet hypertrophy without significant canal or foraminal stenosis. L2-L3: Mild disc bulging and mild bilateral facet hypertrophy without significant canal or foraminal stenosis. L3-L4: Right eccentric disc bulge with superimposed inferiorly dissecting central disc protrusion and right foraminal disc protrusion. Mild bilateral facet hypertrophy. Resulting moderate right foraminal stenosis. Mild canal stenosis and mild right greater than left subarticular recess  stenosis. L4-L5: Broad disc bulge with small superimposed left subarticular disc protrusion. Mild bilateral foraminal stenosis without significant canal stenosis. L5-S1: Disc desiccation and height loss. Approximately 2 mm of retrolisthesis of L5 on S1. Central disc protrusion and small right foraminal disc protrusion. Mild right foraminal stenosis without significant canal stenosis. IMPRESSION: Cervical spine: 1. Severe left and mild right foraminal stenosis at C4-C5, C5-C6 and C6-C7. Moderate left and mild right foraminal stenosis C3-C4. 2. Moderate canal stenosis at C4-C5 and mild canal stenosis at C5-C6 and C6-C7. 3. Mild edema and enhancement in the subcutaneous fat of the posterior/lower neck overlying the T1 spinous process, which could be inflammatory, infectious or vascular. Thoracic spine: 1. Mild to moderate bilateral foraminal stenosis at T1-T2. Otherwise, multilevel mild foraminal stenosis. 2. No significant canal stenosis. Lumbar spine: 1. Moderate right foraminal stenosis at L3-L4. Mild foraminal stenosis  bilaterally at L4-L5 and on the right at L5-S1. 2. Mild canal stenosis at L3-L4. Electronically Signed   By: Margaretha Sheffield MD   On: 12/26/2020 15:32   MR Lumbar Spine W Wo Contrast  Result Date: 12/26/2020 CLINICAL DATA:  Imbalance.  Frequent falls. EXAM: MRI CERVICAL, THORACIC AND LUMBAR SPINE WITHOUT CONTRAST TECHNIQUE: Multiplanar and multiecho pulse sequences of the cervical spine, to include the craniocervical junction and cervicothoracic junction, and thoracic and lumbar spine, were obtained without intravenous contrast. COMPARISON:  None. FINDINGS: MRI CERVICAL SPINE FINDINGS Some sequences (including STIR) are degraded by motion. Within this limitation: Alignment: Approximately 2 mm of anterolisthesis of C7 on T1. Otherwise, no substantial sagittal subluxation. Mild straightening. Vertebrae: Vertebral body heights are maintained. No specific evidence of acute fracture,  discitis/osteomyelitis, or suspicious bone lesion. No abnormal enhancement. Degenerative/discogenic endplate signal changes at C5-C6 and C6-C7. Cord: Normal cord signal.  No cord enhancement. Posterior Fossa, vertebral arteries, paraspinal tissues: Mild edema and enhancement in the subcutaneous fat of the posterior/lower neck overlying the T1 spinous process (series 8, image 8). Disc levels: C2-C3: Bilateral facet and uncovertebral hypertrophy without significant canal or foraminal stenosis. C3-C4: Small posterior disc osteophyte complex and left greater than right facet and uncovertebral hypertrophy. Moderate left and mild right foraminal stenosis. Posterior disc contacts and flattens the left eccentric ventral cord without significant canal stenosis. C4-C5: Posterior disc osteophyte complex and left greater than right facet and uncovertebral hypertrophy. Resulting severe left and mild right foraminal stenosis. Moderate canal stenosis. C5-C6: Posterior disc osteophyte complex and left greater than right facet and uncovertebral hypertrophy. Resulting severe left foraminal stenosis and mild right foraminal stenosis. Mild canal stenosis. C6-C7: Posterior disc osteophyte complex and left greater than right facet and uncovertebral hypertrophy. Resulting severe left foraminal stenosis and mild right foraminal stenosis. Mild canal stenosis. C7-T1: No significant disc protrusion, foraminal stenosis, or canal stenosis. MRI THORACIC SPINE FINDINGS Alignment:  No substantial sagittal subluxation. Vertebrae: Degenerative/discogenic endplate signal changes about the T4-T5 disc. Vertebral body heights are maintained. No specific evidence of acute fracture, discitis/osteomyelitis, or suspicious bone lesion. Cord: Normal cord signal.  No abnormal enhancement. Paraspinal and other soft tissues: Unremarkable Disc levels: Multiple small disc bulges without significant canal stenosis. Multilevel facet hypertrophy. Mild to moderate  bilateral foraminal stenosis at T1-T2. Otherwise, multilevel mild foraminal stenosis in the thoracic spine. MRI LUMBAR SPINE FINDINGS Segmentation:  Standard Alignment:  Slight (2 mm) retrolisthesis of L5 on S1. Vertebrae: Degenerative/discogenic endplate signal changes about the L5-S1 disc. Vertebral body heights are maintained. No specific evidence of acute fracture, discitis/osteomyelitis, or suspicious bone lesion. No abnormal enhancement. Conus medullaris and cauda equina: Conus extends to the L1 level. Conus appears normal. No abnormal enhancement. Paraspinal and other soft tissues: Unremarkable. Disc levels: T12-L1: Left subarticular/foraminal disc protrusion and mild bilateral facet hypertrophy without significant canal or foraminal stenosis. L1-L2: Mild disc bulging and mild bilateral facet hypertrophy without significant canal or foraminal stenosis. L2-L3: Mild disc bulging and mild bilateral facet hypertrophy without significant canal or foraminal stenosis. L3-L4: Right eccentric disc bulge with superimposed inferiorly dissecting central disc protrusion and right foraminal disc protrusion. Mild bilateral facet hypertrophy. Resulting moderate right foraminal stenosis. Mild canal stenosis and mild right greater than left subarticular recess stenosis. L4-L5: Broad disc bulge with small superimposed left subarticular disc protrusion. Mild bilateral foraminal stenosis without significant canal stenosis. L5-S1: Disc desiccation and height loss. Approximately 2 mm of retrolisthesis of L5 on S1. Central disc protrusion and small right foraminal disc  protrusion. Mild right foraminal stenosis without significant canal stenosis. IMPRESSION: Cervical spine: 1. Severe left and mild right foraminal stenosis at C4-C5, C5-C6 and C6-C7. Moderate left and mild right foraminal stenosis C3-C4. 2. Moderate canal stenosis at C4-C5 and mild canal stenosis at C5-C6 and C6-C7. 3. Mild edema and enhancement in the subcutaneous fat  of the posterior/lower neck overlying the T1 spinous process, which could be inflammatory, infectious or vascular. Thoracic spine: 1. Mild to moderate bilateral foraminal stenosis at T1-T2. Otherwise, multilevel mild foraminal stenosis. 2. No significant canal stenosis. Lumbar spine: 1. Moderate right foraminal stenosis at L3-L4. Mild foraminal stenosis bilaterally at L4-L5 and on the right at L5-S1. 2. Mild canal stenosis at L3-L4. Electronically Signed   By: Margaretha Sheffield MD   On: 12/26/2020 15:32      Labs: BNP (last 3 results) Recent Labs    12/26/20 0927  BNP 147.8*   Basic Metabolic Panel: Recent Labs  Lab 12/26/20 0927 12/27/20 0606  NA 142 139  K 3.8 3.2*  CL 105 105  CO2 26 24  GLUCOSE 125* 78  BUN 22 20  CREATININE 1.73* 1.29*  CALCIUM 9.3 8.8*  MG 1.7  --    Liver Function Tests: Recent Labs  Lab 12/26/20 0927 12/27/20 0606  AST 83* 85*  ALT 133* 123*  ALKPHOS 95 76  BILITOT 1.1 1.1  PROT 7.4 6.5  ALBUMIN 4.1 3.6   No results for input(s): LIPASE, AMYLASE in the last 168 hours. No results for input(s): AMMONIA in the last 168 hours. CBC: Recent Labs  Lab 12/26/20 0927 12/27/20 0606  WBC 5.7 5.6  NEUTROABS 3.7  --   HGB 14.7 14.1  HCT 43.4 40.5  MCV 90.2 90.8  PLT 180 158   Cardiac Enzymes: No results for input(s): CKTOTAL, CKMB, CKMBINDEX, TROPONINI in the last 168 hours. BNP: Invalid input(s): POCBNP CBG: Recent Labs  Lab 12/26/20 1606 12/26/20 2145 12/27/20 0806  GLUCAP 112* 107* 90   D-Dimer No results for input(s): DDIMER in the last 72 hours. Hgb A1c No results for input(s): HGBA1C in the last 72 hours. Lipid Profile No results for input(s): CHOL, HDL, LDLCALC, TRIG, CHOLHDL, LDLDIRECT in the last 72 hours. Thyroid function studies Recent Labs    12/26/20 0927  TSH 1.529   Anemia work up Recent Labs    12/26/20 1516  VITAMINB12 412  FOLATE 28.0   Urinalysis    Component Value Date/Time   COLORURINE YELLOW (A)  11/18/2020 1846   APPEARANCEUR CLEAR (A) 11/18/2020 1846   APPEARANCEUR Hazy (A) 08/27/2020 1503   LABSPEC 1.026 11/18/2020 1846   LABSPEC 1.013 01/01/2014 1619   PHURINE 5.0 11/18/2020 1846   GLUCOSEU NEGATIVE 11/18/2020 1846   GLUCOSEU >=500 01/01/2014 1619   HGBUR NEGATIVE 11/18/2020 1846   BILIRUBINUR NEGATIVE 11/18/2020 1846   BILIRUBINUR Negative 08/27/2020 1503   BILIRUBINUR Negative 01/01/2014 1619   KETONESUR 5 (A) 11/18/2020 1846   PROTEINUR NEGATIVE 11/18/2020 1846   UROBILINOGEN 0.2 07/05/2019 1020   NITRITE NEGATIVE 11/18/2020 1846   LEUKOCYTESUR NEGATIVE 11/18/2020 1846   LEUKOCYTESUR Negative 01/01/2014 1619   Sepsis Labs Invalid input(s): PROCALCITONIN,  WBC,  LACTICIDVEN Microbiology Recent Results (from the past 240 hour(s))  Resp Panel by RT-PCR (Flu A&B, Covid) Nasopharyngeal Swab     Status: None   Collection Time: 12/26/20 12:14 PM   Specimen: Nasopharyngeal Swab; Nasopharyngeal(NP) swabs in vial transport medium  Result Value Ref Range Status   SARS Coronavirus 2 by RT PCR  NEGATIVE NEGATIVE Final    Comment: (NOTE) SARS-CoV-2 target nucleic acids are NOT DETECTED.  The SARS-CoV-2 RNA is generally detectable in upper respiratory specimens during the acute phase of infection. The lowest concentration of SARS-CoV-2 viral copies this assay can detect is 138 copies/mL. A negative result does not preclude SARS-Cov-2 infection and should not be used as the sole basis for treatment or other patient management decisions. A negative result may occur with  improper specimen collection/handling, submission of specimen other than nasopharyngeal swab, presence of viral mutation(s) within the areas targeted by this assay, and inadequate number of viral copies(<138 copies/mL). A negative result must be combined with clinical observations, patient history, and epidemiological information. The expected result is Negative.  Fact Sheet for Patients:   EntrepreneurPulse.com.au  Fact Sheet for Healthcare Providers:  IncredibleEmployment.be  This test is no t yet approved or cleared by the [place] FDA and  has been authorized for detection and/or diagnosis of SARS-CoV-2 by FDA under an Emergency Use Authorization (EUA). This EUA will remain  in effect (meaning this test can be used) for the duration of the COVID-19 declaration under Section 564(b)(1) of the Act, 21 U.S.C.section 360bbb-3(b)(1), unless the authorization is terminated  or revoked sooner.       Influenza A by PCR NEGATIVE NEGATIVE Final   Influenza B by PCR NEGATIVE NEGATIVE Final    Comment: (NOTE) The Xpert Xpress SARS-CoV-2/FLU/RSV plus assay is intended as an aid in the diagnosis of influenza from Nasopharyngeal swab specimens and should not be used as a sole basis for treatment. Nasal washings and aspirates are unacceptable for Xpert Xpress SARS-CoV-2/FLU/RSV testing.  Fact Sheet for Patients: EntrepreneurPulse.com.au  Fact Sheet for Healthcare Providers: IncredibleEmployment.be  This test is not yet approved or cleared by the [place] FDA and has been authorized for detection and/or diagnosis of SARS-CoV-2 by FDA under an Emergency Use Authorization (EUA). This EUA will remain in effect (meaning this test can be used) for the duration of the COVID-19 declaration under Section 564(b)(1) of the Act, 21 U.S.C. section 360bbb-3(b)(1), unless the authorization is terminated or revoked.  Performed at [facility], [address] [place], [place] [address]      Total time spend on discharging this patient, including the last patient exam, discussing the hospital stay, instructions for ongoing care as it relates to all pertinent caregivers, as well as preparing the medical discharge records, prescriptions, and/or referrals as applicable, is 45 minutes.    Enzo Bi,  MD  [facility] 12/27/2020, 9:20 AM

## 2020-12-27 NOTE — TOC Transition Note (Signed)
Transition of Care [facility]) - CM/SW Discharge Note   Patient Details  Name: Tom Moore MRN: [id number] Date of Birth: Feb 13, 1946  [facility]) CM/SW Contact:  Izola Price, RN Phone Number: 12/27/2020, 10:02 AM   Clinical Narrative:   Patient to be discharged to Home with DME RW to be delivered by [facility] Code 44 explained to patient with some difficulty but he agreed to signing. Encourage to contact his Medicare plan to get details. To follow up with outpatient therapy. Simmie Davies RN CM     Final next level of care: Home/Self Care (Outpatient Therapy) Barriers to Discharge: Barriers Resolved   Patient Goals and CMS Choice        Discharge Placement                       Discharge Plan and Services                DME Arranged: Walker rolling DME Agency: Other - Comment ([facility]) Date DME Agency Contacted: 12/27/20 Time DME Agency Contacted: 1001 Representative spoke with at DME Agency: Melene Muller HH Arranged: NA [place] Agency: NA        Social Determinants of Health ([place]) Interventions     Readmission Risk Interventions No flowsheet data found.

## 2020-12-27 NOTE — Plan of Care (Signed)
  Problem: Education: Goal: Knowledge of General Education information will improve Description: Including pain rating scale, medication(s)/side effects and non-pharmacologic comfort measures Outcome: Completed/Met   Problem: Health Behavior/Discharge Planning: Goal: Ability to manage health-related needs will improve Outcome: Completed/Met   Problem: Clinical Measurements: Goal: Will remain free from infection Outcome: Completed/Met

## 2020-12-27 NOTE — Progress Notes (Addendum)
Brief Neurology Note  MRI C/T/L spine:  Cervical spine:  1. Severe left and mild right foraminal stenosis at C4-C5, C5-C6 and C6-C7. Moderate left and mild right foraminal stenosis C3-C4. 2. Moderate canal stenosis at C4-C5 and mild canal stenosis at C5-C6 and C6-C7. 3. Mild edema and enhancement in the subcutaneous fat of the posterior/lower neck overlying the T1 spinous process, which could be inflammatory, infectious or vascular.  Thoracic spine:  1. Mild to moderate bilateral foraminal stenosis at T1-T2. Otherwise, multilevel mild foraminal stenosis. 2. No significant canal stenosis.  Lumbar spine:  1. Moderate right foraminal stenosis at L3-L4. Mild foraminal stenosis bilaterally at L4-L5 and on the right at L5-S1. 2. Mild canal stenosis at L3-L4.  Actual images and radiology reports personally reviewed.  Patient may be referred to neurosurgery for evaluation on at outpatient basis for evaluation of cervical radiculopathy which is not contributing to his falls. Agree with outpatient cardiac monitoring. May f/u with neurology on an outpatient basis if sx worsen or fail to improve.  Neurology to sign off, but please re-engage if additional neurologic concerns arise.  Su Monks, MD [facility] [phone]  If 7pm- 7am, please page neurology on call as listed in [place].

## 2020-12-29 ENCOUNTER — Other Ambulatory Visit: Payer: Self-pay | Admitting: Family Medicine

## 2020-12-29 DIAGNOSIS — F324 Major depressive disorder, single episode, in partial remission: Secondary | ICD-10-CM

## 2020-12-29 NOTE — Telephone Encounter (Signed)
Future visit in 3 weeks °

## 2021-01-01 LAB — METHYLMALONIC ACID, SERUM: Methylmalonic Acid, Quantitative: 257 nmol/L (ref 0–378)

## 2021-01-19 NOTE — Progress Notes (Deleted)
      Established patient visit   Patient: Tom Moore   DOB: [birth year]   75 y.o. Male  MRN: [id number] Visit Date: 01/20/2021  Today's healthcare provider: [name], MD   No chief complaint on file.  Subjective    HPI  Follow up Hospitalization  Patient was admitted to [facility] on 12/26/2020 and discharged on 12/27/2020. He was treated for; Spinal stenosis. Treatment for this included; see notes in chart. Telephone follow up was done on-none. He reports {excellent/good/fair:19992} compliance with treatment. He reports this condition is {resolved/improved/worsened:23923}.  ----------------------------------------------------------------------------------------- -    {Show patient history (optional):23778::" "}   Medications: Outpatient Medications Prior to Visit  Medication Sig  . atorvastatin (LIPITOR) 40 MG tablet Take 40 mg by mouth daily.  Marland Kitchen buPROPion (WELLBUTRIN SR) 150 MG 12 hr tablet Take 1 tablet (150 mg total) by mouth 2 (two) times daily.  . Dulaglutide (TRULICITY) 1.5 AT/5.5DD SOPN Inject 1.5 mg into the skin once a week. Mondays  . dutasteride (AVODART) 0.5 MG capsule Take 1 capsule by mouth once daily  . insulin NPH-regular Human (70-30) 100 UNIT/ML injection Inject 20 Units into the skin 2 (two) times daily with a meal.  . metFORMIN (GLUCOPHAGE) 1000 MG tablet TAKE 1 TABLET BY MOUTH TWICE DAILY WITH A MEAL (Patient taking differently: Take 1,000 mg by mouth in the morning and at bedtime.)  . methimazole (TAPAZOLE) 5 MG tablet Take 5 mg by mouth.  . metoprolol tartrate (LOPRESSOR) 25 MG tablet Take 25 mg by mouth 2 (two) times daily.  . Multiple Vitamin (MULTIVITAMIN WITH MINERALS) TABS tablet Take 1 tablet by mouth daily.  Marland Kitchen omeprazole (PRILOSEC) 40 MG capsule Take 1 capsule by mouth once daily  . PACERONE 200 MG tablet Take 200 mg by mouth daily.  . sacubitril-valsartan (ENTRESTO) 24-26 MG Take 1 tablet by mouth daily.  . sertraline (ZOLOFT) 100 MG  tablet Take 1 tablet by mouth once daily (Patient taking differently: Take 100 mg by mouth daily.)  . sucralfate (CARAFATE) 1 g tablet TAKE 1 TABLET BY MOUTH THREE TIMES DAILY WITH MEALS  . tamsulosin (FLOMAX) 0.4 MG CAPS capsule TAKE 2 CAPSULES BY MOUTH ONCE DAILY WITH LUNCH (Patient taking differently: Take 0.8 mg by mouth daily with lunch.)  . venlafaxine XR (EFFEXOR-XR) 150 MG 24 hr capsule TAKE 1 CAPSULE BY MOUTH ONCE DAILY WITH BREAKFAST   No facility-administered medications prior to visit.    Review of Systems  Constitutional: Negative for appetite change, chills and fever.  Respiratory: Negative for chest tightness, shortness of breath and wheezing.   Cardiovascular: Negative for chest pain and palpitations.  Gastrointestinal: Negative for abdominal pain, nausea and vomiting.    {Labs  Heme  Chem  Endocrine  Serology  Results Review (optional):23779::" "}   Objective    There were no vitals taken for this visit. {Show previous vital signs (optional):23777::" "}   Physical Exam  ***  No results found for any visits on 01/20/21.  Assessment & Plan     ***  No follow-ups on file.      {provider attestation***:1}   [name], MD  [facility] [phone] (phone) [phone] (fax)  [place]

## 2021-01-20 ENCOUNTER — Ambulatory Visit: Payer: Self-pay | Admitting: Family Medicine

## 2021-02-08 ENCOUNTER — Other Ambulatory Visit: Payer: Self-pay | Admitting: Family Medicine

## 2021-02-08 DIAGNOSIS — F324 Major depressive disorder, single episode, in partial remission: Secondary | ICD-10-CM

## 2021-02-08 NOTE — Telephone Encounter (Signed)
Requested Prescriptions  Pending Prescriptions Disp Refills  . buPROPion (WELLBUTRIN SR) 150 MG 12 hr tablet [Pharmacy Med Name: buPROPion HCl ER (SR) 150 MG Oral Tablet Extended Release 12 Hour] 180 tablet 0    Sig: Take 1 tablet by mouth twice daily     Psychiatry: Antidepressants - bupropion Failed - 02/08/2021 12:26 PM      Failed - Last BP in normal range    BP Readings from Last 1 Encounters:  12/27/20 (!) 160/93         Passed - Completed PHQ-2 or PHQ-9 in the last 360 days      Passed - Valid encounter within last 6 months    Recent Outpatient Visits          2 months ago Hypoglycemia   [facility] Jerrol Banana., MD   3 months ago No-show for appointment   [facility] [place], [place], [place]   3 months ago Encounter for annual wellness visit (AWV) in Medicare patient   [facility] Jerrol Banana., MD   6 months ago Mixed hyperlipidemia   [facility] Jerrol Banana., MD   8 months ago Syncope, unspecified syncope type   [facility] Jerrol Banana., MD      Future Appointments            In 6 months Stoioff, Ronda Fairly, MD [place]

## 2021-02-09 ENCOUNTER — Telehealth: Payer: Self-pay

## 2021-02-09 NOTE — Telephone Encounter (Signed)
Cardiology has that--I do not

## 2021-02-09 NOTE — Telephone Encounter (Signed)
Please advise 

## 2021-02-09 NOTE — Telephone Encounter (Signed)
Copied from [place] [id number]. Topic: General - Other >> Feb 09, 2021 10:07 AM Leward Quan A wrote: Reason for CRM: Patient called in to inquire of Dr Rosanna Randy that the heart monitor test he had a week ago still he is waiting on a call with the result. Can be reached at Ph# [phone] or [phone]

## 2021-02-10 NOTE — Telephone Encounter (Signed)
Returned call and person who answered stated we were calling the wrong number.

## 2021-03-07 ENCOUNTER — Other Ambulatory Visit: Payer: Self-pay | Admitting: Family Medicine

## 2021-03-07 NOTE — Telephone Encounter (Signed)
Requested Prescriptions  Pending Prescriptions Disp Refills  . dutasteride (AVODART) 0.5 MG capsule [Pharmacy Med Name: Dutasteride 0.5 MG Oral Capsule] 90 capsule 0    Sig: Take 1 capsule by mouth once daily     Urology: 5-alpha Reductase Inhibitors Passed - 03/07/2021  1:52 PM      Passed - Valid encounter within last 12 months    Recent Outpatient Visits          3 months ago Hypoglycemia   [facility] Jerrol Banana., MD   4 months ago No-show for appointment   [place], [place]   4 months ago Encounter for annual wellness visit (AWV) in Medicare patient   [facility] Jerrol Banana., [place]   7 months ago Mixed hyperlipidemia   [facility] Jerrol Banana., [place]   8 months ago Syncope, unspecified syncope type   [facility] Jerrol Banana., MD      Future Appointments            In 5 months Stoioff, Ronda Fairly, MD [place]           . omeprazole (PRILOSEC) 40 MG capsule [Pharmacy Med Name: Omeprazole 40 MG Oral Capsule Delayed Release] 90 capsule 0    Sig: Take 1 capsule by mouth once daily     Gastroenterology: Proton Pump Inhibitors Passed - 03/07/2021  1:52 PM      Passed - Valid encounter within last 12 months    Recent Outpatient Visits          3 months ago Hypoglycemia   [facility] Jerrol Banana., MD   4 months ago No-show for appointment   [place], [place]   4 months ago Encounter for annual wellness visit (AWV) in Medicare patient   [facility] Jerrol Banana., MD   7 months ago Mixed hyperlipidemia   [facility] Jerrol Banana., MD   8 months ago Syncope, unspecified syncope type   [facility] Jerrol Banana., MD      Future Appointments            In 5 months Stoioff, Ronda Fairly, MD [place]

## 2021-04-02 ENCOUNTER — Other Ambulatory Visit: Payer: Self-pay | Admitting: Family Medicine

## 2021-04-02 DIAGNOSIS — F32A Depression, unspecified: Secondary | ICD-10-CM

## 2021-04-02 DIAGNOSIS — F324 Major depressive disorder, single episode, in partial remission: Secondary | ICD-10-CM

## 2021-04-02 NOTE — Telephone Encounter (Signed)
Notes to clinic:  The original prescription was discontinued on 11/20/2020 by Enzo Bi, MD for the following reason: Reorder. Renewing this prescription may not be appropriate.   Requested Prescriptions  Pending Prescriptions Disp Refills   diltiazem (CARDIZEM CD) 120 MG 24 hr capsule [Pharmacy Med Name: dilTIAZem HCl ER Coated Beads 120 MG Oral Capsule Extended Release 24 Hour] 90 capsule 0    Sig: Take 1 capsule by mouth once daily      Cardiovascular:  Calcium Channel Blockers Failed - 04/02/2021  1:16 PM      Failed - Last BP in normal range    BP Readings from Last 1 Encounters:  12/27/20 (!) 160/93          Passed - Valid encounter within last 6 months    Recent Outpatient Visits           4 months ago Hypoglycemia   [facility] Jerrol Banana., MD   5 months ago No-show for appointment   [facility] [address] Carles Collet M, [place]   5 months ago Encounter for annual wellness visit (AWV) in Medicare patient   [facility] Rosanna Randy, Retia Passe., MD   8 months ago Mixed hyperlipidemia   [facility] Jerrol Banana., MD   9 months ago Syncope, unspecified syncope type   [facility] Jerrol Banana., MD       Future Appointments             In 4 months Stoioff, Ronda Fairly, MD [facility]              Signed Prescriptions Disp Refills   venlafaxine XR (EFFEXOR-XR) 150 MG 24 hr capsule 90 capsule 0    Sig: TAKE 1 CAPSULE BY MOUTH ONCE DAILY WITH BREAKFAST      Psychiatry: Antidepressants - SNRI - desvenlafaxine & venlafaxine Failed - 04/02/2021  1:16 PM      Failed - Triglycerides in normal range and within 360 days    Triglycerides  Date Value Ref Range Status  07/22/2020 180 (H) 0 - 149 mg/dL Final          Failed - Last BP in normal range    BP Readings from Last 1 Encounters:  12/27/20 (!) 160/93          Passed - LDL in normal range and within 360  days    LDL Chol Calc (NIH)  Date Value Ref Range Status  07/22/2020 64 0 - 99 mg/dL Final          Passed - Total Cholesterol in normal range and within 360 days    Cholesterol, Total  Date Value Ref Range Status  07/22/2020 139 100 - 199 mg/dL Final          Passed - Completed PHQ-2 or PHQ-9 in the last 360 days      Passed - Valid encounter within last 6 months    Recent Outpatient Visits           4 months ago Hypoglycemia   [facility] Jerrol Banana., MD   5 months ago No-show for appointment   [facility], Wendee Beavers, [place]   5 months ago Encounter for annual wellness visit (AWV) in Medicare patient   [facility] Jerrol Banana., MD   8 months ago Mixed hyperlipidemia   [facility] Jerrol Banana., MD  9 months ago Syncope, unspecified syncope type   [facility] Jerrol Banana., MD       Future Appointments             In 4 months Stoioff, Ronda Fairly, MD [facility]               sertraline (ZOLOFT) 100 MG tablet 90 tablet 0    Sig: Take 1 tablet by mouth once daily      Psychiatry:  Antidepressants - SSRI Passed - 04/02/2021  1:16 PM      Passed - Completed PHQ-2 or PHQ-9 in the last 360 days      Passed - Valid encounter within last 6 months    Recent Outpatient Visits           4 months ago Hypoglycemia   [facility] Jerrol Banana., MD   5 months ago No-show for appointment   [facility] [place], Fabio Bering M, [place]   5 months ago Encounter for annual wellness visit (AWV) in Medicare patient   [facility] Jerrol Banana., MD   8 months ago Mixed hyperlipidemia   [facility] Jerrol Banana., MD   9 months ago Syncope, unspecified syncope type   [facility] Jerrol Banana., MD       Future Appointments             In 4  months Stoioff, Ronda Fairly, MD [place]

## 2021-04-15 ENCOUNTER — Ambulatory Visit: Payer: Self-pay

## 2021-04-15 NOTE — Telephone Encounter (Signed)
FYI. Thanks.

## 2021-04-15 NOTE — Telephone Encounter (Signed)
Spoke with patient on the phone he denied un URI like symptoms or fever or chest pain, he states for the past 2 nights he has experienced shortness of breath when laying down and states that in 2017 he had experienced this same problem and was hospitalized, patient denies shortness of breath on exertion . Advised office visit for evaluation. Contacted [facility] and scheduled appointment tomorrow at 1:40 with Dr. Neomia Dear

## 2021-04-15 NOTE — Telephone Encounter (Signed)
Patient called and says for the past 2 nights he's been having SOB when sleeping and laying flat, not able to sleep. He says when he's resting on the couch, he can sleep no problems with SOB. He says this happened in 2017 and he was in the hospital for 5 days. He denies swelling, no other symptoms. I advised no openings with PCP until August 15th, placed patient on hold to call the office. I spoke to Dale, [facility] who asked to speak to the patient for options at [facility]. Patient connected to [facility] successfully.  Reason for Disposition  [1] MODERATE longstanding difficulty breathing (e.g., speaks in phrases, SOB even at rest, pulse 100-120) AND [2] SAME as normal  Answer Assessment - Initial Assessment Questions 1. RESPIRATORY STATUS: "Describe your breathing?" (e.g., wheezing, shortness of breath, unable to speak, severe coughing)      Shortness of breath at night 2. ONSET: "When did this breathing problem begin?"      2 nights ago 3. PATTERN "Does the difficult breathing come and go, or has it been constant since it started?"      Only at night when lying flat 4. SEVERITY: "How bad is your breathing?" (e.g., mild, moderate, severe)    - MILD: No SOB at rest, mild SOB with walking, speaks normally in sentences, can lie down, no retractions, pulse < 100.    - MODERATE: SOB at rest, SOB with minimal exertion and prefers to sit, cannot lie down flat, speaks in phrases, mild retractions, audible wheezing, pulse 100-120.    - SEVERE: Very SOB at rest, speaks in single words, struggling to breathe, sitting hunched forward, retractions, pulse > 120      Only when lying down 5. RECURRENT SYMPTOM: "Have you had difficulty breathing before?" If Yes, ask: "When was the last time?" and "What happened that time?"      Yes in 2017 I was hospitalized x 5 days 6. CARDIAC HISTORY: "Do you have any history of heart disease?" (e.g., heart attack, angina, bypass surgery, angioplasty)      No 7. LUNG HISTORY: "Do  you have any history of lung disease?"  (e.g., pulmonary embolus, asthma, emphysema)     No 8. CAUSE: "What do you think is causing the breathing problem?"      I think it may be fluid, but no sure 9. OTHER SYMPTOMS: "Do you have any other symptoms? (e.g., dizziness, runny nose, cough, chest pain, fever)     No 10. O2 SATURATION MONITOR:  "Do you use an oxygen saturation monitor (pulse oximeter) at home?" If Yes, "What is your reading (oxygen level) today?" "What is your usual oxygen saturation reading?" (e.g., 95%)       N/A 11. PREGNANCY: "Is there any chance you are pregnant?" "When was your last menstrual period?"       N/A 12. TRAVEL: "Have you traveled out of the country in the last month?" (e.g., travel history, exposures)       No  Protocols used: Breathing Difficulty-A-AH

## 2021-04-16 ENCOUNTER — Encounter: Payer: Self-pay | Admitting: Internal Medicine

## 2021-04-16 ENCOUNTER — Ambulatory Visit: Payer: Medicare Other | Admitting: Internal Medicine

## 2021-04-16 ENCOUNTER — Ambulatory Visit (INDEPENDENT_AMBULATORY_CARE_PROVIDER_SITE_OTHER): Payer: Medicare Other | Admitting: Internal Medicine

## 2021-04-16 ENCOUNTER — Other Ambulatory Visit: Payer: Self-pay

## 2021-04-16 VITALS — BP 127/79 | HR 69 | Temp 99.1°F | Ht 69.02 in | Wt 204.6 lb

## 2021-04-16 DIAGNOSIS — E876 Hypokalemia: Secondary | ICD-10-CM | POA: Diagnosis not present

## 2021-04-16 MED ORDER — FUROSEMIDE 40 MG PO TABS
40.0000 mg | ORAL_TABLET | Freq: Every day | ORAL | 0 refills | Status: DC
Start: 2021-04-16 — End: 2021-10-29

## 2021-04-16 NOTE — Progress Notes (Signed)
BP 127/79   Pulse 69   Temp 99.1 F (37.3 C) (Oral)   Ht 5' 9.02" (1.753 m)   Wt 204 lb 9.6 oz (92.8 kg)   SpO2 99%   BMI 30.20 kg/m    Subjective:    Patient ID: Tom Moore, male    DOB: 1946/01/26, 75 y.o.   MRN: [id number]  Chief Complaint  Patient presents with  . Shortness of Breath    Happens  when he lays down at night.    HPI: Tom Moore is a 75 y.o. male  Pt Is here fro [place], says he had SOB 2017 first in [place] where he is from, went to the ER there and had CHF and was given lasix.  had been rx lasix 2018   Shortness of Breath This is a recurrent problem. The current episode started in the past 7 days.   Chief Complaint  Patient presents with  . Shortness of Breath    Happens  when he lays down at night.    Relevant past medical, surgical, family and social history reviewed and updated as indicated. Interim medical history since our last visit reviewed. Allergies and medications reviewed and updated.  Review of Systems  Respiratory:  Positive for shortness of breath.    Per HPI unless specifically indicated above     Objective:    BP 127/79   Pulse 69   Temp 99.1 F (37.3 C) (Oral)   Ht 5' 9.02" (1.753 m)   Wt 204 lb 9.6 oz (92.8 kg)   SpO2 99%   BMI 30.20 kg/m   Wt Readings from Last 3 Encounters:  04/16/21 204 lb 9.6 oz (92.8 kg)  12/27/20 210 lb 6.4 oz (95.4 kg)  12/03/20 224 lb (101.6 kg)    Physical Exam Vitals and nursing note reviewed.  Constitutional:      General: He is not in acute distress.    Appearance: Normal appearance. He is not ill-appearing or diaphoretic.  HENT:     Head: Normocephalic and atraumatic.     Right Ear: Tympanic membrane and external ear normal. There is no impacted cerumen.     Left Ear: External ear normal.     Nose: No congestion or rhinorrhea.     Mouth/Throat:     Pharynx: No oropharyngeal exudate or posterior oropharyngeal erythema.  Eyes:     Conjunctiva/sclera: Conjunctivae normal.      Pupils: Pupils are equal, round, and reactive to light.  Cardiovascular:     Rate and Rhythm: Normal rate and regular rhythm.     Heart sounds: No murmur heard.   No friction rub. No gallop.  Pulmonary:     Effort: No respiratory distress.     Breath sounds: No stridor. No wheezing or rhonchi.  Chest:     Chest wall: No tenderness.  Abdominal:     General: Abdomen is flat. Bowel sounds are normal.     Palpations: Abdomen is soft. There is no mass.     Tenderness: There is no abdominal tenderness.  Musculoskeletal:     Cervical back: Normal range of motion and neck supple. No rigidity or tenderness.     Left lower leg: No edema.  Neurological:     Mental Status: He is alert.   Results for orders placed or performed during the hospital encounter of 12/26/20  Resp Panel by RT-PCR (Flu A&B, Covid) Nasopharyngeal Swab   Specimen: Nasopharyngeal Swab; Nasopharyngeal(NP) swabs in vial transport medium  Result Value Ref Range   SARS Coronavirus 2 by RT PCR NEGATIVE NEGATIVE   Influenza A by PCR NEGATIVE NEGATIVE   Influenza B by PCR NEGATIVE NEGATIVE  CBC with Differential  Result Value Ref Range   WBC 5.7 4.0 - 10.5 K/uL   RBC 4.81 4.22 - 5.81 MIL/uL   Hemoglobin 14.7 13.0 - 17.0 g/dL   HCT 43.4 39.0 - 52.0 %   MCV 90.2 80.0 - 100.0 fL   MCH 30.6 26.0 - 34.0 pg   MCHC 33.9 30.0 - 36.0 g/dL   RDW 13.8 11.5 - 15.5 %   Platelets 180 150 - 400 K/uL   nRBC 0.0 0.0 - 0.2 %   Neutrophils Relative % 63 %   Neutro Abs 3.7 1.7 - 7.7 K/uL   Lymphocytes Relative 23 %   Lymphs Abs 1.3 0.7 - 4.0 K/uL   Monocytes Relative 9 %   Monocytes Absolute 0.5 0.1 - 1.0 K/uL   Eosinophils Relative 3 %   Eosinophils Absolute 0.2 0.0 - 0.5 K/uL   Basophils Relative 1 %   Basophils Absolute 0.0 0.0 - 0.1 K/uL   Immature Granulocytes 1 %   Abs Immature Granulocytes 0.03 0.00 - 0.07 K/uL  Comprehensive metabolic panel  Result Value Ref Range   Sodium 142 135 - 145 mmol/L   Potassium 3.8 3.5 - 5.1  mmol/L   Chloride 105 98 - 111 mmol/L   CO2 26 22 - 32 mmol/L   Glucose, Bld 125 (H) 70 - 99 mg/dL   BUN 22 8 - 23 mg/dL   Creatinine, Ser 1.73 (H) 0.61 - 1.24 mg/dL   Calcium 9.3 8.9 - 10.3 mg/dL   Total Protein 7.4 6.5 - 8.1 g/dL   Albumin 4.1 3.5 - 5.0 g/dL   AST 83 (H) 15 - 41 U/L   ALT 133 (H) 0 - 44 U/L   Alkaline Phosphatase 95 38 - 126 U/L   Total Bilirubin 1.1 0.3 - 1.2 mg/dL   GFR, Estimated 41 (L) >60 mL/min   Anion gap 11 5 - 15  Brain natriuretic peptide  Result Value Ref Range   B Natriuretic Peptide 229.9 (H) 0.0 - 100.0 pg/mL  Magnesium  Result Value Ref Range   Magnesium 1.7 1.7 - 2.4 mg/dL  TSH  Result Value Ref Range   TSH 1.529 0.350 - 4.500 uIU/mL  Hepatitis panel, acute  Result Value Ref Range   Hepatitis B Surface Ag NON REACTIVE NON REACTIVE   HCV Ab NON REACTIVE NON REACTIVE   Hep A IgM NON REACTIVE NON REACTIVE   Hep B C IgM NON REACTIVE NON REACTIVE  Vitamin B12  Result Value Ref Range   Vitamin B-12 412 180 - 914 pg/mL  Methylmalonic acid, serum  Result Value Ref Range   Methylmalonic Acid, Quantitative 257 0 - 378 nmol/L  Folate, serum, performed at [facility] lab  Result Value Ref Range   Folate 28.0 >5.9 ng/mL  Glucose, capillary  Result Value Ref Range   Glucose-Capillary 112 (H) 70 - 99 mg/dL  Comprehensive metabolic panel  Result Value Ref Range   Sodium 139 135 - 145 mmol/L   Potassium 3.2 (L) 3.5 - 5.1 mmol/L   Chloride 105 98 - 111 mmol/L   CO2 24 22 - 32 mmol/L   Glucose, Bld 78 70 - 99 mg/dL   BUN 20 8 - 23 mg/dL   Creatinine, Ser 1.29 (H) 0.61 - 1.24 mg/dL   Calcium 8.8 (L)  8.9 - 10.3 mg/dL   Total Protein 6.5 6.5 - 8.1 g/dL   Albumin 3.6 3.5 - 5.0 g/dL   AST 85 (H) 15 - 41 U/L   ALT 123 (H) 0 - 44 U/L   Alkaline Phosphatase 76 38 - 126 U/L   Total Bilirubin 1.1 0.3 - 1.2 mg/dL   GFR, Estimated 59 (L) >60 mL/min   Anion gap 10 5 - 15  CBC  Result Value Ref Range   WBC 5.6 4.0 - 10.5 K/uL   RBC 4.46 4.22 - 5.81  MIL/uL   Hemoglobin 14.1 13.0 - 17.0 g/dL   HCT 40.5 39.0 - 52.0 %   MCV 90.8 80.0 - 100.0 fL   MCH 31.6 26.0 - 34.0 pg   MCHC 34.8 30.0 - 36.0 g/dL   RDW 13.9 11.5 - 15.5 %   Platelets 158 150 - 400 K/uL   nRBC 0.0 0.0 - 0.2 %  Glucose, capillary  Result Value Ref Range   Glucose-Capillary 107 (H) 70 - 99 mg/dL  Glucose, capillary  Result Value Ref Range   Glucose-Capillary 90 70 - 99 mg/dL  Troponin I (High Sensitivity)  Result Value Ref Range   Troponin I (High Sensitivity) 21 (H) <18 ng/L  Troponin I (High Sensitivity)  Result Value Ref Range   Troponin I (High Sensitivity) 16 <18 ng/L        Current Outpatient Medications:  .  atorvastatin (LIPITOR) 40 MG tablet, Take 40 mg by mouth daily., Disp: , Rfl:  .  diltiazem (CARDIZEM CD) 120 MG 24 hr capsule, Take 1 capsule by mouth once daily, Disp: 90 capsule, Rfl: 0 .  Dulaglutide (TRULICITY) 1.5 JY/7.8GN SOPN, Inject 1.5 mg into the skin once a week. Mondays, Disp: , Rfl:  .  dutasteride (AVODART) 0.5 MG capsule, Take 1 capsule by mouth once daily, Disp: 90 capsule, Rfl: 0 .  furosemide (LASIX) 20 MG tablet, Take by mouth., Disp: , Rfl:  .  insulin NPH-regular Human (70-30) 100 UNIT/ML injection, Inject 20 Units into the skin 2 (two) times daily with a meal., Disp: 10 mL, Rfl: 11 .  metFORMIN (GLUCOPHAGE) 1000 MG tablet, TAKE 1 TABLET BY MOUTH TWICE DAILY WITH A MEAL (Patient taking differently: Take 1,000 mg by mouth in the morning and at bedtime.), Disp: 180 tablet, Rfl: 0 .  metoprolol tartrate (LOPRESSOR) 25 MG tablet, Take 25 mg by mouth 2 (two) times daily., Disp: , Rfl:  .  Multiple Vitamin (MULTIVITAMIN WITH MINERALS) TABS tablet, Take 1 tablet by mouth daily., Disp: , Rfl:  .  omeprazole (PRILOSEC) 40 MG capsule, Take 1 capsule by mouth once daily, Disp: 90 capsule, Rfl: 0 .  PACERONE 200 MG tablet, Take 200 mg by mouth daily., Disp: , Rfl:  .  sacubitril-valsartan (ENTRESTO) 24-26 MG, Take 1 tablet by mouth daily.,  Disp: , Rfl:  .  sertraline (ZOLOFT) 100 MG tablet, Take 1 tablet by mouth once daily, Disp: 90 tablet, Rfl: 0 .  sucralfate (CARAFATE) 1 g tablet, TAKE 1 TABLET BY MOUTH THREE TIMES DAILY WITH MEALS, Disp: 90 tablet, Rfl: 2 .  tamsulosin (FLOMAX) 0.4 MG CAPS capsule, TAKE 2 CAPSULES BY MOUTH ONCE DAILY WITH LUNCH (Patient taking differently: Take 0.8 mg by mouth daily with lunch.), Disp: 180 capsule, Rfl: 0 .  venlafaxine XR (EFFEXOR-XR) 150 MG 24 hr capsule, TAKE 1 CAPSULE BY MOUTH ONCE DAILY WITH BREAKFAST, Disp: 90 capsule, Rfl: 0 .  buPROPion (WELLBUTRIN SR) 150 MG 12 hr tablet,  Take 1 tablet by mouth twice daily, Disp: 180 tablet, Rfl: 0 .  methimazole (TAPAZOLE) 5 MG tablet, Take 5 mg by mouth. (Patient not taking: Reported on 04/16/2021), Disp: , Rfl:     Assessment & Plan:  SOB / orthopnea  / has a ho CHF  Will increase lasix to 40 mg.  Is also on enteresto for CHF per dr. Donnamarie Rossetti notes. 'needs to see Dr. Jerrye Beavers asap  Pt wants to switch to me for his PCP  Will need to schedule appt in 3-4  weeks.  Problem List Items Addressed This Visit   None    No orders of the defined types were placed in this encounter.    No orders of the defined types were placed in this encounter.    Follow up plan: No follow-ups on file.

## 2021-04-17 DIAGNOSIS — M1990 Unspecified osteoarthritis, unspecified site: Secondary | ICD-10-CM

## 2021-04-17 DIAGNOSIS — I4891 Unspecified atrial fibrillation: Secondary | ICD-10-CM

## 2021-04-17 DIAGNOSIS — G252 Other specified forms of tremor: Secondary | ICD-10-CM

## 2021-04-17 DIAGNOSIS — F32A Depression, unspecified: Secondary | ICD-10-CM

## 2021-04-17 DIAGNOSIS — I5022 Chronic systolic (congestive) heart failure: Secondary | ICD-10-CM

## 2021-04-17 DIAGNOSIS — F419 Anxiety disorder, unspecified: Secondary | ICD-10-CM

## 2021-04-17 DIAGNOSIS — M48061 Spinal stenosis, lumbar region without neurogenic claudication: Secondary | ICD-10-CM

## 2021-04-17 DIAGNOSIS — K219 Gastro-esophageal reflux disease without esophagitis: Secondary | ICD-10-CM

## 2021-04-17 DIAGNOSIS — E119 Type 2 diabetes mellitus without complications: Secondary | ICD-10-CM

## 2021-04-17 DIAGNOSIS — I110 Hypertensive heart disease with heart failure: Secondary | ICD-10-CM

## 2021-04-17 LAB — CMP14+EGFR
ALT: 88 IU/L — ABNORMAL HIGH (ref 0–44)
AST: 59 IU/L — ABNORMAL HIGH (ref 0–40)
Albumin/Globulin Ratio: 1.6 (ref 1.2–2.2)
Albumin: 4.2 g/dL (ref 3.7–4.7)
Alkaline Phosphatase: 176 IU/L — ABNORMAL HIGH (ref 44–121)
BUN/Creatinine Ratio: 16 (ref 10–24)
BUN: 22 mg/dL (ref 8–27)
Bilirubin Total: 0.3 mg/dL (ref 0.0–1.2)
CO2: 28 mmol/L (ref 20–29)
Calcium: 9.4 mg/dL (ref 8.6–10.2)
Chloride: 100 mmol/L (ref 96–106)
Creatinine, Ser: 1.38 mg/dL — ABNORMAL HIGH (ref 0.76–1.27)
Globulin, Total: 2.7 g/dL (ref 1.5–4.5)
Glucose: 115 mg/dL — ABNORMAL HIGH (ref 65–99)
Potassium: 4.9 mmol/L (ref 3.5–5.2)
Sodium: 143 mmol/L (ref 134–144)
Total Protein: 6.9 g/dL (ref 6.0–8.5)
eGFR: 54 mL/min/{1.73_m2} — ABNORMAL LOW (ref >59)

## 2021-04-17 NOTE — Progress Notes (Signed)
Liver enzymes elevated - pl see if he has any Right upper quadrant pain - also he is on 3 psych meds - can you clarify if he sees psych - will need to recheck before next visit /  A1c, CMP with GFR

## 2021-05-04 ENCOUNTER — Other Ambulatory Visit: Payer: Self-pay | Admitting: Family Medicine

## 2021-05-04 DIAGNOSIS — F32A Depression, unspecified: Secondary | ICD-10-CM

## 2021-05-04 DIAGNOSIS — F324 Major depressive disorder, single episode, in partial remission: Secondary | ICD-10-CM

## 2021-05-04 NOTE — Telephone Encounter (Signed)
Both meds filled last RF 04/02/21 #90

## 2021-05-21 ENCOUNTER — Ambulatory Visit: Payer: Medicare Other | Admitting: Internal Medicine

## 2021-05-26 ENCOUNTER — Other Ambulatory Visit: Payer: Self-pay | Admitting: Family Medicine

## 2021-05-26 ENCOUNTER — Other Ambulatory Visit: Payer: Self-pay | Admitting: Urology

## 2021-05-26 DIAGNOSIS — R141 Gas pain: Secondary | ICD-10-CM

## 2021-05-26 DIAGNOSIS — R110 Nausea: Secondary | ICD-10-CM

## 2021-06-15 ENCOUNTER — Telehealth: Payer: Self-pay

## 2021-06-15 NOTE — Telephone Encounter (Signed)
Copied from [place] [id number]. Topic: Appointment Scheduling - Scheduling Inquiry for Clinic >> Jun 15, 2021 10:15 AM Rayann Heman wrote: Reason for CRM: Pt states that he has ear pain and stomach pain. Pt would like to know if he can be worked in today. Please advise

## 2021-06-18 ENCOUNTER — Other Ambulatory Visit: Payer: Self-pay | Admitting: Family Medicine

## 2021-06-18 ENCOUNTER — Encounter: Payer: Self-pay | Admitting: Family Medicine

## 2021-06-18 ENCOUNTER — Telehealth (INDEPENDENT_AMBULATORY_CARE_PROVIDER_SITE_OTHER): Payer: Medicare Other | Admitting: Family Medicine

## 2021-06-18 VITALS — BP 115/77 | HR 88 | Temp 97.9°F | Resp 16 | Ht 69.0 in | Wt 205.0 lb

## 2021-06-18 DIAGNOSIS — G4700 Insomnia, unspecified: Secondary | ICD-10-CM

## 2021-06-18 DIAGNOSIS — I5020 Unspecified systolic (congestive) heart failure: Secondary | ICD-10-CM

## 2021-06-18 DIAGNOSIS — R197 Diarrhea, unspecified: Secondary | ICD-10-CM

## 2021-06-18 DIAGNOSIS — J309 Allergic rhinitis, unspecified: Secondary | ICD-10-CM

## 2021-06-18 DIAGNOSIS — I480 Paroxysmal atrial fibrillation: Secondary | ICD-10-CM

## 2021-06-18 DIAGNOSIS — F324 Major depressive disorder, single episode, in partial remission: Secondary | ICD-10-CM

## 2021-06-18 DIAGNOSIS — E1122 Type 2 diabetes mellitus with diabetic chronic kidney disease: Secondary | ICD-10-CM

## 2021-06-18 DIAGNOSIS — N183 Chronic kidney disease, stage 3 unspecified: Secondary | ICD-10-CM

## 2021-06-18 DIAGNOSIS — F419 Anxiety disorder, unspecified: Secondary | ICD-10-CM

## 2021-06-18 DIAGNOSIS — R1084 Generalized abdominal pain: Secondary | ICD-10-CM

## 2021-06-18 DIAGNOSIS — H9202 Otalgia, left ear: Secondary | ICD-10-CM

## 2021-06-18 MED ORDER — CETIRIZINE HCL 10 MG PO TABS: 10.0000 mg | ORAL_TABLET | Freq: Every day | ORAL | 2 refills | Status: DC

## 2021-06-18 NOTE — Patient Instructions (Signed)
TRY METAMUCIL AND PROBIOTIC DAILY. STOP EFFEXOR/VENLAFAXINE.

## 2021-06-18 NOTE — Progress Notes (Deleted)
MyChart Video Visit    Virtual Visit via Video Note   This visit type was conducted due to national recommendations for restrictions regarding the COVID-19 Pandemic (e.g. social distancing) in an effort to limit this patient's exposure and mitigate transmission in our community. This patient is at least at moderate risk for complications without adequate follow up. This format is felt to be most appropriate for this patient at this time. Physical exam was limited by quality of the video and audio technology used for the visit.   Patient location: *** Provider location: ***  I discussed the limitations of evaluation and management by telemedicine and the availability of in person appointments. The patient expressed understanding and agreed to proceed.  Patient: Tom Moore   DOB: 1945-12-04   75 y.o. Male  MRN: [id number] Visit Date: 06/18/2021  Today's healthcare provider: [name], MD   No chief complaint on file.  Subjective    HPI  ***   Medications: Outpatient Medications Prior to Visit  Medication Sig   atorvastatin (LIPITOR) 40 MG tablet Take 40 mg by mouth daily.   buPROPion (WELLBUTRIN SR) 150 MG 12 hr tablet Take 1 tablet by mouth twice daily   diltiazem (CARDIZEM CD) 120 MG 24 hr capsule Take 1 capsule by mouth once daily   Dulaglutide (TRULICITY) 1.5 PO/2.4MP SOPN Inject 1.5 mg into the skin once a week. Mondays   dutasteride (AVODART) 0.5 MG capsule Take 1 capsule by mouth once daily   furosemide (LASIX) 40 MG tablet Take 1 tablet (40 mg total) by mouth daily.   insulin NPH-regular Human (70-30) 100 UNIT/ML injection Inject 20 Units into the skin 2 (two) times daily with a meal.   metFORMIN (GLUCOPHAGE) 1000 MG tablet TAKE 1 TABLET BY MOUTH TWICE DAILY WITH A MEAL (Patient taking differently: Take 1,000 mg by mouth in the morning and at bedtime.)   methimazole (TAPAZOLE) 5 MG tablet Take 5 mg by mouth. (Patient not taking: Reported on 04/16/2021)    metoprolol tartrate (LOPRESSOR) 25 MG tablet Take 25 mg by mouth 2 (two) times daily.   Multiple Vitamin (MULTIVITAMIN WITH MINERALS) TABS tablet Take 1 tablet by mouth daily.   omeprazole (PRILOSEC) 40 MG capsule Take 1 capsule by mouth once daily   PACERONE 200 MG tablet Take 200 mg by mouth daily.   sacubitril-valsartan (ENTRESTO) 24-26 MG Take 1 tablet by mouth daily.   sertraline (ZOLOFT) 100 MG tablet Take 1 tablet by mouth once daily   sucralfate (CARAFATE) 1 g tablet TAKE 1 TABLET BY MOUTH THREE TIMES DAILY WITH MEALS   tamsulosin (FLOMAX) 0.4 MG CAPS capsule Take 2 capsules (0.8 mg total) by mouth daily with lunch.   venlafaxine XR (EFFEXOR-XR) 150 MG 24 hr capsule TAKE 1 CAPSULE BY MOUTH ONCE DAILY WITH BREAKFAST   No facility-administered medications prior to visit.    Review of Systems  {Labs  Heme  Chem  Endocrine  Serology  Results Review (optional):23779}  Objective    There were no vitals taken for this visit. {Show previous vital signs (optional):23777}  Physical Exam     Assessment & Plan     ***  No follow-ups on file.     I discussed the assessment and treatment plan with the patient. The patient was provided an opportunity to ask questions and all were answered. The patient agreed with the plan and demonstrated an understanding of the instructions.   The patient was advised to call back or  seek an in-person evaluation if the symptoms worsen or if the condition fails to improve as anticipated.  I provided *** minutes of non-face-to-face time during this encounter.  {provider attestation***:1}  [name], MD [facility] [phone] (phone) [phone] (fax)  [place]

## 2021-06-18 NOTE — Progress Notes (Signed)
Established patient visit   Patient: Tom Moore   DOB: 10-21-1945   75 y.o. Male  MRN: [id number] Visit Date: 06/18/2021  Today's healthcare provider: [name], MD   Chief Complaint  Patient presents with   Abdominal Pain   Ear Pain   Subjective    Otalgia  There is pain in the left ear. This is a new problem. The current episode started in the past 7 days (1 week). The problem has been unchanged. There has been no fever. The pain is moderate. Associated symptoms include diarrhea and rhinorrhea. Pertinent negatives include no abdominal pain, coughing, ear discharge, headaches, hearing loss, neck pain, rash, sore throat or vomiting. He has tried nothing for the symptoms.     Patient states he has had left ear pain for one week. Patient also has intermittent runny nose. Patient also states he has had diarrhea for 2 weeks. Patient states he has around 3-4 bowel movements a day. Diarrhea will wake patient up from sleep. Patient states stool is watery.  He is feeling much better than he was earlier in the year.  The ear pain comes and goes on the left ear and it itches in the ear. He has had intermittent "diarrhea" for about 2 weeks.  He has 2-3 episodes per day.    Medications: Outpatient Medications Prior to Visit  Medication Sig   atorvastatin (LIPITOR) 40 MG tablet Take 40 mg by mouth daily.   diltiazem (CARDIZEM CD) 120 MG 24 hr capsule Take 1 capsule by mouth once daily   Dulaglutide (TRULICITY) 1.5 OX/7.3ZH SOPN Inject 1.5 mg into the skin once a week. Mondays   dutasteride (AVODART) 0.5 MG capsule Take 1 capsule by mouth once daily   furosemide (LASIX) 40 MG tablet Take 1 tablet (40 mg total) by mouth daily.   insulin NPH-regular Human (70-30) 100 UNIT/ML injection Inject 20 Units into the skin 2 (two) times daily with a meal.   metFORMIN (GLUCOPHAGE) 1000 MG tablet TAKE 1 TABLET BY MOUTH TWICE DAILY WITH A MEAL (Patient taking differently: Take 1,000 mg by  mouth in the morning and at bedtime.)   methimazole (TAPAZOLE) 5 MG tablet Take 5 mg by mouth.   metoprolol tartrate (LOPRESSOR) 25 MG tablet Take 25 mg by mouth 2 (two) times daily.   Multiple Vitamin (MULTIVITAMIN WITH MINERALS) TABS tablet Take 1 tablet by mouth daily.   omeprazole (PRILOSEC) 40 MG capsule Take 1 capsule by mouth once daily   sertraline (ZOLOFT) 100 MG tablet Take 1 tablet by mouth once daily   tamsulosin (FLOMAX) 0.4 MG CAPS capsule Take 2 capsules (0.8 mg total) by mouth daily with lunch.   venlafaxine XR (EFFEXOR-XR) 150 MG 24 hr capsule TAKE 1 CAPSULE BY MOUTH ONCE DAILY WITH BREAKFAST   buPROPion (WELLBUTRIN SR) 150 MG 12 hr tablet Take 1 tablet by mouth twice daily (Patient not taking: Reported on 06/18/2021)   PACERONE 200 MG tablet Take 200 mg by mouth daily.   sacubitril-valsartan (ENTRESTO) 24-26 MG Take 1 tablet by mouth daily. (Patient not taking: Reported on 06/18/2021)   sucralfate (CARAFATE) 1 g tablet TAKE 1 TABLET BY MOUTH THREE TIMES DAILY WITH MEALS (Patient not taking: Reported on 06/18/2021)   No facility-administered medications prior to visit.    Review of Systems  Constitutional:  Negative for appetite change, chills and fever.  HENT:  Positive for ear pain and rhinorrhea. Negative for ear discharge, hearing loss and sore throat.  Respiratory:  Negative for cough, chest tightness, shortness of breath and wheezing.   Cardiovascular:  Negative for chest pain and palpitations.  Gastrointestinal:  Positive for diarrhea. Negative for abdominal pain, nausea and vomiting.  Musculoskeletal:  Negative for neck pain.  Skin:  Negative for rash.  Neurological:  Negative for headaches.       Objective    BP 115/77 (BP Location: Left Arm, Patient Position: Sitting, Cuff Size: Large)   Pulse 88   Temp 97.9 F (36.6 C) (Temporal)   Resp 16   Ht 5\' 9"  (1.753 m)   Wt 205 lb (93 kg)   SpO2 97%   BMI 30.27 kg/m  BP Readings from Last 3 Encounters:   06/18/21 115/77  04/16/21 127/79  12/27/20 (!) 160/93   Wt Readings from Last 3 Encounters:  06/18/21 205 lb (93 kg)  04/16/21 204 lb 9.6 oz (92.8 kg)  12/27/20 210 lb 6.4 oz (95.4 kg)      Physical Exam Vitals and nursing note reviewed.  Constitutional:      Appearance: Normal appearance. He is normal weight.  HENT:     Head: Normocephalic and atraumatic.     Right Ear: External ear normal.     Left Ear: External ear normal.  Eyes:     General: No scleral icterus.    Conjunctiva/sclera: Conjunctivae normal.  Cardiovascular:     Rate and Rhythm: Normal rate and regular rhythm.     Pulses: Normal pulses.     Heart sounds: Normal heart sounds.  Pulmonary:     Effort: Pulmonary effort is normal.     Breath sounds: Normal breath sounds.  Abdominal:     General: There is no distension.     Palpations: Abdomen is soft.     Tenderness: There is no abdominal tenderness.  Genitourinary:    Penis: Normal.   Musculoskeletal:     Cervical back: Normal range of motion and neck supple.  Skin:    General: Skin is warm and dry.  Neurological:     General: No focal deficit present.     Mental Status: He is alert and oriented to person, place, and time.     Gait: Gait normal.  Psychiatric:        Mood and Affect: Mood normal.        Behavior: Behavior normal.        Thought Content: Thought content normal.        Judgment: Judgment normal.      No results found for any visits on 06/18/21.  Assessment & Plan     1. Controlled type 2 diabetes mellitus with stage 3 chronic kidney disease, unspecified whether long term insulin use (HCC)  - CBC w/Diff/Platelet - Comprehensive Metabolic Panel (CMET)  2. Diarrhea, unspecified type Will try Metamucil daily and then a probiotic daily.  May need GI referral but I think this will be a self-limiting problem. - CBC w/Diff/Platelet - Comprehensive Metabolic Panel (CMET)  3. Insomnia, unspecified type  - CBC w/Diff/Platelet -  Comprehensive Metabolic Panel (CMET)  4. Generalized abdominal pain Has above with diarrhea treatment - CBC w/Diff/Platelet - Comprehensive Metabolic Panel (CMET)  5. Left ear pain I think this is mainly allergic and may be recent URI with ear discomfort. - CBC w/Diff/Platelet - Comprehensive Metabolic Panel (CMET)  6. Allergic rhinitis, unspecified seasonality, unspecified trigger  - cetirizine (ZYRTEC) 10 MG tablet; Take 1 tablet (10 mg total) by mouth daily.  Dispense: 30 tablet; Refill:  2 - CBC w/Diff/Platelet - Comprehensive Metabolic Panel (CMET)   7. Heart failure with reduced ejection fraction ([name]) Patient off Entresto.  8. AF (paroxysmal atrial fibrillation) ([place])   9. CKD stage 3 due to type 2 diabetes mellitus ([place])   10. Major depressive disorder with single episode, in partial remission ([place]) At this time after discussion with patient we will DC venlafaxine.  He is also on sertraline.  11. Anxiety Continue sertraline   Return in about 4 months (around 10/18/2021).      I, [name], MD, have reviewed all documentation for this visit. The documentation on 06/21/21 for the exam, diagnosis, procedures, and orders are all accurate and complete.    Nimah Uphoff Cranford Mon, MD  [facility] [phone] (phone) [phone] (fax)  [place]

## 2021-06-19 LAB — CBC WITH DIFFERENTIAL/PLATELET
Basophils Absolute: 0.1 10*3/uL (ref 0.0–0.2)
Basos: 1 %
EOS (ABSOLUTE): 0.4 10*3/uL (ref 0.0–0.4)
Eos: 5 %
Hematocrit: 43.1 % (ref 37.5–51.0)
Hemoglobin: 14.4 g/dL (ref 13.0–17.7)
Immature Grans (Abs): 0 10*3/uL (ref 0.0–0.1)
Immature Granulocytes: 0 %
Lymphocytes Absolute: 2.4 10*3/uL (ref 0.7–3.1)
Lymphs: 29 %
MCH: 30.6 pg (ref 26.6–33.0)
MCHC: 33.4 g/dL (ref 31.5–35.7)
MCV: 92 fL (ref 79–97)
Monocytes Absolute: 0.6 10*3/uL (ref 0.1–0.9)
Monocytes: 7 %
Neutrophils Absolute: 4.7 10*3/uL (ref 1.4–7.0)
Neutrophils: 58 %
Platelets: 198 10*3/uL (ref 150–450)
RBC: 4.71 x10E6/uL (ref 4.14–5.80)
RDW: 13 % (ref 11.6–15.4)
WBC: 8.2 10*3/uL (ref 3.4–10.8)

## 2021-06-19 LAB — COMPREHENSIVE METABOLIC PANEL
ALT: 56 IU/L — ABNORMAL HIGH (ref 0–44)
AST: 49 IU/L — ABNORMAL HIGH (ref 0–40)
Albumin/Globulin Ratio: 2.1 (ref 1.2–2.2)
Albumin: 4.9 g/dL — ABNORMAL HIGH (ref 3.7–4.7)
Alkaline Phosphatase: 127 IU/L — ABNORMAL HIGH (ref 44–121)
BUN/Creatinine Ratio: 17 (ref 10–24)
BUN: 25 mg/dL (ref 8–27)
Bilirubin Total: 0.4 mg/dL (ref 0.0–1.2)
CO2: 21 mmol/L (ref 20–29)
Calcium: 9.6 mg/dL (ref 8.6–10.2)
Chloride: 105 mmol/L (ref 96–106)
Creatinine, Ser: 1.51 mg/dL — ABNORMAL HIGH (ref 0.76–1.27)
Globulin, Total: 2.3 g/dL (ref 1.5–4.5)
Glucose: 74 mg/dL (ref 65–99)
Potassium: 4.8 mmol/L (ref 3.5–5.2)
Sodium: 145 mmol/L — ABNORMAL HIGH (ref 134–144)
Total Protein: 7.2 g/dL (ref 6.0–8.5)
eGFR: 48 mL/min/{1.73_m2} — ABNORMAL LOW (ref >59)

## 2021-07-15 ENCOUNTER — Encounter: Payer: Self-pay | Admitting: Ophthalmology

## 2021-07-15 ENCOUNTER — Other Ambulatory Visit: Payer: Self-pay | Admitting: Family Medicine

## 2021-07-15 DIAGNOSIS — R42 Dizziness and giddiness: Secondary | ICD-10-CM

## 2021-07-21 NOTE — Discharge Instructions (Signed)

## 2021-07-22 ENCOUNTER — Ambulatory Visit: Payer: Medicare Other | Admitting: Anesthesiology

## 2021-07-22 ENCOUNTER — Encounter
Admission: RE | Disposition: A | Discharge status: Home / Self Care | Payer: Self-pay | Source: Home / Self Care | Attending: Ophthalmology

## 2021-07-22 ENCOUNTER — Encounter: Payer: Self-pay | Admitting: Ophthalmology

## 2021-07-22 ENCOUNTER — Ambulatory Visit
Admission: RE | Admit: 2021-07-22 | Discharge: 2021-07-22 | Disposition: A | Discharge status: Home / Self Care | Payer: Medicare Other | Attending: Ophthalmology | Admitting: Ophthalmology

## 2021-07-22 ENCOUNTER — Other Ambulatory Visit: Payer: Self-pay

## 2021-07-22 DIAGNOSIS — E1136 Type 2 diabetes mellitus with diabetic cataract: Secondary | ICD-10-CM | POA: Diagnosis not present

## 2021-07-22 DIAGNOSIS — Z794 Long term (current) use of insulin: Secondary | ICD-10-CM | POA: Insufficient documentation

## 2021-07-22 DIAGNOSIS — Z888 Allergy status to other drugs, medicaments and biological substances status: Secondary | ICD-10-CM | POA: Insufficient documentation

## 2021-07-22 DIAGNOSIS — Z7984 Long term (current) use of oral hypoglycemic drugs: Secondary | ICD-10-CM | POA: Diagnosis not present

## 2021-07-22 DIAGNOSIS — H2512 Age-related nuclear cataract, left eye: Secondary | ICD-10-CM | POA: Diagnosis present

## 2021-07-22 DIAGNOSIS — E1140 Type 2 diabetes mellitus with diabetic neuropathy, unspecified: Secondary | ICD-10-CM | POA: Insufficient documentation

## 2021-07-22 DIAGNOSIS — Z7985 Long-term (current) use of injectable non-insulin antidiabetic drugs: Secondary | ICD-10-CM | POA: Diagnosis not present

## 2021-07-22 DIAGNOSIS — Z87891 Personal history of nicotine dependence: Secondary | ICD-10-CM | POA: Insufficient documentation

## 2021-07-22 DIAGNOSIS — Z79899 Other long term (current) drug therapy: Secondary | ICD-10-CM | POA: Insufficient documentation

## 2021-07-22 HISTORY — PX: CATARACT EXTRACTION W/PHACO: SHX586

## 2021-07-22 HISTORY — DX: Presence of dental prosthetic device (complete) (partial): Z97.2

## 2021-07-22 LAB — GLUCOSE, CAPILLARY
Glucose-Capillary: 95 mg/dL (ref 70–99)
Glucose-Capillary: 102 mg/dL — ABNORMAL HIGH (ref 70–99)

## 2021-07-22 SURGERY — PHACOEMULSIFICATION, CATARACT, WITH IOL INSERTION
Anesthesia: Monitor Anesthesia Care | Site: Eye | Laterality: Left

## 2021-07-22 MED ORDER — FENTANYL CITRATE (PF) 100 MCG/2ML IJ SOLN
INTRAMUSCULAR | Status: DC | PRN
  Administered 2021-07-22: 50 ug via INTRAVENOUS

## 2021-07-22 MED ORDER — SIGHTPATH DOSE#1 BSS IO SOLN
INTRAOCULAR | Status: DC | PRN
  Administered 2021-07-22: 15 mL via INTRAOCULAR

## 2021-07-22 MED ORDER — SIGHTPATH DOSE#1 BSS IO SOLN
INTRAOCULAR | Status: DC | PRN
  Administered 2021-07-22: 64 mL via OPHTHALMIC

## 2021-07-22 MED ORDER — NEOMYCIN-POLYMYXIN-DEXAMETH 3.5-10000-0.1 OP OINT
TOPICAL_OINTMENT | OPHTHALMIC | Status: DC | PRN
  Administered 2021-07-22: 1 via OPHTHALMIC

## 2021-07-22 MED ORDER — LACTATED RINGERS IV SOLN: INTRAVENOUS | Status: DC

## 2021-07-22 MED ORDER — SIGHTPATH DOSE#1 NA HYALUR & NA CHOND-NA HYALUR IO KIT
PACK | INTRAOCULAR | Status: DC | PRN
  Administered 2021-07-22: 1 via OPHTHALMIC

## 2021-07-22 MED ORDER — MIDAZOLAM HCL 2 MG/2ML IJ SOLN
INTRAMUSCULAR | Status: DC | PRN
  Administered 2021-07-22: 1 mg via INTRAVENOUS

## 2021-07-22 MED ORDER — BRIMONIDINE TARTRATE-TIMOLOL 0.2-0.5 % OP SOLN
OPHTHALMIC | Status: DC | PRN
  Administered 2021-07-22: 1 [drp] via OPHTHALMIC

## 2021-07-22 MED ORDER — TETRACAINE HCL 0.5 % OP SOLN
1.0000 [drp] | OPHTHALMIC | Status: DC | PRN
  Administered 2021-07-22 (×3): 1 [drp] via OPHTHALMIC

## 2021-07-22 MED ORDER — ARMC OPHTHALMIC DILATING DROPS
1.0000 "application " | OPHTHALMIC | Status: DC | PRN
  Administered 2021-07-22 (×3): 1 via OPHTHALMIC

## 2021-07-22 MED ORDER — SIGHTPATH DOSE#1 BSS IO SOLN
INTRAOCULAR | Status: DC | PRN
  Administered 2021-07-22: 2 mL

## 2021-07-22 MED ORDER — METOPROLOL TARTRATE 5 MG/5ML IV SOLN
2.0000 mg | Freq: Once | INTRAVENOUS | Status: AC
  Administered 2021-07-22: 2 mg via INTRAVENOUS

## 2021-07-22 MED ORDER — CEFUROXIME OPHTHALMIC INJECTION 1 MG/0.1 ML
INJECTION | OPHTHALMIC | Status: DC | PRN
  Administered 2021-07-22: 0.1 mL via INTRACAMERAL

## 2021-07-22 SURGICAL SUPPLY — 19 items
CANNULA ANT/CHMB 27G (MISCELLANEOUS) IMPLANT
CANNULA ANT/CHMB 27GA (MISCELLANEOUS) IMPLANT
GLOVE SRG 8 PF TXTR STRL LF DI (GLOVE) ×1 IMPLANT
GLOVE SURG ENC TEXT LTX SZ7.5 (GLOVE) ×2 IMPLANT
GLOVE SURG UNDER POLY LF SZ8 (GLOVE) ×2
GOWN STRL REUS W/ TWL LRG LVL3 (GOWN DISPOSABLE) ×2 IMPLANT
GOWN STRL REUS W/TWL LRG LVL3 (GOWN DISPOSABLE) ×4
LENS IOL TECNIS EYHANCE 18.5 (Intraocular Lens) ×1 IMPLANT
MARKER SKIN DUAL TIP RULER LAB (MISCELLANEOUS) ×2 IMPLANT
NDL CAPSULORHEX 25GA (NEEDLE) IMPLANT
NDL FILTER BLUNT 18X1 1/2 (NEEDLE) ×2 IMPLANT
NEEDLE CAPSULORHEX 25GA (NEEDLE) IMPLANT
NEEDLE FILTER BLUNT 18X 1/2SAF (NEEDLE) ×2
NEEDLE FILTER BLUNT 18X1 1/2 (NEEDLE) ×2 IMPLANT
PACK EYE AFTER SURG (MISCELLANEOUS) IMPLANT
SYR 3ML LL SCALE MARK (SYRINGE) ×4 IMPLANT
SYR TB 1ML LUER SLIP (SYRINGE) ×2 IMPLANT
WATER STERILE IRR 250ML POUR (IV SOLUTION) ×2 IMPLANT
WIPE NON LINTING 3.25X3.25 (MISCELLANEOUS) ×2 IMPLANT

## 2021-07-22 NOTE — H&P (Signed)
[facility]   Primary Care Physician:  Jerrol Banana., MD Ophthalmologist: Dr. Leandrew Koyanagi  Pre-Procedure History & Physical: HPI:  Tom Moore is a 75 y.o. male here for ophthalmic surgery.   Past Medical History:  Diagnosis Date   Anxiety    Arthritis    CHF (congestive heart failure) ([place])    Depression    Diabetes mellitus without complication (HCC)    Dysrhythmia    atrial fib   GERD (gastroesophageal reflux disease)    Hyperlipidemia    Hypertension    Hyperthyroidism    Insomnia    Neuromuscular disorder ([place])    diabetic neuropathy   Orthopnea    uses extra pillow to sleep   Sleep apnea    unable to tolerater CPAP   Wears dentures    Full upper, partial lower    Past Surgical History:  Procedure Laterality Date   CARDIAC CATHETERIZATION     CARPAL TUNNEL RELEASE Right 04/10/2018   Procedure: RELEASE MEDIAN NERVE AT CARPAL TUNNEL;  Surgeon: Earnestine Leys, MD;  Location: [facility] OR[facility];  Service: Orthopedics;  Laterality: Right;   CARPAL TUNNEL RELEASE Left 05/08/2018   Procedure: CARPAL TUNNEL RELEASE;  Surgeon: Earnestine Leys, MD;  Location: [facility];  Service: Orthopedics;  Laterality: Left;   CATARACT EXTRACTION W/PHACO Right 08/16/2019   Procedure: CATARACT EXTRACTION PHACO AND INTRAOCULAR LENS PLACEMENT ([place]) RIGHT;  Surgeon: Marchia Meiers, MD;  Location: [facility];  Service: Ophthalmology;  Laterality: Right;  [place] 01:30.3 CDE 13.71 Fluid Pack lot # [id number]   COLONOSCOPY WITH PROPOFOL N/A 06/04/2020   Procedure: COLONOSCOPY WITH PROPOFOL;  Surgeon: Virgel Manifold, MD;  Location: [facility];  Service: Endoscopy;  Laterality: N/A;   ESOPHAGOGASTRODUODENOSCOPY (EGD) WITH PROPOFOL N/A 06/04/2020   Procedure: ESOPHAGOGASTRODUODENOSCOPY (EGD) WITH PROPOFOL;  Surgeon: Virgel Manifold, MD;  Location: [facility];  Service: Endoscopy;  Laterality: N/A;   TONSILLECTOMY      Prior to Admission medications   Medication Sig Start  Date End Date Taking? Authorizing Provider  atorvastatin (LIPITOR) 40 MG tablet Take 40 mg by mouth daily.   Yes [provider]  cetirizine (ZYRTEC) 10 MG tablet Take 1 tablet (10 mg total) by mouth daily. 06/18/21  Yes Jerrol Banana., MD  diltiazem [facility] CD) 120 MG 24 hr capsule Take 1 capsule by mouth once daily 07/15/21  Yes Jerrol Banana., MD  Dulaglutide (TRULICITY) 1.5 OV/7.8HY SOPN Inject 1.5 mg into the skin once a week. Mondays   Yes [provider]  dutasteride (AVODART) 0.5 MG capsule Take 1 capsule by mouth once daily 03/07/21  Yes Jerrol Banana., MD  furosemide (LASIX) 40 MG tablet Take 1 tablet (40 mg total) by mouth daily. 04/16/21 04/16/22 Yes Vigg, Avanti, MD  insulin NPH-regular Human (70-30) 100 UNIT/ML injection Inject 20 Units into the skin 2 (two) times daily with a meal. 11/20/20  Yes Enzo Bi, MD  meclizine (ANTIVERT) 25 MG tablet TAKE 1 TABLET BY MOUTH THREE TIMES DAILY AS NEEDED FOR DIZZINESS 07/15/21  Yes Jerrol Banana., MD  metFORMIN (GLUCOPHAGE) 1000 MG tablet TAKE 1 TABLET BY MOUTH TWICE DAILY WITH A MEAL Patient taking differently: Take 1,000 mg by mouth in the morning and at bedtime. 04/27/16  Yes Carmon Ginsberg, PA  methimazole (TAPAZOLE) 5 MG tablet Take 5 mg by mouth. 02/15/17  Yes [provider]  metoprolol tartrate (LOPRESSOR) 25 MG tablet Take 25 mg by mouth 2 (two) times  daily.   Yes [provider]  Multiple Vitamin (MULTIVITAMIN WITH MINERALS) TABS tablet Take 1 tablet by mouth daily.   Yes [provider]  omeprazole (PRILOSEC) 40 MG capsule Take 1 capsule by mouth once daily 05/26/21  Yes Jerrol Banana., MD  PACERONE 200 MG tablet Take 200 mg by mouth daily. 06/19/20  Yes [provider]  sacubitril-valsartan (ENTRESTO) 24-26 MG Take 1 tablet by mouth daily. 06/03/20  Yes [provider]  sertraline (ZOLOFT) 100 MG tablet Take 1 tablet by mouth once daily  04/02/21  Yes Jerrol Banana., MD  sucralfate (CARAFATE) 1 g tablet TAKE 1 TABLET BY MOUTH THREE TIMES DAILY WITH MEALS 05/26/21  Yes Jerrol Banana., MD  tamsulosin (FLOMAX) 0.4 MG CAPS capsule Take 2 capsules (0.8 mg total) by mouth daily with lunch. 05/29/21  Yes Stoioff, Ronda Fairly, MD  venlafaxine XR (EFFEXOR-XR) 150 MG 24 hr capsule TAKE 1 CAPSULE BY MOUTH ONCE DAILY WITH BREAKFAST 04/02/21  Yes Jerrol Banana., MD    Allergies as of 07/13/2021 - Review Complete 06/18/2021  Allergen Reaction Noted   Zyrtec allergy [cetirizine] Diarrhea 05/20/2015    Family History  Problem Relation Age of Onset   Hypertension Mother    Heart disease Mother     Social History   Socioeconomic History   Marital status: Divorced    Spouse name: Not on file   Number of children: Not on file   Years of education: Not on file   Highest education level: Not on file  Occupational History   Not on file  Tobacco Use   Smoking status: Former    Types: Cigarettes    Quit date: 04/03/1988    Years since quitting: 33.3   Smokeless tobacco: Never  Vaping Use   Vaping Use: Never used  Substance and Sexual Activity   Alcohol use: Not Currently    Comment: Ocassional alcohol use, Drinks wine.   Drug use: Never   Sexual activity: Not Currently  Other Topics Concern   Not on file  Social History Narrative   Not on file   Social Determinants of Health   Financial Resource Strain: Not on file  Food Insecurity: Not on file  Transportation Needs: Not on file  Physical Activity: Not on file  Stress: Not on file  Social Connections: Not on file  Intimate Partner Violence: Not on file    Review of Systems: See HPI, otherwise negative ROS  Physical Exam: BP (!) 120/97   Pulse 95   Temp 97.9 F (36.6 C) (Temporal)   Ht 5\' 9"  (1.753 m)   Wt 94 kg   SpO2 95%   BMI 30.60 kg/m  General:   Alert,  pleasant and cooperative in NAD Head:  Normocephalic and atraumatic. Lungs:  Clear to  auscultation.    Heart:  Regular rate and rhythm. Tachycardic  Impression/Plan: Tom Moore is here for ophthalmic surgery.  Risks, benefits, limitations, and alternatives regarding ophthalmic surgery have been reviewed with the patient.  Questions have been answered.  All parties agreeable.   Leandrew Koyanagi, MD  07/22/2021, 9:15 AM

## 2021-07-22 NOTE — Anesthesia Procedure Notes (Signed)
Procedure Name: MAC Date/Time: 07/22/2021 10:14 AM Performed by: Dionne Bucy, CRNA Pre-anesthesia Checklist: Patient identified, Emergency Drugs available, Suction available, Patient being monitored and Timeout performed Patient Re-evaluated:Patient Re-evaluated prior to induction Oxygen Delivery Method: Nasal cannula Placement Confirmation: positive ETCO2

## 2021-07-22 NOTE — Op Note (Signed)
  OPERATIVE NOTE  Tom Moore [id number] 07/22/2021   PREOPERATIVE DIAGNOSIS:  Nuclear sclerotic cataract left eye. H25.12   POSTOPERATIVE DIAGNOSIS:    Nuclear sclerotic cataract left eye.     PROCEDURE:  Phacoemusification with posterior chamber intraocular lens placement of the left eye  Ultrasound time: Procedure(s) with comments: CATARACT EXTRACTION PHACO AND INTRAOCULAR LENS PLACEMENT (IOC) LEFT DIABETIC 5.29 01:01.6 (Left) - Diabetic  LENS:   Implant Name Type Inv. Item Serial No. Manufacturer Lot No. LRB No. Used Action  LENS IOL TECNIS EYHANCE 18.5 - [id number] Intraocular Lens LENS IOL TECNIS EYHANCE 18.5 [id number] JOHNSON   Left 1 Implanted      SURGEON:  Wyonia Hough, MD   ANESTHESIA:  Topical with tetracaine drops and 2% Xylocaine jelly, augmented with 1% preservative-free intracameral lidocaine.    COMPLICATIONS:  None.   DESCRIPTION OF PROCEDURE:  The patient was identified in the holding room and transported to the operating room and placed in the supine position under the operating microscope.  The left eye was identified as the operative eye and it was prepped and draped in the usual sterile ophthalmic fashion.   A 1 millimeter clear-corneal paracentesis was made at the 1:30 position.  0.5 ml of preservative-free 1% lidocaine was injected into the anterior chamber.  The anterior chamber was filled with Viscoat viscoelastic.  A 2.4 millimeter keratome was used to make a near-clear corneal incision at the 10:30 position.  .  A curvilinear capsulorrhexis was made with a cystotome and capsulorrhexis forceps.  Balanced salt solution was used to hydrodissect and hydrodelineate the nucleus.   Phacoemulsification was then used in stop and chop fashion to remove the lens nucleus and epinucleus.  The remaining cortex was then removed using the irrigation and aspiration handpiece. Provisc was then placed into the capsular bag to distend it for lens placement.  A  lens was then injected into the capsular bag.  The remaining viscoelastic was aspirated.   Wounds were hydrated with balanced salt solution.  The anterior chamber was inflated to a physiologic pressure with balanced salt solution.  No wound leaks were noted. Cefuroxime 0.1 ml of a 10mg /ml solution was injected into the anterior chamber for a dose of 1 mg of intracameral antibiotic at the completion of the case.   Timolol and Brimonidine drops and Maxitrol ointment were applied to the eye.  The patient was taken to the recovery room in stable condition without complications of anesthesia or surgery.  Tom Moore 07/22/2021, 10:36 AM

## 2021-07-22 NOTE — Transfer of Care (Signed)
Immediate Anesthesia Transfer of Care Note  Patient: Tom Moore  Procedure(s) Performed: CATARACT EXTRACTION PHACO AND INTRAOCULAR LENS PLACEMENT (IOC) LEFT DIABETIC 5.29 01:01.6 (Left: Eye)  Patient Location: PACU  Anesthesia Type: MAC  Level of Consciousness: awake, alert  and patient cooperative  Airway and Oxygen Therapy: Patient Spontanous Breathing and Patient connected to supplemental oxygen  Post-op Assessment: Post-op Vital signs reviewed, Patient's Cardiovascular Status Stable, Respiratory Function Stable, Patent Airway and No signs of Nausea or vomiting  Post-op Vital Signs: Reviewed and stable  Complications: No notable events documented.

## 2021-07-22 NOTE — Anesthesia Postprocedure Evaluation (Signed)
Anesthesia Post Note  Patient: Tom Moore  Procedure(s) Performed: CATARACT EXTRACTION PHACO AND INTRAOCULAR LENS PLACEMENT (IOC) LEFT DIABETIC 5.29 01:01.6 (Left: Eye)     Patient location during evaluation: PACU Anesthesia Type: MAC Level of consciousness: awake and alert Pain management: pain level controlled Vital Signs Assessment: post-procedure vital signs reviewed and stable Respiratory status: spontaneous breathing Cardiovascular status: stable Anesthetic complications: no   No notable events documented.  Gillian Scarce

## 2021-07-22 NOTE — Anesthesia Preprocedure Evaluation (Addendum)
Anesthesia Evaluation  Patient identified by MRN, date of birth, ID band Patient awake    Reviewed: Allergy & Precautions, H&P , NPO status , Patient's Chart, lab work & pertinent test results  History of Anesthesia Complications (+) MALIGNANT HYPERTHERMIA  Airway Mallampati: II  TM Distance: >3 FB Neck ROM: full    Dental no notable dental hx.    Pulmonary sleep apnea , former smoker,    Pulmonary exam normal        Cardiovascular hypertension, Pt. on medications +CHF  + dysrhythmias Atrial Fibrillation  Rhythm:regular Rate:Tachycardia  Echocardiogram 2D complete: (11/04/2020) IMPRESSIONS  1. Left ventricular ejection fraction, by estimation, is 65 to 70%. The left ventricle has normal function. The left ventricle has no regional wall motion abnormalities. Left ventricular diastolic parameters are consistent with Grade I diastolic  dysfunction (impaired relaxation).  2. Right ventricular systolic function is normal. The right ventricular size is normal.  3. The mitral valve is normal in structure. No evidence of mitral valve regurgitation.  4. The aortic valve is tricuspid. There is moderate calcification of the aortic valve. There is moderate thickening of the aortic valve. Aortic valve regurgitation is not visualized. Mild to moderate aortic valve sclerosis/calcification is present,  without any evidence of aortic stenosis.     Neuro/Psych Anxiety  Neuromuscular disease    GI/Hepatic Neg liver ROS,   Endo/Other  diabetes, Well Controlled, Type 2  Renal/GU      Musculoskeletal   Abdominal   Peds  Hematology negative hematology ROS (+)   Anesthesia Other Findings   Reproductive/Obstetrics                           Anesthesia Physical Anesthesia Plan  ASA: 3  Anesthesia Plan: MAC   Post-op Pain Management:    Induction:   PONV Risk Score and Plan: 1 and Midazolam and  TIVA  Airway Management Planned:   Additional Equipment:   Intra-op Plan:   Post-operative Plan:   Informed Consent: I have reviewed the patients History and Physical, chart, labs and discussed the procedure including the risks, benefits and alternatives for the proposed anesthesia with the patient or authorized representative who has indicated his/her understanding and acceptance.       Plan Discussed with:   Anesthesia Plan Comments:         Anesthesia Quick Evaluation

## 2021-07-23 ENCOUNTER — Encounter: Payer: Self-pay | Admitting: Ophthalmology

## 2021-08-18 ENCOUNTER — Ambulatory Visit (INDEPENDENT_AMBULATORY_CARE_PROVIDER_SITE_OTHER): Payer: Medicare Other | Admitting: Psychiatry

## 2021-08-18 ENCOUNTER — Other Ambulatory Visit: Payer: Self-pay

## 2021-08-18 ENCOUNTER — Ambulatory Visit: Payer: Self-pay | Admitting: *Deleted

## 2021-08-18 ENCOUNTER — Encounter: Payer: Self-pay | Admitting: Psychiatry

## 2021-08-18 VITALS — BP 145/81 | HR 79 | Temp 98.1°F | Wt 212.8 lb

## 2021-08-18 DIAGNOSIS — F331 Major depressive disorder, recurrent, moderate: Secondary | ICD-10-CM

## 2021-08-18 DIAGNOSIS — Z634 Disappearance and death of family member: Secondary | ICD-10-CM

## 2021-08-18 NOTE — Telephone Encounter (Signed)
Reason for Disposition . [1] MODERATE diarrhea (e.g., 4-6 times / day more than normal) AND [2] present > 48 hours (2 days)  Answer Assessment - Initial Assessment Questions 1. DIARRHEA SEVERITY: "How bad is the diarrhea?" "How many extra stools have you had in the past 24 hours than normal?"    - MILD (Scale 1-3; CTCAE Grade 1): Few loose or mushy BMs; increase of 1-3 stools over normal daily number of stools; mild increase in ostomy output.   - MODERATE (Scale 4-7; CTCAE Grade 2): Increase of 4-6 stools daily over normal; moderate increase in ostomy output.   - SEVERE (Scale 8-10; or worst possible; CTCAE Grade 3): Increase of 7 or more stools daily over normal; moderate increase in ostomy output; incontinence.     Pt calling in c/o diarrhea for a week.    Going 1-2 times a day. 2. ONSET: "When did the diarrhea begin?"      A week ago 3. BM CONSISTENCY: "How loose or watery is the diarrhea?" "Is there any blood in the stool?"     It's very watery and brown and clear. 4. BM ODOR: "Does the stool smell worse or different than usual?"     No 5. VOMITING: "Are you also vomiting?" If Yes, ask: "How many times in the past 24 hours?"      No 6. ABDOMINAL PAIN: "Are you having any abdominal pain?" If Yes, ask: "What does it feel like?" (e.g., crampy, dull, intermittent, constant)      A lot gas.   A little cramping 7. ABDOMINAL PAIN SEVERITY: If present, ask: "How bad is the pain?"  (e.g., Scale 1-10; mild, moderate, or severe)    - MILD (1-3): doesn't interfere with normal activities, abdomen soft and not tender to touch     - MODERATE (4-7): interferes with normal activities or awakens from sleep, abdomen tender to touch     - SEVERE (8-10): excruciating pain, doubled over, unable to do any normal activities       Mild 8. ORAL INTAKE: If vomiting, "Have you been able to drink liquids?" "How much fluids have you had in the past 24 hours?"     Drinking a lot of water but it's going straight  through me. I'm not eating much.  I can eat fruit, apples, peaches.   Back in Feb. I was sick and lost 32 lbs.    9. HYDRATION: "Any signs of dehydration?" (e.g., dry mouth [not just dry lips], too weak to stand, dizziness, new weight loss) "When did you last urinate?"     My mouth is dry, I'm weak,  No dizziness.   I don't know if I've lost weight or not.    Last night went to bed 10:30 I got up 4 times to pee.   I take a fluid pill.   I seltzer water is what I drink. No fever. 10. EXPOSURE: "Have you traveled to a foreign country recently?" "Have you been exposed to anyone with diarrhea?" "Could you have eaten any food that was spoiled?"       No travels or sick exposures. 11. CANCER: "What type of cancer do you have?"       N/A 12. CANCER - TREATMENT: "What cancer treatments have you received?" "When did you last take or receive them?" (e.g., recent surgery, radiation, immunotherapy, or chemotherapy). If triager has access to the patient's medical record, triager should review treatments and administration dates.       N/A  13. CANCER - NEUTROPENIA RISK: "Have you received chemotherapy recently? If Yes, ask: "When was it and what was your last CBC and ANC (absolute neutrophil count)?" "Were you told that your white cell count was low?" If triager has access to the patient's medical record, triager should review most recent labs. An ANC less than 1,000 - 1,500 means that the neutrophils are low and the immune system is weak.       N/A 14. C DIFF RSK: "Have you ever had c-difficile (C-Diff) diarrhea?"        No 15. DIARRHEA MEDICINES: "Are you taking any medicines right now to make the diarrhea better?" If Yes, ask: "What drugs are you taking?" (e.g., Immodium, Lomotil)      I only take what Dr. Rosanna Randy gives to me.   No OTC meds. 16. OTHER SYMPTOMS: "Do you have any other symptoms?" (e.g., fever, blood in stool)       No blood.  Protocols used: Cancer - Diarrhea-A-AH

## 2021-08-18 NOTE — Progress Notes (Deleted)
error 

## 2021-08-18 NOTE — Patient Instructions (Signed)
Managing Loss, Adult °People experience loss in many different ways throughout their lives. Events such as moving, changing jobs, and losing friends can create a sense of loss. The loss may be as serious as a major health change, divorce, death of a pet, or death of a loved one. All of these types of loss are likely to create a physical and emotional reaction known as grief. Grief is the result of a major change or an absence of something or someone that you count on. Grief is a normal reaction to loss. °A variety of factors can affect your grieving experience, including: °The nature of your loss. °Your relationship to what or whom you lost. °Your understanding of grief and how to manage it. °Your support system. °Be aware that when grief becomes extreme, it can lead to more severe issues like isolation, depression, anxiety, or suicidal thoughts. Talk with your health care provider if you have any of these issues. °How to manage lifestyle changes °Keep to your normal routine as much as possible. °If you have trouble focusing or doing normal activities, it is acceptable to take some time away from your normal routine. °Spend time with friends and loved ones. °Eat a healthy diet, get plenty of sleep, and rest when you feel tired. °How to recognize changes  °The way that you deal with your grief will affect your ability to function as you normally do. When grieving, you may experience these changes: °Numbness, shock, sadness, anxiety, anger, denial, and guilt. °Thoughts about death. °Unexpected crying. °A physical sensation of emptiness in your stomach. °Problems sleeping and eating. °Tiredness (fatigue). °Loss of interest in normal activities. °Dreaming about or imagining seeing the person who died. °A need to remember what or whom you lost. °Difficulty thinking about anything other than your loss for a period of time. °Relief. If you have been expecting the loss for a while, you may feel a sense of relief when it  happens. °Follow these instructions at home: °Activity °Express your feelings in healthy ways, such as: °Talking with others about your loss. It may be helpful to find others who have had a similar loss, such as a support group. °Writing down your feelings in a journal. °Doing physical activities to release stress and emotional energy. °Doing creative activities like painting, sculpting, or playing or listening to music. °Practicing resilience. This is the ability to recover and adjust after facing challenges. Reading some resources that encourage resilience may help you to learn ways to practice those behaviors. ° °General instructions °Be patient with yourself and others. Allow the grieving process to happen, and remember that grieving takes time. °It is likely that you may never feel completely done with some grief. You may find a way to move on while still cherishing memories and feelings about your loss. °Accepting your loss is a process. It can take months or longer to adjust. °Keep all follow-up visits. This is important. °Where to find support °To get support for managing loss: °Ask your health care provider for help and recommendations, such as grief counseling or therapy. °Think about joining a support group for people who are managing a loss. °Where to find more information °You can find more information about managing loss from: °American Society of Clinical Oncology: www.cancer.net °American Psychological Association: www.apa.org °Contact a health care provider if: °Your grief is extreme and keeps getting worse. °You have ongoing grief that does not improve. °Your body shows symptoms of grief, such as illness. °You feel depressed, anxious, or   hopeless. °Get help right away if: °You have thoughts about hurting yourself or others. °Get help right away if you feel like you may hurt yourself or others, or have thoughts about taking your own life. Go to your nearest emergency room or: °Call 911. °Call the  National Suicide Prevention Lifeline at 1-800-273-8255 or 988. This is open 24 hours a day. °Text the Crisis Text Line at 741741. °Summary °Grief is the result of a major change or an absence of someone or something that you count on. Grief is a normal reaction to loss. °The depth of grief and the period of recovery depend on the type of loss and your ability to adjust to the change and process your feelings. °Processing grief requires patience and a willingness to accept your feelings and talk about your loss with people who are supportive. °It is important to find resources that work for you and to realize that people experience grief differently. There is not one grieving process that works for everyone in the same way. °Be aware that when grief becomes extreme, it can lead to more severe issues like isolation, depression, anxiety, or suicidal thoughts. Talk with your health care provider if you have any of these issues. °This information is not intended to replace advice given to you by your health care provider. Make sure you discuss any questions you have with your health care provider. °Document Revised: 05/04/2021 Document Reviewed: 05/04/2021 °Elsevier Patient Education © 2022 Elsevier Inc. ° °

## 2021-08-18 NOTE — Progress Notes (Signed)
Psychiatric Initial Adult Assessment   Patient Identification: Tom Moore MRN:  [id number] Date of Evaluation:  08/18/2021 Referral Source: Self Referred Chief Complaint:   Chief Complaint   Establish Care; Depression    Visit Diagnosis:    ICD-10-CM   1. MDD (major depressive disorder), recurrent episode, moderate (HCC)  F33.1     2. Bereavement  Z63.4       History of Present Illness:  Tom Moore is a 75 year old Hispanic male, has a history of depression, multiple medical problems including type 2 diabetes mellitus, stage III chronic kidney disease, hyperthyroidism on methimazole, peripheral artery disease, chronic systolic heart failure, paroxysmal atrial fibrillation, vertigo, syncope and collapse, multiple falls, essential hypertension, mixed hyperlipidemia, gastroesophageal reflux disease, was evaluated in office today.  Spanish interpreter- Milly , assisted in the evaluation today.  Patient reports struggling with depressive symptoms since the past several months.  He reports it may have started several years ago when his son got deployed to [place].  Patient reports that at that time he was living alone however he was able to get a job which kept him busy.  He also was able to spend time with his daughter and grandchildren who moved in with him in in 2014.  Patient however reports 2 years ago he started taking antidepressant, likely prescribed by his primary provider-unable to verbalize.  Patient has tried medications like venlafaxine, sertraline in the past.  Most recently his depressive symptoms started getting worse since 2021/04/26, after his son passed away in a motor vehicle accident.  His son was 40 years old.  Patient continues to grieve his loss and reports he has been having a lot of crying spells.  He describes his depressive symptoms as sadness, low motivation, anhedonia, decreased appetite, weight loss, restless sleep, concentration problems.  Patient recently had  a visit with cardiology-Dr. Clayborn Bigness, per review of notes dated 07/30/2021-he was started on Lexapro 20 mg p.o. daily.  His sertraline was discontinued at that visit.  Patient reports he has not noticed much difference from the Lexapro yet.  It has been 2 weeks since he started this medication.  He does have side effects of diarrhea, unknown if the medication  contributing to it.  He started taking the Lexapro initially at night however recently was switched to the morning due to his diarrhea.  He reports he had a visit with his provider today to discuss the same.  He is agreeable to continuing the Lexapro at this point.  Patient denies any suicidality, homicidality or perceptual disturbances.  Patient denies any manic or hypomanic symptoms.  Patient does report 2-3 episodes of hallucinations when his dead mother came to him and spoke to him.  The last one may have been several months ago.  He currently denies it.  Patient does report relationship struggles, reports he does struggle with a long distance relationship that he had with a younger woman and that kind of is a stressor for him.  He is unable to make a decision about committing to this relationship due to several factors.  That does have an effect on his mood.  Patient appeared to be alert, oriented to person place time and situation and was able to answer questions appropriately.  Patient currently follows up with cardiology as noted above.  Patient also with history of recent falls, syncope, inpatient medical admission-had neurology consultation-with Dr. Manuella Ghazi, reviewed the same dated 03/16/2021-patient with progressive resting tremors, imbalance, cognitive impairment since late 2021, negative evaluation  at [facility] with MRI brain, MRI C-spine, MRI T-spine, MRI L-spine-which showed generalized brain atrophy, moderate white matter microvascular ischemic and metabolic changes.  Questionable amiodarone toxicity.'   Associated  Signs/Symptoms: Depression Symptoms:  depressed mood, anhedonia, insomnia, psychomotor retardation, fatigue, difficulty concentrating, disturbed sleep, weight loss, decreased appetite, (Hypo) Manic Symptoms:   Denies Anxiety Symptoms:   Denies Psychotic Symptoms:   Denies now PTSD Symptoms: Negative  Past Psychiatric History: Patient was started on antidepressants 2 years ago per report-may have tried medications like venlafaxine, sertraline in the past.  Sertraline was discontinued recently per cardiology and was started on Lexapro as noted above.  Patient denies inpatient mental health admissions.  Patient denies suicide attempts.  Previous Psychotropic Medications: Yes sertraline, venlafaxine, Lexapro  Substance Abuse History in the last 12 months:  No.  Consequences of Substance Abuse: Negative  Past Medical History:  Past Medical History:  Diagnosis Date   Anxiety    Arthritis    CHF (congestive heart failure) ([place])    Depression    Diabetes mellitus without complication (HCC)    Dysrhythmia    atrial fib   GERD (gastroesophageal reflux disease)    Hyperlipidemia    Hypertension    Hyperthyroidism    Insomnia    Neuromuscular disorder ([place])    diabetic neuropathy   Orthopnea    uses extra pillow to sleep   Sleep apnea    unable to tolerater CPAP   Wears dentures    Full upper, partial lower    Past Surgical History:  Procedure Laterality Date   CARDIAC CATHETERIZATION     CARPAL TUNNEL RELEASE Right 04/10/2018   Procedure: RELEASE MEDIAN NERVE AT CARPAL TUNNEL;  Surgeon: Earnestine Leys, MD;  Location: [facility];  Service: Orthopedics;  Laterality: Right;   CARPAL TUNNEL RELEASE Left 05/08/2018   Procedure: CARPAL TUNNEL RELEASE;  Surgeon: Earnestine Leys, MD;  Location: [facility];  Service: Orthopedics;  Laterality: Left;   CATARACT EXTRACTION W/PHACO Right 08/16/2019   Procedure: CATARACT EXTRACTION PHACO AND INTRAOCULAR LENS PLACEMENT ([place]) RIGHT;  Surgeon:  Marchia Meiers, MD;  Location: [facility];  Service: Ophthalmology;  Laterality: Right;  [place] 01:30.3 CDE 13.71 Fluid Pack lot # [id number]   CATARACT EXTRACTION W/PHACO Left 07/22/2021   Procedure: CATARACT EXTRACTION PHACO AND INTRAOCULAR LENS PLACEMENT ([facility]) LEFT DIABETIC 5.29 01:01.6;  Surgeon: Leandrew Koyanagi, MD;  Location: [place];  Service: Ophthalmology;  Laterality: Left;  Diabetic   COLONOSCOPY WITH PROPOFOL N/A 06/04/2020   Procedure: COLONOSCOPY WITH PROPOFOL;  Surgeon: Virgel Manifold, MD;  Location: [facility];  Service: Endoscopy;  Laterality: N/A;   ESOPHAGOGASTRODUODENOSCOPY (EGD) WITH PROPOFOL N/A 06/04/2020   Procedure: ESOPHAGOGASTRODUODENOSCOPY (EGD) WITH PROPOFOL;  Surgeon: Virgel Manifold, MD;  Location: [facility];  Service: Endoscopy;  Laterality: N/A;   TONSILLECTOMY      Family Psychiatric History: Patient denies any mental health problems in his family.  Family History:  Family History  Problem Relation Age of Onset   Hypertension Mother    Heart disease Mother    Mental illness Neg Hx     Social History:   Social History   Socioeconomic History   Marital status: Widowed    Spouse name: Not on file   Number of children: 5   Years of education: Not on file   Highest education level: Not on file  Occupational History   Not on file  Tobacco Use   Smoking status: Former    Types: Cigarettes  Quit date: 04/03/1988    Years since quitting: 33.4   Smokeless tobacco: Never  Vaping Use   Vaping Use: Never used  Substance and Sexual Activity   Alcohol use: Not Currently    Comment: Ocassional alcohol use, Drinks wine.   Drug use: Never   Sexual activity: Not Currently  Other Topics Concern   Not on file  Social History Narrative   Not on file   Social Determinants of Health   Financial Resource Strain: Not on file  Food Insecurity: Not on file  Transportation Needs: Not on file  Physical Activity: Not on file  Stress:  Not on file  Social Connections: Not on file    Additional Social History: Patient was born in [place].  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  He was married twice, divorced once, widowed once.  Patient had 5 children altogether, his son passed away, 05-07-2021.  Patient has an okay relationship with his 4 other children who are adults.  His daughter, her husband and patient lives in the same household.  Patient lives in [place].  Patient is currently retired.    Allergies:   Allergies  Allergen Reactions   Bupropion Other (See Comments)    Tremors, balance issues, cognitive impairment   Zyrtec Allergy [Cetirizine] Diarrhea    Metabolic Disorder Labs: Lab Results  Component Value Date   HGBA1C 7.0 (H) 11/04/2020   MPG 154.2 11/04/2020   No results found for: PROLACTIN Lab Results  Component Value Date   CHOL 139 07/22/2020   TRIG 180 (H) 07/22/2020   HDL 45 07/22/2020   CHOLHDL 3.1 07/22/2020   LDLCALC 64 07/22/2020   LDLCALC 47 04/23/2019   Lab Results  Component Value Date   TSH 1.529 12/26/2020    Therapeutic Level Labs: No results found for: LITHIUM No results found for: CBMZ No results found for: VALPROATE  Current Medications: Current Outpatient Medications  Medication Sig Dispense Refill   atorvastatin (LIPITOR) 40 MG tablet Take 40 mg by mouth daily.     cetirizine (ZYRTEC) 10 MG tablet Take 1 tablet (10 mg total) by mouth daily. 30 tablet 2   diltiazem (CARDIZEM CD) 120 MG 24 hr capsule Take 1 capsule by mouth once daily 90 capsule 0   Dulaglutide (TRULICITY) 1.5 NA/3.5TD SOPN Inject 1.5 mg into the skin once a week. Mondays     dutasteride (AVODART) 0.5 MG capsule Take 1 capsule by mouth once daily 90 capsule 0   escitalopram (LEXAPRO) 20 MG tablet Take by mouth.     furosemide (LASIX) 40 MG tablet Take 1 tablet (40 mg total) by mouth daily. 30 tablet 0   insulin NPH Human (NOVOLIN N) 100 UNIT/ML injection Inject into the skin.     insulin  NPH-regular Human (70-30) 100 UNIT/ML injection Inject 20 Units into the skin 2 (two) times daily with a meal. 10 mL 11   meclizine (ANTIVERT) 25 MG tablet TAKE 1 TABLET BY MOUTH THREE TIMES DAILY AS NEEDED FOR DIZZINESS 30 tablet 0   metFORMIN (GLUCOPHAGE) 1000 MG tablet TAKE 1 TABLET BY MOUTH TWICE DAILY WITH A MEAL (Patient taking differently: Take 1,000 mg by mouth in the morning and at bedtime.) 180 tablet 0   methimazole (TAPAZOLE) 5 MG tablet Take 2 tablets by mouth daily.     metoprolol tartrate (LOPRESSOR) 25 MG tablet Take 25 mg by mouth 2 (two) times daily.     Multiple Vitamin (MULTIVITAMIN WITH MINERALS) TABS tablet Take 1  tablet by mouth daily.     omeprazole (PRILOSEC) 40 MG capsule Take 1 capsule by mouth once daily 90 capsule 1   PACERONE 200 MG tablet Take 200 mg by mouth daily.     sacubitril-valsartan (ENTRESTO) 24-26 MG Take 1 tablet by mouth daily.     sucralfate (CARAFATE) 1 g tablet TAKE 1 TABLET BY MOUTH THREE TIMES DAILY WITH MEALS 270 tablet 1   tamsulosin (FLOMAX) 0.4 MG CAPS capsule Take 2 capsules (0.8 mg total) by mouth daily with lunch. 180 capsule 1   No current facility-administered medications for this visit.    Musculoskeletal: Strength & Muscle Tone: within normal limits Gait & Station: normal Patient leans: N/A  Psychiatric Specialty Exam: Review of Systems  Constitutional:  Positive for fatigue.  Gastrointestinal:  Positive for diarrhea.  Psychiatric/Behavioral:  Positive for decreased concentration, dysphoric mood and sleep disturbance.   All other systems reviewed and are negative.  Blood pressure (!) 145/81, pulse 79, temperature 98.1 F (36.7 C), temperature source Temporal, weight 212 lb 12.8 oz (96.5 kg).Body mass index is 31.43 kg/m.  General Appearance: Casual  Eye Contact:  Fair  Speech:  Clear and Coherent  Volume:  Normal  Mood:  Depressed  Affect:  Congruent  Thought Process:  Goal Directed and Descriptions of Associations: Intact   Orientation:  Full (Time, Place, and Person)  Thought Content:  Logical  Suicidal Thoughts:  No  Homicidal Thoughts:  No  Memory:  Immediate;   Fair Recent;   Fair Remote;   Fair  Judgement:  Fair  Insight:  Fair  Psychomotor Activity:  Normal  Concentration:  Concentration: Fair and Attention Span: Fair  Recall:  [employer] [place]: Fair  Akathisia:  No  Handed:  Right  AIMS (if indicated):  not done  Assets:  Communication Skills Desire for Improvement Housing Social Support Transportation Vocational/Educational  ADL's:  Intact  Cognition: WNL  Sleep:   Restless   Screenings: Camera operator Row Office Visit from 08/18/2021 in [place] Visit from 10/22/2020 in [place] Visit from 07/05/2019 in [place] Management from 02/09/2019 in [place] Visit from 01/31/2019 in [place]  PHQ-2 Total Score 6 2 4 1 3   PHQ-9 Total Score 17 8 17  -- 11      Flowsheet Row Admission (Discharged) from 07/22/2021 in [facility] ED to Hosp-Admission (Discharged) from 12/26/2020 in [facility] ED to Hosp-Admission (Discharged) from 11/18/2020 in [place] (1C)  C-SSRS RISK CATEGORY No Risk No Risk No Risk       Assessment and Plan: Tom Moore is a 75 year old Hispanic male, widowed, lives in [place], has a history of depression, multiple medical problems including peripheral artery disease, diabetes mellitus, hypertension, cardiomyopathy, CHF, hypothyroidism, was evaluated in office today.  Patient continues to grieve the loss of his son who passed away a few months ago in a motor vehicle collision.  Patient with recent changes in his medication 2 weeks ago, with possible adverse side effects of diarrhea, will benefit from the following plan. The patient  demonstrates the following risk factors for suicide: Chronic risk factors for suicide include: psychiatric disorder of depression, medical illness hyperthyroidism, CHF, and demographic factors (male, >45 y/o). Acute risk factors for suicide include: loss (financial, interpersonal, professional). Protective factors for this patient include: positive social support and religious  beliefs against suicide. Considering these factors, the overall suicide risk at this point appears to be low. Patient is appropriate for outpatient follow up.   Plan MDD-unstable Lexapro 20 mg p.o. daily. Patient advised to take it with breakfast in the morning. Provided medication education.  If his diarrhea does not get better advised to contact provider. Patient to give this medication more time since it was recently added per cardiology. Patient with sleep issues likely due to his CHF.  Patient to continue to follow up with cardiology for the same.  We will consider adding a sleep aid as needed in the future.  Bereavement-unstable Will refer patient for CBT.  I have communicated with front desk. Provided grief counseling.  I have reviewed notes for neurology-Dr. Manuella Ghazi, cardiology-Dr. Clayborn Bigness,, endocrinology-Dr. Gabriel Carina.  We will coordinate care as needed.  Patient with multiple health problems as noted above.  Labs reviewed-06/22/2021-TSH-low at 0.315-currently under the care of endocrinology.  Follow-up in clinic in 4 weeks or sooner in person.  This note was generated in part or whole with voice recognition software. Voice recognition is usually quite accurate but there are transcription errors that can and very often do occur. I apologize for any typographical errors that were not detected and corrected.         Ursula Alert, MD 11/23/20229:24 AM

## 2021-08-18 NOTE — Telephone Encounter (Signed)
Pt called in c/o having watery brown to clear diarrhea for a week now.    Having a little abd cramping but no other symptoms.   Denies fever, sick contacts, new medications.    He does not want to take any OTC medications for the diarrhea.   "I only take what Dr. Rosanna Randy gives me".     There are no appts with [facility] so I have sent a note to the practice to see if they can work him in.   See triage notes.   Protocol is to be seen within 24 hrs.   I went over the care advice with him.    Notes sent to [facility] for an appt.

## 2021-08-19 NOTE — Telephone Encounter (Signed)
Patient returned call. He was unable to "write down" Dr. Alben Spittle advise, because he was driving. Patient will call back for information. Okay for pec triage to advise patient. Thanks .

## 2021-08-25 ENCOUNTER — Encounter: Payer: Self-pay | Admitting: Physician Assistant

## 2021-08-25 ENCOUNTER — Ambulatory Visit (INDEPENDENT_AMBULATORY_CARE_PROVIDER_SITE_OTHER): Payer: Medicare Other | Admitting: Physician Assistant

## 2021-08-25 ENCOUNTER — Other Ambulatory Visit: Payer: Self-pay

## 2021-08-25 VITALS — BP 118/77 | HR 73 | Temp 97.9°F | Wt 212.0 lb

## 2021-08-25 DIAGNOSIS — R197 Diarrhea, unspecified: Secondary | ICD-10-CM | POA: Diagnosis not present

## 2021-08-25 DIAGNOSIS — F331 Major depressive disorder, recurrent, moderate: Secondary | ICD-10-CM | POA: Diagnosis not present

## 2021-08-25 MED ORDER — CIPROFLOXACIN HCL 500 MG PO TABS: 500.0000 mg | ORAL_TABLET | Freq: Two times a day (BID) | ORAL | 0 refills | Status: DC

## 2021-08-25 MED ORDER — VENLAFAXINE HCL ER 150 MG PO CP24: 150.0000 mg | ORAL_CAPSULE | Freq: Every day | ORAL | 1 refills | Status: DC

## 2021-08-25 NOTE — Progress Notes (Signed)
Established patient visit   Patient: Tom Moore   DOB: 08-31-1946   75 y.o. Male  MRN: [id number] Visit Date: 08/25/2021  Today's healthcare provider: Mikey Kirschner, PA-C   Cc. Diarrhea x 1 week, depression f/u  Subjective    HPI  Tom Moore is a 75 y/o male who presents today with complaints of watery diarrhea x1 week.  He states that every bowel movement he has is completely watery and he now goes 3-4 times a day which is abnormal for him.  He has been compensating with drinking a lot of water and Gatorade.  He has taken Imodium but this did not stop the diarrhea.  Denies any recent travel. Denies bloody stools, abdominal pain, cramping.  He reports recently being switched to Lexapro from Sertraline, but has taken Effexor and Wellbutrin in the past. He felt Sertraline wasn't effective, and Lexapro made him feel overall unwell.  Wellbutrin caused tremors and changes in gait. No reported side effects on Effexor. Medications: Outpatient Medications Prior to Visit  Medication Sig   atorvastatin (LIPITOR) 40 MG tablet Take 40 mg by mouth daily.   cetirizine (ZYRTEC) 10 MG tablet Take 1 tablet (10 mg total) by mouth daily.   diltiazem (CARDIZEM CD) 120 MG 24 hr capsule Take 1 capsule by mouth once daily   Dulaglutide (TRULICITY) 1.5 YB/0.1BP SOPN Inject 1.5 mg into the skin once a week. Mondays   dutasteride (AVODART) 0.5 MG capsule Take 1 capsule by mouth once daily   furosemide (LASIX) 40 MG tablet Take 1 tablet (40 mg total) by mouth daily.   insulin NPH Human (NOVOLIN N) 100 UNIT/ML injection Inject into the skin.   insulin NPH-regular Human (70-30) 100 UNIT/ML injection Inject 20 Units into the skin 2 (two) times daily with a meal.   meclizine (ANTIVERT) 25 MG tablet TAKE 1 TABLET BY MOUTH THREE TIMES DAILY AS NEEDED FOR DIZZINESS   metFORMIN (GLUCOPHAGE) 1000 MG tablet TAKE 1 TABLET BY MOUTH TWICE DAILY WITH A MEAL (Patient taking differently: Take 1,000 mg by mouth in the  morning and at bedtime.)   methimazole (TAPAZOLE) 5 MG tablet Take 2 tablets by mouth daily.   metoprolol tartrate (LOPRESSOR) 25 MG tablet Take 25 mg by mouth 2 (two) times daily.   Multiple Vitamin (MULTIVITAMIN WITH MINERALS) TABS tablet Take 1 tablet by mouth daily.   omeprazole (PRILOSEC) 40 MG capsule Take 1 capsule by mouth once daily   PACERONE 200 MG tablet Take 200 mg by mouth daily.   sacubitril-valsartan (ENTRESTO) 24-26 MG Take 1 tablet by mouth daily.   sucralfate (CARAFATE) 1 g tablet TAKE 1 TABLET BY MOUTH THREE TIMES DAILY WITH MEALS   tamsulosin (FLOMAX) 0.4 MG CAPS capsule Take 2 capsules (0.8 mg total) by mouth daily with lunch.   [DISCONTINUED] escitalopram (LEXAPRO) 20 MG tablet Take by mouth. (Patient not taking: Reported on 08/25/2021)   No facility-administered medications prior to visit.    Review of Systems  Constitutional:  Negative for fatigue and fever.  Gastrointestinal:  Positive for diarrhea. Negative for abdominal pain.  Psychiatric/Behavioral:  Negative for suicidal ideas. The patient is nervous/anxious.    Last CBC Lab Results  Component Value Date   WBC 8.2 06/18/2021   HGB 14.4 06/18/2021   HCT 43.1 06/18/2021   MCV 92 06/18/2021   MCH 30.6 06/18/2021   RDW 13.0 06/18/2021   PLT 198 07/21/8526   Last metabolic panel Lab Results  Component Value Date  GLUCOSE 74 06/18/2021   NA 145 (H) 06/18/2021   K 4.8 06/18/2021   CL 105 06/18/2021   CO2 21 06/18/2021   BUN 25 06/18/2021   CREATININE 1.51 (H) 06/18/2021   EGFR 48 (L) 06/18/2021   CALCIUM 9.6 06/18/2021   PHOS 3.5 02/26/2020   PROT 7.2 06/18/2021   ALBUMIN 4.9 (H) 06/18/2021   LABGLOB 2.3 06/18/2021   AGRATIO 2.1 06/18/2021   BILITOT 0.4 06/18/2021   ALKPHOS 127 (H) 06/18/2021   AST 49 (H) 06/18/2021   ALT 56 (H) 06/18/2021   ANIONGAP 10 12/27/2020   Last lipids Lab Results  Component Value Date   CHOL 139 07/22/2020   HDL 45 07/22/2020   LDLCALC 64 07/22/2020   TRIG  180 (H) 07/22/2020   CHOLHDL 3.1 07/22/2020   Last hemoglobin A1c Lab Results  Component Value Date   HGBA1C 7.0 (H) 11/04/2020   Last thyroid functions Lab Results  Component Value Date   TSH 1.529 12/26/2020   Last vitamin D Lab Results  Component Value Date   VD25OH 21.2 (L) 02/28/2017   Last vitamin B12 and Folate Lab Results  Component Value Date   VITAMINB12 412 12/26/2020   FOLATE 28.0 12/26/2020       Objective    BP 118/77 (BP Location: Left Arm, Patient Position: Sitting, Cuff Size: Large)   Pulse 73   Temp 97.9 F (36.6 C) (Oral)   Wt 212 lb (96.2 kg)   SpO2 99%   BMI 31.31 kg/m  BP Readings from Last 3 Encounters:  08/25/21 118/77  08/18/21 (!) 145/81  07/22/21 (!) 129/94   Wt Readings from Last 3 Encounters:  08/25/21 212 lb (96.2 kg)  08/18/21 212 lb 12.8 oz (96.5 kg)  07/22/21 207 lb 3.2 oz (94 kg)      Physical Exam Constitutional:      General: He is not in acute distress.    Appearance: He is obese.  Cardiovascular:     Rate and Rhythm: Normal rate and regular rhythm.     Pulses: Normal pulses.     Heart sounds: Normal heart sounds.  Pulmonary:     Effort: Pulmonary effort is normal.     Breath sounds: Normal breath sounds.  Abdominal:     General: Bowel sounds are normal. There is no distension.     Palpations: Abdomen is soft. There is no mass.     Tenderness: There is no abdominal tenderness.  Neurological:     Mental Status: He is alert and oriented to person, place, and time.  Psychiatric:        Mood and Affect: Mood normal.        Behavior: Behavior normal.    No results found for any visits on 08/25/21.  Assessment & Plan     Acute diarrhea Could be SE from lexapro, but pt has d/c recently.  Will rx Cipro 500 mg BID x 3 days. Interactions w/ cardiac meds and azithromycin.  Will monitor  Problem List Items Addressed This Visit       Other   Depression    Currently sees a psychiatrist and is working on a  counselor as well Previously not tolerated multiple medications Historically no SE on Effexor, we can try this again.  Encouraged pt to travel next month to [place] to see family.       Relevant Medications   venlafaxine XR (EFFEXOR-XR) 150 MG 24 hr capsule   Other Visit Diagnoses  Diarrhea, unspecified type    -  Primary   Relevant Medications   ciprofloxacin (CIPRO) 500 MG tablet        Return in about 2 months (around 10/25/2021) for depression. Pt also just started following with psychiatrist who can manage meds as well.     I, Mikey Kirschner, PA-C have reviewed all documentation for this visit. The documentation on  08/25/2021 for the exam, diagnosis, procedures, and orders are all accurate and complete.    Mikey Kirschner, PA-C  [facility] [phone] (phone) [phone] (fax)  [place]

## 2021-08-25 NOTE — Assessment & Plan Note (Signed)
Currently sees a psychiatrist and is working on a Social worker as well Previously not tolerated multiple medications Historically no SE on Effexor, we can try this again.  Encouraged pt to travel next month to [place] to see family.

## 2021-08-26 ENCOUNTER — Other Ambulatory Visit: Payer: Self-pay | Admitting: Family Medicine

## 2021-08-26 DIAGNOSIS — R42 Dizziness and giddiness: Secondary | ICD-10-CM

## 2021-08-27 ENCOUNTER — Ambulatory Visit: Payer: Medicare Other | Admitting: Urology

## 2021-08-27 ENCOUNTER — Encounter: Payer: Self-pay | Admitting: Urology

## 2021-08-27 ENCOUNTER — Other Ambulatory Visit: Payer: Self-pay

## 2021-08-27 ENCOUNTER — Ambulatory Visit (INDEPENDENT_AMBULATORY_CARE_PROVIDER_SITE_OTHER): Payer: Medicare Other | Admitting: Licensed Clinical Social Worker

## 2021-08-27 VITALS — BP 133/75 | HR 90 | Ht 69.0 in | Wt 212.0 lb

## 2021-08-27 DIAGNOSIS — N5201 Erectile dysfunction due to arterial insufficiency: Secondary | ICD-10-CM | POA: Diagnosis not present

## 2021-08-27 DIAGNOSIS — Z634 Disappearance and death of family member: Secondary | ICD-10-CM

## 2021-08-27 DIAGNOSIS — F331 Major depressive disorder, recurrent, moderate: Secondary | ICD-10-CM

## 2021-08-27 DIAGNOSIS — N401 Enlarged prostate with lower urinary tract symptoms: Secondary | ICD-10-CM | POA: Diagnosis not present

## 2021-08-27 LAB — MICROSCOPIC EXAMINATION

## 2021-08-27 LAB — URINALYSIS, COMPLETE
Bilirubin, UA: NEGATIVE
Glucose, UA: NEGATIVE
Leukocytes,UA: NEGATIVE
Nitrite, UA: NEGATIVE
RBC, UA: NEGATIVE
Specific Gravity, UA: 1.02 (ref 1.005–1.030)
Urobilinogen, Ur: 1 mg/dL (ref 0.2–1.0)
pH, UA: 5.5 (ref 5.0–7.5)

## 2021-08-27 LAB — BLADDER SCAN AMB NON-IMAGING: Scan Result: 52

## 2021-08-27 MED ORDER — SILDENAFIL CITRATE 100 MG PO TABS: ORAL_TABLET | ORAL | 0 refills | Status: DC

## 2021-08-27 NOTE — Progress Notes (Signed)
08/27/2021 2:40 PM   Tom Moore 1946/05/22 [id number]  Referring provider: Jerrol Banana., MD [address] [place],  [place] [address]  Chief Complaint  Patient presents with   Benign Prostatic Hypertrophy    Urologic history:  1.  BPH with LUTS Combination therapy tamsulosin/dutasteride  2.  Nocturia Previous treatments Nocdurna and IR oxybutynin at bedtime   HPI: 75 y.o. male presents for annual follow-up.  Doing well since last visit No bothersome LUTS Denies dysuria, gross hematuria Denies flank, abdominal or pelvic pain No longer taking IR oxybutynin Remains on tamsulosin/dutasteride New complaint today of the ED Complains of difficulty achieving an erection; does achieve partial erections No pain or curvature Organic risk factors diabetes, hypertension, antihypertensive medication including beta-blockers, previous tobacco history No prior treatment Denies use of oral or sublingual nitrates   PMH: Past Medical History:  Diagnosis Date   Anxiety    Arthritis    CHF (congestive heart failure) (HCC)    Depression    Diabetes mellitus without complication (HCC)    Dysrhythmia    atrial fib   GERD (gastroesophageal reflux disease)    Hyperlipidemia    Hypertension    Hyperthyroidism    Insomnia    Neuromuscular disorder ([place])    diabetic neuropathy   Orthopnea    uses extra pillow to sleep   Sleep apnea    unable to tolerater CPAP   Wears dentures    Full upper, partial lower    Surgical History: Past Surgical History:  Procedure Laterality Date   CARDIAC CATHETERIZATION     CARPAL TUNNEL RELEASE Right 04/10/2018   Procedure: RELEASE MEDIAN NERVE AT CARPAL TUNNEL;  Surgeon: Earnestine Leys, MD;  Location: [facility];  Service: Orthopedics;  Laterality: Right;   CARPAL TUNNEL RELEASE Left 05/08/2018   Procedure: CARPAL TUNNEL RELEASE;  Surgeon: Earnestine Leys, MD;  Location: [facility];  Service: Orthopedics;  Laterality:  Left;   CATARACT EXTRACTION W/PHACO Right 08/16/2019   Procedure: CATARACT EXTRACTION PHACO AND INTRAOCULAR LENS PLACEMENT ([place]) RIGHT;  Surgeon: Marchia Meiers, MD;  Location: [facility];  Service: Ophthalmology;  Laterality: Right;  [place] 01:30.3 CDE 13.71 Fluid Pack lot # [id number]   CATARACT EXTRACTION W/PHACO Left 07/22/2021   Procedure: CATARACT EXTRACTION PHACO AND INTRAOCULAR LENS PLACEMENT (I[facility]) LEFT DIABETIC 5.29 01:01.6;  Surgeon: Leandrew Koyanagi, MD;  Location: [place];  Service: Ophthalmology;  Laterality: Left;  Diabetic   COLONOSCOPY WITH PROPOFOL N/A 06/04/2020   Procedure: COLONOSCOPY WITH PROPOFOL;  Surgeon: Virgel Manifold, MD;  Location: [facility];  Service: Endoscopy;  Laterality: N/A;   ESOPHAGOGASTRODUODENOSCOPY (EGD) WITH PROPOFOL N/A 06/04/2020   Procedure: ESOPHAGOGASTRODUODENOSCOPY (EGD) WITH PROPOFOL;  Surgeon: Virgel Manifold, MD;  Location: [facility];  Service: Endoscopy;  Laterality: N/A;   TONSILLECTOMY      Home Medications:  Allergies as of 08/27/2021       Reactions   Bupropion Other (See Comments)   Tremors, balance issues, cognitive impairment   Zyrtec Allergy [cetirizine] Diarrhea        Medication List        Accurate as of August 27, 2021  2:40 PM. If you have any questions, ask your nurse or doctor.          atorvastatin 40 MG tablet Commonly known as: LIPITOR Take 40 mg by mouth daily.   cetirizine 10 MG tablet Commonly known as: ZYRTEC Take 1 tablet (10 mg total) by mouth daily.   ciprofloxacin 500  MG tablet Commonly known as: Cipro Take 1 tablet (500 mg total) by mouth 2 (two) times daily for 3 days.   diltiazem 120 MG 24 hr capsule Commonly known as: CARDIZEM CD Take 1 capsule by mouth once daily   dutasteride 0.5 MG capsule Commonly known as: AVODART Take 1 capsule by mouth once daily   furosemide 40 MG tablet Commonly known as: LASIX Take 1 tablet (40 mg total) by mouth daily.   insulin  NPH Human 100 UNIT/ML injection Commonly known as: NOVOLIN N Inject into the skin.   insulin NPH-regular Human (70-30) 100 UNIT/ML injection Inject 20 Units into the skin 2 (two) times daily with a meal.   meclizine 25 MG tablet Commonly known as: ANTIVERT TAKE 1 TABLET BY MOUTH THREE TIMES DAILY AS NEEDED FOR DIZZINESS   metFORMIN 1000 MG tablet Commonly known as: GLUCOPHAGE TAKE 1 TABLET BY MOUTH TWICE DAILY WITH A MEAL What changed: when to take this   methimazole 5 MG tablet Commonly known as: TAPAZOLE Take 2 tablets by mouth daily.   metoprolol tartrate 25 MG tablet Commonly known as: LOPRESSOR Take 25 mg by mouth 2 (two) times daily.   multivitamin with minerals Tabs tablet Take 1 tablet by mouth daily.   omeprazole 40 MG capsule Commonly known as: PRILOSEC Take 1 capsule by mouth once daily   Pacerone 200 MG tablet Generic drug: amiodarone Take 200 mg by mouth daily.   sacubitril-valsartan 24-26 MG Commonly known as: ENTRESTO Take 1 tablet by mouth daily.   sucralfate 1 g tablet Commonly known as: CARAFATE TAKE 1 TABLET BY MOUTH THREE TIMES DAILY WITH MEALS   tamsulosin 0.4 MG Caps capsule Commonly known as: FLOMAX Take 2 capsules (0.8 mg total) by mouth daily with lunch.   Trulicity 1.5 CZ/6.6AY Sopn Generic drug: Dulaglutide Inject 1.5 mg into the skin once a week. Mondays   venlafaxine XR 150 MG 24 hr capsule Commonly known as: EFFEXOR-XR Take 1 capsule (150 mg total) by mouth daily.        Allergies:  Allergies  Allergen Reactions   Bupropion Other (See Comments)    Tremors, balance issues, cognitive impairment   Zyrtec Allergy [Cetirizine] Diarrhea    Family History: Family History  Problem Relation Age of Onset   Hypertension Mother    Heart disease Mother    Mental illness Neg Hx     Social History:  reports that he quit smoking about 33 years ago. His smoking use included cigarettes. He has never used smokeless tobacco. He  reports that he does not currently use alcohol. He reports that he does not use drugs.   Physical Exam: BP 133/75   Pulse 90   Ht 5\' 9"  (1.753 m)   Wt 212 lb (96.2 kg)   BMI 31.31 kg/m   Constitutional:  Alert and oriented, No acute distress. HEENT: [place] AT, moist mucus membranes.  Trachea midline, no masses. Cardiovascular: No clubbing, cyanosis, or edema. Respiratory: Normal respiratory effort, no increased work of breathing. GI: Abdomen is soft, nontender, nondistended, no abdominal masses Psychiatric: Normal mood and affect.   Assessment & Plan:    1. Benign prostatic hyperplasia with lower urinary tract symptoms, symptom details unspecified Bladder scan PVR 52 mL Continue dutasteride/tamsulosin Follow-up 1 year or as needed for worsening symptoms  2.  Erectile dysfunction Significant organic risk factors Interested in trial of PDE 5 inhibitor Rx sildenafil 100 mg sent to pharmacy Recommend he attempt at least 5 times to determine efficacy  Although sildenafil and tadalafil have similar efficacy he would be interested in a trial of tadalafil if sildenafil is not effective  Continue annual follow-up   Abbie Sons, MD  [place] [address] [place], [place] [address] [phone]

## 2021-09-03 ENCOUNTER — Ambulatory Visit: Payer: Self-pay | Admitting: *Deleted

## 2021-09-03 ENCOUNTER — Other Ambulatory Visit: Payer: Self-pay | Admitting: Physician Assistant

## 2021-09-03 DIAGNOSIS — R197 Diarrhea, unspecified: Secondary | ICD-10-CM

## 2021-09-03 NOTE — Telephone Encounter (Signed)
He can take immodium, and I will send in a referral to GI

## 2021-09-03 NOTE — Telephone Encounter (Signed)
Called pt let him know that he can take imodium OTC. Pt verbalized understanding.  Pt is aware of GI referral.  KP

## 2021-09-03 NOTE — Telephone Encounter (Signed)
Patient was seen on 11/29 with Ria Comment for diarrhea and treated with Cipro.   Chief Complaint: Diarrhea continues after completing antibiotic Symptoms: only diarrhea Frequency: at least 3-4x daily Pertinent Negatives: Patient denies abdominal pain/fever Disposition: [] ED /[] Urgent Care (no appt availability in office) / [] Appointment(In office/virtual)/ []  [facility]/ [] Home Care/ [] Refused Recommended Disposition  Additional Notes:  Patient did not want advice from nurse. Wants to hear provider's advice and recommendations. He has not used Imodium nor taken probiotic.  Routing to office for review.     Reason for Disposition . Diarrhea continues for > 3 days after stopping the antibiotic  Answer Assessment - Initial Assessment Questions 1. ANTIBIOTIC: "What antibiotic are you taking?" "How many times per day?"     At least 3-4 watery stools 2. ANTIBIOTIC ONSET: "When was the antibiotic started?"     Last week. 3. DIARRHEA SEVERITY: "How bad is the diarrhea?" "How many more stools have you had in the past 24 hours than normal?"    - NO DIARRHEA (SCALE 0)   - MILD (SCALE 1-3): Few loose or mushy BMs; increase of 1-3 stools over normal daily number of stools; mild increase in ostomy output.   -  MODERATE (SCALE 4-7): Increase of 4-6 stools daily over normal; moderate increase in ostomy output. * SEVERE (SCALE 8-10; OR 'WORST POSSIBLE'): Increase of 7 or more stools daily over normal; moderate increase in ostomy output; incontinence.     moderate 4. ONSET: "When did the diarrhea begin?"     Taking the cipro for the diarrhea he was having and the watery stools have continued.  5. BM CONSISTENCY: "How loose or watery is the diarrhea?"      watery 6. VOMITING: "Are you also vomiting?" If Yes, ask: "How many times in the past 24 hours?"      none 7. ABDOMINAL PAIN: "Are you having any abdominal pain?" If Yes, ask: "What does it feel like?" (e.g., crampy, dull, intermittent,  constant)      none 8. ABDOMINAL PAIN SEVERITY: If present, ask: "How bad is the pain?"  (e.g., Scale 1-10; mild, moderate, or severe)   - MILD (1-3): doesn't interfere with normal activities, abdomen soft and not tender to touch    - MODERATE (4-7): interferes with normal activities or awakens from sleep, abdomen tender to touch    - SEVERE (8-10): excruciating pain, doubled over, unable to do any normal activities       No pain reported 9. ORAL INTAKE: If vomiting, "Have you been able to drink liquids?" "How much liquids have you had in the past 24 hours?"      10. HYDRATION: "Any signs of dehydration?" (e.g., dry mouth [not just dry lips], too weak to stand, dizziness, new weight loss) "When did you last urinate?"        11. EXPOSURE: "Have you traveled to a foreign country recently?" "Have you been exposed to anyone with diarrhea?" "Could you have eaten any food that was spoiled?"       na 12. OTHER SYMPTOMS: "Do you have any other symptoms?" (e.g., fever, blood in stool)       denies 13. PREGNANCY: "Is there any chance you are pregnant?" "When was your last menstrual period?"       na  Protocols used: Diarrhea on Antibiotics-A-AH

## 2021-09-03 NOTE — Progress Notes (Signed)
Persistent diarrhea

## 2021-09-08 ENCOUNTER — Other Ambulatory Visit: Payer: Self-pay | Admitting: Urology

## 2021-09-08 ENCOUNTER — Other Ambulatory Visit: Payer: Self-pay | Admitting: Family Medicine

## 2021-09-08 DIAGNOSIS — J309 Allergic rhinitis, unspecified: Secondary | ICD-10-CM

## 2021-09-08 DIAGNOSIS — R141 Gas pain: Secondary | ICD-10-CM

## 2021-09-08 DIAGNOSIS — R110 Nausea: Secondary | ICD-10-CM

## 2021-09-08 DIAGNOSIS — R42 Dizziness and giddiness: Secondary | ICD-10-CM

## 2021-09-10 ENCOUNTER — Other Ambulatory Visit: Payer: Self-pay | Admitting: Physician Assistant

## 2021-09-10 ENCOUNTER — Ambulatory Visit (HOSPITAL_COMMUNITY): Payer: Medicare Other | Admitting: Licensed Clinical Social Worker

## 2021-09-10 ENCOUNTER — Other Ambulatory Visit: Payer: Self-pay

## 2021-09-10 DIAGNOSIS — R197 Diarrhea, unspecified: Secondary | ICD-10-CM

## 2021-09-10 NOTE — Telephone Encounter (Signed)
Pharmacy picked up cipro cancelled

## 2021-09-10 NOTE — Telephone Encounter (Signed)
Accidentally approval of rx. Was a one-time medication . Pt needs to see GI.  Tried calling [facility] and no answer.

## 2021-09-15 ENCOUNTER — Ambulatory Visit (INDEPENDENT_AMBULATORY_CARE_PROVIDER_SITE_OTHER): Payer: Medicare Other | Admitting: Psychiatry

## 2021-09-15 ENCOUNTER — Encounter: Payer: Self-pay | Admitting: Psychiatry

## 2021-09-15 ENCOUNTER — Other Ambulatory Visit: Payer: Self-pay

## 2021-09-15 VITALS — BP 150/79 | HR 79 | Temp 97.8°F | Wt 207.6 lb

## 2021-09-15 DIAGNOSIS — F331 Major depressive disorder, recurrent, moderate: Secondary | ICD-10-CM

## 2021-09-15 DIAGNOSIS — Z634 Disappearance and death of family member: Secondary | ICD-10-CM | POA: Diagnosis not present

## 2021-09-15 DIAGNOSIS — R030 Elevated blood-pressure reading, without diagnosis of hypertension: Secondary | ICD-10-CM | POA: Diagnosis not present

## 2021-09-15 NOTE — Progress Notes (Signed)
[facility] OP Progress Note  09/15/2021 5:03 PM Tom Moore  MRN:  [id number]  Chief Complaint:  Chief Complaint   Follow-up; Depression; Anxiety    HPI: Tom Moore is a 75 year old Hispanic male who has a history of depression, multiple medical problems including type 2 diabetes mellitus, stage III chronic kidney disease, hyperthyroidism on methimazole, peripheral artery disease, chronic systolic heart failure, paroxysmal atrial fibrillation, vertigo, syncope and collapse, multiple falls, essential hypertension, mixed hyperlipidemia, gastroesophageal reflux disease, persistent diarrhea was evaluated in office today.  Patient today reports that he does not need Spanish interpreter .Patient was able to answer questions appropriately.  Patient today reports he continues to be severely depressed.  He has no motivation to do anything and feels tired and sad all the time.  Patient reports he stays home and watches TV.    Patient continues to grieve the loss of his son who passed away a few months ago in a motor vehicle collision.  Patient denies any suicidality, homicidality or perceptual disturbances.  He was recently restarted on Effexor 150 mg a couple of weeks ago by his primary care provider.  Patient reports he stopped taking the Lexapro since his diarrhea continue to get worse.  Although he is currently on the Effexor he continues to have severe diarrhea and is currently undergoing work-up for the same.  Patient is currently in psychotherapy sessions and is motivated to stay in therapy.  Patient denies any other concerns today.  Visit Diagnosis:    ICD-10-CM   1. MDD (major depressive disorder), recurrent episode, moderate (HCC)  F33.1     2. Bereavement  Z63.4     3. Elevated blood pressure reading  R03.0       Past Psychiatric History: I have reviewed past psychiatric history from progress note on 08/18/2021.  Past trials of medications like sertraline, venlafaxine,  Lexapro  Past Medical History:  Past Medical History:  Diagnosis Date   Anxiety    Arthritis    CHF (congestive heart failure) (HCC)    Depression    Diabetes mellitus without complication (HCC)    Dysrhythmia    atrial fib   GERD (gastroesophageal reflux disease)    Hyperlipidemia    Hypertension    Hyperthyroidism    Insomnia    Neuromuscular disorder ([place])    diabetic neuropathy   Orthopnea    uses extra pillow to sleep   Sleep apnea    unable to tolerater CPAP   Wears dentures    Full upper, partial lower    Past Surgical History:  Procedure Laterality Date   CARDIAC CATHETERIZATION     CARPAL TUNNEL RELEASE Right 04/10/2018   Procedure: RELEASE MEDIAN NERVE AT CARPAL TUNNEL;  Surgeon: Earnestine Leys, MD;  Location: [facility];  Service: Orthopedics;  Laterality: Right;   CARPAL TUNNEL RELEASE Left 05/08/2018   Procedure: CARPAL TUNNEL RELEASE;  Surgeon: Earnestine Leys, MD;  Location: [facility];  Service: Orthopedics;  Laterality: Left;   CATARACT EXTRACTION W/PHACO Right 08/16/2019   Procedure: CATARACT EXTRACTION PHACO AND INTRAOCULAR LENS PLACEMENT ([place]) RIGHT;  Surgeon: Marchia Meiers, MD;  Location: [facility];  Service: Ophthalmology;  Laterality: Right;  [place] 01:30.3 CDE 13.71 Fluid Pack lot # [id number]   CATARACT EXTRACTION W/PHACO Left 07/22/2021   Procedure: CATARACT EXTRACTION PHACO AND INTRAOCULAR LENS PLACEMENT ([facility]) LEFT DIABETIC 5.29 01:01.6;  Surgeon: Leandrew Koyanagi, MD;  Location: [facility];  Service: Ophthalmology;  Laterality: Left;  Diabetic   COLONOSCOPY WITH PROPOFOL  N/A 06/04/2020   Procedure: COLONOSCOPY WITH PROPOFOL;  Surgeon: Virgel Manifold, MD;  Location: [facility];  Service: Endoscopy;  Laterality: N/A;   ESOPHAGOGASTRODUODENOSCOPY (EGD) WITH PROPOFOL N/A 06/04/2020   Procedure: ESOPHAGOGASTRODUODENOSCOPY (EGD) WITH PROPOFOL;  Surgeon: Virgel Manifold, MD;  Location: [facility];  Service: Endoscopy;  Laterality: N/A;    TONSILLECTOMY      Family Psychiatric History: Reviewed family psychiatric history from progress note on 08/18/2021  Family History:  Family History  Problem Relation Age of Onset   Hypertension Mother    Heart disease Mother    Mental illness Neg Hx     Social History: Reviewed social history from progress note on 08/18/2021 Social History   Socioeconomic History   Marital status: Widowed    Spouse name: Not on file   Number of children: 5   Years of education: Not on file   Highest education level: Not on file  Occupational History   Not on file  Tobacco Use   Smoking status: Former    Types: Cigarettes    Quit date: 04/03/1988    Years since quitting: 33.4   Smokeless tobacco: Never  Vaping Use   Vaping Use: Never used  Substance and Sexual Activity   Alcohol use: Not Currently    Comment: Ocassional alcohol use, Drinks wine.   Drug use: Never   Sexual activity: Not Currently  Other Topics Concern   Not on file  Social History Narrative   Not on file   Social Determinants of Health   Financial Resource Strain: Not on file  Food Insecurity: Not on file  Transportation Needs: Not on file  Physical Activity: Not on file  Stress: Not on file  Social Connections: Not on file    Allergies:  Allergies  Allergen Reactions   Bupropion Other (See Comments)    Tremors, balance issues, cognitive impairment   Zyrtec Allergy [Cetirizine] Diarrhea    Metabolic Disorder Labs: Lab Results  Component Value Date   HGBA1C 7.0 (H) 11/04/2020   MPG 154.2 11/04/2020   No results found for: PROLACTIN Lab Results  Component Value Date   CHOL 139 07/22/2020   TRIG 180 (H) 07/22/2020   HDL 45 07/22/2020   CHOLHDL 3.1 07/22/2020   LDLCALC 64 07/22/2020   LDLCALC 47 04/23/2019   Lab Results  Component Value Date   TSH 1.529 12/26/2020   TSH 0.326 (L) 11/18/2020    Therapeutic Level Labs: No results found for: LITHIUM No results found for: VALPROATE No  components found for:  CBMZ  Current Medications: Current Outpatient Medications  Medication Sig Dispense Refill   BINAXNOW COVID-19 AG HOME TEST KIT Use as Directed on the Package     diltiazem (CARDIZEM CD) 120 MG 24 hr capsule Take 1 capsule by mouth once daily 90 capsule 1   Dulaglutide (TRULICITY) 1.5 HE/1.7EY SOPN Inject 1.5 mg into the skin once a week. Mondays     furosemide (LASIX) 40 MG tablet Take 1 tablet (40 mg total) by mouth daily. 30 tablet 0   insulin NPH Human (NOVOLIN N) 100 UNIT/ML injection Inject into the skin.     insulin NPH-regular Human (70-30) 100 UNIT/ML injection Inject 20 Units into the skin 2 (two) times daily with a meal. 10 mL 11   metFORMIN (GLUCOPHAGE) 1000 MG tablet TAKE 1 TABLET BY MOUTH TWICE DAILY WITH A MEAL (Patient taking differently: Take 1,000 mg by mouth in the morning and at bedtime.) 180 tablet 0  methimazole (TAPAZOLE) 5 MG tablet Take 2 tablets by mouth daily.     metoprolol succinate (TOPROL-XL) 100 MG 24 hr tablet metoprolol succinate ER 100 mg tablet,extended release 24 hr     Multiple Vitamin (MULTIVITAMIN WITH MINERALS) TABS tablet Take 1 tablet by mouth daily.     omeprazole (PRILOSEC) 40 MG capsule Take 1 capsule by mouth once daily 90 capsule 1   ONETOUCH ULTRA test strip USE 1 STRIP TO CHECK GLUCOSE TWICE DAILY     sacubitril-valsartan (ENTRESTO) 24-26 MG Take 1 tablet by mouth daily.     sildenafil (VIAGRA) 100 MG tablet Take 1 tab 1 hour prior to intercourse 10 tablet 0   venlafaxine XR (EFFEXOR-XR) 150 MG 24 hr capsule Take 1 capsule (150 mg total) by mouth daily. 90 capsule 1   atorvastatin (LIPITOR) 40 MG tablet Take 40 mg by mouth daily. (Patient not taking: Reported on 09/15/2021)     atorvastatin (LIPITOR) 40 MG tablet Take by mouth. (Patient not taking: Reported on 09/15/2021)     Blood Glucose Calibration (OT ULTRA/FASTTK CNTRL SOLN) SOLN See administration instructions. (Patient not taking: Reported on 09/15/2021)      cetirizine (ZYRTEC) 10 MG tablet Take 1 tablet by mouth once daily (Patient not taking: Reported on 09/15/2021) 90 tablet 1   cetirizine (ZYRTEC) 10 MG tablet Take by mouth. (Patient not taking: Reported on 09/15/2021)     ciprofloxacin (CIPRO) 500 MG tablet TAKE 1 TABLET BY MOUTH TWICE DAILY FOR 3 DAYS (Patient not taking: Reported on 09/15/2021) 6 tablet 0   diltiazem (TIAZAC) 120 MG 24 hr capsule Take by mouth. (Patient not taking: Reported on 09/15/2021)     diphenoxylate-atropine (LOMOTIL) 2.5-0.025 MG tablet Take 1 tablet by mouth 4 (four) times daily as needed. (Patient not taking: Reported on 09/15/2021)     dutasteride (AVODART) 0.5 MG capsule Take 1 capsule by mouth once daily (Patient not taking: Reported on 09/15/2021) 90 capsule 0   meclizine (ANTIVERT) 25 MG tablet TAKE 1 TABLET BY MOUTH THREE TIMES DAILY AS NEEDED FOR DIZZINESS (Patient not taking: Reported on 09/15/2021) 90 tablet 1   metFORMIN (GLUCOPHAGE) 1000 MG tablet Take by mouth. (Patient not taking: Reported on 09/15/2021)     metoprolol tartrate (LOPRESSOR) 25 MG tablet Take 25 mg by mouth 2 (two) times daily. (Patient not taking: Reported on 09/15/2021)     omeprazole (PRILOSEC) 40 MG capsule Take by mouth. (Patient not taking: Reported on 09/15/2021)     PACERONE 200 MG tablet Take 200 mg by mouth daily. (Patient not taking: Reported on 09/15/2021)     sucralfate (CARAFATE) 1 g tablet TAKE 1 TABLET BY MOUTH THREE TIMES DAILY WITH MEALS (Patient not taking: Reported on 09/15/2021) 270 tablet 1   tamsulosin (FLOMAX) 0.4 MG CAPS capsule TAKE 2 CAPSULES BY MOUTH ONCE DAILY WITH LUNCH (Patient not taking: Reported on 09/15/2021) 180 capsule 0   traMADol (ULTRAM) 50 MG tablet every 6 (six) hours. (Patient not taking: Reported on 09/15/2021)     No current facility-administered medications for this visit.     Musculoskeletal: Strength & Muscle Tone: within normal limits Gait & Station: normal Patient leans:  N/A  Psychiatric Specialty Exam: Review of Systems  Constitutional:  Positive for appetite change and fatigue.  Gastrointestinal:  Positive for diarrhea.  Psychiatric/Behavioral:  Positive for dysphoric mood and sleep disturbance.   All other systems reviewed and are negative.  Blood pressure (!) 150/79, pulse 79, temperature 97.8 F (36.6 C), temperature source Temporal,  weight 207 lb 9.6 oz (94.2 kg).Body mass index is 30.66 kg/m.  General Appearance: Casual  Eye Contact:  Fair  Speech:  Clear and Coherent  Volume:  Normal  Mood:  Depressed  Affect:  Depressed  Thought Process:  Goal Directed and Descriptions of Associations: Intact  Orientation:  Full (Time, Place, and Person)  Thought Content: Logical   Suicidal Thoughts:  No  Homicidal Thoughts:  No  Memory:  Immediate;   Fair Recent;   Fair Remote;   Fair  Judgement:  Fair  Insight:  Fair  Psychomotor Activity:  Normal  Concentration:  Concentration: Fair and Attention Span: Fair  Recall:  [employer] of Knowledge: Fair  Language: Fair  Akathisia:  No  Handed:  Right  AIMS (if indicated): done,0  Assets:  Communication Skills Desire for Improvement Housing Social Support  ADL's:  Intact  Cognition: WNL  Sleep:  Excessive   Screenings: PHQ2-9    [facility] Office Visit from 09/15/2021 in [facility] from 08/27/2021 in [facility] Office Visit from 08/25/2021 in [facility] Visit from 08/18/2021 in [facility] Office Visit from 10/22/2020 in [facility]  PHQ-2 Total Score 6 4 0 6 2  PHQ-9 Total Score _0 [place] Office Visit from 09/15/2021 in [facility] Counselor from 08/27/2021 in [facility] Admission (Discharged) from 07/22/2021 in [facility] No Risk No Risk No Risk        Assessment and Plan: AMELIO BROSKY is a 75 year old Hispanic male, widowed, lives in [place], has a history of depression, multiple medical problems including peripheral artery disease, diabetes mellitus, hypertension, cardiomyopathy, CHF, hyperthyroidism, persistent diarrhea was evaluated in office today.  Patient is currently back on Effexor at 150 mg, continues to struggle with depression, fatigue, and also has persistent diarrhea which could be contributing to a lot of the symptoms.  Patient is interested in referral for [place], discussed the following plan.  Plan MDD-unstable Effexor 150 mg p.o. daily.  Will not make further medication readjustment today since patient recently started this medication as well as he currently has persistent diarrhea.  We will give this medication more time. Continue CBT with his therapist Ms.Brone Will refer patient for TMS-I have sent a referral to [facility].  Bereavement-unstable Patient referred for counseling.  Elevated blood pressure reading in session-patient to continue to follow up with primary care provider as well as cardiologist for the same.  Patient to follow-up in clinic after completing [place] as needed.  Also advised to sign an ROI so we can coordinate care with his other providers including gastroenterologist since he has persistent diarrhea.  Follow-up in clinic as needed.  This note was generated in part or whole with voice recognition software. Voice recognition is usually quite accurate but there are transcription errors that can and very often do occur. I apologize for any typographical errors that were not detected and corrected.        Ursula Alert, MD 09/16/2021, 11:44 AM

## 2021-09-15 NOTE — Patient Instructions (Signed)
Repetitive Transcranial Magnetic Stimulation Repetitive transcranial magnetic stimulation (rTMS) is an effective type of brain stimulation therapy that is used to treat major depression or other mental health conditions. You may have this procedure if your depression has not responded to medicine and other treatments. In rTMS, an insulated magnetic coil is held near your head. It delivers short electromagnetic pulses to specific areas of the brain that help to control mood. These electric currents stimulate nerve cells in the targeted area. You may have multiple treatments of rTMS. You will not feel the electric current or feel pain. Tell a health care provider about: Any allergies you have. All medicines you are taking, including vitamins, herbs, eye drops, creams, and over-the-counter medicines. Any bleeding problems you have. Any surgeries you have had. Any medical conditions you have. Whether you are pregnant or may be pregnant. Any metal you have in your body, such as a cochlear implant, spinal cord stimulator, pacemaker, stents, plates, or screws. What are the risks? Rare side effects include: Seizures. These are more common in people who have epilepsy, have a history of seizures, or are already taking certain medicines that can trigger seizures. Experiencing periods of elation or irritability and high energy (mania). This is more common in people who have bipolar disorder. Hearing loss. This can happen if you do not have good ear protection during treatment. What happens before the procedure? Ask your health care provider about: Changing or stopping your regular medicines. This is especially important if you are taking diabetes medicines or blood thinners. Taking over-the-counter medicines, vitamins, herbs, and supplements. What happens during the procedure?  You will be seated in a reclining chair. You will be given earplugs to wear. Your health care provider will use a machine to  place an electromagnetic coil against your head. Your health care provider will use a computer program to send electromagnetic pulses through the coil to specific areas of your brain. You may hear clicking sounds and feel tapping on your head. Your health care provider may adjust or move the coil during your treatment session. The procedure may vary among health care providers and hospitals. What can I expect after the procedure? After the procedure, it is common to have: A headache. Light-headedness. Mild discomfort, twitching, or tingling in your scalp. Discomfort in your jaw or cheek on the side of your head where you had the procedure. Follow these instructions at home: Take over-the-counter and prescription medicines only as told by your health care provider. Watch for any symptoms of depression. Keep all follow-up visits. This is important. Contact a health care provider if: Your headache and scalp discomfort continue after your treatment series is done. You have hearing loss or difficulty hearing. Your depression continues or gets worse. Get help right away if: You experience a period of elation or irritability and high energy (mania). You have serious thoughts about hurting yourself or others. If you ever feel like you may hurt yourself or others, or have thoughts about taking your own life, get help right away. Go to your nearest emergency department or: Call your local emergency services (911 in the [place] Call a suicide crisis helpline, such as the [facility] at 2362951137 or 988 in the [place]. This is open 24 hours a day in the [place] Text the Crisis Text Line at 206-229-4117 (in the [place].). Summary Repetitive transcranial magnetic stimulation (rTMS) is an effective type of brain stimulation therapy that is used to treat major depression or other mental health  conditions. You may have this procedure if your depression has not responded to medicine and other  treatments. In rTMS, an insulated magnetic coil is held near your head. It delivers short electromagnetic pulses to nerve cells in targeted areas. After the procedure, it is common to have a headache, light-headedness, and mild discomfort in your scalp or jaw. This information is not intended to replace advice given to you by your health care provider. Make sure you discuss any questions you have with your health care provider. Document Revised: 04/08/2021 Document Reviewed: 12/31/2020 Elsevier Patient Education  [place].

## 2021-10-13 ENCOUNTER — Ambulatory Visit: Payer: Medicare Other | Admitting: Physician Assistant

## 2021-10-20 ENCOUNTER — Ambulatory Visit: Payer: Medicare Other | Admitting: Family Medicine

## 2021-10-20 NOTE — Progress Notes (Signed)
Comprehensive Clinical Assessment (CCA) Note 08/27/2021 COLUM COLT [id number]  Chief Complaint: No chief complaint on file.  Visit Diagnosis: MDD; grief/loss   CCA Screening, Triage and Referral (STR)  Patient Reported Information How did you hear about [place]? psychiatrist Referral name: No data recorded Referral phone number: No data recorded  Whom do you see for routine medical problems? Primary Care  Practice/Facility Name: No data recorded Practice/Facility Phone Number: No data recorded Name of Contact: No data recorded Contact Number: No data recorded Contact Fax Number: No data recorded Prescriber Name: No data recorded Prescriber Address (if known): No data recorded  What Is the Reason for Your Visit/Call Today? No data recorded How Long Has This Been Causing You Problems? No data recorded What Do You Feel Would Help You the Most Today? No data recorded  Have You Recently Been in Any Inpatient Treatment (Hospital/Detox/Crisis Center/28-Day Program)? No data recorded Name/Location of Program/Hospital:No data recorded How Long Were You There? No data recorded When Were You Discharged? No data recorded  Have You Ever Received Services From [facility] Before? No data recorded Who Do You See at [facility]? No data recorded  Have You Recently Had Any Thoughts About Hurting Yourself? No data recorded Are You Planning to Commit Suicide/Harm Yourself At This time? No data recorded  Have you Recently Had Thoughts About [place]? No data recorded Explanation: No data recorded  Have You Used Any Alcohol or Drugs in the Past 24 Hours? No data recorded How Long Ago Did You Use Drugs or Alcohol? No data recorded What Did You Use and How Much? No data recorded  Do You Currently Have a Therapist/Psychiatrist? No data recorded Name of Therapist/Psychiatrist: No data recorded  Have You Been Recently Discharged From Any Office Practice or Programs? No data  recorded Explanation of Discharge From Practice/Program: No data recorded    CCA Screening Triage Referral Assessment Type of Contact: No data recorded Is this Initial or Reassessment? No data recorded Date Telepsych consult ordered in [facility]:  No data recorded Time Telepsych consult ordered in [facility]:  No data recorded  Patient Reported Information Reviewed? No data recorded Patient Left Without Being Seen? No data recorded Reason for Not Completing Assessment: No data recorded  Collateral Involvement: No data recorded  Does Patient Have a [place]? No data recorded Name and Contact of Legal Guardian: No data recorded If Minor and Not Living with Parent(s), Who has Custody? No data recorded Is CPS involved or ever been involved? No data recorded Is APS involved or ever been involved? No data recorded  Patient Determined To Be At Risk for Harm To Self or Others Based on Review of Patient Reported Information or Presenting Complaint? No data recorded Method: No data recorded Availability of Means: No data recorded Intent: No data recorded Notification Required: No data recorded Additional Information for Danger to Others Potential: No data recorded Additional Comments for Danger to Others Potential: No data recorded Are There Guns or Other Weapons in Your Home? No data recorded Types of Guns/Weapons: No data recorded Are These Weapons Safely Secured?                            No data recorded Who Could Verify You Are Able To Have These Secured: No data recorded Do You Have any Outstanding Charges, Pending Court Dates, Parole/Probation? No data recorded Contacted To Inform of Risk of Harm To Self or  Others: No data recorded  Location of Assessment: No data recorded  Does Patient Present under Involuntary Commitment? No data recorded IVC Papers Initial File Date: No data recorded  [place] of Residence: No data recorded  Patient Currently Receiving the Following  Services: No data recorded  Determination of Need: No data recorded  Options For Referral: No data recorded    CCA Biopsychosocial Intake/Chief Complaint:  Client is a 76 y/o hispanic male referred by psychiatrist for therapy. Client reported  increased anxiety,, depression, poor sleep/appetite, lack of motivation, lack of enjoying life, "people see me not showing feelings externally but i feel them internally." Clt reported After come back clt went to work in [place]; got devorced; exwife died. In December 16, 2012 moved back to [place] b/c daughter got pregnant/ nowhere to go to; continued working despite wanted to retired so Clt buy bigger house for self, clt, baby. Daughter left after 4 years; client moved back to 2 bedroom house alone; Then depression increased; stressors: oldest son patrick killed motorcycle accident in Mar 19, 2023; 12-17-02 start; son marine deployed to [place]; worry/depression  03/18/21 after sons death; trouble with sleep, apptetite, motivation, isolating (daughter and son in law working) states is 'sick and tired of that type of life' denies SI want to do what I used to do, like dong things'June-now, motivation, cry excessively; isolated 2 much; excessive sleeping ; up and down 12 hrs  Currently loses temper easily; feb-march/april use walker d/t poor balance; had to move in with daughter; had to sell house/car  Feb 2022-may 2022; fell onto floor at night time (doesn't remember fall and no strength to get up into bed)-called sister and wento to ED; told trouble with BP; few days later fell again (ED) problem with sugar; fell 3rd time in street (ED) problem with heart; fall 4th time (ED) nobody can give answers then Went to see neurology; dr Michela Pitcher one of meds was problem caused wobble; stopped meds could improved walking/driving  Recent Hx relationship; lady in army she live [place]; went to [place] before [place]; clt does not want relationship doesn't want relationship anymore; doesn't want  to be a burden but thinks  maybe breakup was mistake.  Current Symptoms/Problems: No data recorded  Patient Reported Schizophrenia/Schizoaffective Diagnosis in Past: No   Strengths: No data recorded Preferences: No data recorded Abilities: No data recorded  Type of Services Patient Feels are Needed: inquired about [place] and hypnosis for trouble with worry and temper   Initial Clinical Notes/Concerns: No data recorded  Mental Health Symptoms Depression:   Change in energy/activity; Fatigue; Increase/decrease in appetite; Irritability; Sleep (too much or little); Weight gain/loss   Duration of Depressive symptoms:  Greater than two weeks   Mania:   None   Anxiety:    Worrying   Psychosis:   None   Duration of Psychotic symptoms: No data recorded  Trauma:   None ("growing up was very different down there" ([place]))   Obsessions:   None   Compulsions:   None   Inattention:   None   Hyperactivity/Impulsivity:   None   Oppositional/Defiant Behaviors:   None   Emotional Irregularity:   Chronic feelings of emptiness   Other Mood/Personality Symptoms:  No data recorded   Mental Status Exam Appearance and self-care  Stature:   Average   Weight:   Average weight   Clothing:   Casual   Grooming:   Normal   Cosmetic use:   None   Posture/gait:   Normal  Motor activity:   Not Remarkable   Sensorium  Attention:   Normal   Concentration:   Anxiety interferes   Orientation:   X5   Recall/memory:   Normal   Affect and Mood  Affect:   Blunted   Mood:   Dysphoric   Relating  Eye contact:   Normal   Facial expression:   Responsive   Attitude toward examiner:   Cooperative   Thought and Language  Speech flow:  Normal   Thought content:   Appropriate to Mood and Circumstances   Preoccupation:   None   Hallucinations:   None   Organization:  No data recorded  [employer] of Knowledge:   Average   Intelligence:   Average    Abstraction:   Normal   Judgement:   Good; Fair   Reality Testing:   Realistic   Insight:   Fair   Decision Making:   Vacilates   Social Functioning  Social Maturity:   Isolates   Social Judgement:   "Games developer"   Stress  Stressors:   Relationship; Grief/losses; Transitions   Coping Ability:   Deficient supports   Skill Deficits:   Interpersonal   Supports:   Family     Religion: Religion/Spirituality Are You A Religious Person?: Yes  Leisure/Recreation: Leisure / Recreation Do You Have Hobbies?: Yes Leisure and Hobbies: Enjoy watching sports; 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  Exercise/Diet: Exercise/Diet Do You Exercise?: No Have You Gained or Lost A Significant Amount of Weight in the Past Six Months?: Yes-Lost Number of Pounds Lost?: 32 Do You Follow a Special Diet?: No Do You Have Any Trouble Sleeping?: Yes   CCA Employment/Education Employment/Work Situation: Employment / Work Situation Employment Situation: Retired Social research officer, government has Been Impacted by Current Illness: No What is the Longest Time Patient has Held a Job?: "many years" 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 Has Patient ever Been in the [employer]?: No  Education: Education Is Patient Currently Attending School?: No Did Teacher, adult education From [employer]?: Yes Did Physicist, medical?: Yes Did Heritage manager?: No Did You Have An Individualized Education Program (IIEP): No Did You Have Any Difficulty At [employer]?: No Patient's Education Has Been Impacted by Current Illness: No   CCA Family/Childhood History Family and Relationship History: Family history Marital status: Divorced (recent break up) Divorced, when?: divorce x 1; second wife died January 03, 2007 What types of issues is patient dealing with in the relationship?: 1st wife: cheating with best friend; 1978: batch in [place]; before graduated got married d/t pregnancy;  put gun to head demand marriage married age 53; father kicked out of house went to live with aunt; daughter December 1966 born Additional relationship information: hurt when kids did not pay much attending when his second wife died in January 03, 2007; 1977-01-02 work accident; come home nobody there: unknown if wife cheated With best friend; got divorced and moved to [place]; 1 yr later kids want to live here (exwife came, still here-married 2 times)  1st marriage 2 kids daughter and dead son [id number]); 2nd marriage one 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, Teacher, 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; one spending impulsively     10 years ago: see exwife but not too often (no bother) Are you sexually active?: No Does patient have children?: Yes How many children?: 5 How is patient's relationship with their children?: positive with most; 1 son died 01-02-21, 1 son in Nature conservation officer,, 1 firefighter, one daughter/grandchild  Childhood  History:  Childhood History By whom was/is the patient raised?: Both parents Additional childhood history information: lots of poverty growing up Description of patient's relationship with caregiver when they were a child: good Patient's description of current relationship with people who raised him/her: deceased Does patient have siblings?: Yes Number of Siblings: 3 Description of patient's current relationship with siblings: good with 1 brother living in [place], good with one sister living in [place], rocky with other brother "never calls when he's financially good there is little contact" Did patient suffer any verbal/emotional/physical/sexual abuse as a child?: No Did patient suffer from severe childhood neglect?: No Has patient ever been sexually abused/assaulted/raped as an adolescent or adult?: No Was the patient ever a victim of a crime or a disaster?: No Witnessed domestic violence?: No Has patient been affected by domestic violence as an adult?: No  Child/Adolescent Assessment:     CCA Substance Use Alcohol/Drug Use: Alcohol / Drug  Use Pain Medications: see EHR Prescriptions: see EHR Over the Counter: see EHR History of alcohol / drug use?: No history of alcohol / drug abuse                         ASAM's:  Six Dimensions of Multidimensional Assessment  Dimension 1:  Acute Intoxication and/or Withdrawal Potential:      Dimension 2:  Biomedical Conditions and Complications:      Dimension 3:  Emotional, Behavioral, or Cognitive Conditions and Complications:     Dimension 4:  Readiness to Change:     Dimension 5:  Relapse, Continued use, or Continued Problem Potential:     Dimension 6:  Recovery/Living Environment:     ASAM Severity Score:    ASAM Recommended Level of Treatment:     Substance use Disorder (SUD)    Recommendations for Services/Supports/Treatments: Recommendations for Services/Supports/Treatments Recommendations For Services/Supports/Treatments: Individual Therapy, Medication Management  DSM5 Diagnoses: Patient Active Problem List   Diagnosis Date Noted   Elevated blood pressure reading 09/15/2021   MDD (major depressive disorder), recurrent episode, moderate ([place]) 08/18/2021   Bereavement 08/18/2021   Frequent falls 13/24/4010   Chronic systolic CHF (congestive heart failure) ([place]) 11/05/2020   Syncope 11/04/2020   Chronic kidney disease, stage 3b ([place]) 11/04/2020   Benign prostatic hyperplasia with lower urinary tract symptoms 08/27/2020   Screening for colon cancer    Polyp of colon    Early satiety    Non-intractable vomiting    Stasis dermatitis of both legs 06/09/2018   Bilateral carpal tunnel syndrome 11/25/2017   Nocturia 08/17/2017   CKD stage 3 due to type 2 diabetes mellitus ([place]) 05/05/2017   Heart failure with reduced ejection fraction ([place]) 11/30/2016   AF (paroxysmal atrial fibrillation) ([place]) 11/30/2016   Subclinical hyperthyroidism 11/30/2016   Insomnia 05/20/2015   Depression 05/20/2015   Mixed hyperlipidemia 05/20/2015   Anxiety 05/20/2015    Erectile dysfunction 05/20/2015   Essential hypertension 05/20/2015   Type 2 diabetes mellitus without complication, with long-term current use of insulin ([place]) 05/20/2015   GERD without esophagitis 05/20/2015   Osteoarthritis, knee 05/20/2015    Patient Centered Plan: Patient is on the following Treatment Plan(s):  Anxiety and Depression   Referrals to Alternative Service(s): Referred to Alternative Service(s):   Place:   Date:   Time:    Referred to Alternative Service(s):   Place:   Date:   Time:    Referred to Alternative Service(s):   Place:   Date:  Time:    Referred to Alternative Service(s):   Place:   Date:   Time:     Olegario Messier, LCSW

## 2021-10-29 ENCOUNTER — Other Ambulatory Visit: Payer: Self-pay

## 2021-10-29 ENCOUNTER — Encounter: Payer: Self-pay | Admitting: Family Medicine

## 2021-10-29 ENCOUNTER — Ambulatory Visit (INDEPENDENT_AMBULATORY_CARE_PROVIDER_SITE_OTHER): Payer: Medicare Other | Admitting: Family Medicine

## 2021-10-29 ENCOUNTER — Ambulatory Visit: Payer: Self-pay | Admitting: *Deleted

## 2021-10-29 VITALS — BP 151/83 | HR 83 | Temp 98.7°F | Resp 18 | Wt 220.0 lb

## 2021-10-29 DIAGNOSIS — I509 Heart failure, unspecified: Secondary | ICD-10-CM | POA: Diagnosis not present

## 2021-10-29 DIAGNOSIS — E0590 Thyrotoxicosis, unspecified without thyrotoxic crisis or storm: Secondary | ICD-10-CM | POA: Diagnosis not present

## 2021-10-29 DIAGNOSIS — R0602 Shortness of breath: Secondary | ICD-10-CM

## 2021-10-29 DIAGNOSIS — G4733 Obstructive sleep apnea (adult) (pediatric): Secondary | ICD-10-CM | POA: Diagnosis not present

## 2021-10-29 DIAGNOSIS — Z794 Long term (current) use of insulin: Secondary | ICD-10-CM | POA: Diagnosis not present

## 2021-10-29 DIAGNOSIS — N1831 Chronic kidney disease, stage 3a: Secondary | ICD-10-CM | POA: Diagnosis not present

## 2021-10-29 DIAGNOSIS — I10 Essential (primary) hypertension: Secondary | ICD-10-CM

## 2021-10-29 DIAGNOSIS — E1122 Type 2 diabetes mellitus with diabetic chronic kidney disease: Secondary | ICD-10-CM | POA: Diagnosis not present

## 2021-10-29 MED ORDER — DUTASTERIDE 0.5 MG PO CAPS: 0.5000 mg | ORAL_CAPSULE | Freq: Two times a day (BID) | ORAL | 1 refills | Status: DC

## 2021-10-29 MED ORDER — FUROSEMIDE 40 MG PO TABS: 40.0000 mg | ORAL_TABLET | Freq: Two times a day (BID) | ORAL | 1 refills | Status: DC

## 2021-10-29 NOTE — Telephone Encounter (Signed)
Appt made

## 2021-10-29 NOTE — Telephone Encounter (Signed)
Reason for Disposition  [1] MILD difficulty breathing (e.g., minimal/no SOB at rest, SOB with walking, pulse <100) AND [2] NEW-onset or WORSE than normal  Answer Assessment - Initial Assessment Questions 1. RESPIRATORY STATUS: "Describe your breathing?" (e.g., wheezing, shortness of breath, unable to speak, severe coughing)      Trouble breathing at night- wakes up at night- 7-8 times/night 2. ONSET: "When did this breathing problem begin?"      3 times- patient has had trouble before- he has increased diuretic  3. PATTERN "Does the difficult breathing come and go, or has it been constant since it started?"      Only at night 4. SEVERITY: "How bad is your breathing?" (e.g., mild, moderate, severe)    - MILD: No SOB at rest, mild SOB with walking, speaks normally in sentences, can lie down, no retractions, pulse < 100.    - MODERATE: SOB at rest, SOB with minimal exertion and prefers to sit, cannot lie down flat, speaks in phrases, mild retractions, audible wheezing, pulse 100-120.    - SEVERE: Very SOB at rest, speaks in single words, struggling to breathe, sitting hunched forward, retractions, pulse > 120      Wakes up at night 5. RECURRENT SYMPTOM: "Have you had difficulty breathing before?" If Yes, ask: "When was the last time?" and "What happened that time?"      *No Answer* 6. CARDIAC HISTORY: "Do you have any history of heart disease?" (e.g., heart attack, angina, bypass surgery, angioplasty)      *No Answer* 7. LUNG HISTORY: "Do you have any history of lung disease?"  (e.g., pulmonary embolus, asthma, emphysema)     *No Answer* 8. CAUSE: "What do you think is causing the breathing problem?"      Decreased diuretic dosing  9. OTHER SYMPTOMS: "Do you have any other symptoms? (e.g., dizziness, runny nose, cough, chest pain, fever)     No other symptoms 10. O2 SATURATION MONITOR:  "Do you use an oxygen saturation monitor (pulse oximeter) at home?" If Yes, "What is your reading (oxygen  level) today?" "What is your usual oxygen saturation reading?" (e.g., 95%)       *No Answer* 11. PREGNANCY: "Is there any chance you are pregnant?" "When was your last menstrual period?"       *No Answer* 12. TRAVEL: "Have you traveled out of the country in the last month?" (e.g., travel history, exposures)       *No Answer*  Protocols used: Breathing Difficulty-A-AH

## 2021-10-29 NOTE — Telephone Encounter (Signed)
°  Chief Complaint: waking up at night- SOB, not during the day Symptoms: waking at night 7-8 times- feels SOB Frequency: has occurred 3 times- patient believes due to decrease in furosemide dosing- from 40mg  to 20mg  Pertinent Negatives: Patient denies dizziness, runny nose, cough, chest pain, fever Disposition: [] ED /[] Urgent Care (no appt availability in office) / [] Appointment(In office/virtual)/ []  [facility]/ [] Home Care/ [x] Refused Recommended Disposition /[]  Mobile Bus/ []  Follow-up with PCP Additional Notes: Patient request appointment- advised first available is next week- patient advised UC- he wants office appointment . Patient states he needs to be able to sleep at night

## 2021-10-29 NOTE — Telephone Encounter (Signed)
Please advise 

## 2021-10-29 NOTE — Progress Notes (Signed)
Established patient visit  I,[name],acting as a scribe for [name], MD.,have documented all relevant documentation on the behalf of [name], MD,as directed by  [name], MD while in the presence of [name], MD.   Patient: Tom Moore   DOB: 06-07-1946   75 y.o. Male  MRN: [id number] Visit Date: 10/29/2021  Today's healthcare provider: [name], MD   Chief Complaint  Patient presents with   Shortness of Breath   Subjective    Shortness of Breath This is a new problem. The problem occurs intermittently. Associated symptoms include rhinorrhea. Pertinent negatives include no abdominal pain, chest pain, claudication, coryza, ear pain, fever, headaches, hemoptysis, leg pain, leg swelling, neck pain, orthopnea, PND, rash, sore throat, sputum production, swollen glands, syncope, vomiting or wheezing. Nothing aggravates the symptoms.    Patient has had shortness of breath when he is asleep for one week. Patient states he wakes up 6-7 times a night. Patient states he has no cough, congestion, fever or sore throat. Patient is not a good historian and it is unclear what medications he is taking now.  He states he is off of his beta-blocker and off of Entresto.  That is found no RN notes from any of his specialist.  Patient states he gets short of night during the night for the past week or so.  He has no chest pain or exertional symptoms.  He states he snores but claims he has had 3 negative sleep studies in the past but none in the past few years.  He is now followed by psychiatry for major depression Medications: Outpatient Medications Prior to Visit  Medication Sig   atorvastatin (LIPITOR) 40 MG tablet Take 40 mg by mouth daily.   Blood Glucose Calibration (OT ULTRA/FASTTK CNTRL SOLN) SOLN    diltiazem (CARDIZEM CD) 120 MG 24 hr capsule Take 1 capsule by mouth once daily   Dulaglutide (TRULICITY) 1.5 FA/2.1HY SOPN Inject 1.5 mg  into the skin once a week. Mondays   metFORMIN (GLUCOPHAGE) 1000 MG tablet TAKE 1 TABLET BY MOUTH TWICE DAILY WITH A MEAL (Patient taking differently: Take 1,000 mg by mouth in the morning and at bedtime.)   methimazole (TAPAZOLE) 5 MG tablet Take 2 tablets by mouth daily.   metoprolol succinate (TOPROL-XL) 100 MG 24 hr tablet metoprolol succinate ER 100 mg tablet,extended release 24 hr   Multiple Vitamin (MULTIVITAMIN WITH MINERALS) TABS tablet Take 1 tablet by mouth daily.   omeprazole (PRILOSEC) 40 MG capsule Take 1 capsule by mouth once daily   ONETOUCH ULTRA test strip USE 1 STRIP TO CHECK GLUCOSE TWICE DAILY   sacubitril-valsartan (ENTRESTO) 24-26 MG Take 1 tablet by mouth daily.   sildenafil (VIAGRA) 100 MG tablet Take 1 tab 1 hour prior to intercourse   venlafaxine XR (EFFEXOR-XR) 150 MG 24 hr capsule Take 1 capsule (150 mg total) by mouth daily.   insulin NPH Human (NOVOLIN N) 100 UNIT/ML injection Inject into the skin.   insulin NPH-regular Human (70-30) 100 UNIT/ML injection Inject 20 Units into the skin 2 (two) times daily with a meal.   meclizine (ANTIVERT) 25 MG tablet TAKE 1 TABLET BY MOUTH THREE TIMES DAILY AS NEEDED FOR DIZZINESS (Patient not taking: Reported on 09/15/2021)   PACERONE 200 MG tablet Take 200 mg by mouth daily. (Patient not taking: Reported on 10/29/2021)   tamsulosin (FLOMAX) 0.4 MG CAPS capsule TAKE 2 CAPSULES BY MOUTH ONCE DAILY WITH  LUNCH (Patient not taking: Reported on 09/15/2021)   traMADol (ULTRAM) 50 MG tablet every 6 (six) hours. (Patient not taking: Reported on 09/15/2021)   [DISCONTINUED] atorvastatin (LIPITOR) 40 MG tablet Take by mouth. (Patient not taking: Reported on 09/15/2021)   [DISCONTINUED] BINAXNOW COVID-19 AG HOME TEST KIT Use as Directed on the Package   [DISCONTINUED] cetirizine (ZYRTEC) 10 MG tablet Take 1 tablet by mouth once daily (Patient not taking: Reported on 09/15/2021)   [DISCONTINUED] cetirizine (ZYRTEC) 10 MG tablet Take by mouth.  (Patient not taking: Reported on 09/15/2021)   [DISCONTINUED] ciprofloxacin (CIPRO) 500 MG tablet TAKE 1 TABLET BY MOUTH TWICE DAILY FOR 3 DAYS (Patient not taking: Reported on 09/15/2021)   [DISCONTINUED] diltiazem (TIAZAC) 120 MG 24 hr capsule Take by mouth. (Patient not taking: Reported on 09/15/2021)   [DISCONTINUED] diphenoxylate-atropine (LOMOTIL) 2.5-0.025 MG tablet Take 1 tablet by mouth 4 (four) times daily as needed. (Patient not taking: Reported on 09/15/2021)   [DISCONTINUED] dutasteride (AVODART) 0.5 MG capsule Take 1 capsule by mouth once daily (Patient not taking: Reported on 09/15/2021)   [DISCONTINUED] furosemide (LASIX) 40 MG tablet Take 1 tablet (40 mg total) by mouth daily.   [DISCONTINUED] metFORMIN (GLUCOPHAGE) 1000 MG tablet Take by mouth. (Patient not taking: Reported on 09/15/2021)   [DISCONTINUED] metoprolol tartrate (LOPRESSOR) 25 MG tablet Take 25 mg by mouth 2 (two) times daily. (Patient not taking: Reported on 09/15/2021)   [DISCONTINUED] omeprazole (PRILOSEC) 40 MG capsule Take by mouth. (Patient not taking: Reported on 09/15/2021)   [DISCONTINUED] sucralfate (CARAFATE) 1 g tablet TAKE 1 TABLET BY MOUTH THREE TIMES DAILY WITH MEALS (Patient not taking: Reported on 09/15/2021)   No facility-administered medications prior to visit.    Review of Systems  Constitutional:  Negative for appetite change, chills and fever.  HENT:  Positive for rhinorrhea. Negative for ear pain and sore throat.   Respiratory:  Positive for shortness of breath. Negative for hemoptysis, sputum production, chest tightness and wheezing.   Cardiovascular:  Negative for chest pain, palpitations, orthopnea, claudication, leg swelling, syncope and PND.  Gastrointestinal:  Negative for abdominal pain, nausea and vomiting.  Musculoskeletal:  Negative for neck pain.  Skin:  Negative for rash.  Neurological:  Negative for headaches.   Last hemoglobin A1c Lab Results  Component Value Date   HGBA1C  7.0 (H) 11/04/2020       Objective    BP (!) 151/83 (BP Location: Left Arm, Patient Position: Sitting, Cuff Size: Large)    Pulse 83    Temp 98.7 F (37.1 C) (Temporal)    Resp 18    Wt 220 lb (99.8 kg)    SpO2 99%    BMI 32.49 kg/m  BP Readings from Last 3 Encounters:  10/29/21 (!) 151/83  08/27/21 133/75  08/25/21 118/77   Wt Readings from Last 3 Encounters:  10/29/21 220 lb (99.8 kg)  08/27/21 212 lb (96.2 kg)  08/25/21 212 lb (96.2 kg)      Physical Exam Vitals and nursing note reviewed.  Constitutional:      Appearance: Normal appearance. He is normal weight.  HENT:     Head: Normocephalic and atraumatic.     Right Ear: External ear normal.     Left Ear: External ear normal.  Eyes:     General: No scleral icterus.    Conjunctiva/sclera: Conjunctivae normal.  Cardiovascular:     Rate and Rhythm: Normal rate and regular rhythm.     Pulses: Normal pulses.  Heart sounds: Normal heart sounds.  Pulmonary:     Effort: Pulmonary effort is normal.     Breath sounds: Normal breath sounds.  Abdominal:     General: There is no distension.     Palpations: Abdomen is soft.     Tenderness: There is no abdominal tenderness.  Genitourinary:    Penis: Normal.   Musculoskeletal:     Cervical back: Normal range of motion and neck supple.     Right lower leg: No edema.     Left lower leg: No edema.  Skin:    General: Skin is warm and dry.  Neurological:     General: No focal deficit present.     Mental Status: He is alert and oriented to person, place, and time.     Gait: Gait normal.  Psychiatric:        Mood and Affect: Mood normal.        Behavior: Behavior normal.        Thought Content: Thought content normal.        Judgment: Judgment normal.      No results found for any visits on 10/29/21.  Assessment & Plan     1. Shortness of breath It is possible this is heart failure but I am not sure he is taking his Entresto.  It is also possible this is sleep  apnea.  Patient needs to bring in his medications with each visit.  He had a home sleep study - EKG 12-Lead - Ambulatory referral to Cardiology - CBC w/Diff/Platelet - Renal function panel - Pro b natriuretic peptide (BNP)[facility] [facility]) - DG Chest 2 View  2. Congestive heart failure, unspecified HF chronicity, unspecified heart failure type [facility]) Follow-up with cardiology.  Obtain lab work and chest x-ray today.  His O2 sat is 99% in the office today he desats only to 93 to 94% with walking. We will double Lasix dose pending cardiology referral and pending Entresto use?  It is very possible it was discontinued as his last EF was 65%. - Ambulatory referral to Cardiology - CBC w/Diff/Platelet - Renal function panel - Pro b natriuretic peptide (BNP)[facility] [facility])  3. OSA (obstructive sleep apnea)  - Ambulatory referral to Cardiology - CBC w/Diff/Platelet - Renal function panel - Pro b natriuretic peptide (BNP)[facility] [facility]) - Ambulatory referral to Pulmonology  4. Primary hypertension  - Ambulatory referral to Cardiology - CBC w/Diff/Platelet - Renal function panel - Pro b natriuretic peptide (BNP)[facility] [facility]) 5.  Major depressive disorder and mild cognitive impairment. I think both of these could be contributory to symptoms especially if he is not taking his medications.   Return in about 3 months (around 01/26/2022).      I, [name], MD, have reviewed all documentation for this visit. The documentation on 10/30/21 for the exam, diagnosis, procedures, and orders are all accurate and complete.    Dontrae Morini Cranford Mon, MD  [facility] [phone] (phone) [phone] (fax)  [place]

## 2021-10-30 ENCOUNTER — Ambulatory Visit
Admission: RE | Admit: 2021-10-30 | Discharge: 2021-10-30 | Disposition: A | Discharge status: Home / Self Care | Payer: Medicare Other | Source: Ambulatory Visit | Attending: Family Medicine | Admitting: Family Medicine

## 2021-10-30 ENCOUNTER — Ambulatory Visit
Admission: RE | Admit: 2021-10-30 | Discharge: 2021-10-30 | Disposition: A | Discharge status: Home / Self Care | Payer: Medicare Other | Attending: Family Medicine | Admitting: Family Medicine

## 2021-10-30 DIAGNOSIS — R0602 Shortness of breath: Secondary | ICD-10-CM | POA: Diagnosis not present

## 2021-10-30 LAB — CBC WITH DIFFERENTIAL/PLATELET
Basophils Absolute: 0 10*3/uL (ref 0.0–0.2)
Basos: 0 %
EOS (ABSOLUTE): 0.3 10*3/uL (ref 0.0–0.4)
Eos: 4 %
Hematocrit: 37.2 % — ABNORMAL LOW (ref 37.5–51.0)
Hemoglobin: 12.2 g/dL — ABNORMAL LOW (ref 13.0–17.7)
Immature Grans (Abs): 0 10*3/uL (ref 0.0–0.1)
Immature Granulocytes: 0 %
Lymphocytes Absolute: 2.1 10*3/uL (ref 0.7–3.1)
Lymphs: 27 %
MCH: 29.3 pg (ref 26.6–33.0)
MCHC: 32.8 g/dL (ref 31.5–35.7)
MCV: 89 fL (ref 79–97)
Monocytes Absolute: 0.6 10*3/uL (ref 0.1–0.9)
Monocytes: 8 %
Neutrophils Absolute: 4.6 10*3/uL (ref 1.4–7.0)
Neutrophils: 61 %
Platelets: 137 10*3/uL — ABNORMAL LOW (ref 150–450)
RBC: 4.16 x10E6/uL (ref 4.14–5.80)
RDW: 14.4 % (ref 11.6–15.4)
WBC: 7.7 10*3/uL (ref 3.4–10.8)

## 2021-10-30 LAB — RENAL FUNCTION PANEL
Albumin: 4.2 g/dL (ref 3.7–4.7)
BUN/Creatinine Ratio: 12 (ref 10–24)
BUN: 16 mg/dL (ref 8–27)
CO2: 26 mmol/L (ref 20–29)
Calcium: 9.2 mg/dL (ref 8.6–10.2)
Chloride: 108 mmol/L — ABNORMAL HIGH (ref 96–106)
Creatinine, Ser: 1.34 mg/dL — ABNORMAL HIGH (ref 0.76–1.27)
Glucose: 133 mg/dL — ABNORMAL HIGH (ref 70–99)
Phosphorus: 3.7 mg/dL (ref 2.8–4.1)
Potassium: 4.8 mmol/L (ref 3.5–5.2)
Sodium: 147 mmol/L — ABNORMAL HIGH (ref 134–144)
eGFR: 56 mL/min/{1.73_m2} — ABNORMAL LOW (ref >59)

## 2021-10-30 LAB — PRO B NATRIURETIC PEPTIDE: NT-Pro BNP: 3468 pg/mL — ABNORMAL HIGH (ref 0–376)

## 2021-10-30 IMAGING — CR DG CHEST 2V
1 series · 2 of 2 positions shown · non-contrast
Comparison: [DATE]

CLINICAL DATA: Shortness of breath

EXAM:
CHEST - 2 VIEW

[Series 1: dg chest 2 view · 0.14mm/px · 2 of 2 slices shown]
[im 1/2]
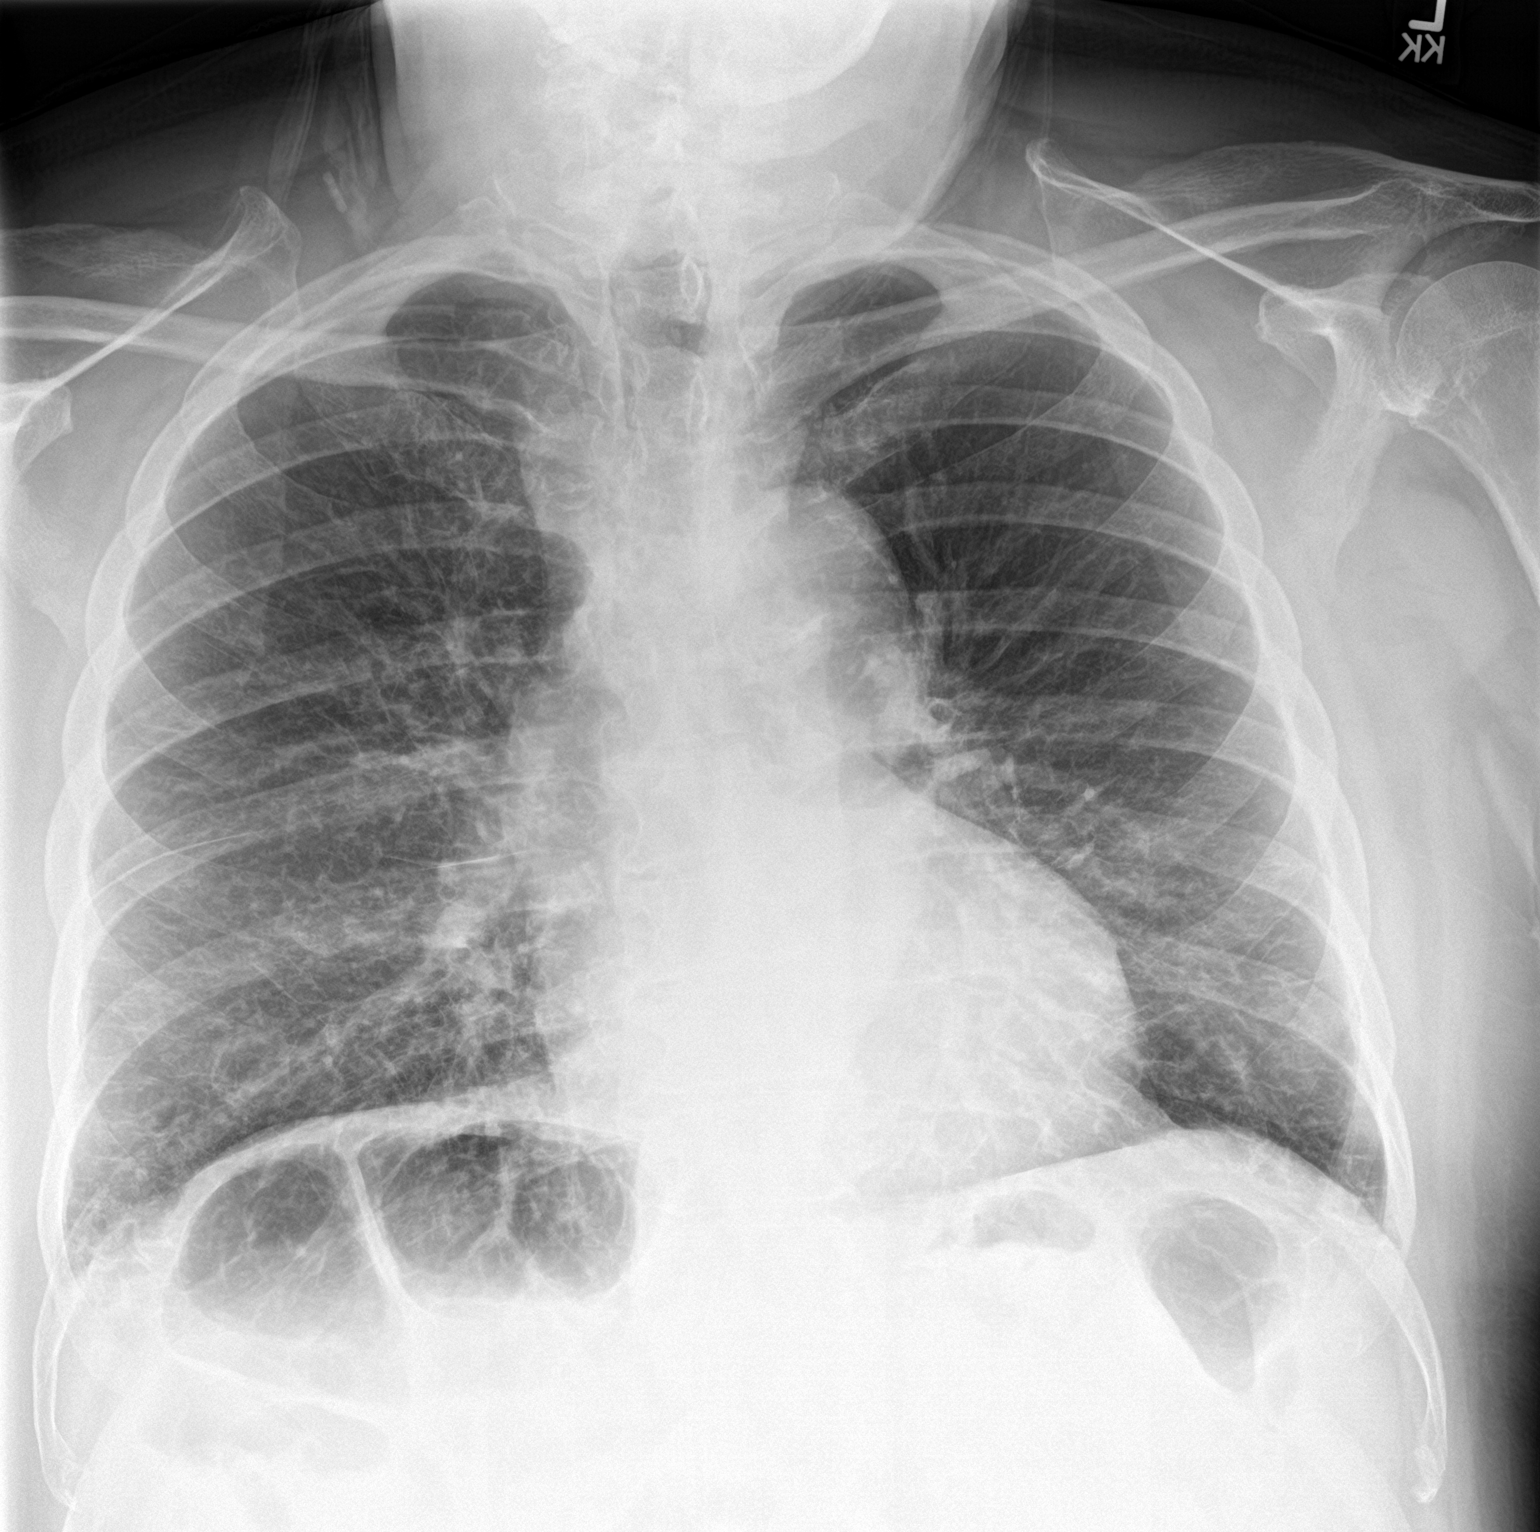
[im 2/2]
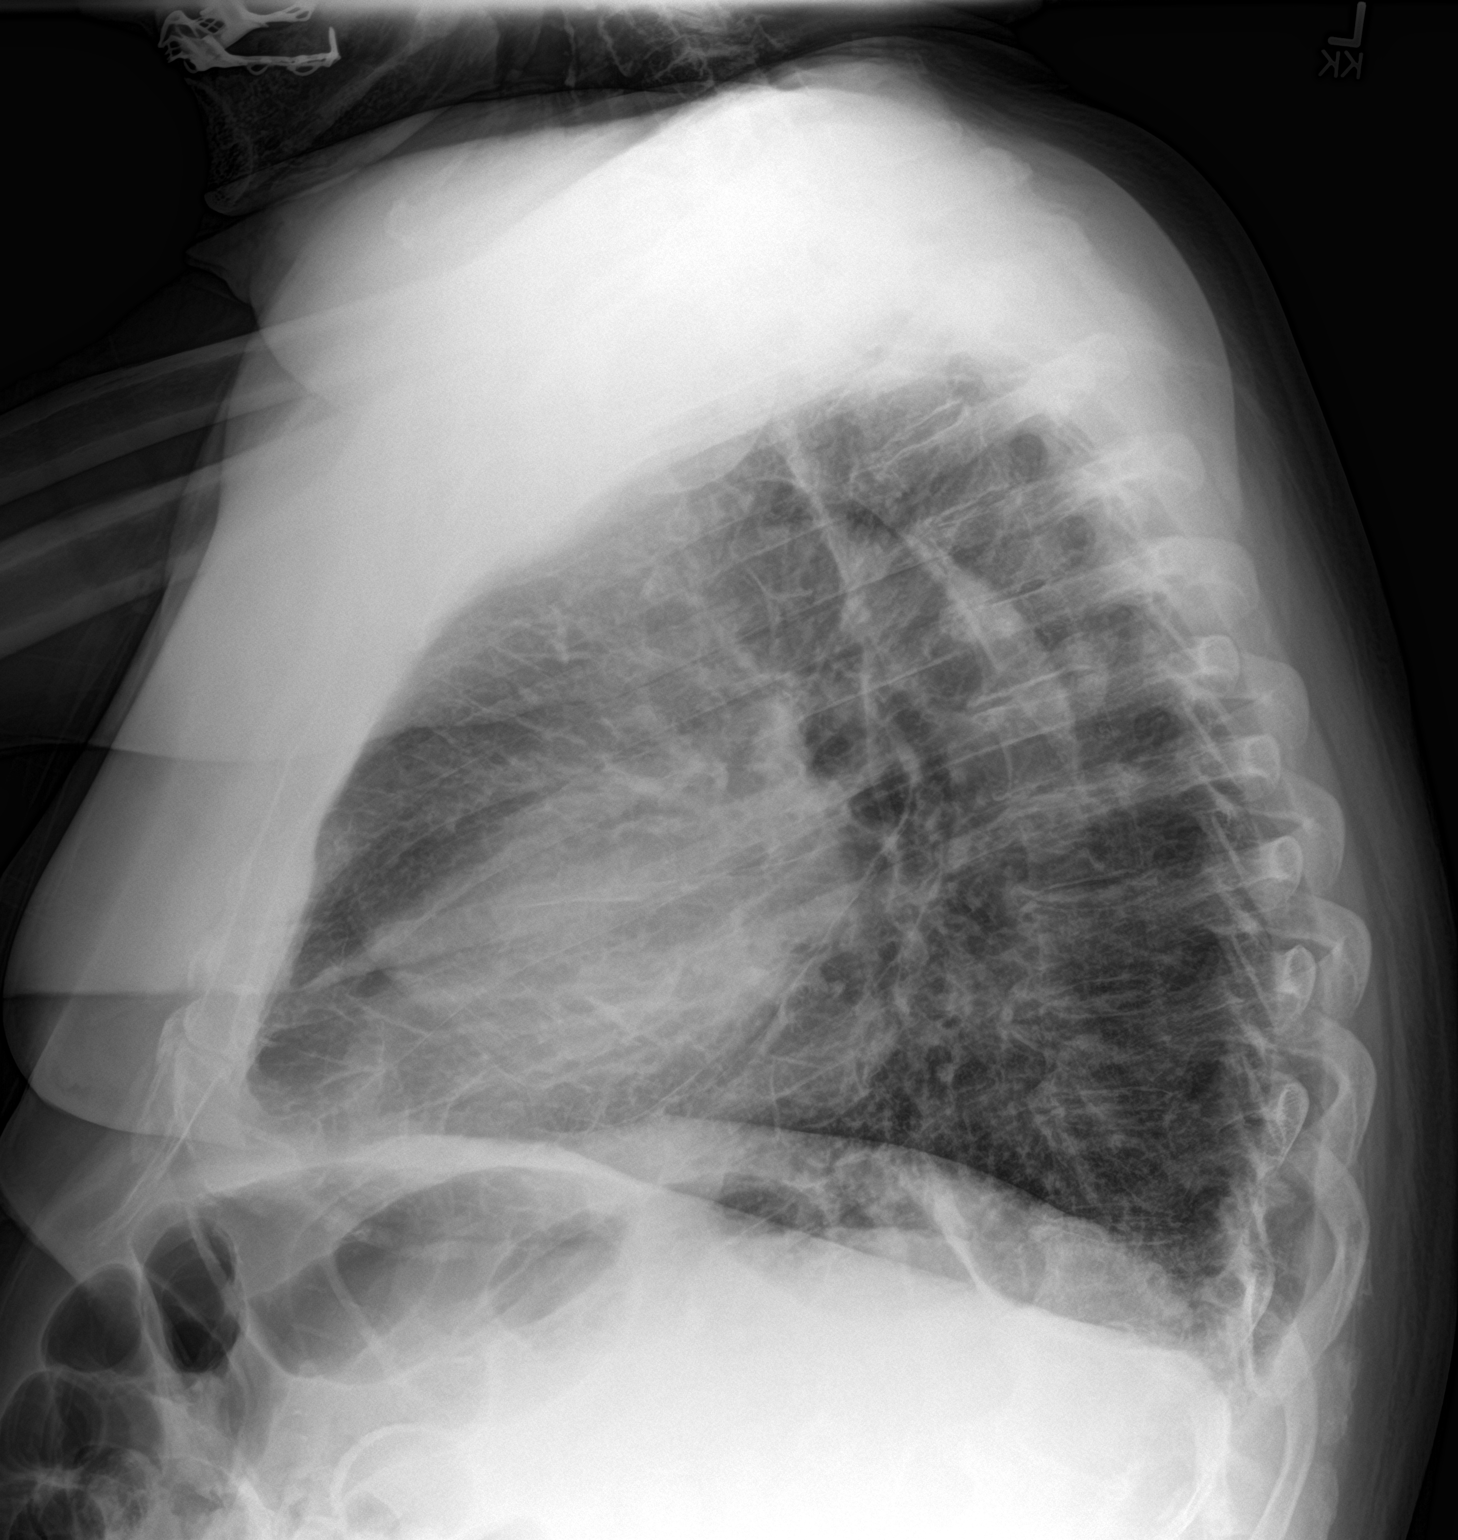

[2 of 2 positions shown; findings below may reference images not displayed]

FINDINGS: Cardiac shadow is stable. Mild aortic calcifications are noted. Mild
interstitial thickening is noted which may represent some early
edema. No focal infiltrate or effusion is seen. No bony abnormality
is noted
IMPRESSION: Changes of mild interstitial edema.

## 2021-11-03 ENCOUNTER — Ambulatory Visit: Payer: Medicare Other | Admitting: Physician Assistant

## 2021-11-04 DIAGNOSIS — N1831 Chronic kidney disease, stage 3a: Secondary | ICD-10-CM | POA: Diagnosis not present

## 2021-11-04 DIAGNOSIS — E0590 Thyrotoxicosis, unspecified without thyrotoxic crisis or storm: Secondary | ICD-10-CM | POA: Diagnosis not present

## 2021-11-04 DIAGNOSIS — E1122 Type 2 diabetes mellitus with diabetic chronic kidney disease: Secondary | ICD-10-CM | POA: Diagnosis not present

## 2021-11-04 DIAGNOSIS — Z794 Long term (current) use of insulin: Secondary | ICD-10-CM | POA: Diagnosis not present

## 2021-11-05 ENCOUNTER — Other Ambulatory Visit: Payer: Self-pay | Admitting: *Deleted

## 2021-11-05 ENCOUNTER — Ambulatory Visit (HOSPITAL_COMMUNITY): Payer: Medicare Other | Admitting: Licensed Clinical Social Worker

## 2021-11-09 ENCOUNTER — Telehealth: Payer: Self-pay

## 2021-11-09 DIAGNOSIS — F331 Major depressive disorder, recurrent, moderate: Secondary | ICD-10-CM

## 2021-11-09 MED ORDER — SILDENAFIL CITRATE 100 MG PO TABS: ORAL_TABLET | ORAL | 0 refills | Status: DC

## 2021-11-09 MED ORDER — TAMSULOSIN HCL 0.4 MG PO CAPS: ORAL_CAPSULE | ORAL | 0 refills | Status: DC

## 2021-11-09 NOTE — Telephone Encounter (Addendum)
Patient is leaving tomorrow, Feb. 14th, for [facility] for 3 months. He needs a note with all his medications listed with Dr. Marlan Palau name on it, to get through Customs. He also needs an extra written pres for all his meds to take with him in case he needs to get his meds in [place].       Call [phone] when completed.

## 2021-11-09 NOTE — Telephone Encounter (Incomplete Revision)
Patient is leaving tomorrow, Feb. 14th, for [facility] for 3 months. He needs a note with all his medications listed with Dr. Marlan Palau name on it, to get through Customs. He also needs an extra written pres for all his meds to take with him in case he needs to get his meds in [place].       Call [phone] when completed.

## 2021-11-09 NOTE — Telephone Encounter (Signed)
Patient called in checking on status of getting his lif of medications,He also needs an extra written pres for all his meds to take with him in case he needs to get his meds in [place].

## 2021-11-09 NOTE — Telephone Encounter (Signed)
Please advise 

## 2021-11-10 ENCOUNTER — Encounter: Payer: Self-pay | Admitting: *Deleted

## 2021-11-10 MED ORDER — FUROSEMIDE 40 MG PO TABS: 40.0000 mg | ORAL_TABLET | Freq: Two times a day (BID) | ORAL | 0 refills | Status: DC

## 2021-11-10 MED ORDER — OMEPRAZOLE 40 MG PO CPDR: 40.0000 mg | DELAYED_RELEASE_CAPSULE | Freq: Every day | ORAL | 0 refills | Status: DC

## 2021-11-10 MED ORDER — DILTIAZEM HCL ER COATED BEADS 120 MG PO CP24: 120.0000 mg | ORAL_CAPSULE | Freq: Every day | ORAL | 0 refills | Status: AC

## 2021-11-10 MED ORDER — VENLAFAXINE HCL ER 150 MG PO CP24: 150.0000 mg | ORAL_CAPSULE | Freq: Every day | ORAL | 0 refills | Status: DC

## 2021-11-10 NOTE — Telephone Encounter (Signed)
Letter and Rx's are ready to pick up.

## 2021-12-07 DIAGNOSIS — T7840XA Allergy, unspecified, initial encounter: Secondary | ICD-10-CM | POA: Diagnosis not present

## 2021-12-07 DIAGNOSIS — I2721 Secondary pulmonary arterial hypertension: Secondary | ICD-10-CM | POA: Diagnosis not present

## 2021-12-07 DIAGNOSIS — E119 Type 2 diabetes mellitus without complications: Secondary | ICD-10-CM | POA: Diagnosis not present

## 2021-12-07 DIAGNOSIS — R6889 Other general symptoms and signs: Secondary | ICD-10-CM | POA: Diagnosis not present

## 2021-12-14 DIAGNOSIS — R6889 Other general symptoms and signs: Secondary | ICD-10-CM | POA: Diagnosis not present

## 2021-12-14 DIAGNOSIS — T7840XA Allergy, unspecified, initial encounter: Secondary | ICD-10-CM | POA: Diagnosis not present

## 2022-01-05 ENCOUNTER — Telehealth: Payer: Self-pay | Admitting: Family Medicine

## 2022-01-05 NOTE — Telephone Encounter (Signed)
Copied from [place] [id number]. Topic: Medicare AWV ?>> Jan 05, 2022  9:00 AM Cher Nakai R wrote: ?Reason for CRM:  ?Left message for patient to call back and schedule Medicare Annual Wellness Visit (AWV) in office.  ? ?If unable to come into the office for AWV,  please offer to do virtually or by telephone. ? ?Last AWV: 10/22/2020 ? ?Please schedule at anytime with [place]. ? ?30 minute appointment for Virtual or phone ?45 minute appointment for in office or Initial virtual/phone ? ?Any questions, please contact me at [phone] ?

## 2022-01-26 ENCOUNTER — Ambulatory Visit: Payer: Medicare Other | Admitting: Family Medicine

## 2022-01-26 NOTE — Progress Notes (Deleted)
Argentina Ponder Wandalene Abrams,acting as a scribe for [name], MD.,have documented all relevant documentation on the behalf of [name], MD,as directed by  [name], MD while in the presence of [name], MD.     Established patient visit   Patient: Tom Moore   DOB: [birth year]   76 y.o. Male  MRN: [id number] Visit Date: 01/26/2022  Today's healthcare provider: [name], MD   No chief complaint on file.  Subjective    HPI   Hypertension, follow-up  BP Readings from Last 3 Encounters:  10/29/21 (!) 151/83  08/27/21 133/75  08/25/21 118/77   Wt Readings from Last 3 Encounters:  10/29/21 220 lb (99.8 kg)  08/27/21 212 lb (96.2 kg)  08/25/21 212 lb (96.2 kg)     He was last seen for hypertension {NUMBERS 1-12:18279} {days/wks/mos/yrs:310907} ago.  BP at that visit was ***. Management since that visit includes ***.  Symptoms: {Yes/No:20286} chest pain {Yes/No:20286} chest pressure  {Yes/No:20286} palpitations {Yes/No:20286} syncope  {Yes/No:20286} dyspnea {Yes/No:20286} orthopnea  {Yes/No:20286} paroxysmal nocturnal dyspnea {Yes/No:20286} lower extremity edema   Pertinent labs Lab Results  Component Value Date   CHOL 139 07/22/2020   HDL 45 07/22/2020   LDLCALC 64 07/22/2020   TRIG 180 (H) 07/22/2020   CHOLHDL 3.1 07/22/2020   Lab Results  Component Value Date   NA 147 (H) 10/29/2021   K 4.8 10/29/2021   CREATININE 1.34 (H) 10/29/2021   EGFR 56 (L) 10/29/2021   GLUCOSE 133 (H) 10/29/2021   TSH 1.529 12/26/2020     The 10-year ASCVD risk score (Arnett DK, et al., 2019) is: 43.7%  --------------------------------------------------------------------------------------------------- Follow up of Shortness of breath and Congestive heart failure  Medications: Outpatient Medications Prior to Visit  Medication Sig   atorvastatin (LIPITOR) 40 MG tablet Take 40 mg by mouth daily.   Blood Glucose Calibration (OT ULTRA/FASTTK  CNTRL SOLN) SOLN    diltiazem (CARDIZEM CD) 120 MG 24 hr capsule Take 1 capsule (120 mg total) by mouth daily.   Dulaglutide (TRULICITY) 1.5 QZ/0.0PQ SOPN Inject 1.5 mg into the skin once a week. Mondays   furosemide (LASIX) 40 MG tablet Take 1 tablet (40 mg total) by mouth 2 (two) times daily.   insulin NPH Human (NOVOLIN N) 100 UNIT/ML injection Inject into the skin.   insulin NPH-regular Human (70-30) 100 UNIT/ML injection Inject 20 Units into the skin 2 (two) times daily with a meal.   meclizine (ANTIVERT) 25 MG tablet TAKE 1 TABLET BY MOUTH THREE TIMES DAILY AS NEEDED FOR DIZZINESS (Patient not taking: Reported on 09/15/2021)   metFORMIN (GLUCOPHAGE) 1000 MG tablet TAKE 1 TABLET BY MOUTH TWICE DAILY WITH A MEAL (Patient taking differently: Take 1,000 mg by mouth in the morning and at bedtime.)   methimazole (TAPAZOLE) 5 MG tablet Take 2 tablets by mouth daily.   metoprolol succinate (TOPROL-XL) 100 MG 24 hr tablet metoprolol succinate ER 100 mg tablet,extended release 24 hr   Multiple Vitamin (MULTIVITAMIN WITH MINERALS) TABS tablet Take 1 tablet by mouth daily.   omeprazole (PRILOSEC) 40 MG capsule Take 1 capsule (40 mg total) by mouth daily.   ONETOUCH ULTRA test strip USE 1 STRIP TO CHECK GLUCOSE TWICE DAILY   PACERONE 200 MG tablet Take 200 mg by mouth daily. (Patient not taking: Reported on 10/29/2021)   sacubitril-valsartan (ENTRESTO) 24-26 MG Take 1 tablet by mouth daily.   sildenafil (VIAGRA) 100 MG tablet Take 1 tab 1 hour prior to intercourse  tamsulosin (FLOMAX) 0.4 MG CAPS capsule TAKE 2 CAPSULES BY MOUTH ONCE DAILY WITH LUNCH   traMADol (ULTRAM) 50 MG tablet every 6 (six) hours. (Patient not taking: Reported on 09/15/2021)   venlafaxine XR (EFFEXOR-XR) 150 MG 24 hr capsule Take 1 capsule (150 mg total) by mouth daily.   No facility-administered medications prior to visit.    Review of Systems  {Labs  Heme  Chem  Endocrine  Serology  Results Review (optional):23779}    Objective    There were no vitals taken for this visit. {Show previous vital signs (optional):23777}  Physical Exam  ***  No results found for any visits on 01/26/22.  Assessment & Plan     ***  No follow-ups on file.      {provider attestation***:1}   [name], MD  [facility] [phone] (phone) [phone] (fax)  [place]

## 2022-02-12 DIAGNOSIS — E119 Type 2 diabetes mellitus without complications: Secondary | ICD-10-CM | POA: Diagnosis not present

## 2022-02-12 DIAGNOSIS — I2721 Secondary pulmonary arterial hypertension: Secondary | ICD-10-CM | POA: Diagnosis not present

## 2022-02-12 DIAGNOSIS — R6889 Other general symptoms and signs: Secondary | ICD-10-CM | POA: Diagnosis not present

## 2022-02-18 ENCOUNTER — Encounter: Payer: Self-pay | Admitting: Family Medicine

## 2022-02-18 ENCOUNTER — Ambulatory Visit (INDEPENDENT_AMBULATORY_CARE_PROVIDER_SITE_OTHER): Payer: Medicare Other | Admitting: Family Medicine

## 2022-02-18 VITALS — BP 110/87 | HR 112 | Temp 97.7°F | Resp 16 | Wt 208.0 lb

## 2022-02-18 DIAGNOSIS — I5022 Chronic systolic (congestive) heart failure: Secondary | ICD-10-CM | POA: Diagnosis not present

## 2022-02-18 DIAGNOSIS — I480 Paroxysmal atrial fibrillation: Secondary | ICD-10-CM

## 2022-02-18 DIAGNOSIS — N183 Chronic kidney disease, stage 3 unspecified: Secondary | ICD-10-CM

## 2022-02-18 DIAGNOSIS — E1122 Type 2 diabetes mellitus with diabetic chronic kidney disease: Secondary | ICD-10-CM | POA: Diagnosis not present

## 2022-02-18 DIAGNOSIS — N1832 Chronic kidney disease, stage 3b: Secondary | ICD-10-CM

## 2022-02-18 DIAGNOSIS — I509 Heart failure, unspecified: Secondary | ICD-10-CM | POA: Diagnosis not present

## 2022-02-18 DIAGNOSIS — R2681 Unsteadiness on feet: Secondary | ICD-10-CM

## 2022-02-18 DIAGNOSIS — H6012 Cellulitis of left external ear: Secondary | ICD-10-CM | POA: Diagnosis not present

## 2022-02-18 DIAGNOSIS — E0590 Thyrotoxicosis, unspecified without thyrotoxic crisis or storm: Secondary | ICD-10-CM | POA: Diagnosis not present

## 2022-02-18 DIAGNOSIS — F331 Major depressive disorder, recurrent, moderate: Secondary | ICD-10-CM

## 2022-02-18 MED ORDER — AMOXICILLIN 500 MG PO TABS: 500.0000 mg | ORAL_TABLET | Freq: Three times a day (TID) | ORAL | 0 refills | Status: DC

## 2022-02-18 MED ORDER — EMPAGLIFLOZIN 25 MG PO TABS: 25.0000 mg | ORAL_TABLET | Freq: Every day | ORAL | 0 refills | Status: DC

## 2022-02-18 MED ORDER — FUROSEMIDE 40 MG PO TABS: 40.0000 mg | ORAL_TABLET | Freq: Two times a day (BID) | ORAL | 0 refills | Status: DC

## 2022-02-18 NOTE — Progress Notes (Deleted)
New Patient Office Visit  Subjective    Patient ID: Tom Moore, male    DOB: Jan 27, 1946  Age: 76 y.o. MRN: [id number]  CC:  Chief Complaint  Patient presents with   Follow-up    HPI Tom Moore presents to establish care ***  Outpatient Encounter Medications as of 02/18/2022  Medication Sig   atorvastatin (LIPITOR) 40 MG tablet Take 40 mg by mouth daily.   Blood Glucose Calibration (OT ULTRA/FASTTK CNTRL SOLN) SOLN    diltiazem (CARDIZEM CD) 120 MG 24 hr capsule Take 1 capsule (120 mg total) by mouth daily.   Dulaglutide (TRULICITY) 1.5 AS/5.0NL SOPN Inject 1.5 mg into the skin once a week. Mondays   empagliflozin (JARDIANCE) 25 MG TABS tablet Take 25 mg by mouth daily.   furosemide (LASIX) 40 MG tablet Take 1 tablet (40 mg total) by mouth 2 (two) times daily.   insulin NPH Human (NOVOLIN N) 100 UNIT/ML injection Inject into the skin.   insulin NPH-regular Human (70-30) 100 UNIT/ML injection Inject 20 Units into the skin 2 (two) times daily with a meal.   methimazole (TAPAZOLE) 5 MG tablet Take 2 tablets by mouth daily.   metoprolol succinate (TOPROL-XL) 100 MG 24 hr tablet metoprolol succinate ER 100 mg tablet,extended release 24 hr   Multiple Vitamin (MULTIVITAMIN WITH MINERALS) TABS tablet Take 1 tablet by mouth daily.   omeprazole (PRILOSEC) 40 MG capsule Take 1 capsule (40 mg total) by mouth daily.   ONETOUCH ULTRA test strip USE 1 STRIP TO CHECK GLUCOSE TWICE DAILY   PACERONE 200 MG tablet Take 200 mg by mouth daily.   sacubitril-valsartan (ENTRESTO) 24-26 MG Take 1 tablet by mouth daily.   sildenafil (VIAGRA) 100 MG tablet Take 1 tab 1 hour prior to intercourse   tamsulosin (FLOMAX) 0.4 MG CAPS capsule TAKE 2 CAPSULES BY MOUTH ONCE DAILY WITH LUNCH   traMADol (ULTRAM) 50 MG tablet    meclizine (ANTIVERT) 25 MG tablet TAKE 1 TABLET BY MOUTH THREE TIMES DAILY AS NEEDED FOR DIZZINESS (Patient not taking: Reported on 09/15/2021)   metFORMIN (GLUCOPHAGE) 1000 MG  tablet TAKE 1 TABLET BY MOUTH TWICE DAILY WITH A MEAL (Patient not taking: Reported on 02/18/2022)   venlafaxine XR (EFFEXOR-XR) 150 MG 24 hr capsule Take 1 capsule (150 mg total) by mouth daily.   No facility-administered encounter medications on file as of 02/18/2022.    Past Medical History:  Diagnosis Date   Anxiety    Arthritis    CHF (congestive heart failure) ([place])    Depression    Diabetes mellitus without complication (HCC)    Dysrhythmia    atrial fib   GERD (gastroesophageal reflux disease)    Hyperlipidemia    Hypertension    Hyperthyroidism    Insomnia    Neuromuscular disorder ([place])    diabetic neuropathy   Orthopnea    uses extra pillow to sleep   Sleep apnea    unable to tolerater CPAP   Wears dentures    Full upper, partial lower    Past Surgical History:  Procedure Laterality Date   CARDIAC CATHETERIZATION     CARPAL TUNNEL RELEASE Right 04/10/2018   Procedure: RELEASE MEDIAN NERVE AT CARPAL TUNNEL;  Surgeon: Earnestine Leys, MD;  Location: [facility];  Service: Orthopedics;  Laterality: Right;   CARPAL TUNNEL RELEASE Left 05/08/2018   Procedure: CARPAL TUNNEL RELEASE;  Surgeon: Earnestine Leys, MD;  Location: [facility];  Service: Orthopedics;  Laterality: Left;   CATARACT EXTRACTION  W/PHACO Right 08/16/2019   Procedure: CATARACT EXTRACTION PHACO AND INTRAOCULAR LENS PLACEMENT (IOC) RIGHT;  Surgeon: Marchia Meiers, MD;  Location: [facility];  Service: Ophthalmology;  Laterality: Right;  [place] 01:30.3 CDE 13.71 Fluid Pack lot # [id number]   CATARACT EXTRACTION W/PHACO Left 07/22/2021   Procedure: CATARACT EXTRACTION PHACO AND INTRAOCULAR LENS PLACEMENT (IOC) LEFT DIABETIC 5.29 01:01.6;  Surgeon: Leandrew Koyanagi, MD;  Location: [address];  Service: Ophthalmology;  Laterality: Left;  Diabetic   COLONOSCOPY WITH PROPOFOL N/A 06/04/2020   Procedure: COLONOSCOPY WITH PROPOFOL;  Surgeon: Virgel Manifold, MD;  Location: [facility];  Service: Endoscopy;   Laterality: N/A;   ESOPHAGOGASTRODUODENOSCOPY (EGD) WITH PROPOFOL N/A 06/04/2020   Procedure: ESOPHAGOGASTRODUODENOSCOPY (EGD) WITH PROPOFOL;  Surgeon: Virgel Manifold, MD;  Location: [facility];  Service: Endoscopy;  Laterality: N/A;   TONSILLECTOMY      Family History  Problem Relation Age of Onset   Hypertension Mother    Heart disease Mother    Mental illness Neg Hx     Social History   Socioeconomic History   Marital status: Widowed    Spouse name: Not on file   Number of children: 5   Years of education: Not on file   Highest education level: Not on file  Occupational History   Not on file  Tobacco Use   Smoking status: Former    Types: Cigarettes    Quit date: 04/03/1988    Years since quitting: 33.9   Smokeless tobacco: Never  Vaping Use   Vaping Use: Never used  Substance and Sexual Activity   Alcohol use: Not Currently    Comment: Ocassional alcohol use, Drinks wine.   Drug use: Never   Sexual activity: Not Currently  Other Topics Concern   Not on file  Social History Narrative   Not on file   Social Determinants of Health   Financial Resource Strain: Not on file  Food Insecurity: Not on file  Transportation Needs: Not on file  Physical Activity: Not on file  Stress: Not on file  Social Connections: Not on file  Intimate Partner Violence: Not on file    ROS      Objective    BP 110/87 (BP Location: Left Arm, Patient Position: Sitting, Cuff Size: Large)   Pulse (!) 112   Temp 97.7 F (36.5 C) (Oral)   Resp 16   Wt 208 lb (94.3 kg)   SpO2 99% Comment: room air  BMI 30.72 kg/m   Physical Exam  {Labs (Optional):23779}    Assessment & Plan:   Problem List Items Addressed This Visit   None   No follow-ups on file.   [name], MD

## 2022-02-18 NOTE — Progress Notes (Signed)
I,Roshena L Chambers,acting as a scribe for [name], MD.,have documented all relevant documentation on the behalf of [name], MD,as directed by  [name], MD while in the presence of [name], MD.   Established patient visit   Patient: Tom Moore   DOB: 1946/07/20   76 y.o. Male  MRN: [id number] Visit Date: 02/18/2022  Today's healthcare provider: [name], MD   Chief Complaint  Patient presents with   Follow-up   Subjective    HPI  Patient is here to discuss medication changes. He was living in [place] for the past few months. He reports while in [place] a doctor he was seeing changed his medications. Metformin was discontinued and changed to Jardiance '25mg'$  daily.  The medical records are in [place] but it appears that he was in some mild heart failure and was switched to the Jardiance from metformin for the heart failure and for chronic kidney disease.  I am very pleased with this change in his care.  Is also on Lasix for his heart failure.  He was having shortness of breath and orthopnea and all that has resolved.  He has appointment for follow-up with cardiology next week He is feeling better.  He evidently is getting ready to go back to [place] for a few months. His only acute problem today is tenderness and itching and swelling of his left pinna. He has follow-up with endocrinology regarding his diabetes next month   Medications: Outpatient Medications Prior to Visit  Medication Sig   atorvastatin (LIPITOR) 40 MG tablet Take 40 mg by mouth daily.   Blood Glucose Calibration (OT ULTRA/FASTTK CNTRL SOLN) SOLN    diltiazem (CARDIZEM CD) 120 MG 24 hr capsule Take 1 capsule (120 mg total) by mouth daily.   Dulaglutide (TRULICITY) 1.5 QH/4.7ML SOPN Inject 1.5 mg into the skin once a week. Mondays   empagliflozin (JARDIANCE) 25 MG TABS tablet Take 25 mg by mouth daily.   furosemide (LASIX) 40 MG tablet  Take 1 tablet (40 mg total) by mouth 2 (two) times daily.   insulin NPH Human (NOVOLIN N) 100 UNIT/ML injection Inject into the skin.   insulin NPH-regular Human (70-30) 100 UNIT/ML injection Inject 20 Units into the skin 2 (two) times daily with a meal.   methimazole (TAPAZOLE) 5 MG tablet Take 2 tablets by mouth daily.   metoprolol succinate (TOPROL-XL) 100 MG 24 hr tablet metoprolol succinate ER 100 mg tablet,extended release 24 hr   Multiple Vitamin (MULTIVITAMIN WITH MINERALS) TABS tablet Take 1 tablet by mouth daily.   omeprazole (PRILOSEC) 40 MG capsule Take 1 capsule (40 mg total) by mouth daily.   ONETOUCH ULTRA test strip USE 1 STRIP TO CHECK GLUCOSE TWICE DAILY   PACERONE 200 MG tablet Take 200 mg by mouth daily.   sacubitril-valsartan (ENTRESTO) 24-26 MG Take 1 tablet by mouth daily.   sildenafil (VIAGRA) 100 MG tablet Take 1 tab 1 hour prior to intercourse   tamsulosin (FLOMAX) 0.4 MG CAPS capsule TAKE 2 CAPSULES BY MOUTH ONCE DAILY WITH LUNCH   traMADol (ULTRAM) 50 MG tablet    meclizine (ANTIVERT) 25 MG tablet TAKE 1 TABLET BY MOUTH THREE TIMES DAILY AS NEEDED FOR DIZZINESS (Patient not taking: Reported on 09/15/2021)   metFORMIN (GLUCOPHAGE) 1000 MG tablet TAKE 1 TABLET BY MOUTH TWICE DAILY WITH A MEAL (Patient not taking: Reported on 02/18/2022)   venlafaxine XR (EFFEXOR-XR) 150 MG 24 hr  capsule Take 1 capsule (150 mg total) by mouth daily.   No facility-administered medications prior to visit.    Review of Systems  Constitutional:  Negative for appetite change, chills and fever.  Respiratory:  Negative for chest tightness, shortness of breath and wheezing.   Cardiovascular:  Negative for chest pain and palpitations.  Gastrointestinal:  Negative for abdominal pain, nausea and vomiting.   Last hemoglobin A1c Lab Results  Component Value Date   HGBA1C 7.0 (H) 11/04/2020       Objective    BP 110/87 (BP Location: Left Arm, Patient Position: Sitting, Cuff Size: Large)    Pulse (!) 112   Temp 97.7 F (36.5 C) (Oral)   Resp 16   Wt 208 lb (94.3 kg)   SpO2 99% Comment: room air  BMI 30.72 kg/m  BP Readings from Last 3 Encounters:  02/18/22 110/87  10/29/21 (!) 151/83  08/27/21 133/75   Wt Readings from Last 3 Encounters:  02/18/22 208 lb (94.3 kg)  10/29/21 220 lb (99.8 kg)  08/27/21 212 lb (96.2 kg)      Physical Exam Vitals and nursing note reviewed.  Constitutional:      Appearance: Normal appearance. He is normal weight.  HENT:     Head: Normocephalic and atraumatic.     Right Ear: External ear normal.     Ears:     Comments: Swelling of the medial portion of the left pinna out tenderness or drainage. Eyes:     General: No scleral icterus.    Conjunctiva/sclera: Conjunctivae normal.  Cardiovascular:     Rate and Rhythm: Normal rate and regular rhythm.     Pulses: Normal pulses.     Heart sounds: Normal heart sounds.  Pulmonary:     Effort: Pulmonary effort is normal.     Breath sounds: Normal breath sounds.  Abdominal:     General: There is no distension.     Palpations: Abdomen is soft.     Tenderness: There is no abdominal tenderness.  Genitourinary:    Penis: Normal.   Musculoskeletal:     Cervical back: Normal range of motion and neck supple.     Right lower leg: No edema.     Left lower leg: No edema.  Skin:    General: Skin is warm and dry.  Neurological:     General: No focal deficit present.     Mental Status: He is alert and oriented to person, place, and time.     Gait: Gait normal.  Psychiatric:        Mood and Affect: Mood normal.        Behavior: Behavior normal.        Thought Content: Thought content normal.        Judgment: Judgment normal.      No results found for any visits on 02/18/22.  Assessment & Plan     1. Congestive heart failure, unspecified HF chronicity, unspecified heart failure type [facility]) Patient improved on Jardiance and Lasix.  Weight is down 12 pounds from first of the year. - CBC  with Differential/Platelet - Pro b natriuretic peptide (BNP)  2. Chronic kidney disease, stage 3b (HCC) Metformin has been discontinued. He requests 90 days of refills for his medications and this is given today.  - CBC with Differential/Platelet - Renal Function Panel  3. Controlled type 2 diabetes mellitus with stage 3 chronic kidney disease, unspecified whether long term insulin use ([place]) Now on Jardiance at 25 mg.  I like the change. - CBC with Differential/Platelet - Renal Function Panel  4. Cellulitis of ear, left Treat cellulitis with Amoxil. - amoxicillin (AMOXIL) 500 MG tablet; Take 1 tablet (500 mg total) by mouth 3 (three) times daily.  Dispense: 15 tablet; Refill: 0   5. AF (paroxysmal atrial fibrillation) ([place])   6. Chronic systolic CHF (congestive heart failure) ([place])   7. Moderate episode of recurrent major depressive disorder (HCC) On venlafaxine  8. Unsteady gait Consider PT referral.  No follow-ups on file.      I, [name], MD, have reviewed all documentation for this visit. The documentation on 02/19/22 for the exam, diagnosis, procedures, and orders are all accurate and complete.    Imagine Nest Cranford Mon, MD  [facility] [phone] (phone) [phone] (fax)  [place]

## 2022-02-19 LAB — CBC WITH DIFFERENTIAL/PLATELET
Basophils Absolute: 0.1 10*3/uL (ref 0.0–0.2)
Basos: 1 %
EOS (ABSOLUTE): 0.5 10*3/uL — ABNORMAL HIGH (ref 0.0–0.4)
Eos: 7 %
Hematocrit: 41.8 % (ref 37.5–51.0)
Hemoglobin: 13.2 g/dL (ref 13.0–17.7)
Immature Grans (Abs): 0 10*3/uL (ref 0.0–0.1)
Immature Granulocytes: 0 %
Lymphocytes Absolute: 1.6 10*3/uL (ref 0.7–3.1)
Lymphs: 24 %
MCH: 29.3 pg (ref 26.6–33.0)
MCHC: 31.6 g/dL (ref 31.5–35.7)
MCV: 93 fL (ref 79–97)
Monocytes Absolute: 0.6 10*3/uL (ref 0.1–0.9)
Monocytes: 9 %
Neutrophils Absolute: 3.7 10*3/uL (ref 1.4–7.0)
Neutrophils: 59 %
Platelets: 204 10*3/uL (ref 150–450)
RBC: 4.5 x10E6/uL (ref 4.14–5.80)
RDW: 13.6 % (ref 11.6–15.4)
WBC: 6.4 10*3/uL (ref 3.4–10.8)

## 2022-02-19 LAB — RENAL FUNCTION PANEL
Albumin: 4.6 g/dL (ref 3.7–4.7)
BUN/Creatinine Ratio: 11 (ref 10–24)
BUN: 20 mg/dL (ref 8–27)
CO2: 24 mmol/L (ref 20–29)
Calcium: 9.5 mg/dL (ref 8.6–10.2)
Chloride: 104 mmol/L (ref 96–106)
Creatinine, Ser: 1.86 mg/dL — ABNORMAL HIGH (ref 0.76–1.27)
Glucose: 448 mg/dL — ABNORMAL HIGH (ref 70–99)
Phosphorus: 4.5 mg/dL — ABNORMAL HIGH (ref 2.8–4.1)
Potassium: 5 mmol/L (ref 3.5–5.2)
Sodium: 143 mmol/L (ref 134–144)
eGFR: 37 mL/min/{1.73_m2} — ABNORMAL LOW (ref >59)

## 2022-02-19 LAB — PRO B NATRIURETIC PEPTIDE: NT-Pro BNP: 2447 pg/mL — ABNORMAL HIGH (ref 0–486)

## 2022-02-23 ENCOUNTER — Other Ambulatory Visit: Payer: Self-pay

## 2022-02-23 MED ORDER — LANTUS SOLOSTAR 100 UNIT/ML ~~LOC~~ SOPN: 5.0000 [IU] | PEN_INJECTOR | Freq: Every day | SUBCUTANEOUS | 0 refills | Status: DC

## 2022-02-25 DIAGNOSIS — I10 Essential (primary) hypertension: Secondary | ICD-10-CM | POA: Diagnosis not present

## 2022-02-25 DIAGNOSIS — G4733 Obstructive sleep apnea (adult) (pediatric): Secondary | ICD-10-CM | POA: Diagnosis not present

## 2022-02-25 DIAGNOSIS — I5033 Acute on chronic diastolic (congestive) heart failure: Secondary | ICD-10-CM | POA: Diagnosis not present

## 2022-02-25 DIAGNOSIS — E782 Mixed hyperlipidemia: Secondary | ICD-10-CM | POA: Diagnosis not present

## 2022-02-25 DIAGNOSIS — N1832 Chronic kidney disease, stage 3b: Secondary | ICD-10-CM | POA: Diagnosis not present

## 2022-02-25 DIAGNOSIS — I480 Paroxysmal atrial fibrillation: Secondary | ICD-10-CM | POA: Diagnosis not present

## 2022-02-25 DIAGNOSIS — E1122 Type 2 diabetes mellitus with diabetic chronic kidney disease: Secondary | ICD-10-CM | POA: Diagnosis not present

## 2022-03-01 DIAGNOSIS — E1122 Type 2 diabetes mellitus with diabetic chronic kidney disease: Secondary | ICD-10-CM | POA: Diagnosis not present

## 2022-03-01 DIAGNOSIS — Z794 Long term (current) use of insulin: Secondary | ICD-10-CM | POA: Diagnosis not present

## 2022-03-01 DIAGNOSIS — N1831 Chronic kidney disease, stage 3a: Secondary | ICD-10-CM | POA: Diagnosis not present

## 2022-03-01 DIAGNOSIS — E0590 Thyrotoxicosis, unspecified without thyrotoxic crisis or storm: Secondary | ICD-10-CM | POA: Diagnosis not present

## 2022-03-01 LAB — COMPREHENSIVE METABOLIC PANEL
Calcium: 9.5 (ref 8.7–10.7)
eGFR: 39

## 2022-03-01 LAB — PROTEIN / CREATININE RATIO, URINE: Albumin, U: 12

## 2022-03-01 LAB — BASIC METABOLIC PANEL
BUN: 22 — AB (ref 4–21)
CO2: 29 — AB (ref 13–22)
Chloride: 107 (ref 99–108)
Creatinine: 1.7 — AB (ref <=1.3)
Glucose: 147
Potassium: 4.1 mEq/L (ref 3.5–5.1)
Sodium: 143 (ref 137–147)

## 2022-03-01 LAB — MICROALBUMIN / CREATININE URINE RATIO: Microalb Creat Ratio: 36.6

## 2022-03-01 LAB — TSH: TSH: 0.43 (ref <=5.90)

## 2022-03-01 LAB — MICROALBUMIN, URINE: Microalb, Ur: 32.8

## 2022-03-01 LAB — HEMOGLOBIN A1C: Hemoglobin A1C: 8.4

## 2022-03-08 ENCOUNTER — Ambulatory Visit: Payer: Medicare Other | Admitting: Urology

## 2022-03-08 ENCOUNTER — Encounter: Payer: Self-pay | Admitting: Urology

## 2022-03-08 VITALS — BP 148/80 | HR 72 | Ht 69.0 in | Wt 208.0 lb

## 2022-03-08 DIAGNOSIS — N401 Enlarged prostate with lower urinary tract symptoms: Secondary | ICD-10-CM

## 2022-03-08 DIAGNOSIS — R351 Nocturia: Secondary | ICD-10-CM

## 2022-03-08 LAB — URINALYSIS, COMPLETE
Bilirubin, UA: NEGATIVE
Ketones, UA: NEGATIVE
Leukocytes,UA: NEGATIVE
Nitrite, UA: NEGATIVE
Protein,UA: NEGATIVE
RBC, UA: NEGATIVE
Specific Gravity, UA: 1.015 (ref 1.005–1.030)
Urobilinogen, Ur: 0.2 mg/dL (ref 0.2–1.0)
pH, UA: 5.5 (ref 5.0–7.5)

## 2022-03-08 LAB — MICROSCOPIC EXAMINATION: Bacteria, UA: NONE SEEN

## 2022-03-08 LAB — PSA: PSA: 1.2

## 2022-03-08 MED ORDER — MIRABEGRON ER 50 MG PO TB24: 50.0000 mg | ORAL_TABLET | Freq: Every day | ORAL | 0 refills | Status: DC

## 2022-03-09 ENCOUNTER — Encounter: Payer: Self-pay | Admitting: Urology

## 2022-03-09 LAB — PSA: Prostate Specific Ag, Serum: 1.2 ng/mL (ref 0.0–4.0)

## 2022-03-09 NOTE — Progress Notes (Signed)
03/08/2022 7:21 AM   Tom Moore November 01, 1945 [id number]  Referring provider: Jerrol Banana., MD [address] [place],  [place] [address]  Chief Complaint  Patient presents with   Other    nocturina    Urologic history:  1.  BPH with LUTS Combination therapy tamsulosin/dutasteride  2.  Nocturia Previous treatments Nocdurna and IR oxybutynin at bedtime   HPI: 76 y.o. male seen December 2022 for annual visit who called for an earlier visit to discuss lower urinary tract symptoms.  He remains on tamsulosin it does not appear he is taking dutasteride. He has noted some increased urinary frequency and urgency.  He occasionally notes minimal dampness in his underwear when he wakes up in the morning. Complains of persistent nocturia at 3-4 times per night. IPSS completed today 21/35. States his brother was recently diagnosed with prostate cancer and he also requested a PSA  PMH: Past Medical History:  Diagnosis Date   Anxiety    Arthritis    CHF (congestive heart failure) ([place])    Depression    Diabetes mellitus without complication ([place])    Dysrhythmia    atrial fib   GERD (gastroesophageal reflux disease)    Hyperlipidemia    Hypertension    Hyperthyroidism    Insomnia    Neuromuscular disorder ([place])    diabetic neuropathy   Orthopnea    uses extra pillow to sleep   Sleep apnea    unable to tolerater CPAP   Wears dentures    Full upper, partial lower    Surgical History: Past Surgical History:  Procedure Laterality Date   CARDIAC CATHETERIZATION     CARPAL TUNNEL RELEASE Right 04/10/2018   Procedure: RELEASE MEDIAN NERVE AT CARPAL TUNNEL;  Surgeon: Earnestine Leys, MD;  Location: [facility];  Service: Orthopedics;  Laterality: Right;   CARPAL TUNNEL RELEASE Left 05/08/2018   Procedure: CARPAL TUNNEL RELEASE;  Surgeon: Earnestine Leys, MD;  Location: [facility];  Service: Orthopedics;  Laterality: Left;   CATARACT EXTRACTION W/PHACO Right  08/16/2019   Procedure: CATARACT EXTRACTION PHACO AND INTRAOCULAR LENS PLACEMENT ([place]) RIGHT;  Surgeon: Marchia Meiers, MD;  Location: [facility];  Service: Ophthalmology;  Laterality: Right;  [place] 01:30.3 CDE 13.71 Fluid Pack lot # [id number]   CATARACT EXTRACTION W/PHACO Left 07/22/2021   Procedure: CATARACT EXTRACTION PHACO AND INTRAOCULAR LENS PLACEMENT ([facility]) LEFT DIABETIC 5.29 01:01.6;  Surgeon: Leandrew Koyanagi, MD;  Location: [place];  Service: Ophthalmology;  Laterality: Left;  Diabetic   COLONOSCOPY WITH PROPOFOL N/A 06/04/2020   Procedure: COLONOSCOPY WITH PROPOFOL;  Surgeon: Virgel Manifold, MD;  Location: [facility];  Service: Endoscopy;  Laterality: N/A;   ESOPHAGOGASTRODUODENOSCOPY (EGD) WITH PROPOFOL N/A 06/04/2020   Procedure: ESOPHAGOGASTRODUODENOSCOPY (EGD) WITH PROPOFOL;  Surgeon: Virgel Manifold, MD;  Location: [facility];  Service: Endoscopy;  Laterality: N/A;   TONSILLECTOMY      Home Medications:  Allergies as of 03/08/2022       Reactions   Bupropion Other (See Comments)   Tremors, balance issues, cognitive impairment   Zyrtec Allergy [cetirizine] Diarrhea        Medication List        Accurate as of March 08, 2022 11:59 PM. If you have any questions, ask your nurse or doctor.          amoxicillin 500 MG tablet Commonly known as: AMOXIL Take 1 tablet (500 mg total) by mouth 3 (three) times daily.   atorvastatin 40 MG tablet  Commonly known as: LIPITOR Take 40 mg by mouth daily.   diltiazem 120 MG 24 hr capsule Commonly known as: CARDIZEM CD Take 1 capsule (120 mg total) by mouth daily.   empagliflozin 25 MG Tabs tablet Commonly known as: Jardiance Take 1 tablet (25 mg total) by mouth daily.   furosemide 40 MG tablet Commonly known as: LASIX Take 1 tablet (40 mg total) by mouth 2 (two) times daily.   insulin NPH Human 100 UNIT/ML injection Commonly known as: NOVOLIN N Inject into the skin.   insulin NPH-regular Human  (70-30) 100 UNIT/ML injection Inject 20 Units into the skin 2 (two) times daily with a meal.   Lantus SoloStar 100 UNIT/ML Solostar Pen Generic drug: insulin glargine Inject 5 Units into the skin daily.   meclizine 25 MG tablet Commonly known as: ANTIVERT TAKE 1 TABLET BY MOUTH THREE TIMES DAILY AS NEEDED FOR DIZZINESS   metFORMIN 1000 MG tablet Commonly known as: GLUCOPHAGE TAKE 1 TABLET BY MOUTH TWICE DAILY WITH A MEAL   methimazole 5 MG tablet Commonly known as: TAPAZOLE Take 2 tablets by mouth daily.   metoprolol succinate 100 MG 24 hr tablet Commonly known as: TOPROL-XL metoprolol succinate ER 100 mg tablet,extended release 24 hr   mirabegron ER 50 MG Tb24 tablet Commonly known as: MYRBETRIQ Take 1 tablet (50 mg total) by mouth daily. Started by: Abbie Sons, MD   multivitamin with minerals Tabs tablet Take 1 tablet by mouth daily.   omeprazole 40 MG capsule Commonly known as: PRILOSEC Take 1 capsule (40 mg total) by mouth daily.   OneTouch Ultra test strip Generic drug: glucose blood USE 1 STRIP TO CHECK GLUCOSE TWICE DAILY   OT ULTRA/FASTTK CNTRL SOLN Soln   Pacerone 200 MG tablet Generic drug: amiodarone Take 200 mg by mouth daily.   sacubitril-valsartan 24-26 MG Commonly known as: ENTRESTO Take 1 tablet by mouth daily.   sildenafil 100 MG tablet Commonly known as: Viagra Take 1 tab 1 hour prior to intercourse   tamsulosin 0.4 MG Caps capsule Commonly known as: FLOMAX TAKE 2 CAPSULES BY MOUTH ONCE DAILY WITH LUNCH   traMADol 50 MG tablet Commonly known as: ULTRAM   Trulicity 1.5 VH/8.4ON Sopn Generic drug: Dulaglutide Inject 1.5 mg into the skin once a week. Mondays   venlafaxine XR 150 MG 24 hr capsule Commonly known as: EFFEXOR-XR Take 1 capsule (150 mg total) by mouth daily.        Allergies:  Allergies  Allergen Reactions   Bupropion Other (See Comments)    Tremors, balance issues, cognitive impairment   Zyrtec Allergy  [Cetirizine] Diarrhea    Family History: Family History  Problem Relation Age of Onset   Hypertension Mother    Heart disease Mother    Mental illness Neg Hx     Social History:  reports that he quit smoking about 33 years ago. His smoking use included cigarettes. He has never used smokeless tobacco. He reports that he does not currently use alcohol. He reports that he does not use drugs.   Physical Exam: BP (!) 148/80   Pulse 72   Ht '5\' 9"'$  (1.753 m)   Wt 208 lb (94.3 kg)   BMI 30.72 kg/m   Constitutional:  Alert and oriented, No acute distress. HEENT: [place] AT, moist mucus membranes.  Trachea midline, no masses. Cardiovascular: No clubbing, cyanosis, or edema. Respiratory: Normal respiratory effort, no increased work of breathing. GI: Abdomen is soft, nontender, nondistended, no abdominal masses Psychiatric:  Normal mood and affect.   Assessment & Plan:    1. Benign prostatic hyperplasia with lower urinary tract symptoms, symptom details unspecified Worsening storage related voiding symptoms His symptoms are bothersome enough that he desires additional medication. Given samples of Myrbetriq 50 mg daily x4 weeks and will call back regarding efficacy PSA was drawn at his request  2.  Nocturia He has failed Nocdurna and immediate release oxybutynin Has previously been diagnosed with sleep apnea but not on CPAP due to inability to tolerate I again discussed that sleep apnea is a common cause of nocturia. He states his PCP diagnosed him with a sleep apnea and recommend he follow-up to see if there is any new or devices he may be able to tolerate better   Keep scheduled follow-up   Abbie Sons, MD  [facility] [address], [place], [place] [address] [phone]

## 2022-03-10 NOTE — Progress Notes (Signed)
I,Jermiyah Ricotta Robinson,acting as a Education administrator for [employer], PA-C.,have documented all relevant documentation on the behalf of Mardene Speak, PA-C,as directed by  [employer], PA-C while in the presence of [employer], PA-C.  Annual Wellness Visit     Patient: Tom Moore, Male    DOB: 09-26-1946, 76 y.o.   MRN: [id number] Visit Date: 03/12/2022  Today's Provider: Mardene Speak, PA-C   No chief complaint on file.  Subjective    Tom Moore is a 76 y.o. male who presents today for his Annual Wellness Visit. He reports consuming a general diet. The patient has a physically strenuous job, but has no regular exercise apart from work.  He generally feels poorly. He reports sleeping poorly. He does not have additional problems to discuss today.   Diabetes Mellitus Type II, Follow-up  Lab Results  Component Value Date   HGBA1C 7.0 (H) 11/04/2020   HGBA1C 7.4 (A) 06/20/2018   HGBA1C 6.8 (A) 03/09/2018   Wt Readings from Last 3 Encounters:  03/12/22 208 lb 1.6 oz (94.4 kg)  03/08/22 208 lb (94.3 kg)  02/18/22 208 lb (94.3 kg)   Last seen for diabetes on 02-18-22. Management since then includes continue jardiance 25 mg and Metformin 1000 MG once daily. He reports excellent compliance with treatment. He is not having side effects.  Symptoms: No fatigue No foot ulcerations  Yes appetite changes No nausea  No paresthesia of the feet  No polydipsia  No polyuria No visual disturbances   No vomiting     Home blood sugar records: fasting range: 88-100  Episodes of hypoglycemia? No  Current insulin regiment:Lantus 50 units daily into skin.  Last eye exam: Oct '22 07/22/21 Cataract extraction and intraocular lens placement by Dr Wallace Going  Pertinent Labs: Lab Results  Component Value Date   CHOL 139 07/22/2020   HDL 45 07/22/2020   LDLCALC 64 07/22/2020   TRIG 180 (H) 07/22/2020   CHOLHDL 3.1 07/22/2020   Lab Results  Component Value Date   NA 143 02/18/2022   K  5.0 02/18/2022   CREATININE 1.86 (H) 02/18/2022   EGFR 37 (L) 02/18/2022   LABMICR See below: 03/08/2022     --------------------------------------------------------------------------------------------------- Follow up for Congestive heart failure   The patient was last seen for this 02-18-22. Changes made at last visit include continue current medication.  He reports excellent compliance with treatment. He feels that condition is Improved. He is not having side effects.   -----------------------------------------------------------------------------------------  Medications: Outpatient Medications Prior to Visit  Medication Sig   atorvastatin (LIPITOR) 40 MG tablet Take 40 mg by mouth daily.   Blood Glucose Calibration (OT ULTRA/FASTTK CNTRL SOLN) SOLN    Dulaglutide (TRULICITY) 1.5 WY/6.3ZC SOPN Inject 1.5 mg into the skin once a week. Mondays   empagliflozin (JARDIANCE) 25 MG TABS tablet Take 1 tablet (25 mg total) by mouth daily.   furosemide (LASIX) 40 MG tablet Take 1 tablet (40 mg total) by mouth 2 (two) times daily.   insulin glargine (LANTUS SOLOSTAR) 100 UNIT/ML Solostar Pen Inject 5 Units into the skin daily.   insulin NPH-regular Human (70-30) 100 UNIT/ML injection Inject 20 Units into the skin 2 (two) times daily with a meal.   metFORMIN (GLUCOPHAGE) 1000 MG tablet Take 1,000 mg by mouth daily with breakfast.   methimazole (TAPAZOLE) 5 MG tablet Take 2.5 tablets by mouth daily.   metoprolol succinate (TOPROL-XL) 100 MG 24 hr tablet metoprolol succinate ER 100 mg tablet,extended release 24  hr   mirabegron ER (MYRBETRIQ) 50 MG TB24 tablet Take 1 tablet (50 mg total) by mouth daily.   Multiple Vitamin (MULTIVITAMIN WITH MINERALS) TABS tablet Take 1 tablet by mouth daily.   omeprazole (PRILOSEC) 40 MG capsule Take 1 capsule (40 mg total) by mouth daily.   ONETOUCH ULTRA test strip USE 1 STRIP TO CHECK GLUCOSE TWICE DAILY   PACERONE 200 MG tablet Take 200 mg by mouth daily.    sildenafil (VIAGRA) 100 MG tablet Take 1 tab 1 hour prior to intercourse   spironolactone (ALDACTONE) 25 MG tablet Take 25 mg by mouth daily.   sucralfate (CARAFATE) 1 g tablet Take 1 g by mouth 4 (four) times daily -  with meals and at bedtime.   tamsulosin (FLOMAX) 0.4 MG CAPS capsule TAKE 2 CAPSULES BY MOUTH ONCE DAILY WITH LUNCH   traMADol (ULTRAM) 50 MG tablet    amoxicillin (AMOXIL) 500 MG tablet Take 1 tablet (500 mg total) by mouth 3 (three) times daily.   diltiazem (CARDIZEM CD) 120 MG 24 hr capsule Take 1 capsule (120 mg total) by mouth daily. (Patient not taking: Reported on 03/12/2022)   insulin NPH Human (NOVOLIN N) 100 UNIT/ML injection Inject into the skin. (Patient not taking: Reported on 03/12/2022)   sacubitril-valsartan (ENTRESTO) 24-26 MG Take 1 tablet by mouth daily. (Patient not taking: Reported on 03/12/2022)   venlafaxine XR (EFFEXOR-XR) 150 MG 24 hr capsule Take 1 capsule (150 mg total) by mouth daily.   No facility-administered medications prior to visit.    Allergies  Allergen Reactions   Bupropion Other (See Comments)    Tremors, balance issues, cognitive impairment   Zyrtec Allergy [Cetirizine] Diarrhea    Patient Care Team: Maple Hudson., MD as PCP - General (Family Medicine)  Review of Systems  All other systems reviewed and are negative.       Objective    Vitals: BP 106/74 (BP Location: Right Arm, Patient Position: Sitting, Cuff Size: Normal)   Pulse (!) 106   Temp 98 F (36.7 C) (Oral)   Resp 16   Wt 208 lb 1.6 oz (94.4 kg)   SpO2 100%   BMI 30.73 kg/m     Physical Exam Vitals reviewed.  Constitutional:      General: He is not in acute distress.    Appearance: Normal appearance. He is well-developed. He is obese. He is not diaphoretic.  HENT:     Head: Normocephalic and atraumatic.     Right Ear: Tympanic membrane, ear canal and external ear normal.     Left Ear: Tympanic membrane and ear canal normal.     Nose: Nose  normal.     Mouth/Throat:     Mouth: Mucous membranes are moist.     Pharynx: Oropharynx is clear. No oropharyngeal exudate.     Comments: Wears dentures      Full upper, partial lower   Eyes:     General: No scleral icterus.    Conjunctiva/sclera: Conjunctivae normal.     Pupils: Pupils are equal, round, and reactive to light.  Neck:     Thyroid: No thyromegaly.  Cardiovascular:     Rate and Rhythm: Normal rate and regular rhythm.     Pulses: Normal pulses.     Heart sounds: Normal heart sounds. No murmur heard. Pulmonary:     Effort: Pulmonary effort is normal. No respiratory distress.     Breath sounds: Normal breath sounds. No wheezing or rales.  Abdominal:  General: There is no distension.     Palpations: Abdomen is soft.     Tenderness: There is no abdominal tenderness.  Musculoskeletal:        General: No swelling, tenderness or deformity. Normal range of motion.     Cervical back: Neck supple.     Right lower leg: No edema.     Left lower leg: No edema.     Comments: Varicose veins / left lower leg Brown hyperpigmented areas /lower extremities below the knees  Lymphadenopathy:     Cervical: No cervical adenopathy.  Skin:    General: Skin is warm and dry.     Findings: No rash.     Comments: A few SKs on the face  Neurological:     Mental Status: He is alert and oriented to person, place, and time. Mental status is at baseline.     Sensory: No sensory deficit.     Motor: No weakness.     Gait: Gait normal.  Psychiatric:        Mood and Affect: Mood normal.        Behavior: Behavior normal.        Thought Content: Thought content normal.        Judgment: Judgment normal.     Most recent functional status assessment:    03/12/2022   10:13 AM  In your present state of health, do you have any difficulty performing the following activities:  Hearing? 0  Vision? 0  Difficulty concentrating or making decisions? 1  Walking or climbing stairs? 1  Dressing  or bathing? 0  Doing errands, shopping? 0   Most recent fall risk assessment:    03/12/2022   10:13 AM  Fall Risk   Falls in the past year? 0  Number falls in past yr: 0  Injury with Fall? 0  Follow up Falls evaluation completed    Most recent depression screenings:    03/12/2022   10:13 AM 02/18/2022    8:29 AM  PHQ 2/9 Scores  PHQ - 2 Score 3 2  PHQ- 9 Score 15 6   Most recent cognitive screening:    12/05/2018   10:34 AM  6CIT Screen  What Year? 0 points  What month? 0 points  What time? 0 points  Count back from 20 0 points  Months in reverse 0 points  Repeat phrase 0 points  Total Score 0 points   Most recent Audit-C alcohol use screening    03/12/2022   10:13 AM  Alcohol Use Disorder Test (AUDIT)  1. How often do you have a drink containing alcohol? 2  2. How many drinks containing alcohol do you have on a typical day when you are drinking? 0  3. How often do you have six or more drinks on one occasion? 1  AUDIT-C Score 3   A score of 3 or more in women, and 4 or more in men indicates increased risk for alcohol abuse, EXCEPT if all of the points are from question 1   No results found for any visits on 03/12/22.  Assessment & Plan     Annual wellness visit done today including the all of the following: Reviewed patient's Family Medical History Reviewed and updated list of patient's medical providers Assessment of cognitive impairment was done Assessed patient's functional ability Established a written schedule for health screening [place] Completed and Reviewed  Exercise Activities and Dietary recommendations  Goals       "  I would like to feel better (pt-stated)      Current Barriers:  Financial constraints Mental Health Concerns   Clinical Social Work Clinical Goal(s):  Over the next 30  days, client will work with SW to address concerns related to his symptoms of depression  Interventions: Patient interviewed and  appropriate assessments performed Continued to provide mental health counseling and emotional support with regard to patient's symptoms of depression  Patient continues to report no side affects with the current anti-depressant prescribed and feels that the medication has been effective Continued to emphasized self care and utilizing healthy coping strategies for depression Discussed plans with patient for ongoing care management follow up and provided patient with direct contact information for care management team Patient continues to deny need for outpatient therapy referral at this time   Patient Self Care Activities:  Self administers medications as prescribed Attends all scheduled provider appointments Performs ADL's independently Performs IADL's independently  Please see past updates related to this goal by clicking on the "Past Updates" button in the selected goal          Immunization History  Administered Date(s) Administered   PFIZER(Purple Top)SARS-COV-2 Vaccination 12/02/2019, 12/26/2019   Pneumococcal Polysaccharide-23 10/22/2020    Health Maintenance  Topic Date Due   Zoster Vaccines- Shingrix (1 of 2) Never done   COVID-19 Vaccine (3 - Pfizer risk series) 01/23/2020   HEMOGLOBIN A1C  05/04/2021   OPHTHALMOLOGY EXAM  05/21/2021   Pneumonia Vaccine 9+ Years old (2 - PCV) 10/22/2021   FOOT EXAM  04/16/2022 (Originally 01/31/1956)   TETANUS/TDAP  04/16/2022 (Originally 01/31/1965)   INFLUENZA VACCINE  04/27/2022   COLONOSCOPY (Pts 45-58yrs Insurance coverage will need to be confirmed)  06/04/2022   Hepatitis C Screening  Completed   HPV VACCINES  Aged Out    1. Encounter for annual wellness exam in Medicare patient  2. Controlled type 2 diabetes mellitus with stage 3 chronic kidney disease, unspecified whether long term insulin use ([place]) Managed by Endocrinology, Germain Osgood  3. Chronic systolic CHF (congestive heart failure) ([place]) Managed by  Cardiology/[place] , Montey Hora  4. Stasis dermatitis of both legs  Discussed health benefits of physical activity, and encouraged him to engage in regular exercise appropriate for his age and condition.   5. HLD Advised to add OTC omega-3 to his current regimen  6. GERD Chronic. Stable Continue PPI/Nexium Elevate the head of the bed 6-8 inches, avoid recumbency for 3 hours after eating, avoid food as a delayed gastric emptying, weight loss     Fu with Dr. Rosanna Randy in October Needs a confirmation for an eye exam   The patient was advised to call back or seek an in-person evaluation if the symptoms worsen or if the condition fails to improve as anticipated.  I discussed the assessment and treatment plan with the patient. The patient was provided an opportunity to ask questions and all were answered. The patient agreed with the plan and demonstrated an understanding of the instructions.  The entirety of the information documented in the History of Present Illness, Review of Systems and Physical Exam were personally obtained by me. Portions of this information were initially documented by the CMA and reviewed by me for thoroughness and accuracy.  Portions of this note were created using dictation software and may contain typographical errors.    Mardene Speak, PA-C  [facility] [phone] (phone) [phone] (fax) [place]

## 2022-03-11 ENCOUNTER — Telehealth: Payer: Self-pay | Admitting: Urology

## 2022-03-11 DIAGNOSIS — I5033 Acute on chronic diastolic (congestive) heart failure: Secondary | ICD-10-CM | POA: Diagnosis not present

## 2022-03-11 NOTE — Telephone Encounter (Signed)
Left message for pt to return call.

## 2022-03-11 NOTE — Telephone Encounter (Signed)
PSA was 1.2 and is stable.  Please verify if he is still taking dutasteride.  It is no longer on his med list.

## 2022-03-12 ENCOUNTER — Ambulatory Visit (INDEPENDENT_AMBULATORY_CARE_PROVIDER_SITE_OTHER): Payer: Medicare Other | Admitting: Physician Assistant

## 2022-03-12 ENCOUNTER — Encounter: Payer: Self-pay | Admitting: Physician Assistant

## 2022-03-12 VITALS — BP 106/74 | HR 106 | Temp 98.0°F | Resp 16 | Wt 208.1 lb

## 2022-03-12 DIAGNOSIS — I5022 Chronic systolic (congestive) heart failure: Secondary | ICD-10-CM

## 2022-03-12 DIAGNOSIS — I872 Venous insufficiency (chronic) (peripheral): Secondary | ICD-10-CM | POA: Diagnosis not present

## 2022-03-12 DIAGNOSIS — E782 Mixed hyperlipidemia: Secondary | ICD-10-CM

## 2022-03-12 DIAGNOSIS — E1122 Type 2 diabetes mellitus with diabetic chronic kidney disease: Secondary | ICD-10-CM

## 2022-03-12 DIAGNOSIS — K219 Gastro-esophageal reflux disease without esophagitis: Secondary | ICD-10-CM | POA: Diagnosis not present

## 2022-03-12 DIAGNOSIS — Z0000 Encounter for general adult medical examination without abnormal findings: Secondary | ICD-10-CM | POA: Diagnosis not present

## 2022-03-12 DIAGNOSIS — N183 Chronic kidney disease, stage 3 unspecified: Secondary | ICD-10-CM | POA: Diagnosis not present

## 2022-03-12 MED ORDER — MIRABEGRON ER 50 MG PO TB24: 50.0000 mg | ORAL_TABLET | Freq: Every day | ORAL | 1 refills | Status: DC

## 2022-03-12 NOTE — Telephone Encounter (Signed)
Patient is no longer taking Dutasteride. He is currently on Myrbetriq and patient states it is working well. He requested a 90 day supply sent to pharmacy. [place] is going [place] the [place] and would like to have the medication. RX sent.

## 2022-03-16 ENCOUNTER — Other Ambulatory Visit: Payer: Self-pay | Admitting: Family Medicine

## 2022-03-16 DIAGNOSIS — E1122 Type 2 diabetes mellitus with diabetic chronic kidney disease: Secondary | ICD-10-CM

## 2022-03-16 MED ORDER — LANTUS SOLOSTAR 100 UNIT/ML ~~LOC~~ SOPN: 5.0000 [IU] | PEN_INJECTOR | Freq: Every day | SUBCUTANEOUS | 0 refills | Status: DC

## 2022-03-17 DIAGNOSIS — R296 Repeated falls: Secondary | ICD-10-CM | POA: Diagnosis not present

## 2022-03-17 DIAGNOSIS — E782 Mixed hyperlipidemia: Secondary | ICD-10-CM | POA: Diagnosis not present

## 2022-03-17 DIAGNOSIS — Z7901 Long term (current) use of anticoagulants: Secondary | ICD-10-CM | POA: Diagnosis not present

## 2022-03-17 DIAGNOSIS — R001 Bradycardia, unspecified: Secondary | ICD-10-CM | POA: Diagnosis not present

## 2022-03-17 DIAGNOSIS — N1832 Chronic kidney disease, stage 3b: Secondary | ICD-10-CM | POA: Diagnosis not present

## 2022-03-17 DIAGNOSIS — I480 Paroxysmal atrial fibrillation: Secondary | ICD-10-CM | POA: Diagnosis not present

## 2022-03-17 DIAGNOSIS — R42 Dizziness and giddiness: Secondary | ICD-10-CM | POA: Diagnosis not present

## 2022-03-17 DIAGNOSIS — I739 Peripheral vascular disease, unspecified: Secondary | ICD-10-CM | POA: Diagnosis not present

## 2022-03-17 DIAGNOSIS — I5022 Chronic systolic (congestive) heart failure: Secondary | ICD-10-CM | POA: Diagnosis not present

## 2022-03-17 DIAGNOSIS — R55 Syncope and collapse: Secondary | ICD-10-CM | POA: Diagnosis not present

## 2022-03-17 DIAGNOSIS — G4733 Obstructive sleep apnea (adult) (pediatric): Secondary | ICD-10-CM | POA: Diagnosis not present

## 2022-03-17 DIAGNOSIS — I10 Essential (primary) hypertension: Secondary | ICD-10-CM | POA: Diagnosis not present

## 2022-03-20 ENCOUNTER — Other Ambulatory Visit: Payer: Self-pay | Admitting: Family Medicine

## 2022-03-20 DIAGNOSIS — J309 Allergic rhinitis, unspecified: Secondary | ICD-10-CM

## 2022-03-22 DIAGNOSIS — Z7901 Long term (current) use of anticoagulants: Secondary | ICD-10-CM | POA: Diagnosis not present

## 2022-03-22 DIAGNOSIS — I480 Paroxysmal atrial fibrillation: Secondary | ICD-10-CM | POA: Diagnosis not present

## 2022-03-23 DIAGNOSIS — R001 Bradycardia, unspecified: Secondary | ICD-10-CM | POA: Diagnosis not present

## 2022-03-23 DIAGNOSIS — I5022 Chronic systolic (congestive) heart failure: Secondary | ICD-10-CM | POA: Diagnosis not present

## 2022-03-23 DIAGNOSIS — G4733 Obstructive sleep apnea (adult) (pediatric): Secondary | ICD-10-CM | POA: Diagnosis not present

## 2022-03-23 DIAGNOSIS — I480 Paroxysmal atrial fibrillation: Secondary | ICD-10-CM | POA: Diagnosis not present

## 2022-03-23 DIAGNOSIS — R296 Repeated falls: Secondary | ICD-10-CM | POA: Diagnosis not present

## 2022-03-23 DIAGNOSIS — Z7901 Long term (current) use of anticoagulants: Secondary | ICD-10-CM | POA: Diagnosis not present

## 2022-03-23 DIAGNOSIS — E782 Mixed hyperlipidemia: Secondary | ICD-10-CM | POA: Diagnosis not present

## 2022-03-23 DIAGNOSIS — R42 Dizziness and giddiness: Secondary | ICD-10-CM | POA: Diagnosis not present

## 2022-03-23 DIAGNOSIS — I10 Essential (primary) hypertension: Secondary | ICD-10-CM | POA: Diagnosis not present

## 2022-03-23 DIAGNOSIS — I739 Peripheral vascular disease, unspecified: Secondary | ICD-10-CM | POA: Diagnosis not present

## 2022-03-23 DIAGNOSIS — N1832 Chronic kidney disease, stage 3b: Secondary | ICD-10-CM | POA: Diagnosis not present

## 2022-03-23 DIAGNOSIS — R55 Syncope and collapse: Secondary | ICD-10-CM | POA: Diagnosis not present

## 2022-04-07 ENCOUNTER — Other Ambulatory Visit: Payer: Self-pay | Admitting: Family Medicine

## 2022-04-07 NOTE — Telephone Encounter (Signed)
Requested Prescriptions  Pending Prescriptions Disp Refills  . omeprazole (PRILOSEC) 40 MG capsule [Pharmacy Med Name: Omeprazole 40 MG Oral Capsule Delayed Release] 90 capsule 0    Sig: Take 1 capsule by mouth once daily     Gastroenterology: Proton Pump Inhibitors Passed - 04/07/2022 10:18 AM      Passed - Valid encounter within last 12 months    Recent Outpatient Visits          1 month ago Congestive heart failure, unspecified HF chronicity, unspecified heart failure type [facility])   [facility] Jerrol Banana., MD   5 months ago Shortness of breath   [facility] Jerrol Banana., MD   7 months ago Diarrhea, unspecified type   [facility] Mikey Kirschner, PA-C   9 months ago Controlled type 2 diabetes mellitus with stage 3 chronic kidney disease, unspecified whether long term insulin use [facility])   [facility] Jerrol Banana., MD   11 months ago Hypokalemia   [facility] Charlynne Cousins, MD      Future Appointments            In 3 months Jerrol Banana., MD [facility], [place]   In 4 months [place], Ronda Fairly, MD [facility]

## 2022-06-02 ENCOUNTER — Other Ambulatory Visit: Payer: Self-pay | Admitting: Family Medicine

## 2022-06-02 NOTE — Telephone Encounter (Signed)
Patient's sister called and stated that patient needed a refill for the following:  Requested Prescriptions   Pending Prescriptions Disp Refills   furosemide (LASIX) 40 MG tablet 180 tablet 0    Sig: Take 1 tablet (40 mg total) by mouth 2 (two) times daily.   tamsulosin (FLOMAX) 0.4 MG CAPS capsule 180 capsule 0    Sig: TAKE 2 CAPSULES BY MOUTH ONCE DAILY WITH LUNCH   Sent to [facility] [address], [place]

## 2022-06-03 NOTE — Telephone Encounter (Signed)
last RF 06/02/22 #180  Too soon Refused Flomax- med refilled by urologist Dr Bernardo Heater  Requested Prescriptions  Refused Prescriptions Disp Refills  . furosemide (LASIX) 40 MG tablet 180 tablet 0    Sig: Take 1 tablet (40 mg total) by mouth 2 (two) times daily.     Cardiovascular:  Diuretics - Loop Failed - 06/02/2022  9:47 AM      Failed - Cr in normal range and within 180 days    Creatinine  Date Value Ref Range Status  01/01/2014 1.47 (H) 0.60 - 1.30 mg/dL Final   Creatinine, Ser  Date Value Ref Range Status  02/18/2022 1.86 (H) 0.76 - 1.27 mg/dL Final         Failed - Mg Level in normal range and within 180 days    Magnesium  Date Value Ref Range Status  12/26/2020 1.7 1.7 - 2.4 mg/dL Final    Comment:    Performed at [facility], [address] [place], [place] [id number]  01/01/2014 1.2 (L) mg/dL Final    Comment:    1.8-2.4 THERAPEUTIC RANGE: 4-7 mg/dL TOXIC: > 10 mg/dL  -----------------------          Passed - K in normal range and within 180 days    Potassium  Date Value Ref Range Status  02/18/2022 5.0 3.5 - 5.2 mmol/L Final  01/01/2014 3.0 (L) 3.5 - 5.1 mmol/L Final         Passed - Ca in normal range and within 180 days    Calcium  Date Value Ref Range Status  02/18/2022 9.5 8.6 - 10.2 mg/dL Final   Calcium, Total  Date Value Ref Range Status  01/01/2014 8.9 8.5 - 10.1 mg/dL Final         Passed - Na in normal range and within 180 days    Sodium  Date Value Ref Range Status  02/18/2022 143 134 - 144 mmol/L Final  01/01/2014 135 (L) 136 - 145 mmol/L Final         Passed - Cl in normal range and within 180 days    Chloride  Date Value Ref Range Status  02/18/2022 104 96 - 106 mmol/L Final  01/01/2014 99 98 - 107 mmol/L Final         Passed - Last BP in normal range    BP Readings from Last 1 Encounters:  03/12/22 106/74         Passed - Valid encounter within last 6 months    Recent Outpatient Visits          3 months ago  Congestive heart failure, unspecified HF chronicity, unspecified heart failure type [facility])   [facility] Jerrol Banana., MD   7 months ago Shortness of breath   [facility] Jerrol Banana., MD   9 months ago Diarrhea, unspecified type   [facility] Thedore Mins, [place], PA-C   11 months ago Controlled type 2 diabetes mellitus with stage 3 chronic kidney disease, unspecified whether long term insulin use [facility])   [facility] Jerrol Banana., MD   1 year ago Hypokalemia   [facility] Charlynne Cousins, MD      Future Appointments            In 1 month Jerrol Banana., MD [facility]   In 2 months Stoioff, Ronda Fairly, MD [place]           .  tamsulosin (FLOMAX) 0.4 MG CAPS capsule 180 capsule 0    Sig: TAKE 2 CAPSULES BY MOUTH ONCE DAILY WITH LUNCH     Urology: Alpha-Adrenergic Blocker Passed - 06/02/2022  9:47 AM      Passed - PSA in normal range and within 360 days    PSA  Date Value Ref Range Status  01/31/2015 2.0  Final   Prostate Specific Ag, Serum  Date Value Ref Range Status  03/08/2022 1.2 0.0 - 4.0 ng/mL Final    Comment:    Roche ECLIA methodology. According to the American Urological Association, Serum PSA should decrease and remain at undetectable levels after radical prostatectomy. The AUA defines biochemical recurrence as an initial PSA value 0.2 ng/mL or greater followed by a subsequent confirmatory PSA value 0.2 ng/mL or greater. Values obtained with different assay methods or kits cannot be used interchangeably. Results cannot be interpreted as absolute evidence of the presence or absence of malignant disease.          Passed - Last BP in normal range    BP Readings from Last 1 Encounters:  03/12/22 106/74         Passed - Valid encounter within last 12 months    Recent Outpatient Visits          3 months ago Congestive  heart failure, unspecified HF chronicity, unspecified heart failure type [facility])   [facility] Jerrol Banana., MD   7 months ago Shortness of breath   [facility] Jerrol Banana., MD   9 months ago Diarrhea, unspecified type   [facility] Mikey Kirschner, PA-C   11 months ago Controlled type 2 diabetes mellitus with stage 3 chronic kidney disease, unspecified whether long term insulin use [facility])   [facility] Jerrol Banana., MD   1 year ago Hypokalemia   [facility] Charlynne Cousins, MD      Future Appointments            In 1 month Jerrol Banana., MD [facility], [place]   In 2 months [place], Ronda Fairly, [facility]

## 2022-07-01 LAB — POCT INR: INR: 1.4 — AB (ref <=1.20)

## 2022-07-06 ENCOUNTER — Ambulatory Visit (INDEPENDENT_AMBULATORY_CARE_PROVIDER_SITE_OTHER): Payer: Medicare Other | Admitting: Family Medicine

## 2022-07-06 ENCOUNTER — Encounter: Payer: Self-pay | Admitting: Family Medicine

## 2022-07-06 VITALS — BP 98/63 | HR 48 | Temp 98.2°F | Resp 16 | Ht 69.0 in | Wt 210.8 lb

## 2022-07-06 DIAGNOSIS — F331 Major depressive disorder, recurrent, moderate: Secondary | ICD-10-CM | POA: Diagnosis not present

## 2022-07-06 DIAGNOSIS — M1712 Unilateral primary osteoarthritis, left knee: Secondary | ICD-10-CM

## 2022-07-06 DIAGNOSIS — F418 Other specified anxiety disorders: Secondary | ICD-10-CM

## 2022-07-06 MED ORDER — BUSPIRONE HCL 7.5 MG PO TABS: 7.5000 mg | ORAL_TABLET | Freq: Two times a day (BID) | ORAL | 1 refills | Status: AC

## 2022-07-06 MED ORDER — ALPRAZOLAM 0.5 MG PO TABS: 0.5000 mg | ORAL_TABLET | Freq: Three times a day (TID) | ORAL | 0 refills | Status: AC | PRN

## 2022-07-06 MED ORDER — ALPRAZOLAM 0.5 MG PO TABS: 0.5000 mg | ORAL_TABLET | Freq: Three times a day (TID) | ORAL | 0 refills | Status: DC | PRN

## 2022-07-06 NOTE — Assessment & Plan Note (Signed)
Chronic, uncontrolled Patient reports SE with venlafaxine and is no longer taking this, he requests removal from med list  Med list updated  Will start trial of buspar 7.'5mg'$  twice daily  Follow up in 10 days for mood check prior to leaving for trip to [place]

## 2022-07-06 NOTE — Assessment & Plan Note (Signed)
Chronic, stable  He is no longer taking tramadol for this problem  Handicap placard form completed for temp placard Completed physician portion and returned to patient  Updated for 6 months

## 2022-07-06 NOTE — Assessment & Plan Note (Signed)
Intermittent Related to plane ride  Will fill for 12 tablets Xanax 0.'5mg'$  to be taken PRN 3 TID

## 2022-07-06 NOTE — Progress Notes (Signed)
I,[name],acting as a scribe for [employer], MD.,have documented all relevant documentation on the behalf of Tom Foster, MD,as directed by  Tom Foster, MD while in the presence of Tom Foster, MD.   Established patient visit   Patient: Tom Moore   DOB: 1946-04-30   76 y.o. Male  MRN: [id number] Visit Date: 07/06/2022  Today's healthcare provider: [name], MD   Chief Complaint  Patient presents with   establishing care   Subjective    HPI  Patient here to meet new provider. Needs a placard form for his OA of his knee   Depression  Patient states that he would like to be on medication for deep depression  That worsened after he lost his son in a motorcycle accident He is planning to travel to [place] later this month  He states that he often feels lonely  He states that he normally loves traveling but is less enthusiastic due to feeling down  He denies wanting to end his life but does endorse times of passive SI  He is hoping to start a different medication than he has tried in the past   [employer] Visit from 07/06/2022 in [place]  PHQ-9 Total Score 22       Situational Anxiety  He also would like a medication for anxiety just for traveling. He states that the flight to [place] is over 10 hours long and he usually has a hard time sleeping on the flight and feels nervous while traveling  He states that in the past his PCP has given him Xanax to help with these symptoms    OA, Knee  Patient reports hx of OA in his knee and is requesting re-certification of a handicap placard    Medications: Outpatient Medications Prior to Visit  Medication Sig   atorvastatin (LIPITOR) 40 MG tablet Take 40 mg by mouth daily.   Dulaglutide (TRULICITY) 1.5 YS/0.6TK SOPN Inject 1.5 mg into the skin once a week. Mondays   EQ ALLERGY RELIEF, CETIRIZINE, 10 MG tablet Take 1  tablet by mouth once daily   furosemide (LASIX) 40 MG tablet Take 1 tablet by mouth twice daily   insulin glargine (LANTUS SOLOSTAR) 100 UNIT/ML Solostar Pen Inject 5 Units into the skin daily.   JARDIANCE 25 MG TABS tablet Take 1 tablet by mouth once daily   losartan (COZAAR) 25 MG tablet Take 25 mg by mouth daily.   metFORMIN (GLUCOPHAGE) 1000 MG tablet Take 1,000 mg by mouth daily with breakfast.   methimazole (TAPAZOLE) 5 MG tablet Take 2.5 tablets by mouth daily.   midodrine (PROAMATINE) 2.5 MG tablet Take 2.5 mg by mouth 3 (three) times daily.   mirabegron ER (MYRBETRIQ) 50 MG TB24 tablet Take 1 tablet (50 mg total) by mouth daily.   Multiple Vitamin (MULTIVITAMIN WITH MINERALS) TABS tablet Take 1 tablet by mouth daily.   omeprazole (PRILOSEC) 40 MG capsule Take 1 capsule by mouth once daily   ONETOUCH ULTRA test strip USE 1 STRIP TO CHECK GLUCOSE TWICE DAILY   spironolactone (ALDACTONE) 25 MG tablet Take 25 mg by mouth daily.   sucralfate (CARAFATE) 1 g tablet Take 1 g by mouth 4 (four) times daily -  with meals and at bedtime.   tamsulosin (FLOMAX) 0.4 MG CAPS capsule TAKE 2 CAPSULES BY MOUTH ONCE DAILY WITH LUNCH   warfarin (COUMADIN) 5 MG tablet Take by mouth.   Blood Glucose Calibration (OT ULTRA/FASTTK CNTRL  SOLN) SOLN    diltiazem (CARDIZEM CD) 120 MG 24 hr capsule Take 1 capsule (120 mg total) by mouth daily.   insulin NPH Human (NOVOLIN N) 100 UNIT/ML injection Inject into the skin.   insulin NPH-regular Human (70-30) 100 UNIT/ML injection Inject 20 Units into the skin 2 (two) times daily with a meal.   metoprolol succinate (TOPROL-XL) 100 MG 24 hr tablet metoprolol succinate ER 100 mg tablet,extended release 24 hr   PACERONE 200 MG tablet Take 200 mg by mouth daily.   sacubitril-valsartan (ENTRESTO) 24-26 MG Take 1 tablet by mouth daily. (Patient not taking: Reported on 03/12/2022)   [DISCONTINUED] amoxicillin (AMOXIL) 500 MG tablet Take 1 tablet (500 mg total) by mouth 3  (three) times daily.   [DISCONTINUED] sildenafil (VIAGRA) 100 MG tablet Take 1 tab 1 hour prior to intercourse   [DISCONTINUED] traMADol (ULTRAM) 50 MG tablet    [DISCONTINUED] venlafaxine XR (EFFEXOR-XR) 150 MG 24 hr capsule Take 1 capsule (150 mg total) by mouth daily.   No facility-administered medications prior to visit.    Review of Systems     Objective    BP 98/63 (BP Location: Left Arm, Patient Position: Sitting, Cuff Size: Large)   Pulse (!) 48   Temp 98.2 F (36.8 C) (Oral)   Resp 16   Ht _0  (1.753 m)   Wt 210 lb 12.8 oz (95.6 kg)   SpO2 97%   BMI 31.13 kg/m    Physical Exam Constitutional:      General: He is not in acute distress.    Appearance: He is not ill-appearing.  Cardiovascular:     Rate and Rhythm: Normal rate. Rhythm irregular.  Pulmonary:     Effort: Pulmonary effort is normal. No respiratory distress.     Breath sounds: No wheezing, rhonchi or rales.  Abdominal:     Tenderness: There is no abdominal tenderness.  Neurological:     Mental Status: He is oriented to person, place, and time.  Psychiatric:        Attention and Perception: Attention normal.        Mood and Affect: Mood is depressed. Affect is not tearful.        Speech: Speech is tangential.        Behavior: Behavior normal. Behavior is cooperative.       Results for orders placed or performed in visit on 07/06/22  Microalbumin, urine  Result Value Ref Range   Microalb, Ur 32.8   Microalbumin / creatinine urine ratio  Result Value Ref Range   Microalb Creat Ratio 36.6   Protein / creatinine ratio, urine  Result Value Ref Range   Albumin, U 12   Basic metabolic panel  Result Value Ref Range   Glucose 147    BUN 22 (A) 4 - 21   CO2 29 (A) 13 - 22   Creatinine 1.7 (A) 0.6 - 1.3   Potassium 4.1 3.5 - 5.1 mEq/L   Sodium 143 137 - 147   Chloride 107 99 - 108  Comprehensive metabolic panel  Result Value Ref Range   eGFR 39    Calcium 9.5 8.7 - 10.7  Hemoglobin A1c   Result Value Ref Range   Hemoglobin A1C 8.4   PSA  Result Value Ref Range   PSA 1.2   TSH  Result Value Ref Range   TSH 0.43 0.41 - 5.90  POCT INR  Result Value Ref Range   INR 1.40 (A) 0.80 - 1.20  Assessment & Plan     Problem List Items Addressed This Visit       Musculoskeletal and Integument   Osteoarthritis, knee    Chronic, stable  He is no longer taking tramadol for this problem  Handicap placard form completed for temp placard Completed physician portion and returned to patient  Updated for 6 months         Other   Depression - Primary    Chronic, uncontrolled Patient reports SE with venlafaxine and is no longer taking this, he requests removal from med list  Med list updated  Will start trial of buspar 7.102m twice daily  Follow up in 10 days for mood check prior to leaving for trip [place]      Relevant Medications   busPIRone (BUSPAR) 7.5 MG tablet   ALPRAZolam (XANAX) 0.5 MG tablet   Situational anxiety    Intermittent Related to plane ride  Will fill for 12 tablets Xanax 0.565mto be taken PRN 3 TID       Relevant Medications   busPIRone (BUSPAR) 7.5 MG tablet   ALPRAZolam (XANAX) 0.5 MG tablet     Return in about 10 days (around 07/16/2022) for MDD .      I, MaEulis FosterMD, have reviewed all documentation for this visit. The documentation on 07/06/22 for the exam, diagnosis, procedures, and orders are all accurate and complete.    MaEulis FosterMD  [phone]) [phone])  [place]

## 2022-07-14 ENCOUNTER — Ambulatory Visit: Payer: Medicare Other | Admitting: Family Medicine

## 2022-07-14 NOTE — Progress Notes (Signed)
I,Tom Moore,acting as a scribe for [employer], MD.,have documented all relevant documentation on the behalf of Tom Foster, MD,as directed by  Tom Foster, MD while in the presence of Tom Foster, MD.   Established patient visit   Patient: Tom Moore   DOB: [birth year]   76 y.o. Male  MRN: [id number] Visit Date: 07/16/2022  Today's healthcare provider: [name], MD   Chief Complaint  Patient presents with   Follow-Up Depression   Subjective    HPI  Depression, Follow-up  He  was last seen for this 10 days ago. Changes made at last visit include start trial of buspar 7.'5mg'$  twice daily .   He reports excellent compliance with treatment. He is not having side effects.   He reports good tolerance of treatment. Current symptoms include: difficulty concentrating He feels he is Improved since last visit. Reports some improvement.     07/16/2022   10:18 AM 07/06/2022   11:25 AM 07/06/2022   11:01 AM  Depression screen PHQ 2/9  Decreased Interest '1 3 1  '$ Down, Depressed, Hopeless '1 3 1  '$ PHQ - 2 Score '2 6 2  '$ Altered sleeping '2 2 1  '$ Tired, decreased energy '1 3 1  '$ Change in appetite '2 3 1  '$ Feeling bad or failure about yourself  '1 3 2  '$ Trouble concentrating '1 3 2  '$ Moving slowly or fidgety/restless '1 1 2  '$ Suicidal thoughts '1 1 1  '$ PHQ-9 Score '11 22 12  '$ Difficult doing work/chores Somewhat difficult  Somewhat difficult    -----------------------------------------------------------------------------------------  DM Patient reports he is no longer able to afford the jardiance '25mg'$  as it was costing him $400 for a month's prescription now  He is requesting alternative medication that is affordable  He has previously tried [place] and reportedly was unable to tolerate this medication  Patient is leaving for [place] in 4 days and is requesting a medication to take with him as he will be there for 3  months, returning at the end of Jan 24  He reports that his last A1c was 8.4 and that he normally has fasting blood glucose that is no higher than 120, states he does not check postprandial glucoses He is managed by endocrinology for this with next appt in feb 2024, he is waiting to discuss trulicity dose of 3.'0mg'$  weekly with his endocrinologist as the price of his GLP 1 medication as recently increased to over $700 dollars and is not affordable any more    Medications: Outpatient Medications Prior to Visit  Medication Sig   ALPRAZolam (XANAX) 0.5 MG tablet Take 1 tablet (0.5 mg total) by mouth 3 (three) times daily as needed for anxiety.   atorvastatin (LIPITOR) 40 MG tablet Take 40 mg by mouth daily.   Blood Glucose Calibration (OT ULTRA/FASTTK CNTRL SOLN) SOLN    busPIRone (BUSPAR) 7.5 MG tablet Take 1 tablet (7.5 mg total) by mouth 2 (two) times daily.   diltiazem (CARDIZEM CD) 120 MG 24 hr capsule Take 1 capsule (120 mg total) by mouth daily.   Dulaglutide (TRULICITY) 1.5 QB/1.6XI SOPN Inject 1.5 mg into the skin once a week. Mondays   EQ ALLERGY RELIEF, CETIRIZINE, 10 MG tablet Take 1 tablet by mouth once daily   furosemide (LASIX) 40 MG tablet Take 1 tablet by mouth twice daily   insulin glargine (LANTUS SOLOSTAR) 100 UNIT/ML Solostar Pen Inject 5 Units into the skin daily.   metFORMIN (GLUCOPHAGE) 1000 MG tablet Take 1,000  mg by mouth daily with breakfast.   methimazole (TAPAZOLE) 5 MG tablet Take 2.5 tablets by mouth daily.   metoprolol succinate (TOPROL-XL) 100 MG 24 hr tablet metoprolol succinate ER 100 mg tablet,extended release 24 hr   Multiple Vitamin (MULTIVITAMIN WITH MINERALS) TABS tablet Take 1 tablet by mouth daily.   omeprazole (PRILOSEC) 40 MG capsule Take 1 capsule by mouth once daily   ONETOUCH ULTRA test strip USE 1 STRIP TO CHECK GLUCOSE TWICE DAILY   PACERONE 200 MG tablet Take 200 mg by mouth daily.   spironolactone (ALDACTONE) 25 MG tablet Take 25 mg by mouth  daily.   sucralfate (CARAFATE) 1 g tablet Take 1 g by mouth 4 (four) times daily -  with meals and at bedtime.   tamsulosin (FLOMAX) 0.4 MG CAPS capsule TAKE 2 CAPSULES BY MOUTH ONCE DAILY WITH LUNCH   warfarin (COUMADIN) 5 MG tablet Take by mouth.   [DISCONTINUED] JARDIANCE 25 MG TABS tablet Take 1 tablet by mouth once daily   [DISCONTINUED] insulin NPH Human (NOVOLIN N) 100 UNIT/ML injection Inject into the skin.   [DISCONTINUED] insulin NPH-regular Human (70-30) 100 UNIT/ML injection Inject 20 Units into the skin 2 (two) times daily with a meal.   [DISCONTINUED] losartan (COZAAR) 25 MG tablet Take 25 mg by mouth daily.   [DISCONTINUED] midodrine (PROAMATINE) 2.5 MG tablet Take 2.5 mg by mouth 3 (three) times daily. (Patient not taking: Reported on 07/16/2022)   [DISCONTINUED] mirabegron ER (MYRBETRIQ) 50 MG TB24 tablet Take 1 tablet (50 mg total) by mouth daily. (Patient not taking: Reported on 07/16/2022)   [DISCONTINUED] sacubitril-valsartan (ENTRESTO) 24-26 MG Take 1 tablet by mouth daily. (Patient not taking: Reported on 03/12/2022)   No facility-administered medications prior to visit.    Review of Systems     Objective    BP (!) 101/58 (BP Location: Left Arm, Patient Position: Sitting, Cuff Size: Normal)   Pulse 69   Resp 16   Wt 212 lb 6.4 oz (96.3 kg)   BMI 31.37 kg/m    Physical Exam Vitals reviewed.  Constitutional:      General: He is not in acute distress.    Appearance: Normal appearance. He is not ill-appearing, toxic-appearing or diaphoretic.  Eyes:     Conjunctiva/sclera: Conjunctivae normal.  Cardiovascular:     Rate and Rhythm: Normal rate. Rhythm irregular.     Pulses: Normal pulses.     Heart sounds: Normal heart sounds. No murmur heard.    No friction rub. No gallop.  Pulmonary:     Effort: Pulmonary effort is normal. No respiratory distress.     Breath sounds: Normal breath sounds. No stridor. No wheezing, rhonchi or rales.  Abdominal:     General:  Bowel sounds are normal. There is no distension.     Palpations: Abdomen is soft.     Tenderness: There is no abdominal tenderness.  Musculoskeletal:     Right lower leg: No edema.     Left lower leg: No edema.  Skin:    Findings: No erythema or rash.  Neurological:     Mental Status: He is alert and oriented to person, place, and time.  Psychiatric:        Attention and Perception: Attention and perception normal. He is attentive.        Mood and Affect: Mood normal. Mood is not anxious or depressed. Affect is not tearful.        Speech: Speech normal. Speech is not rapid and pressured,  slurred or tangential.        Behavior: Behavior normal. Behavior is cooperative.        Thought Content: Thought content does not include suicidal ideation. Thought content does not include suicidal plan.     Comments: Describes mood as "fine"       No results found for any visits on 07/16/22.  Assessment & Plan     Problem List Items Addressed This Visit       Endocrine   Type 2 diabetes mellitus without complication, with long-term current use of insulin (HCC)    Chronic, stable  Follows with endocrinology, Dr. Gabriel Carina He will continue Trulicity, current dose 1.5 mg weekly, he is waiting to discuss if this dose should be increased to 3.0 mg weekly I have ordered Invokana 300 mg daily to replace the Jardiance due to affordability issues He will continue to monitor his fasting blood glucoses as recommended per endocrinology Patient will continue Lantus 5 units daily Follow-up with endocrinology as previously scheduled        Relevant Medications   canagliflozin (INVOKANA) 300 MG TABS tablet     Other   Depression - Primary    Overall improved Patient PHQ-9 improved from 22-11 today He denies suicidal ideation or plan Patient would like to continue BuSpar 7.5 mg twice daily He will follow-up in 3 months after his return from [place]         Return in about 3 months (around  10/16/2022) for mood.      I, Tom Foster, MD, have reviewed all documentation for this visit. The documentation on 07/16/22 for the exam, diagnosis, procedures, and orders are all accurate and complete.    Tom Foster, MD  [facility] [phone] (phone) [phone] (fax)  [place]

## 2022-07-16 ENCOUNTER — Encounter: Payer: Self-pay | Admitting: Family Medicine

## 2022-07-16 ENCOUNTER — Ambulatory Visit (INDEPENDENT_AMBULATORY_CARE_PROVIDER_SITE_OTHER): Payer: Medicare Other | Admitting: Family Medicine

## 2022-07-16 VITALS — BP 101/58 | HR 69 | Resp 16 | Wt 212.4 lb

## 2022-07-16 DIAGNOSIS — E119 Type 2 diabetes mellitus without complications: Secondary | ICD-10-CM

## 2022-07-16 DIAGNOSIS — Z794 Long term (current) use of insulin: Secondary | ICD-10-CM | POA: Diagnosis not present

## 2022-07-16 DIAGNOSIS — F320 Major depressive disorder, single episode, mild: Secondary | ICD-10-CM

## 2022-07-16 MED ORDER — MIDODRINE HCL 2.5 MG PO TABS: 2.5000 mg | ORAL_TABLET | Freq: Three times a day (TID) | ORAL | 0 refills | Status: AC

## 2022-07-16 MED ORDER — MIRABEGRON ER 50 MG PO TB24: 50.0000 mg | ORAL_TABLET | Freq: Every day | ORAL | 1 refills | Status: DC

## 2022-07-16 MED ORDER — CANAGLIFLOZIN 300 MG PO TABS: 300.0000 mg | ORAL_TABLET | Freq: Every day | ORAL | 5 refills | Status: AC

## 2022-07-16 NOTE — Patient Instructions (Addendum)
It was a pleasure to see you today!  Thank you for choosing [facility] for your primary care.   Tom Moore was seen for depression follow up.   Our plans for today were: I am glad to see you are feeling better. Please continue your buspar 7.'5mg'$  twice daily.  I have prescribed Ivokana to replace the Jardiance due to cost changes.   To keep you healthy, please keep in mind the following health maintenance items that you are due for:   Tetanus vaccine  Diabetes eye exam  COVID vaccine Colonoscopy    You should return to our clinic in 3 months for follow up for chronic conditions.   Best Wishes,   Dr. Quentin [place]

## 2022-07-16 NOTE — Assessment & Plan Note (Addendum)
Chronic, stable  Follows with endocrinology, Dr. Gabriel Carina He will continue Trulicity, current dose 1.5 mg weekly, he is waiting to discuss if this dose should be increased to 3.0 mg weekly I have ordered Invokana 300 mg daily to replace the Jardiance due to affordability issues He will continue to monitor his fasting blood glucoses as recommended per endocrinology Patient will continue Lantus 5 units daily Follow-up with endocrinology as previously scheduled

## 2022-07-16 NOTE — Assessment & Plan Note (Signed)
Overall improved Patient PHQ-9 improved from 22-11 today He denies suicidal ideation or plan Patient would like to continue BuSpar 7.5 mg twice daily He will follow-up in 3 months after his return from [place]

## 2022-08-27 ENCOUNTER — Ambulatory Visit: Payer: Medicare Other | Admitting: Urology

## 2022-08-27 ENCOUNTER — Encounter: Payer: Self-pay | Admitting: Urology

## 2022-11-04 ENCOUNTER — Encounter: Payer: Self-pay | Admitting: Urology

## 2022-11-04 ENCOUNTER — Ambulatory Visit: Payer: Medicare Other | Admitting: Urology

## 2022-11-04 VITALS — BP 110/72 | HR 64 | Ht 69.0 in | Wt 215.0 lb

## 2022-11-04 DIAGNOSIS — N5201 Erectile dysfunction due to arterial insufficiency: Secondary | ICD-10-CM

## 2022-11-04 DIAGNOSIS — N401 Enlarged prostate with lower urinary tract symptoms: Secondary | ICD-10-CM

## 2022-11-04 LAB — BLADDER SCAN AMB NON-IMAGING: Scan Result: 12

## 2022-11-04 MED ORDER — TADALAFIL 20 MG PO TABS: ORAL_TABLET | ORAL | 0 refills | Status: DC

## 2022-11-04 MED ORDER — FINASTERIDE 5 MG PO TABS: 5.0000 mg | ORAL_TABLET | Freq: Every day | ORAL | 3 refills | Status: DC

## 2022-11-04 NOTE — Progress Notes (Signed)
11/04/2022 2:44 PM   Tom Moore 1946-01-29 [id number]  Referring provider: [name], MD [address] [place],  [place] [address]  Chief Complaint  Patient presents with   Benign Prostatic Hypertrophy    Urologic history:  1.  BPH with LUTS Tamsulosin  2.  Nocturia Previous treatments Nocdurna and IR oxybutynin at bedtime   HPI: 77 y.o. called for a follow-up visit as he is getting ready to leave for [place] and will be gone for 3 months  Was seen June 2023 with worsening storage related voiding symptoms.  He was given Myrbetriq samples which did improve his symptoms which he has continued this medication along with tamsulosin He does have occasional frequency, urgency and nocturia x 2.  IPSS completed today 10/35 which is improved from his last visit at 21/35 He states in the morning prior to voiding he notes small amount of dampness in his underwear but no definite incontinence He also complains of difficulty achieving and maintaining an erection.  He has been on sildenafil 100 mg in the past with variable results which he tolerated without problems  PMH: Past Medical History:  Diagnosis Date   Anxiety    Arthritis    CHF (congestive heart failure) (HCC)    Depression    Diabetes mellitus without complication (HCC)    Dysrhythmia    atrial fib   GERD (gastroesophageal reflux disease)    Hyperlipidemia    Hypertension    Hyperthyroidism    Insomnia    Neuromuscular disorder ([place])    diabetic neuropathy   Orthopnea    uses extra pillow to sleep   Sleep apnea    unable to tolerater CPAP   Wears dentures    Full upper, partial lower    Surgical History: Past Surgical History:  Procedure Laterality Date   CARDIAC CATHETERIZATION     CARPAL TUNNEL RELEASE Right 04/10/2018   Procedure: RELEASE MEDIAN NERVE AT CARPAL TUNNEL;  Surgeon: Earnestine Leys, MD;  Location: [facility];  Service: Orthopedics;  Laterality: Right;    CARPAL TUNNEL RELEASE Left 05/08/2018   Procedure: CARPAL TUNNEL RELEASE;  Surgeon: Earnestine Leys, MD;  Location: [facility];  Service: Orthopedics;  Laterality: Left;   CATARACT EXTRACTION W/PHACO Right 08/16/2019   Procedure: CATARACT EXTRACTION PHACO AND INTRAOCULAR LENS PLACEMENT ([place]) RIGHT;  Surgeon: Marchia Meiers, MD;  Location: [facility];  Service: Ophthalmology;  Laterality: Right;  [place] 01:30.3 CDE 13.71 Fluid Pack lot # [id number]   CATARACT EXTRACTION W/PHACO Left 07/22/2021   Procedure: CATARACT EXTRACTION PHACO AND INTRAOCULAR LENS PLACEMENT ([facility]) LEFT DIABETIC 5.29 01:01.6;  Surgeon: Leandrew Koyanagi, MD;  Location: [place];  Service: Ophthalmology;  Laterality: Left;  Diabetic   COLONOSCOPY WITH PROPOFOL N/A 06/04/2020   Procedure: COLONOSCOPY WITH PROPOFOL;  Surgeon: Virgel Manifold, MD;  Location: [facility];  Service: Endoscopy;  Laterality: N/A;   ESOPHAGOGASTRODUODENOSCOPY (EGD) WITH PROPOFOL N/A 06/04/2020   Procedure: ESOPHAGOGASTRODUODENOSCOPY (EGD) WITH PROPOFOL;  Surgeon: Virgel Manifold, MD;  Location: [facility];  Service: Endoscopy;  Laterality: N/A;   TONSILLECTOMY      Home Medications:  Allergies as of 11/04/2022       Reactions   Bupropion Other (See Comments)   Tremors, balance issues, cognitive impairment   Zyrtec Allergy [cetirizine] Diarrhea        Medication List        Accurate as of November 04, 2022  2:44 PM. If you have any questions, ask your nurse  or doctor.          ALPRAZolam 0.5 MG tablet Commonly known as: XANAX Take 1 tablet (0.5 mg total) by mouth 3 (three) times daily as needed for anxiety.   atorvastatin 40 MG tablet Commonly known as: LIPITOR Take 40 mg by mouth daily.   busPIRone 7.5 MG tablet Commonly known as: BUSPAR Take 1 tablet (7.5 mg total) by mouth 2 (two) times daily.   canagliflozin 300 MG Tabs tablet Commonly known as: INVOKANA Take 1 tablet (300 mg total) by mouth daily before  breakfast.   diltiazem 120 MG 24 hr capsule Commonly known as: CARDIZEM CD Take 1 capsule (120 mg total) by mouth daily.   EQ Allergy Relief (Cetirizine) 10 MG tablet Generic drug: cetirizine Take 1 tablet by mouth once daily   Farxiga 10 MG Tabs tablet Generic drug: dapagliflozin propanediol Take 10 mg by mouth daily.   furosemide 40 MG tablet Commonly known as: LASIX Take 1 tablet by mouth twice daily   Lantus SoloStar 100 UNIT/ML Solostar Pen Generic drug: insulin glargine Inject 5 Units into the skin daily.   losartan 25 MG tablet Commonly known as: COZAAR Take 25 mg by mouth daily.   metFORMIN 1000 MG tablet Commonly known as: GLUCOPHAGE Take 1,000 mg by mouth daily with breakfast.   methimazole 5 MG tablet Commonly known as: TAPAZOLE Take 2.5 tablets by mouth daily.   metoprolol succinate 100 MG 24 hr tablet Commonly known as: TOPROL-XL metoprolol succinate ER 100 mg tablet,extended release 24 hr   metoprolol tartrate 25 MG tablet Commonly known as: LOPRESSOR Take 25 mg by mouth 2 (two) times daily.   midodrine 2.5 MG tablet Commonly known as: PROAMATINE Take 1 tablet (2.5 mg total) by mouth 3 (three) times daily.   mirabegron ER 50 MG Tb24 tablet Commonly known as: MYRBETRIQ Take 1 tablet (50 mg total) by mouth daily.   multivitamin with minerals Tabs tablet Take 1 tablet by mouth daily.   omeprazole 40 MG capsule Commonly known as: PRILOSEC Take 1 capsule by mouth once daily   OneTouch Ultra test strip Generic drug: glucose blood USE 1 STRIP TO CHECK GLUCOSE TWICE DAILY   OT ULTRA/FASTTK CNTRL SOLN Soln   Pacerone 200 MG tablet Generic drug: amiodarone Take 200 mg by mouth daily.   spironolactone 25 MG tablet Commonly known as: ALDACTONE Take 25 mg by mouth daily.   sucralfate 1 g tablet Commonly known as: CARAFATE Take 1 g by mouth 4 (four) times daily -  with meals and at bedtime.   tamsulosin 0.4 MG Caps capsule Commonly known as:  FLOMAX TAKE 2 CAPSULES BY MOUTH ONCE DAILY WITH LUNCH   Trulicity 1.5 IO/9.7DZ Sopn Generic drug: Dulaglutide Inject 1.5 mg into the skin once a week. Mondays   warfarin 5 MG tablet Commonly known as: COUMADIN Take by mouth.        Allergies:  Allergies  Allergen Reactions   Bupropion Other (See Comments)    Tremors, balance issues, cognitive impairment   Zyrtec Allergy [Cetirizine] Diarrhea    Family History: Family History  Problem Relation Age of Onset   Hypertension Mother    Heart disease Mother    Mental illness Neg Hx     Social History:  reports that he quit smoking about 34 years ago. His smoking use included cigarettes. He has never used smokeless tobacco. He reports that he does not currently use alcohol. He reports that he does not use drugs.   Physical  Exam: BP 110/72   Pulse 64   Ht '5\' 9"'$  (1.753 m)   Wt 215 lb (97.5 kg)   BMI 31.75 kg/m   Constitutional:  Alert and oriented, No acute distress. HEENT: [place] AT, moist mucus membranes.  Trachea midline, no masses. Cardiovascular: No clubbing, cyanosis, or edema. Respiratory: Normal respiratory effort, no increased work of breathing. GI: Abdomen is soft, nontender, nondistended, no abdominal masses Psychiatric: Normal mood and affect.   Assessment & Plan:    1. Benign prostatic hyperplasia with lower urinary tract symptoms, symptom details unspecified LUTS have improved on tamsulosin and Myrbetriq He was apparently not having the dampness when he was taking dutasteride.  He desired Rx of finasteride which may not help this symptom but will try for 6 months PVR today was 12 mL Urinalysis unremarkable  2.  Erectile dysfunction He desired a trial of tadalafil and 20 mg Rx sent to pharmacy Denies the use of oral or sublingual nitrates  1 year follow-up scheduled and instructed to call earlier for any significant change in his symptoms   Abbie Sons, MD  [facility] [address], [place], [place] [address] [phone]

## 2022-11-04 NOTE — Progress Notes (Addendum)
I,[name],acting as a scribe for [employer], MD.,have documented all relevant documentation on the behalf of [name], MD,as directed by  [name], MD while in the presence of [name], MD.   Established patient visit   Patient: Tom Moore   DOB: 22-Nov-1945   76 y.o. Male  MRN: [id number] Visit Date: 11/05/2022  Today's healthcare provider: [name], MD   No chief complaint on file.  Subjective    HPI HPI   moles Last edited by [name], MD on 11/05/2022 10:33 AM.       Skin Moles  Patient comes in with concerns of mole itching and several others that he has on his face and one on his chest. Would like a Dermatology referral.he is hoping to have appt in the next few days because he is traveling ot [place] for an unknown amount of time in 2 weeks. He denies personal history of skin cancer  Denies bleeding, drainage, pain associated with the lesions  He reports these skin lesions have been present his entire life and have not recently changed in size    HF  Patient reports that he is feeling well and has been adherent to his HF medication regimen including Lasix 40 mg twice daily, losartan 25 mg daily, metoprolol tartrate 25 mg twice daily, spironolactone 25 mg daily, atorvastatin 40 mg daily diltiazem 120 mg daily  For his atrial fibrillation he takes Pacerone 200 mg daily, warfarin 5 mg daily Need refills for Furosemide 40 mg  DM  Patient follows with endocrinology  States that he has upcoming appt to follow up on DM  Reports BG this am was 111 and 95 the day before  Reports that he used his last dose of lantus last night and is requesting refills    Health Update:  BPH Information obtained from EMR review from encounter on 11/04/22 He was evaluated by Dr. Bernardo Heater in urology for BPH and ED  He was prescribed tadalfil 20m and finasteride 567mHe has not  started the finasteride     Medications: Outpatient Medications Prior to Visit  Medication Sig   atorvastatin (LIPITOR) 40 MG tablet Take 40 mg by mouth daily.   Dulaglutide (TRULICITY) 1.5 MG0000000OPN Inject 1.5 mg into the skin once a week. Mondays   EQ ALLERGY RELIEF, CETIRIZINE, 10 MG tablet Take 1 tablet by mouth once daily   FARXIGA 10 MG TABS tablet Take 10 mg by mouth daily.   losartan (COZAAR) 25 MG tablet Take 25 mg by mouth daily.   metFORMIN (GLUCOPHAGE) 1000 MG tablet Take 1,000 mg by mouth daily with breakfast.   methimazole (TAPAZOLE) 5 MG tablet Take 2.5 tablets by mouth daily.   metoprolol tartrate (LOPRESSOR) 25 MG tablet Take 25 mg by mouth 2 (two) times daily.   midodrine (PROAMATINE) 2.5 MG tablet Take 1 tablet (2.5 mg total) by mouth 3 (three) times daily.   mirabegron ER (MYRBETRIQ) 50 MG TB24 tablet Take 1 tablet (50 mg total) by mouth daily.   omeprazole (PRILOSEC) 40 MG capsule Take 1 capsule by mouth once daily   spironolactone (ALDACTONE) 25 MG tablet Take 25 mg by mouth daily.   sucralfate (CARAFATE) 1 g tablet Take 1 g by mouth 4 (four) times daily -  with meals and at bedtime.   tamsulosin (FLOMAX) 0.4 MG CAPS capsule TAKE 2 CAPSULES BY MOUTH ONCE DAILY WITH LUNCH   warfarin (COUMADIN) 5 MG tablet Take by mouth.   [  DISCONTINUED] furosemide (LASIX) 40 MG tablet Take 1 tablet by mouth twice daily   [DISCONTINUED] insulin glargine (LANTUS SOLOSTAR) 100 UNIT/ML Solostar Pen Inject 5 Units into the skin daily. (Patient taking differently: Inject 5 Units into the skin daily. Doing 50 units)   ALPRAZolam (XANAX) 0.5 MG tablet Take 1 tablet (0.5 mg total) by mouth 3 (three) times daily as needed for anxiety.   Blood Glucose Calibration (OT ULTRA/FASTTK CNTRL SOLN) SOLN    busPIRone (BUSPAR) 7.5 MG tablet Take 1 tablet (7.5 mg total) by mouth 2 (two) times daily.   canagliflozin (INVOKANA) 300 MG TABS tablet Take 1 tablet (300 mg total) by mouth daily before  breakfast.   diltiazem (CARDIZEM CD) 120 MG 24 hr capsule Take 1 capsule (120 mg total) by mouth daily.   finasteride (PROSCAR) 5 MG tablet Take 1 tablet (5 mg total) by mouth daily.   metoprolol succinate (TOPROL-XL) 100 MG 24 hr tablet metoprolol succinate ER 100 mg tablet,extended release 24 hr   Multiple Vitamin (MULTIVITAMIN WITH MINERALS) TABS tablet Take 1 tablet by mouth daily.   ONETOUCH ULTRA test strip USE 1 STRIP TO CHECK GLUCOSE TWICE DAILY   PACERONE 200 MG tablet Take 200 mg by mouth daily.   tadalafil (CIALIS) 20 MG tablet Take 1 tablet 1 hour prior to intercourse.   No facility-administered medications prior to visit.    Review of Systems     Objective    BP (!) 89/56 (BP Location: Left Arm, Patient Position: Sitting, Cuff Size: Normal)   Pulse 65   Temp 97.8 F (36.6 C) (Oral)   Resp 16   Wt 218 lb (98.9 kg)   SpO2 99%   BMI 32.19 kg/m    Physical Exam Vitals reviewed.  Constitutional:      General: He is not in acute distress.    Appearance: Normal appearance. He is not ill-appearing, toxic-appearing or diaphoretic.  Eyes:     Conjunctiva/sclera: Conjunctivae normal.  Cardiovascular:     Rate and Rhythm: Normal rate and regular rhythm.     Pulses: Normal pulses.     Heart sounds: Normal heart sounds. No murmur heard.    No friction rub. No gallop.  Pulmonary:     Effort: Pulmonary effort is normal. No respiratory distress.     Breath sounds: Normal breath sounds. No stridor. No wheezing, rhonchi or rales.  Abdominal:     General: Bowel sounds are normal. There is no distension.     Palpations: Abdomen is soft.     Tenderness: There is no abdominal tenderness.  Musculoskeletal:     Right lower leg: No edema.     Left lower leg: No edema.  Skin:    Findings: No erythema or rash.  Neurological:     Mental Status: He is alert and oriented to person, place, and time.            No results found for any visits on 11/05/22.  Assessment &  Plan     Problem List Items Addressed This Visit       Cardiovascular and Mediastinum   Heart failure with reduced ejection fraction (HCC)    Chronic  Stable  Has appt with Dr. Clayborn Bigness, cardiology coming on on 11/10/22, recommended recording BP until that appt for low BP today (patient has prescription for midodrine three times daily) Provided refills for lasix 18m twice daily  He will continue other medications for heart failure  No medication changes today  Relevant Medications   furosemide (LASIX) 40 MG tablet   Idiopathic hypotension    Chronic  Currently low BP  Was normotensive at urology visit per chart review from 110/72 Continue midodrine 2.86m TID  Encouraged patient to follow up as scheduled with cardiology on 11/10/22       Relevant Medications   furosemide (LASIX) 40 MG tablet     Endocrine   Controlled type 2 diabetes mellitus with stage 3 chronic kidney disease ([place]    Chronic  Stable  Follows with endocrinology has upcoming appt on 11/15/22  Refills provided for lantus 50 units daily No medication changes today        Relevant Medications   insulin glargine (LANTUS SOLOSTAR) 100 UNIT/ML Solostar Pen     Musculoskeletal and Integument   SK (seborrheic keratosis) - Primary    Chronic Recently worsened Patient experiencing pruritus and increased dryness of skin lesions that appear to be seborrheic keratosis Discussed with patient benign nature of these lesions Given his family history of skin cancer and symptoms becoming more bothersome, we will submit referral to dermatology for further evaluation per patient's request Patient requesting urgent referral with hopes that he can have an appointment before he leaves in the next 2 [place]       Relevant Orders   Ambulatory referral to Dermatology     Return in about 4 months (around 03/06/2023) for blood pressue and mood .        The  entirety of the information documented in the History of Present Illness, Review of Systems and Physical Exam were personally obtained by me. Portions of this information were initially documented by JLyndel Pleasure CMA and reviewed by me for thoroughness and accuracy.MEulis Foster MD     MEulis Foster MD  [phone](phone) [phone](fax)  [place]

## 2022-11-05 ENCOUNTER — Ambulatory Visit (INDEPENDENT_AMBULATORY_CARE_PROVIDER_SITE_OTHER): Payer: Medicare Other | Admitting: Family Medicine

## 2022-11-05 ENCOUNTER — Ambulatory Visit: Payer: Medicare Other | Admitting: Physician Assistant

## 2022-11-05 ENCOUNTER — Encounter: Payer: Self-pay | Admitting: Family Medicine

## 2022-11-05 VITALS — BP 89/56 | HR 65 | Temp 97.8°F | Resp 16 | Wt 218.0 lb

## 2022-11-05 DIAGNOSIS — N183 Chronic kidney disease, stage 3 unspecified: Secondary | ICD-10-CM | POA: Diagnosis not present

## 2022-11-05 DIAGNOSIS — I950 Idiopathic hypotension: Secondary | ICD-10-CM

## 2022-11-05 DIAGNOSIS — I5020 Unspecified systolic (congestive) heart failure: Secondary | ICD-10-CM | POA: Diagnosis not present

## 2022-11-05 DIAGNOSIS — L821 Other seborrheic keratosis: Secondary | ICD-10-CM | POA: Diagnosis not present

## 2022-11-05 DIAGNOSIS — E1122 Type 2 diabetes mellitus with diabetic chronic kidney disease: Secondary | ICD-10-CM | POA: Diagnosis not present

## 2022-11-05 LAB — URINALYSIS, COMPLETE
Bilirubin, UA: NEGATIVE
Ketones, UA: NEGATIVE
Leukocytes,UA: NEGATIVE
Nitrite, UA: NEGATIVE
Protein,UA: NEGATIVE
RBC, UA: NEGATIVE
Specific Gravity, UA: 1.01 (ref 1.005–1.030)
Urobilinogen, Ur: 1 mg/dL (ref 0.2–1.0)
pH, UA: 5.5 (ref 5.0–7.5)

## 2022-11-05 LAB — MICROSCOPIC EXAMINATION

## 2022-11-05 MED ORDER — FUROSEMIDE 40 MG PO TABS: 40.0000 mg | ORAL_TABLET | Freq: Two times a day (BID) | ORAL | 1 refills | Status: AC

## 2022-11-05 MED ORDER — LANTUS SOLOSTAR 100 UNIT/ML ~~LOC~~ SOPN: 50.0000 [IU] | PEN_INJECTOR | Freq: Every day | SUBCUTANEOUS | 0 refills | Status: AC

## 2022-11-05 NOTE — Assessment & Plan Note (Signed)
Chronic  Stable  Follows with endocrinology has upcoming appt on 11/15/22  Refills provided for lantus 50 units daily No medication changes today

## 2022-11-05 NOTE — Patient Instructions (Addendum)
Your blood pressure was low today.   Please discuss with your cardiologist at your upcoming appointment on 11/10/22 and make sure your are drinking enough water to stay hydrated.   You will need to be seen urgently if you begin to experience lightheadedness or dizziness. I also recommend checking your blood pressure twice daily over the next few days so you can review with Dr.Callwood on the 14th.   I have refilled your lasix and lantus prescriptions as requested.   We have submitted the referral to dermatology for your skin changes. Please be on a lookout for a call from them to schedule an appointment.   Please follow up with me in 4 months for blood pressure and mood

## 2022-11-05 NOTE — Assessment & Plan Note (Signed)
Chronic Recently worsened Patient experiencing pruritus and increased dryness of skin lesions that appear to be seborrheic keratosis Discussed with patient benign nature of these lesions Given his family history of skin cancer and symptoms becoming more bothersome, we will submit referral to dermatology for further evaluation per patient's request Patient requesting urgent referral with hopes that he can have an appointment before he leaves in the next 2 weeks to travel to [place] for an unknown period of time

## 2022-11-05 NOTE — Assessment & Plan Note (Signed)
Chronic  Currently low BP  Was normotensive at urology visit per chart review from 110/72 Continue midodrine 2.19m TID  Encouraged patient to follow up as scheduled with cardiology on 11/10/22

## 2022-11-05 NOTE — Assessment & Plan Note (Addendum)
Chronic  Stable  Has appt with Dr. Clayborn Bigness, cardiology coming on on 11/10/22, recommended recording BP until that appt for low BP today (patient has prescription for midodrine three times daily) Provided refills for lasix 71m twice daily  He will continue other medications for heart failure  No medication changes today

## 2022-11-10 DIAGNOSIS — I429 Cardiomyopathy, unspecified: Secondary | ICD-10-CM | POA: Diagnosis not present

## 2022-11-10 DIAGNOSIS — E1122 Type 2 diabetes mellitus with diabetic chronic kidney disease: Secondary | ICD-10-CM | POA: Diagnosis not present

## 2022-11-10 DIAGNOSIS — I739 Peripheral vascular disease, unspecified: Secondary | ICD-10-CM | POA: Diagnosis not present

## 2022-11-10 DIAGNOSIS — N1832 Chronic kidney disease, stage 3b: Secondary | ICD-10-CM | POA: Diagnosis not present

## 2022-11-10 DIAGNOSIS — Z7689 Persons encountering health services in other specified circumstances: Secondary | ICD-10-CM | POA: Diagnosis not present

## 2022-11-10 DIAGNOSIS — N183 Chronic kidney disease, stage 3 unspecified: Secondary | ICD-10-CM | POA: Diagnosis not present

## 2022-11-10 DIAGNOSIS — L989 Disorder of the skin and subcutaneous tissue, unspecified: Secondary | ICD-10-CM | POA: Diagnosis not present

## 2022-11-10 DIAGNOSIS — I480 Paroxysmal atrial fibrillation: Secondary | ICD-10-CM | POA: Diagnosis not present

## 2022-11-10 DIAGNOSIS — I5022 Chronic systolic (congestive) heart failure: Secondary | ICD-10-CM | POA: Diagnosis not present

## 2022-11-10 DIAGNOSIS — I10 Essential (primary) hypertension: Secondary | ICD-10-CM | POA: Diagnosis not present

## 2022-11-10 DIAGNOSIS — K219 Gastro-esophageal reflux disease without esophagitis: Secondary | ICD-10-CM | POA: Diagnosis not present

## 2022-11-10 DIAGNOSIS — Z7901 Long term (current) use of anticoagulants: Secondary | ICD-10-CM | POA: Diagnosis not present

## 2022-11-15 DIAGNOSIS — E0590 Thyrotoxicosis, unspecified without thyrotoxic crisis or storm: Secondary | ICD-10-CM | POA: Diagnosis not present

## 2022-11-15 DIAGNOSIS — E1129 Type 2 diabetes mellitus with other diabetic kidney complication: Secondary | ICD-10-CM | POA: Diagnosis not present

## 2022-11-15 DIAGNOSIS — R809 Proteinuria, unspecified: Secondary | ICD-10-CM | POA: Diagnosis not present

## 2022-11-15 DIAGNOSIS — Z794 Long term (current) use of insulin: Secondary | ICD-10-CM | POA: Diagnosis not present

## 2022-11-15 DIAGNOSIS — E1122 Type 2 diabetes mellitus with diabetic chronic kidney disease: Secondary | ICD-10-CM | POA: Diagnosis not present

## 2022-11-15 DIAGNOSIS — N1831 Chronic kidney disease, stage 3a: Secondary | ICD-10-CM | POA: Diagnosis not present

## 2022-11-19 DIAGNOSIS — H43813 Vitreous degeneration, bilateral: Secondary | ICD-10-CM | POA: Diagnosis not present

## 2022-11-19 DIAGNOSIS — H04123 Dry eye syndrome of bilateral lacrimal glands: Secondary | ICD-10-CM | POA: Diagnosis not present

## 2022-11-19 DIAGNOSIS — Z961 Presence of intraocular lens: Secondary | ICD-10-CM | POA: Diagnosis not present

## 2022-11-19 DIAGNOSIS — E119 Type 2 diabetes mellitus without complications: Secondary | ICD-10-CM | POA: Diagnosis not present

## 2022-11-26 ENCOUNTER — Other Ambulatory Visit: Payer: Self-pay | Admitting: Family Medicine

## 2022-11-26 ENCOUNTER — Other Ambulatory Visit: Payer: Self-pay | Admitting: Urology

## 2022-11-26 NOTE — Telephone Encounter (Signed)
Pt stopped by and needs a 90 day supply on tadalafil (CIALIS) 20 MG tablet and tadalafil (CIALIS) 20 MG tablet. Pharmacy is [place]

## 2022-11-30 DIAGNOSIS — L298 Other pruritus: Secondary | ICD-10-CM | POA: Diagnosis not present

## 2022-11-30 DIAGNOSIS — L821 Other seborrheic keratosis: Secondary | ICD-10-CM | POA: Diagnosis not present

## 2022-11-30 DIAGNOSIS — D1801 Hemangioma of skin and subcutaneous tissue: Secondary | ICD-10-CM | POA: Diagnosis not present

## 2022-12-03 DIAGNOSIS — N401 Enlarged prostate with lower urinary tract symptoms: Secondary | ICD-10-CM | POA: Diagnosis not present

## 2022-12-03 DIAGNOSIS — I5022 Chronic systolic (congestive) heart failure: Secondary | ICD-10-CM | POA: Diagnosis not present

## 2022-12-03 DIAGNOSIS — I480 Paroxysmal atrial fibrillation: Secondary | ICD-10-CM | POA: Diagnosis not present

## 2022-12-03 DIAGNOSIS — E1129 Type 2 diabetes mellitus with other diabetic kidney complication: Secondary | ICD-10-CM | POA: Diagnosis not present

## 2022-12-03 DIAGNOSIS — R809 Proteinuria, unspecified: Secondary | ICD-10-CM | POA: Diagnosis not present

## 2022-12-03 DIAGNOSIS — E782 Mixed hyperlipidemia: Secondary | ICD-10-CM | POA: Diagnosis not present

## 2022-12-03 DIAGNOSIS — F331 Major depressive disorder, recurrent, moderate: Secondary | ICD-10-CM | POA: Diagnosis not present

## 2022-12-03 DIAGNOSIS — K219 Gastro-esophageal reflux disease without esophagitis: Secondary | ICD-10-CM | POA: Diagnosis not present

## 2022-12-03 DIAGNOSIS — I10 Essential (primary) hypertension: Secondary | ICD-10-CM | POA: Diagnosis not present

## 2022-12-03 DIAGNOSIS — Z794 Long term (current) use of insulin: Secondary | ICD-10-CM | POA: Diagnosis not present

## 2022-12-03 DIAGNOSIS — N1832 Chronic kidney disease, stage 3b: Secondary | ICD-10-CM | POA: Diagnosis not present

## 2022-12-06 DIAGNOSIS — M79674 Pain in right toe(s): Secondary | ICD-10-CM | POA: Diagnosis not present

## 2022-12-06 DIAGNOSIS — M79675 Pain in left toe(s): Secondary | ICD-10-CM | POA: Diagnosis not present

## 2022-12-06 DIAGNOSIS — B351 Tinea unguium: Secondary | ICD-10-CM | POA: Diagnosis not present

## 2023-03-04 ENCOUNTER — Other Ambulatory Visit: Payer: Self-pay | Admitting: Family Medicine

## 2023-03-04 ENCOUNTER — Other Ambulatory Visit: Payer: Self-pay | Admitting: Urology

## 2023-03-04 ENCOUNTER — Telehealth: Payer: Self-pay

## 2023-03-04 DIAGNOSIS — I5022 Chronic systolic (congestive) heart failure: Secondary | ICD-10-CM

## 2023-03-04 DIAGNOSIS — N183 Chronic kidney disease, stage 3 unspecified: Secondary | ICD-10-CM

## 2023-03-04 NOTE — Telephone Encounter (Signed)
Requested Prescriptions  Pending Prescriptions Disp Refills   omeprazole (PRILOSEC) 40 MG capsule [Pharmacy Med Name: Omeprazole 40 MG Oral Capsule Delayed Release] 90 capsule 2    Sig: Take 1 capsule by mouth once daily     Gastroenterology: Proton Pump Inhibitors Passed - 03/04/2023 10:43 AM      Passed - Valid encounter within last 12 months    Recent Outpatient Visits           3 months ago SK (seborrheic keratosis)   [place] [facility] Simmons-Robinson, [place], MD   7 months ago Current mild episode of major depressive disorder, unspecified whether recurrent (HCC)   [place] [facility] Simmons-Robinson, [place], MD   8 months ago Moderate episode of recurrent major depressive disorder (HCC)   [facility] Simmons-Robinson, Kief, MD   1 year ago Congestive heart failure, unspecified HF chronicity, unspecified heart failure type [facility])    [facility] Bosie Clos, MD   1 year ago Shortness of breath    [facility] Bosie Clos, MD       Future Appointments             In 3 days Simmons-Robinson, Tawanna Cooler, MD [facility]   In 8 months Stoioff, Verna Czech, MD [facility] [place]

## 2023-03-04 NOTE — Progress Notes (Deleted)
Established patient visit   Patient: Tom Moore   DOB: 10-31-1945   77 y.o. Male  MRN: [id number] Visit Date: 03/07/2023  Today's healthcare provider: Ronnald Ramp, MD   No chief complaint on file.  Subjective    HPI   Hypertension, follow-up  BP Readings from Last 3 Encounters:  11/05/22 (!) 89/56  11/04/22 110/72  07/16/22 (!) 101/58   Wt Readings from Last 3 Encounters:  11/05/22 218 lb (98.9 kg)  11/04/22 215 lb (97.5 kg)  07/16/22 212 lb 6.4 oz (96.3 kg)     He was last seen for hypertension 3 months ago.  BP at that visit was ***. Management since that visit includes ***.  He reports {excellent/good/fair/poor:19665} compliance with treatment. He {is/is not:9024} having side effects. {document side effects if present:1} He is following a {diet:21022986} diet. He {is/is not:9024} exercising. He {does/does not:200015} smoke.  Use of agents associated with hypertension: {bp agents assoc with hypertension:511::"none"}.   Outside blood pressures are {***enter patient reported home BP readings, or 'not being checked':1}. Symptoms: {Yes/No:20286} chest pain {Yes/No:20286} chest pressure  {Yes/No:20286} palpitations {Yes/No:20286} syncope  {Yes/No:20286} dyspnea {Yes/No:20286} orthopnea  {Yes/No:20286} paroxysmal nocturnal dyspnea {Yes/No:20286} lower extremity edema   Pertinent labs Lab Results  Component Value Date   CHOL 139 07/22/2020   HDL 45 07/22/2020   LDLCALC 64 07/22/2020   TRIG 180 (H) 07/22/2020   CHOLHDL 3.1 07/22/2020   Lab Results  Component Value Date   NA 143 03/01/2022   K 4.1 03/01/2022   CREATININE 1.7 (A) 03/01/2022   EGFR 39 03/01/2022   GLUCOSE 448 (H) 02/18/2022   TSH 0.43 03/01/2022     The 10-year ASCVD risk score (Arnett DK, et al., 2019) is: 38.4%  ---------------------------------------------------------------------------------------------------  Depression and Anxiety, Follow-up  He  was last seen  for this {NUMBERS 1-12:18279} {days/wks/mos/yrs:310907} ago. Changes made at last visit include Xanax 0.5mg  TID PRN and buspar 7.5mg  twice daily.   He reports {excellent/good/fair/poor:19665} compliance with treatment. He {is/is not:21021397} having side effects. ***  He reports {DESC; GOOD/FAIR/POOR:18685} tolerance of treatment. Current symptoms include: {Symptoms; depression:1002} He feels he is {improved/worse/unchanged:3041574} since last visit.     11/05/2022   10:29 AM 07/16/2022   10:18 AM 07/06/2022   11:25 AM  Depression screen PHQ 2/9  Decreased Interest 0 1 3  Down, Depressed, Hopeless 2 1 3   PHQ - 2 Score 2 2 6   Altered sleeping 2 2 2   Tired, decreased energy 1 1 3   Change in appetite 1 2 3   Feeling bad or failure about yourself  0 1 3  Trouble concentrating 1 1 3   Moving slowly or fidgety/restless 0 1 1  Suicidal thoughts 0 1 1  PHQ-9 Score 7 11 22   Difficult doing work/chores Not difficult at all Somewhat difficult     -----------------------------------------------------------------------------------------   Medications: Outpatient Medications Prior to Visit  Medication Sig   ALPRAZolam (XANAX) 0.5 MG tablet Take 1 tablet (0.5 mg total) by mouth 3 (three) times daily as needed for anxiety.   atorvastatin (LIPITOR) 40 MG tablet Take 40 mg by mouth daily.   Blood Glucose Calibration (OT ULTRA/FASTTK CNTRL SOLN) SOLN    busPIRone (BUSPAR) 7.5 MG tablet Take 1 tablet (7.5 mg total) by mouth 2 (two) times daily.   canagliflozin (INVOKANA) 300 MG TABS tablet Take 1 tablet (300 mg total) by mouth daily before breakfast.   diltiazem (CARDIZEM CD) 120 MG 24 hr capsule Take 1 capsule (120  mg total) by mouth daily.   Dulaglutide (TRULICITY) 1.5 MG/0.5ML SOPN Inject 1.5 mg into the skin once a week. Mondays   EQ ALLERGY RELIEF, CETIRIZINE, 10 MG tablet Take 1 tablet by mouth once daily   FARXIGA 10 MG TABS tablet Take 10 mg by mouth daily.   finasteride (PROSCAR) 5 MG  tablet Take 1 tablet (5 mg total) by mouth daily.   furosemide (LASIX) 40 MG tablet Take 1 tablet (40 mg total) by mouth 2 (two) times daily.   insulin glargine (LANTUS SOLOSTAR) 100 UNIT/ML Solostar Pen Inject 50 Units into the skin daily.   losartan (COZAAR) 25 MG tablet Take 25 mg by mouth daily.   metFORMIN (GLUCOPHAGE) 1000 MG tablet Take 1,000 mg by mouth daily with breakfast.   methimazole (TAPAZOLE) 5 MG tablet Take 2.5 tablets by mouth daily.   metoprolol succinate (TOPROL-XL) 100 MG 24 hr tablet metoprolol succinate ER 100 mg tablet,extended release 24 hr   metoprolol tartrate (LOPRESSOR) 25 MG tablet Take 25 mg by mouth 2 (two) times daily.   midodrine (PROAMATINE) 2.5 MG tablet Take 1 tablet (2.5 mg total) by mouth 3 (three) times daily.   mirabegron ER (MYRBETRIQ) 50 MG TB24 tablet Take 1 tablet (50 mg total) by mouth daily.   Multiple Vitamin (MULTIVITAMIN WITH MINERALS) TABS tablet Take 1 tablet by mouth daily.   omeprazole (PRILOSEC) 40 MG capsule Take 1 capsule by mouth once daily   ONETOUCH ULTRA test strip USE 1 STRIP TO CHECK GLUCOSE TWICE DAILY   PACERONE 200 MG tablet Take 200 mg by mouth daily.   spironolactone (ALDACTONE) 25 MG tablet Take 25 mg by mouth daily.   sucralfate (CARAFATE) 1 g tablet Take 1 g by mouth 4 (four) times daily -  with meals and at bedtime.   tadalafil (CIALIS) 20 MG tablet TAKE 1 TABLET BY MOUTH ONE HOUR PRIOR TO INTERCOURSE   tamsulosin (FLOMAX) 0.4 MG CAPS capsule TAKE 2 CAPSULES BY MOUTH ONCE DAILY WITH LUNCH   warfarin (COUMADIN) 5 MG tablet Take by mouth.   No facility-administered medications prior to visit.    Review of Systems  {Labs  Heme  Chem  Endocrine  Serology  Results Review (optional):23779}   Objective    There were no vitals taken for this visit. {Show previous vital signs (optional):23777}  Physical Exam  ***  No results found for any visits on 03/07/23.  Assessment & Plan     ***  No follow-ups on file.       {provider attestation***:1}   Ronnald Ramp, MD  [facility] [phone] (phone) [phone] (fax)  [facility]

## 2023-03-04 NOTE — Telephone Encounter (Signed)
PharmD reviewed patient chart to assess eligibility for [facility] services. Patient was determined to be a good candidate for the program given the complexity of the medication regimen and/or overall risk for hospitalization and increased utilization.  Referral entered in order to outreach patient and offer appointment with PharmD. Referral cosigned to PCP.  

## 2023-03-07 ENCOUNTER — Ambulatory Visit: Payer: Medicare Other | Admitting: Family Medicine

## 2023-03-07 DIAGNOSIS — E1122 Type 2 diabetes mellitus with diabetic chronic kidney disease: Secondary | ICD-10-CM

## 2023-03-07 DIAGNOSIS — E0590 Thyrotoxicosis, unspecified without thyrotoxic crisis or storm: Secondary | ICD-10-CM

## 2023-03-07 DIAGNOSIS — E1129 Type 2 diabetes mellitus with other diabetic kidney complication: Secondary | ICD-10-CM | POA: Diagnosis not present

## 2023-03-07 DIAGNOSIS — I10 Essential (primary) hypertension: Secondary | ICD-10-CM | POA: Diagnosis not present

## 2023-03-07 DIAGNOSIS — N401 Enlarged prostate with lower urinary tract symptoms: Secondary | ICD-10-CM | POA: Diagnosis not present

## 2023-03-07 DIAGNOSIS — F331 Major depressive disorder, recurrent, moderate: Secondary | ICD-10-CM

## 2023-03-07 DIAGNOSIS — Z794 Long term (current) use of insulin: Secondary | ICD-10-CM | POA: Diagnosis not present

## 2023-03-07 DIAGNOSIS — E782 Mixed hyperlipidemia: Secondary | ICD-10-CM | POA: Diagnosis not present

## 2023-03-07 DIAGNOSIS — J069 Acute upper respiratory infection, unspecified: Secondary | ICD-10-CM | POA: Diagnosis not present

## 2023-03-07 DIAGNOSIS — I5022 Chronic systolic (congestive) heart failure: Secondary | ICD-10-CM | POA: Diagnosis not present

## 2023-03-07 DIAGNOSIS — K219 Gastro-esophageal reflux disease without esophagitis: Secondary | ICD-10-CM | POA: Diagnosis not present

## 2023-03-07 DIAGNOSIS — F32A Depression, unspecified: Secondary | ICD-10-CM | POA: Diagnosis not present

## 2023-03-07 DIAGNOSIS — F320 Major depressive disorder, single episode, mild: Secondary | ICD-10-CM

## 2023-03-07 DIAGNOSIS — I480 Paroxysmal atrial fibrillation: Secondary | ICD-10-CM | POA: Diagnosis not present

## 2023-03-07 DIAGNOSIS — I5020 Unspecified systolic (congestive) heart failure: Secondary | ICD-10-CM

## 2023-03-07 DIAGNOSIS — N1832 Chronic kidney disease, stage 3b: Secondary | ICD-10-CM | POA: Diagnosis not present

## 2023-03-07 DIAGNOSIS — R809 Proteinuria, unspecified: Secondary | ICD-10-CM | POA: Diagnosis not present

## 2023-03-07 DIAGNOSIS — Z0000 Encounter for general adult medical examination without abnormal findings: Secondary | ICD-10-CM | POA: Diagnosis not present

## 2023-03-08 ENCOUNTER — Ambulatory Visit: Payer: Self-pay | Admitting: *Deleted

## 2023-03-08 DIAGNOSIS — I480 Paroxysmal atrial fibrillation: Secondary | ICD-10-CM | POA: Diagnosis not present

## 2023-03-08 DIAGNOSIS — G4733 Obstructive sleep apnea (adult) (pediatric): Secondary | ICD-10-CM | POA: Diagnosis not present

## 2023-03-08 DIAGNOSIS — K219 Gastro-esophageal reflux disease without esophagitis: Secondary | ICD-10-CM | POA: Diagnosis not present

## 2023-03-08 DIAGNOSIS — N1832 Chronic kidney disease, stage 3b: Secondary | ICD-10-CM | POA: Diagnosis not present

## 2023-03-08 DIAGNOSIS — Z794 Long term (current) use of insulin: Secondary | ICD-10-CM | POA: Diagnosis not present

## 2023-03-08 DIAGNOSIS — E1122 Type 2 diabetes mellitus with diabetic chronic kidney disease: Secondary | ICD-10-CM | POA: Diagnosis not present

## 2023-03-08 DIAGNOSIS — I429 Cardiomyopathy, unspecified: Secondary | ICD-10-CM | POA: Diagnosis not present

## 2023-03-08 DIAGNOSIS — E1129 Type 2 diabetes mellitus with other diabetic kidney complication: Secondary | ICD-10-CM | POA: Diagnosis not present

## 2023-03-08 DIAGNOSIS — R001 Bradycardia, unspecified: Secondary | ICD-10-CM | POA: Diagnosis not present

## 2023-03-08 DIAGNOSIS — N1831 Chronic kidney disease, stage 3a: Secondary | ICD-10-CM | POA: Diagnosis not present

## 2023-03-08 DIAGNOSIS — I10 Essential (primary) hypertension: Secondary | ICD-10-CM | POA: Diagnosis not present

## 2023-03-08 DIAGNOSIS — I5022 Chronic systolic (congestive) heart failure: Secondary | ICD-10-CM | POA: Diagnosis not present

## 2023-03-08 DIAGNOSIS — Z7901 Long term (current) use of anticoagulants: Secondary | ICD-10-CM | POA: Diagnosis not present

## 2023-03-08 DIAGNOSIS — Z03818 Encounter for observation for suspected exposure to other biological agents ruled out: Secondary | ICD-10-CM | POA: Diagnosis not present

## 2023-03-08 DIAGNOSIS — J069 Acute upper respiratory infection, unspecified: Secondary | ICD-10-CM | POA: Diagnosis not present

## 2023-03-08 DIAGNOSIS — R55 Syncope and collapse: Secondary | ICD-10-CM | POA: Diagnosis not present

## 2023-03-08 DIAGNOSIS — I739 Peripheral vascular disease, unspecified: Secondary | ICD-10-CM | POA: Diagnosis not present

## 2023-03-08 DIAGNOSIS — E0590 Thyrotoxicosis, unspecified without thyrotoxic crisis or storm: Secondary | ICD-10-CM | POA: Diagnosis not present

## 2023-03-08 DIAGNOSIS — R809 Proteinuria, unspecified: Secondary | ICD-10-CM | POA: Diagnosis not present

## 2023-03-08 NOTE — Telephone Encounter (Signed)
Unable to leave voicemail. CRM Created. Ok for [facility] to verify the nature of call

## 2023-03-08 NOTE — Telephone Encounter (Signed)
Attempted to return his call to discuss his symptoms.   Got a message that "This voicemail has not been set up yet".    "Good bye"  so unable to leave a message.  Per policy after 3rd attempt to return the call I am forwarding this to [facility].

## 2023-03-08 NOTE — Telephone Encounter (Signed)
Summary: Questions about RX   Questions regarding the  medications atorvastatin (LIPITOR) 40 MG tablet [[id number]] and losartan (COZAAR) 25 MG tablet [[id number]]      Called patient [phone] to review medication questions. Recording states VM box has not been set up . Unable to leave message to call back.

## 2023-03-08 NOTE — Telephone Encounter (Signed)
2nd attempt to contact patient on [phone] to review medication questions. . No answer recording states call can not be completed at this time. Try call again later. No message left.

## 2023-05-16 ENCOUNTER — Telehealth: Payer: Self-pay | Admitting: Family Medicine

## 2023-05-16 NOTE — Telephone Encounter (Signed)
Patient is now being seen at [facility]. See provider notes in Epic.

## 2023-05-18 DIAGNOSIS — E782 Mixed hyperlipidemia: Secondary | ICD-10-CM | POA: Diagnosis not present

## 2023-05-18 DIAGNOSIS — N1832 Chronic kidney disease, stage 3b: Secondary | ICD-10-CM | POA: Diagnosis not present

## 2023-05-18 DIAGNOSIS — E1129 Type 2 diabetes mellitus with other diabetic kidney complication: Secondary | ICD-10-CM | POA: Diagnosis not present

## 2023-05-18 DIAGNOSIS — K219 Gastro-esophageal reflux disease without esophagitis: Secondary | ICD-10-CM | POA: Diagnosis not present

## 2023-05-18 DIAGNOSIS — R42 Dizziness and giddiness: Secondary | ICD-10-CM | POA: Diagnosis not present

## 2023-05-18 DIAGNOSIS — R1084 Generalized abdominal pain: Secondary | ICD-10-CM | POA: Diagnosis not present

## 2023-05-18 DIAGNOSIS — I480 Paroxysmal atrial fibrillation: Secondary | ICD-10-CM | POA: Diagnosis not present

## 2023-05-18 DIAGNOSIS — I5022 Chronic systolic (congestive) heart failure: Secondary | ICD-10-CM | POA: Diagnosis not present

## 2023-05-18 DIAGNOSIS — F331 Major depressive disorder, recurrent, moderate: Secondary | ICD-10-CM | POA: Diagnosis not present

## 2023-05-18 DIAGNOSIS — R296 Repeated falls: Secondary | ICD-10-CM | POA: Diagnosis not present

## 2023-05-18 DIAGNOSIS — N401 Enlarged prostate with lower urinary tract symptoms: Secondary | ICD-10-CM | POA: Diagnosis not present

## 2023-05-18 DIAGNOSIS — R809 Proteinuria, unspecified: Secondary | ICD-10-CM | POA: Diagnosis not present

## 2023-05-18 DIAGNOSIS — I10 Essential (primary) hypertension: Secondary | ICD-10-CM | POA: Diagnosis not present

## 2023-05-20 ENCOUNTER — Other Ambulatory Visit: Payer: Self-pay | Admitting: Infectious Diseases

## 2023-05-20 DIAGNOSIS — R296 Repeated falls: Secondary | ICD-10-CM

## 2023-05-20 DIAGNOSIS — I10 Essential (primary) hypertension: Secondary | ICD-10-CM

## 2023-05-20 DIAGNOSIS — R42 Dizziness and giddiness: Secondary | ICD-10-CM

## 2023-05-23 DIAGNOSIS — R296 Repeated falls: Secondary | ICD-10-CM | POA: Diagnosis not present

## 2023-05-23 DIAGNOSIS — R2689 Other abnormalities of gait and mobility: Secondary | ICD-10-CM | POA: Diagnosis not present

## 2023-05-23 DIAGNOSIS — R944 Abnormal results of kidney function studies: Secondary | ICD-10-CM | POA: Diagnosis not present

## 2023-05-24 DIAGNOSIS — G4733 Obstructive sleep apnea (adult) (pediatric): Secondary | ICD-10-CM | POA: Diagnosis not present

## 2023-05-24 DIAGNOSIS — I10 Essential (primary) hypertension: Secondary | ICD-10-CM | POA: Diagnosis not present

## 2023-05-24 DIAGNOSIS — I5022 Chronic systolic (congestive) heart failure: Secondary | ICD-10-CM | POA: Diagnosis not present

## 2023-05-24 DIAGNOSIS — E1122 Type 2 diabetes mellitus with diabetic chronic kidney disease: Secondary | ICD-10-CM | POA: Diagnosis not present

## 2023-05-24 DIAGNOSIS — Z7901 Long term (current) use of anticoagulants: Secondary | ICD-10-CM | POA: Diagnosis not present

## 2023-05-24 DIAGNOSIS — K219 Gastro-esophageal reflux disease without esophagitis: Secondary | ICD-10-CM | POA: Diagnosis not present

## 2023-05-24 DIAGNOSIS — I739 Peripheral vascular disease, unspecified: Secondary | ICD-10-CM | POA: Diagnosis not present

## 2023-05-24 DIAGNOSIS — I480 Paroxysmal atrial fibrillation: Secondary | ICD-10-CM | POA: Diagnosis not present

## 2023-05-24 DIAGNOSIS — N1832 Chronic kidney disease, stage 3b: Secondary | ICD-10-CM | POA: Diagnosis not present

## 2023-05-24 DIAGNOSIS — R001 Bradycardia, unspecified: Secondary | ICD-10-CM | POA: Diagnosis not present

## 2023-05-24 DIAGNOSIS — I429 Cardiomyopathy, unspecified: Secondary | ICD-10-CM | POA: Diagnosis not present

## 2023-05-24 DIAGNOSIS — R791 Abnormal coagulation profile: Secondary | ICD-10-CM | POA: Diagnosis not present

## 2023-05-26 ENCOUNTER — Ambulatory Visit
Admission: RE | Admit: 2023-05-26 | Discharge: 2023-05-26 | Disposition: A | Discharge status: Home / Self Care | Payer: No Typology Code available for payment source | Source: Ambulatory Visit | Attending: Infectious Diseases | Admitting: Infectious Diseases

## 2023-05-26 DIAGNOSIS — I5022 Chronic systolic (congestive) heart failure: Secondary | ICD-10-CM | POA: Diagnosis not present

## 2023-05-26 DIAGNOSIS — I10 Essential (primary) hypertension: Secondary | ICD-10-CM | POA: Insufficient documentation

## 2023-05-26 DIAGNOSIS — R42 Dizziness and giddiness: Secondary | ICD-10-CM | POA: Insufficient documentation

## 2023-05-26 DIAGNOSIS — R296 Repeated falls: Secondary | ICD-10-CM | POA: Insufficient documentation

## 2023-05-27 DIAGNOSIS — N1832 Chronic kidney disease, stage 3b: Secondary | ICD-10-CM | POA: Diagnosis not present

## 2023-05-27 DIAGNOSIS — K30 Functional dyspepsia: Secondary | ICD-10-CM | POA: Diagnosis not present

## 2023-05-28 ENCOUNTER — Other Ambulatory Visit: Payer: Self-pay | Admitting: Urology

## 2023-06-02 DIAGNOSIS — R809 Proteinuria, unspecified: Secondary | ICD-10-CM | POA: Diagnosis not present

## 2023-06-02 DIAGNOSIS — E782 Mixed hyperlipidemia: Secondary | ICD-10-CM | POA: Diagnosis not present

## 2023-06-02 DIAGNOSIS — I5022 Chronic systolic (congestive) heart failure: Secondary | ICD-10-CM | POA: Diagnosis not present

## 2023-06-02 DIAGNOSIS — N401 Enlarged prostate with lower urinary tract symptoms: Secondary | ICD-10-CM | POA: Diagnosis not present

## 2023-06-02 DIAGNOSIS — Z794 Long term (current) use of insulin: Secondary | ICD-10-CM | POA: Diagnosis not present

## 2023-06-02 DIAGNOSIS — I480 Paroxysmal atrial fibrillation: Secondary | ICD-10-CM | POA: Diagnosis not present

## 2023-06-02 DIAGNOSIS — E1122 Type 2 diabetes mellitus with diabetic chronic kidney disease: Secondary | ICD-10-CM | POA: Diagnosis not present

## 2023-06-02 DIAGNOSIS — F331 Major depressive disorder, recurrent, moderate: Secondary | ICD-10-CM | POA: Diagnosis not present

## 2023-06-02 DIAGNOSIS — E1129 Type 2 diabetes mellitus with other diabetic kidney complication: Secondary | ICD-10-CM | POA: Diagnosis not present

## 2023-06-02 DIAGNOSIS — R1084 Generalized abdominal pain: Secondary | ICD-10-CM | POA: Diagnosis not present

## 2023-06-02 DIAGNOSIS — K219 Gastro-esophageal reflux disease without esophagitis: Secondary | ICD-10-CM | POA: Diagnosis not present

## 2023-06-02 DIAGNOSIS — E162 Hypoglycemia, unspecified: Secondary | ICD-10-CM | POA: Diagnosis not present

## 2023-06-02 DIAGNOSIS — E0590 Thyrotoxicosis, unspecified without thyrotoxic crisis or storm: Secondary | ICD-10-CM | POA: Diagnosis not present

## 2023-06-02 DIAGNOSIS — N1832 Chronic kidney disease, stage 3b: Secondary | ICD-10-CM | POA: Diagnosis not present

## 2023-06-02 DIAGNOSIS — I10 Essential (primary) hypertension: Secondary | ICD-10-CM | POA: Diagnosis not present

## 2023-06-03 DIAGNOSIS — M79675 Pain in left toe(s): Secondary | ICD-10-CM | POA: Diagnosis not present

## 2023-06-03 DIAGNOSIS — B351 Tinea unguium: Secondary | ICD-10-CM | POA: Diagnosis not present

## 2023-06-03 DIAGNOSIS — M79674 Pain in right toe(s): Secondary | ICD-10-CM | POA: Diagnosis not present

## 2023-06-06 DIAGNOSIS — I5022 Chronic systolic (congestive) heart failure: Secondary | ICD-10-CM | POA: Diagnosis not present

## 2023-06-06 DIAGNOSIS — M79672 Pain in left foot: Secondary | ICD-10-CM | POA: Diagnosis not present

## 2023-06-06 DIAGNOSIS — I10 Essential (primary) hypertension: Secondary | ICD-10-CM | POA: Diagnosis not present

## 2023-06-06 DIAGNOSIS — M79671 Pain in right foot: Secondary | ICD-10-CM | POA: Diagnosis not present

## 2023-06-06 DIAGNOSIS — I480 Paroxysmal atrial fibrillation: Secondary | ICD-10-CM | POA: Diagnosis not present

## 2023-06-27 DIAGNOSIS — E1129 Type 2 diabetes mellitus with other diabetic kidney complication: Secondary | ICD-10-CM | POA: Diagnosis not present

## 2023-06-27 DIAGNOSIS — R809 Proteinuria, unspecified: Secondary | ICD-10-CM | POA: Diagnosis not present

## 2023-06-27 DIAGNOSIS — Z794 Long term (current) use of insulin: Secondary | ICD-10-CM | POA: Diagnosis not present

## 2023-06-28 DIAGNOSIS — R944 Abnormal results of kidney function studies: Secondary | ICD-10-CM | POA: Diagnosis not present

## 2023-06-28 DIAGNOSIS — R634 Abnormal weight loss: Secondary | ICD-10-CM | POA: Diagnosis not present

## 2023-06-28 DIAGNOSIS — G5793 Unspecified mononeuropathy of bilateral lower limbs: Secondary | ICD-10-CM | POA: Diagnosis not present

## 2023-06-28 DIAGNOSIS — R2689 Other abnormalities of gait and mobility: Secondary | ICD-10-CM | POA: Diagnosis not present

## 2023-06-29 DIAGNOSIS — E1129 Type 2 diabetes mellitus with other diabetic kidney complication: Secondary | ICD-10-CM | POA: Diagnosis not present

## 2023-06-29 DIAGNOSIS — K219 Gastro-esophageal reflux disease without esophagitis: Secondary | ICD-10-CM | POA: Diagnosis not present

## 2023-06-29 DIAGNOSIS — F331 Major depressive disorder, recurrent, moderate: Secondary | ICD-10-CM | POA: Diagnosis not present

## 2023-06-29 DIAGNOSIS — I480 Paroxysmal atrial fibrillation: Secondary | ICD-10-CM | POA: Diagnosis not present

## 2023-06-29 DIAGNOSIS — M79671 Pain in right foot: Secondary | ICD-10-CM | POA: Diagnosis not present

## 2023-06-29 DIAGNOSIS — E782 Mixed hyperlipidemia: Secondary | ICD-10-CM | POA: Diagnosis not present

## 2023-06-29 DIAGNOSIS — I10 Essential (primary) hypertension: Secondary | ICD-10-CM | POA: Diagnosis not present

## 2023-06-29 DIAGNOSIS — N1832 Chronic kidney disease, stage 3b: Secondary | ICD-10-CM | POA: Diagnosis not present

## 2023-06-29 DIAGNOSIS — I5022 Chronic systolic (congestive) heart failure: Secondary | ICD-10-CM | POA: Diagnosis not present

## 2023-06-29 DIAGNOSIS — N401 Enlarged prostate with lower urinary tract symptoms: Secondary | ICD-10-CM | POA: Diagnosis not present

## 2023-06-29 DIAGNOSIS — R809 Proteinuria, unspecified: Secondary | ICD-10-CM | POA: Diagnosis not present

## 2023-06-29 DIAGNOSIS — M79672 Pain in left foot: Secondary | ICD-10-CM | POA: Diagnosis not present

## 2023-07-06 DIAGNOSIS — I10 Essential (primary) hypertension: Secondary | ICD-10-CM | POA: Diagnosis not present

## 2023-07-06 DIAGNOSIS — E1122 Type 2 diabetes mellitus with diabetic chronic kidney disease: Secondary | ICD-10-CM | POA: Diagnosis not present

## 2023-07-06 DIAGNOSIS — I739 Peripheral vascular disease, unspecified: Secondary | ICD-10-CM | POA: Diagnosis not present

## 2023-07-06 DIAGNOSIS — Z7901 Long term (current) use of anticoagulants: Secondary | ICD-10-CM | POA: Diagnosis not present

## 2023-07-06 DIAGNOSIS — R42 Dizziness and giddiness: Secondary | ICD-10-CM | POA: Diagnosis not present

## 2023-07-06 DIAGNOSIS — I429 Cardiomyopathy, unspecified: Secondary | ICD-10-CM | POA: Diagnosis not present

## 2023-07-06 DIAGNOSIS — K219 Gastro-esophageal reflux disease without esophagitis: Secondary | ICD-10-CM | POA: Diagnosis not present

## 2023-07-06 DIAGNOSIS — G4733 Obstructive sleep apnea (adult) (pediatric): Secondary | ICD-10-CM | POA: Diagnosis not present

## 2023-07-06 DIAGNOSIS — R001 Bradycardia, unspecified: Secondary | ICD-10-CM | POA: Diagnosis not present

## 2023-07-06 DIAGNOSIS — I480 Paroxysmal atrial fibrillation: Secondary | ICD-10-CM | POA: Diagnosis not present

## 2023-07-06 DIAGNOSIS — I5022 Chronic systolic (congestive) heart failure: Secondary | ICD-10-CM | POA: Diagnosis not present

## 2023-07-06 DIAGNOSIS — N1832 Chronic kidney disease, stage 3b: Secondary | ICD-10-CM | POA: Diagnosis not present

## 2023-07-07 ENCOUNTER — Other Ambulatory Visit: Payer: Self-pay | Admitting: Nephrology

## 2023-07-07 DIAGNOSIS — N1832 Chronic kidney disease, stage 3b: Secondary | ICD-10-CM

## 2023-07-07 DIAGNOSIS — I10 Essential (primary) hypertension: Secondary | ICD-10-CM | POA: Diagnosis not present

## 2023-07-07 DIAGNOSIS — E1122 Type 2 diabetes mellitus with diabetic chronic kidney disease: Secondary | ICD-10-CM | POA: Diagnosis not present

## 2023-07-08 DIAGNOSIS — M79672 Pain in left foot: Secondary | ICD-10-CM | POA: Diagnosis not present

## 2023-07-08 DIAGNOSIS — M79671 Pain in right foot: Secondary | ICD-10-CM | POA: Diagnosis not present

## 2023-07-13 ENCOUNTER — Ambulatory Visit
Admission: RE | Admit: 2023-07-13 | Discharge: 2023-07-13 | Disposition: A | Discharge status: Home / Self Care | Payer: No Typology Code available for payment source | Source: Ambulatory Visit | Attending: Nephrology | Admitting: Nephrology

## 2023-07-13 DIAGNOSIS — N1832 Chronic kidney disease, stage 3b: Secondary | ICD-10-CM | POA: Diagnosis not present

## 2023-07-13 DIAGNOSIS — N189 Chronic kidney disease, unspecified: Secondary | ICD-10-CM | POA: Diagnosis not present

## 2023-07-15 DIAGNOSIS — I480 Paroxysmal atrial fibrillation: Secondary | ICD-10-CM | POA: Diagnosis not present

## 2023-07-15 DIAGNOSIS — F331 Major depressive disorder, recurrent, moderate: Secondary | ICD-10-CM | POA: Diagnosis not present

## 2023-07-15 DIAGNOSIS — E1129 Type 2 diabetes mellitus with other diabetic kidney complication: Secondary | ICD-10-CM | POA: Diagnosis not present

## 2023-07-15 DIAGNOSIS — N401 Enlarged prostate with lower urinary tract symptoms: Secondary | ICD-10-CM | POA: Diagnosis not present

## 2023-07-15 DIAGNOSIS — N1832 Chronic kidney disease, stage 3b: Secondary | ICD-10-CM | POA: Diagnosis not present

## 2023-07-15 DIAGNOSIS — K219 Gastro-esophageal reflux disease without esophagitis: Secondary | ICD-10-CM | POA: Diagnosis not present

## 2023-07-15 DIAGNOSIS — R809 Proteinuria, unspecified: Secondary | ICD-10-CM | POA: Diagnosis not present

## 2023-07-15 DIAGNOSIS — E782 Mixed hyperlipidemia: Secondary | ICD-10-CM | POA: Diagnosis not present

## 2023-07-15 DIAGNOSIS — I10 Essential (primary) hypertension: Secondary | ICD-10-CM | POA: Diagnosis not present

## 2023-07-15 DIAGNOSIS — Z794 Long term (current) use of insulin: Secondary | ICD-10-CM | POA: Diagnosis not present

## 2023-07-15 DIAGNOSIS — I5022 Chronic systolic (congestive) heart failure: Secondary | ICD-10-CM | POA: Diagnosis not present

## 2023-10-21 ENCOUNTER — Encounter: Payer: Self-pay | Admitting: Urology

## 2023-11-04 ENCOUNTER — Ambulatory Visit: Payer: Medicare Other | Admitting: Urology

## 2023-11-12 ENCOUNTER — Other Ambulatory Visit: Payer: Self-pay | Admitting: Urology

## 2023-12-02 ENCOUNTER — Ambulatory Visit: Payer: Medicare Other | Admitting: Urology

## 2024-01-05 DIAGNOSIS — E119 Type 2 diabetes mellitus without complications: Secondary | ICD-10-CM | POA: Diagnosis not present

## 2024-01-05 DIAGNOSIS — Z961 Presence of intraocular lens: Secondary | ICD-10-CM | POA: Diagnosis not present

## 2024-01-05 DIAGNOSIS — H43813 Vitreous degeneration, bilateral: Secondary | ICD-10-CM | POA: Diagnosis not present

## 2024-01-05 DIAGNOSIS — H1013 Acute atopic conjunctivitis, bilateral: Secondary | ICD-10-CM | POA: Diagnosis not present

## 2024-01-06 ENCOUNTER — Ambulatory Visit: Admitting: Urology

## 2024-01-06 ENCOUNTER — Encounter: Payer: Self-pay | Admitting: Urology

## 2024-01-06 VITALS — BP 91/55 | HR 71 | Ht 69.0 in | Wt 184.1 lb

## 2024-01-06 DIAGNOSIS — N5201 Erectile dysfunction due to arterial insufficiency: Secondary | ICD-10-CM

## 2024-01-06 DIAGNOSIS — N401 Enlarged prostate with lower urinary tract symptoms: Secondary | ICD-10-CM

## 2024-01-06 LAB — URINALYSIS, COMPLETE
Bilirubin, UA: NEGATIVE
Ketones, UA: NEGATIVE
Leukocytes,UA: NEGATIVE
Nitrite, UA: NEGATIVE
Protein,UA: NEGATIVE
RBC, UA: NEGATIVE
Specific Gravity, UA: 1.01 (ref 1.005–1.030)
Urobilinogen, Ur: 0.2 mg/dL (ref 0.2–1.0)
pH, UA: 5.5 (ref 5.0–7.5)

## 2024-01-06 LAB — MICROSCOPIC EXAMINATION: Bacteria, UA: NONE SEEN

## 2024-01-06 LAB — BLADDER SCAN AMB NON-IMAGING

## 2024-01-06 MED ORDER — TAMSULOSIN HCL 0.4 MG PO CAPS: ORAL_CAPSULE | ORAL | 2 refills | Status: AC

## 2024-01-06 MED ORDER — FINASTERIDE 5 MG PO TABS: 5.0000 mg | ORAL_TABLET | Freq: Every day | ORAL | 3 refills | Status: AC

## 2024-01-06 MED ORDER — MIRABEGRON ER 50 MG PO TB24: 50.0000 mg | ORAL_TABLET | Freq: Every day | ORAL | 3 refills | Status: AC

## 2024-01-06 NOTE — Progress Notes (Signed)
 I, Maysun Jamey Mccallum, acting as a scribe for Geraline Knapp, MD., have documented all relevant documentation on the behalf of Geraline Knapp, MD, as directed by Geraline Knapp, MD while in the presence of Geraline Knapp, MD.  01/06/2024 7:03 PM   Tom Moore 01-07-1946 [id number]  Referring provider: Mimi Alt, MD [address] [place],  [place] [address]  Chief Complaint  Patient presents with   Benign Prostatic Hypertrophy   Urologic history:  1.  BPH with LUTS Tamsulosin   2.  Nocturia Previous treatments Nocdurna and IR oxybutynin at bedtime  HPI: Tom Moore is a 78 y.o. male presents for annual follow-up.  Remains on tamsulosin, Myrbetriq, and finasteride. Still having some urinary hesitancy and notes occasional dampness in his underwear.  IPSS today 11/35 with most bothersome symptom urgency and intermittent urinary stream. Denies dysuria or gross hematuria.  No flank, abdominal, or pelvic pain.  He states sildenafil has not been effective at the 100 mg dose.   PMH: Past Medical History:  Diagnosis Date   Anxiety    Arthritis    CHF (congestive heart failure) (HCC)    Depression    Diabetes mellitus without complication (HCC)    Dysrhythmia    atrial fib   GERD (gastroesophageal reflux disease)    Hyperlipidemia    Hypertension    Hyperthyroidism    Insomnia    Neuromuscular disorder (HCC)    diabetic neuropathy   Orthopnea    uses extra pillow to sleep   Sleep apnea    unable to tolerater CPAP   Wears dentures    Full upper, partial lower    Surgical History: Past Surgical History:  Procedure Laterality Date   CARDIAC CATHETERIZATION     CARPAL TUNNEL RELEASE Right 04/10/2018   Procedure: RELEASE MEDIAN NERVE AT CARPAL TUNNEL;  Surgeon: Marlynn Singer, MD;  Location: [facility];  Service: Orthopedics;  Laterality: Right;   CARPAL TUNNEL RELEASE Left 05/08/2018   Procedure: CARPAL TUNNEL RELEASE;  Surgeon:  Marlynn Singer, MD;  Location: [facility];  Service: Orthopedics;  Laterality: Left;   CATARACT EXTRACTION W/PHACO Right 08/16/2019   Procedure: CATARACT EXTRACTION PHACO AND INTRAOCULAR LENS PLACEMENT (IOC) RIGHT;  Surgeon: Ola Berger, MD;  Location: [facility];  Service: Ophthalmology;  Laterality: Right;  US  01:30.3 CDE 13.71 Fluid Pack lot # [id number]   CATARACT EXTRACTION W/PHACO Left 07/22/2021   Procedure: CATARACT EXTRACTION PHACO AND INTRAOCULAR LENS PLACEMENT (IOC) LEFT DIABETIC 5.29 01:01.6;  Surgeon: Annell Kidney, MD;  Location: [facility];  Service: Ophthalmology;  Laterality: Left;  Diabetic   COLONOSCOPY WITH PROPOFOL N/A 06/04/2020   Procedure: COLONOSCOPY WITH PROPOFOL;  Surgeon: Irby Mannan, MD;  Location: [facility];  Service: Endoscopy;  Laterality: N/A;   ESOPHAGOGASTRODUODENOSCOPY (EGD) WITH PROPOFOL N/A 06/04/2020   Procedure: ESOPHAGOGASTRODUODENOSCOPY (EGD) WITH PROPOFOL;  Surgeon: Irby Mannan, MD;  Location: [facility];  Service: Endoscopy;  Laterality: N/A;   TONSILLECTOMY      Home Medications:  Allergies as of 01/06/2024       Reactions   Bupropion Other (See Comments)   Tremors, balance issues, cognitive impairment   Zyrtec Allergy [cetirizine] Diarrhea        Medication List        Accurate as of January 06, 2024  7:03 PM. If you have any questions, ask your nurse or doctor.          STOP taking these medications  omeprazole 40 MG capsule Commonly known as: PRILOSEC Stopped by: Geraline Knapp   spironolactone 25 MG tablet Commonly known as: ALDACTONE Stopped by: Treyana Sturgell C [place] Grygiel   sucralfate 1 g tablet Commonly known as: CARAFATE Stopped by: Geraline Knapp   warfarin 5 MG tablet Commonly known as: COUMADIN Stopped by: Amiyah Shryock C Lain Tetterton       TAKE these medications    ALPRAZolam 0.5 MG tablet Commonly known as: XANAX Take 1 tablet (0.5 mg total) by mouth 3 (three) times daily as needed for  anxiety.   atorvastatin 40 MG tablet Commonly known as: LIPITOR Take 40 mg by mouth daily.   busPIRone 7.5 MG tablet Commonly known as: BUSPAR Take 1 tablet (7.5 mg total) by mouth 2 (two) times daily.   canagliflozin 300 MG Tabs tablet Commonly known as: INVOKANA Take 1 tablet (300 mg total) by mouth daily before breakfast.   diltiazem 120 MG 24 hr capsule Commonly known as: CARDIZEM CD Take 1 capsule (120 mg total) by mouth daily.   EQ Allergy Relief (Cetirizine) 10 MG tablet Generic drug: cetirizine Take 1 tablet by mouth once daily   Farxiga 10 MG Tabs tablet Generic drug: dapagliflozin propanediol Take 10 mg by mouth daily.   finasteride 5 MG tablet Commonly known as: PROSCAR Take 1 tablet (5 mg total) by mouth daily.   FLUoxetine 40 MG capsule Commonly known as: PROZAC Take by mouth.   furosemide 40 MG tablet Commonly known as: LASIX Take 1 tablet (40 mg total) by mouth 2 (two) times daily.   Lantus SoloStar 100 UNIT/ML Solostar Pen Generic drug: insulin glargine Inject 50 Units into the skin daily.   losartan 25 MG tablet Commonly known as: COZAAR Take 25 mg by mouth daily.   metFORMIN 1000 MG tablet Commonly known as: GLUCOPHAGE Take 1,000 mg by mouth daily with breakfast.   methimazole 5 MG tablet Commonly known as: TAPAZOLE Take 2.5 tablets by mouth daily.   metoprolol succinate 100 MG 24 hr tablet Commonly known as: TOPROL-XL metoprolol succinate ER 100 mg tablet,extended release 24 hr   metoprolol tartrate 25 MG tablet Commonly known as: LOPRESSOR Take 25 mg by mouth 2 (two) times daily.   midodrine 2.5 MG tablet Commonly known as: PROAMATINE Take 1 tablet (2.5 mg total) by mouth 3 (three) times daily.   mirabegron ER 50 MG Tb24 tablet Commonly known as: MYRBETRIQ Take 1 tablet (50 mg total) by mouth daily.   multivitamin with minerals Tabs tablet Take 1 tablet by mouth daily.   OneTouch Ultra test strip Generic drug: glucose  blood USE 1 STRIP TO CHECK GLUCOSE TWICE DAILY   OT ULTRA/FASTTK CNTRL SOLN Soln   Pacerone 200 MG tablet Generic drug: amiodarone Take 200 mg by mouth daily.   tadalafil 20 MG tablet Commonly known as: CIALIS TAKE 1 TABLET BY MOUTH ONE HOUR PRIOR TO INTERCOURSE   tamsulosin 0.4 MG Caps capsule Commonly known as: FLOMAX TAKE 2 CAPSULES BY MOUTH ONCE DAILY WITH LUNCH   Trulicity 1.5 MG/0.5ML Soaj Generic drug: Dulaglutide Inject 1.5 mg into the skin once a week. Mondays        Allergies:  Allergies  Allergen Reactions   Bupropion Other (See Comments)    Tremors, balance issues, cognitive impairment   Zyrtec Allergy [Cetirizine] Diarrhea    Family History: Family History  Problem Relation Age of Onset   Hypertension Mother    Heart disease Mother    Mental illness Neg Hx  Social History:  reports that he quit smoking about 35 years ago. His smoking use included cigarettes. He has never used smokeless tobacco. He reports that he does not currently use alcohol. He reports that he does not use drugs.   Physical Exam: BP (!) 91/55   Pulse 71   Ht 5\' 9"  (1.753 m)   Wt 184 lb 1.6 oz (83.5 kg)   BMI 27.19 kg/m   Constitutional:  Alert and oriented, No acute distress. HEENT: [place] AT Respiratory: Normal respiratory effort, no increased work of breathing. Psychiatric: Normal mood and affect.   Urinalysis Dipstick/microscopy negative.   Assessment & Plan:    1. BPH with LUTS Still having some moderate lower urinary tract symptoms, however he is on maximum medical management with finasteride, tamsulosin and Myrbetriq.  He is currently not interested in surgical options.   2. Erectile dysfunction PDE5 inhibitor refractory We discussed vacuum erection devices and intracavernosal injection and he was given literature.  He lives in [place] and comes back to the [place] in the spring for his medical appointments, and will schedule a 1 year follow-up.  I have reviewed  the above documentation for accuracy and completeness, and I agree with the above.   Geraline Knapp, MD  [facility] [address], [address] [place], [place] [address] [phone]

## 2024-01-09 DIAGNOSIS — R2689 Other abnormalities of gait and mobility: Secondary | ICD-10-CM | POA: Diagnosis not present

## 2024-01-09 DIAGNOSIS — E782 Mixed hyperlipidemia: Secondary | ICD-10-CM | POA: Diagnosis not present

## 2024-01-09 DIAGNOSIS — K30 Functional dyspepsia: Secondary | ICD-10-CM | POA: Diagnosis not present

## 2024-01-09 DIAGNOSIS — I480 Paroxysmal atrial fibrillation: Secondary | ICD-10-CM | POA: Diagnosis not present

## 2024-01-09 DIAGNOSIS — I10 Essential (primary) hypertension: Secondary | ICD-10-CM | POA: Diagnosis not present

## 2024-01-09 DIAGNOSIS — G4733 Obstructive sleep apnea (adult) (pediatric): Secondary | ICD-10-CM | POA: Diagnosis not present

## 2024-01-09 DIAGNOSIS — I739 Peripheral vascular disease, unspecified: Secondary | ICD-10-CM | POA: Diagnosis not present

## 2024-01-09 DIAGNOSIS — K219 Gastro-esophageal reflux disease without esophagitis: Secondary | ICD-10-CM | POA: Diagnosis not present

## 2024-01-09 DIAGNOSIS — H9203 Otalgia, bilateral: Secondary | ICD-10-CM | POA: Diagnosis not present

## 2024-01-09 DIAGNOSIS — E1122 Type 2 diabetes mellitus with diabetic chronic kidney disease: Secondary | ICD-10-CM | POA: Diagnosis not present

## 2024-01-09 DIAGNOSIS — G3184 Mild cognitive impairment, so stated: Secondary | ICD-10-CM | POA: Diagnosis not present

## 2024-01-09 DIAGNOSIS — E663 Overweight: Secondary | ICD-10-CM | POA: Diagnosis not present

## 2024-01-09 DIAGNOSIS — I5022 Chronic systolic (congestive) heart failure: Secondary | ICD-10-CM | POA: Diagnosis not present

## 2024-01-09 DIAGNOSIS — G5793 Unspecified mononeuropathy of bilateral lower limbs: Secondary | ICD-10-CM | POA: Diagnosis not present

## 2024-01-09 DIAGNOSIS — N1832 Chronic kidney disease, stage 3b: Secondary | ICD-10-CM | POA: Diagnosis not present

## 2024-01-16 DIAGNOSIS — R809 Proteinuria, unspecified: Secondary | ICD-10-CM | POA: Diagnosis not present

## 2024-01-16 DIAGNOSIS — I10 Essential (primary) hypertension: Secondary | ICD-10-CM | POA: Diagnosis not present

## 2024-01-16 DIAGNOSIS — Z794 Long term (current) use of insulin: Secondary | ICD-10-CM | POA: Diagnosis not present

## 2024-01-16 DIAGNOSIS — I5022 Chronic systolic (congestive) heart failure: Secondary | ICD-10-CM | POA: Diagnosis not present

## 2024-01-16 DIAGNOSIS — F331 Major depressive disorder, recurrent, moderate: Secondary | ICD-10-CM | POA: Diagnosis not present

## 2024-01-16 DIAGNOSIS — E782 Mixed hyperlipidemia: Secondary | ICD-10-CM | POA: Diagnosis not present

## 2024-01-16 DIAGNOSIS — N1832 Chronic kidney disease, stage 3b: Secondary | ICD-10-CM | POA: Diagnosis not present

## 2024-01-16 DIAGNOSIS — Z0000 Encounter for general adult medical examination without abnormal findings: Secondary | ICD-10-CM | POA: Diagnosis not present

## 2024-01-16 DIAGNOSIS — N401 Enlarged prostate with lower urinary tract symptoms: Secondary | ICD-10-CM | POA: Diagnosis not present

## 2024-01-16 DIAGNOSIS — E1129 Type 2 diabetes mellitus with other diabetic kidney complication: Secondary | ICD-10-CM | POA: Diagnosis not present

## 2024-01-16 DIAGNOSIS — F32A Depression, unspecified: Secondary | ICD-10-CM | POA: Diagnosis not present

## 2024-01-16 DIAGNOSIS — I480 Paroxysmal atrial fibrillation: Secondary | ICD-10-CM | POA: Diagnosis not present

## 2024-01-23 DIAGNOSIS — N1832 Chronic kidney disease, stage 3b: Secondary | ICD-10-CM | POA: Diagnosis not present

## 2024-01-23 DIAGNOSIS — R809 Proteinuria, unspecified: Secondary | ICD-10-CM | POA: Diagnosis not present

## 2024-01-23 DIAGNOSIS — Z794 Long term (current) use of insulin: Secondary | ICD-10-CM | POA: Diagnosis not present

## 2024-01-23 DIAGNOSIS — E1122 Type 2 diabetes mellitus with diabetic chronic kidney disease: Secondary | ICD-10-CM | POA: Diagnosis not present

## 2024-01-23 DIAGNOSIS — Z125 Encounter for screening for malignant neoplasm of prostate: Secondary | ICD-10-CM | POA: Diagnosis not present

## 2024-01-23 DIAGNOSIS — E0590 Thyrotoxicosis, unspecified without thyrotoxic crisis or storm: Secondary | ICD-10-CM | POA: Diagnosis not present

## 2024-01-23 DIAGNOSIS — E1129 Type 2 diabetes mellitus with other diabetic kidney complication: Secondary | ICD-10-CM | POA: Diagnosis not present

## 2024-06-26 DIAGNOSIS — I429 Cardiomyopathy, unspecified: Secondary | ICD-10-CM | POA: Diagnosis not present

## 2024-06-26 DIAGNOSIS — K219 Gastro-esophageal reflux disease without esophagitis: Secondary | ICD-10-CM | POA: Diagnosis not present

## 2024-06-26 DIAGNOSIS — R001 Bradycardia, unspecified: Secondary | ICD-10-CM | POA: Diagnosis not present

## 2024-06-26 DIAGNOSIS — I10 Essential (primary) hypertension: Secondary | ICD-10-CM | POA: Diagnosis not present

## 2024-06-26 DIAGNOSIS — N1832 Chronic kidney disease, stage 3b: Secondary | ICD-10-CM | POA: Diagnosis not present

## 2024-06-26 DIAGNOSIS — E782 Mixed hyperlipidemia: Secondary | ICD-10-CM | POA: Diagnosis not present

## 2024-06-26 DIAGNOSIS — I480 Paroxysmal atrial fibrillation: Secondary | ICD-10-CM | POA: Diagnosis not present

## 2024-06-26 DIAGNOSIS — I5022 Chronic systolic (congestive) heart failure: Secondary | ICD-10-CM | POA: Diagnosis not present

## 2024-06-26 DIAGNOSIS — E1122 Type 2 diabetes mellitus with diabetic chronic kidney disease: Secondary | ICD-10-CM | POA: Diagnosis not present

## 2024-06-26 DIAGNOSIS — E663 Overweight: Secondary | ICD-10-CM | POA: Diagnosis not present

## 2024-06-26 DIAGNOSIS — G4733 Obstructive sleep apnea (adult) (pediatric): Secondary | ICD-10-CM | POA: Diagnosis not present

## 2024-06-26 DIAGNOSIS — N183 Chronic kidney disease, stage 3 unspecified: Secondary | ICD-10-CM | POA: Diagnosis not present

## 2024-06-28 DIAGNOSIS — N1832 Chronic kidney disease, stage 3b: Secondary | ICD-10-CM | POA: Diagnosis not present

## 2024-06-28 DIAGNOSIS — E1129 Type 2 diabetes mellitus with other diabetic kidney complication: Secondary | ICD-10-CM | POA: Diagnosis not present

## 2024-06-28 DIAGNOSIS — R809 Proteinuria, unspecified: Secondary | ICD-10-CM | POA: Diagnosis not present

## 2024-06-28 DIAGNOSIS — E0590 Thyrotoxicosis, unspecified without thyrotoxic crisis or storm: Secondary | ICD-10-CM | POA: Diagnosis not present

## 2024-06-28 DIAGNOSIS — Z794 Long term (current) use of insulin: Secondary | ICD-10-CM | POA: Diagnosis not present

## 2024-06-28 DIAGNOSIS — E1122 Type 2 diabetes mellitus with diabetic chronic kidney disease: Secondary | ICD-10-CM | POA: Diagnosis not present

## 2024-07-04 DIAGNOSIS — N1832 Chronic kidney disease, stage 3b: Secondary | ICD-10-CM | POA: Diagnosis not present

## 2024-07-04 DIAGNOSIS — I10 Essential (primary) hypertension: Secondary | ICD-10-CM | POA: Diagnosis not present

## 2024-07-04 DIAGNOSIS — D631 Anemia in chronic kidney disease: Secondary | ICD-10-CM | POA: Diagnosis not present

## 2024-07-04 DIAGNOSIS — Z794 Long term (current) use of insulin: Secondary | ICD-10-CM | POA: Diagnosis not present

## 2024-07-04 DIAGNOSIS — E1122 Type 2 diabetes mellitus with diabetic chronic kidney disease: Secondary | ICD-10-CM | POA: Diagnosis not present

## 2024-07-06 DIAGNOSIS — R809 Proteinuria, unspecified: Secondary | ICD-10-CM | POA: Diagnosis not present

## 2024-07-06 DIAGNOSIS — E1129 Type 2 diabetes mellitus with other diabetic kidney complication: Secondary | ICD-10-CM | POA: Diagnosis not present

## 2024-07-06 DIAGNOSIS — I10 Essential (primary) hypertension: Secondary | ICD-10-CM | POA: Diagnosis not present

## 2024-07-06 DIAGNOSIS — N1832 Chronic kidney disease, stage 3b: Secondary | ICD-10-CM | POA: Diagnosis not present

## 2024-07-06 DIAGNOSIS — E782 Mixed hyperlipidemia: Secondary | ICD-10-CM | POA: Diagnosis not present

## 2024-07-06 DIAGNOSIS — Z794 Long term (current) use of insulin: Secondary | ICD-10-CM | POA: Diagnosis not present

## 2024-07-10 DIAGNOSIS — I480 Paroxysmal atrial fibrillation: Secondary | ICD-10-CM | POA: Diagnosis not present

## 2024-07-10 DIAGNOSIS — E782 Mixed hyperlipidemia: Secondary | ICD-10-CM | POA: Diagnosis not present

## 2024-07-10 DIAGNOSIS — E0590 Thyrotoxicosis, unspecified without thyrotoxic crisis or storm: Secondary | ICD-10-CM | POA: Diagnosis not present

## 2024-07-10 DIAGNOSIS — E1129 Type 2 diabetes mellitus with other diabetic kidney complication: Secondary | ICD-10-CM | POA: Diagnosis not present

## 2024-07-10 DIAGNOSIS — R809 Proteinuria, unspecified: Secondary | ICD-10-CM | POA: Diagnosis not present

## 2024-07-10 DIAGNOSIS — I10 Essential (primary) hypertension: Secondary | ICD-10-CM | POA: Diagnosis not present

## 2024-07-10 DIAGNOSIS — Z2821 Immunization not carried out because of patient refusal: Secondary | ICD-10-CM | POA: Diagnosis not present

## 2024-07-10 DIAGNOSIS — Z23 Encounter for immunization: Secondary | ICD-10-CM | POA: Diagnosis not present

## 2024-07-10 DIAGNOSIS — N401 Enlarged prostate with lower urinary tract symptoms: Secondary | ICD-10-CM | POA: Diagnosis not present

## 2024-07-10 DIAGNOSIS — Z1331 Encounter for screening for depression: Secondary | ICD-10-CM | POA: Diagnosis not present

## 2024-07-10 DIAGNOSIS — I5022 Chronic systolic (congestive) heart failure: Secondary | ICD-10-CM | POA: Diagnosis not present

## 2024-07-10 DIAGNOSIS — N1832 Chronic kidney disease, stage 3b: Secondary | ICD-10-CM | POA: Diagnosis not present

## 2025-01-04 ENCOUNTER — Ambulatory Visit: Admitting: Urology
